# Patient Record
Sex: Male | Born: 1935 | Race: White | Hispanic: No | Marital: Married | Smoking: Former smoker
Health system: Southern US, Community
[De-identification: ages and names within clinical notes are randomized; demographics above are authoritative.]

## PROBLEM LIST (undated history)

## (undated) ENCOUNTER — Inpatient Hospital Stay: Admission: EM | Payer: Self-pay | Source: Home / Self Care

## (undated) DIAGNOSIS — I2510 Atherosclerotic heart disease of native coronary artery without angina pectoris: Secondary | ICD-10-CM

## (undated) DIAGNOSIS — I4891 Unspecified atrial fibrillation: Secondary | ICD-10-CM

## (undated) DIAGNOSIS — IMO0002 Reserved for concepts with insufficient information to code with codable children: Secondary | ICD-10-CM

## (undated) DIAGNOSIS — I10 Essential (primary) hypertension: Secondary | ICD-10-CM

## (undated) DIAGNOSIS — E785 Hyperlipidemia, unspecified: Secondary | ICD-10-CM

## (undated) HISTORY — DX: Hyperlipidemia, unspecified: E78.5

## (undated) HISTORY — PX: TONSILLECTOMY AND ADENOIDECTOMY: SUR1326

## (undated) HISTORY — DX: Atherosclerotic heart disease of native coronary artery without angina pectoris: I25.10

## (undated) HISTORY — PX: BACK SURGERY: SHX140

## (undated) HISTORY — DX: Essential (primary) hypertension: I10

## (undated) HISTORY — PX: OTHER SURGICAL HISTORY: SHX169

## (undated) HISTORY — PX: APPENDECTOMY: SHX54

## (undated) HISTORY — DX: Reserved for concepts with insufficient information to code with codable children: IMO0002

---

## 1998-03-30 ENCOUNTER — Ambulatory Visit (HOSPITAL_COMMUNITY): Admission: RE | Admit: 1998-03-30 | Discharge: 1998-03-30 | Payer: Self-pay | Admitting: Internal Medicine

## 1999-07-29 ENCOUNTER — Encounter: Admission: RE | Admit: 1999-07-29 | Discharge: 1999-07-29 | Payer: Self-pay | Admitting: Internal Medicine

## 1999-07-29 ENCOUNTER — Encounter: Payer: Self-pay | Admitting: Internal Medicine

## 1999-09-25 HISTORY — PX: CORONARY ARTERY BYPASS GRAFT: SHX141

## 2000-02-06 ENCOUNTER — Ambulatory Visit (HOSPITAL_COMMUNITY): Admission: RE | Admit: 2000-02-06 | Discharge: 2000-02-06 | Payer: Self-pay | Admitting: Cardiology

## 2000-02-16 ENCOUNTER — Encounter: Payer: Self-pay | Admitting: Thoracic Surgery (Cardiothoracic Vascular Surgery)

## 2000-02-20 ENCOUNTER — Encounter: Payer: Self-pay | Admitting: Thoracic Surgery (Cardiothoracic Vascular Surgery)

## 2000-02-20 ENCOUNTER — Inpatient Hospital Stay (HOSPITAL_COMMUNITY)
Admission: RE | Admit: 2000-02-20 | Discharge: 2000-02-25 | Payer: Self-pay | Admitting: Thoracic Surgery (Cardiothoracic Vascular Surgery)

## 2000-02-21 ENCOUNTER — Encounter: Payer: Self-pay | Admitting: Thoracic Surgery (Cardiothoracic Vascular Surgery)

## 2000-02-22 ENCOUNTER — Encounter: Payer: Self-pay | Admitting: Thoracic Surgery (Cardiothoracic Vascular Surgery)

## 2000-02-24 ENCOUNTER — Encounter: Payer: Self-pay | Admitting: Thoracic Surgery (Cardiothoracic Vascular Surgery)

## 2002-04-24 ENCOUNTER — Ambulatory Visit (HOSPITAL_COMMUNITY): Admission: RE | Admit: 2002-04-24 | Discharge: 2002-04-24 | Payer: Self-pay | Admitting: *Deleted

## 2002-08-23 ENCOUNTER — Emergency Department (HOSPITAL_COMMUNITY): Admission: EM | Admit: 2002-08-23 | Discharge: 2002-08-23 | Payer: Self-pay | Admitting: Emergency Medicine

## 2002-09-14 ENCOUNTER — Encounter: Payer: Self-pay | Admitting: Internal Medicine

## 2002-09-14 ENCOUNTER — Encounter: Admission: RE | Admit: 2002-09-14 | Discharge: 2002-09-14 | Payer: Self-pay | Admitting: Internal Medicine

## 2002-11-13 ENCOUNTER — Encounter: Payer: Self-pay | Admitting: Neurosurgery

## 2002-11-16 ENCOUNTER — Encounter: Payer: Self-pay | Admitting: Neurosurgery

## 2002-11-16 ENCOUNTER — Inpatient Hospital Stay (HOSPITAL_COMMUNITY): Admission: RE | Admit: 2002-11-16 | Discharge: 2002-11-17 | Payer: Self-pay | Admitting: Neurosurgery

## 2005-09-04 ENCOUNTER — Encounter: Payer: Self-pay | Admitting: Cardiovascular Disease

## 2008-12-23 ENCOUNTER — Encounter: Admission: RE | Admit: 2008-12-23 | Discharge: 2009-01-25 | Payer: Self-pay | Admitting: Rheumatology

## 2009-01-21 ENCOUNTER — Inpatient Hospital Stay (HOSPITAL_COMMUNITY): Admission: AD | Admit: 2009-01-21 | Discharge: 2009-01-23 | Payer: Self-pay | Admitting: Internal Medicine

## 2009-06-20 ENCOUNTER — Encounter: Payer: Self-pay | Admitting: Cardiovascular Disease

## 2010-10-18 ENCOUNTER — Ambulatory Visit
Admission: RE | Admit: 2010-10-18 | Discharge: 2010-10-18 | Payer: Self-pay | Source: Home / Self Care | Attending: Cardiovascular Disease | Admitting: Cardiovascular Disease

## 2010-10-18 ENCOUNTER — Encounter: Payer: Self-pay | Admitting: Cardiovascular Disease

## 2010-10-18 DIAGNOSIS — I25810 Atherosclerosis of coronary artery bypass graft(s) without angina pectoris: Secondary | ICD-10-CM | POA: Insufficient documentation

## 2010-10-18 DIAGNOSIS — E785 Hyperlipidemia, unspecified: Secondary | ICD-10-CM | POA: Insufficient documentation

## 2010-10-18 DIAGNOSIS — I10 Essential (primary) hypertension: Secondary | ICD-10-CM | POA: Insufficient documentation

## 2010-10-26 NOTE — Assessment & Plan Note (Signed)
Summary: np6/previous pt of Dr. Aleen Campi   Visit Type:  Initial Consult Primary Provider:  Dr. Pearson Grippe  CC:  Previous pt. of Dr. Aleen Campi- Cardiology evaluation.  History of Present Illness: 77 year-old male with CAD s/p CABG in 2001. The patient is presenting for initial evaluation. He is former pt of Dr Aleen Campi but hasn't been seen in several years.   He was initially diagnosed with CAD when he presented with crescendo angina, first noted when he was hunting. He had an abnormal stress test followed by catheterization showing severe multivessel disease. He underwent 4 vessel CABG in 2001 and has not further ischemic events.   He is inactive, but still enjoys going hunting and fishing. He states many of the younger guys can't keep up with him on their hunting trips.  The patient denies chest pain, dyspnea, orthopnea, PND, edema, palpitations, lightheadedness, or syncope. He is compliant with his medical program as directed by Dr Selena Batten.   Current Medications (verified): 1)  Lovastatin 40 Mg Tabs (Lovastatin) .... Take One Tablet By Mouth Daily At Bedtime 2)  Metoprolol Succinate 50 Mg Xr24h-Tab (Metoprolol Succinate) .... Take 1/2 Tablet Two Times A Day 3)  Aspirin 81 Mg Tbec (Aspirin) .... Take One Tablet By Mouth Daily 4)  Losartan Potassium 50 Mg Tabs (Losartan Potassium) .... Take 1 Tablet By Mouth Once A Day 5)  Symbicort 160-4.5 Mcg/act Aero (Budesonide-Formoterol Fumarate) .... 2 Puffs Two Times A Day 6)  Vitamin B-12 1000 Mcg Tabs (Cyanocobalamin) .... Take 1 Tablet By Mouth Once A Day  Allergies (verified): No Known Drug Allergies  Past History:  Past medical, surgical, family and social histories (including risk factors) reviewed, and no changes noted (except as noted below).  Past Medical History: Past Medical History: Hypertension Hyperlipidemia Coronary artery disease s/p CABG Asthma  Cervical degenerative disk disease/spinal stenosis.   Past Surgical History:  1. CABG in 2001 after positive exercise treadmill test. LIMA-LAD, Radial artery to LCx marginal, SVG to second diagonal, SVG to PDA.   2. Bilateral inguinal hernia repair.   3. Back surgery for herniated disk.   4. T and A.   5. Appendectomy.   Family History: Reviewed history and no changes required. Father died at age 34 from heart attack.  His mother is   alive at age 50 and has dementia.  He has 1 brother and 1 sister, both  of whom are healthy.  He has 3 children.   Social History: Reviewed history and no changes required. The patient is married and worked for [employer].  He does not drink or smoke.   Review of Systems       Negative except as per HPI   Vital Signs:  Patient profile:   77 year old male Height:      70 inches Weight:      158 pounds BMI:     22.75 Pulse rate:   71 / minute Pulse rhythm:   regular Resp:     18 per minute BP sitting:   160 / 80  (left arm) Cuff size:   large  Vitals Entered By: Vikki Ports (October 18, 2010 2:28 PM)  Physical Exam  General:  Pt is well-developed, alert and oriented, no acute distress HEENT: normal Neck: no thyromegaly           JVP normal, carotid upstrokes normal without bruits Lungs: CTA Chest: equal expansion  CV: Apical impulse nondisplaced, RRR without murmur or gallop Abd: soft, NT,  positive BS, no HSM, no bruit Back: no CVA tenderness Ext: no clubbing, cyanosis, or edema        femoral pulses 2+ without bruits        pedal pulses 2+ and equal Skin: warm, dry, no rash Neuro: CNII-XII intact,strength 5/5 = b/l    Cardiac Cath  Procedure date:  02/06/2000  Findings:      Findings:      FINAL DIAGNOSES: 1. Three-vessel coronary artery disease with 95% proximal circumflex lesion,    a 90% mid left anterior descending artery lesion, a 90% distal right    coronary artery lesion in the first posterolateral branch to the left    ventricle. 2. Normal left internal mammary artery. 3.  Normal left ventricular function. 4. Normal mitral and aortic valves. 5. Normal abdominal aorta and renal arteries.  EKG  Procedure date:  10/18/2010  Findings:      NSR 71 bpm, within normal limits.  Impression & Recommendations:  Problem # 1:  CORONARY ATHEROSLERO AUTOL VEIN BYPASS GRAFT (ICD-414.02) Pt is stable without angina. Meds reviewed and are appropriate. He reports good BP control and states that he was anxious about being here for his first visit today. He has preserved LV function. Since he is 10 years removed from multivessel CABG and has a normal EKG, I ahve recommended an exercise treadmill stress test. Otherwise will see back in followup at one year intervals.  His updated medication list for this problem includes:    Metoprolol Succinate 50 Mg Xr24h-tab (Metoprolol succinate) .Marland Kitchen... Take 1/2 tablet two times a day    Aspirin 81 Mg Tbec (Aspirin) .Marland Kitchen... Take one tablet by mouth daily  Orders: EKG w/ Interpretation (93000) Treadmill (Treadmill)  Problem # 2:  HYPERTENSION, BENIGN (ICD-401.1) Discussion as above. No med changes today. Continue regular followup with Dr Selena Batten and meds can be titrated as needed.  His updated medication list for this problem includes:    Metoprolol Succinate 50 Mg Xr24h-tab (Metoprolol succinate) .Marland Kitchen... Take 1/2 tablet two times a day    Aspirin 81 Mg Tbec (Aspirin) .Marland Kitchen... Take one tablet by mouth daily    Losartan Potassium 50 Mg Tabs (Losartan potassium) .Marland Kitchen... Take 1 tablet by mouth once a day  BP today: 160/80  Problem # 3:  HYPERLIPIDEMIA-MIXED (ICD-272.4) Goal LDL less than 100 mg/dL and ideally less than 70.  His updated medication list for this problem includes:    Lovastatin 40 Mg Tabs (Lovastatin) .Marland Kitchen... Take one tablet by mouth daily at bedtime  Patient Instructions: 1)  Your physician recommends that you continue on your current medications as directed. Please refer to the Current Medication list given to you today. 2)  Your  physician wants you to follow-up in:  1 YEAR.  You will receive a reminder letter in the mail two months in advance. If you don't receive a letter, please call our office to schedule the follow-up appointment. 3)  Your physician has requested that you have an exercise tolerance test.  For further information please visit [email or web address]  Please also follow instruction sheet, as given.

## 2010-11-14 ENCOUNTER — Encounter: Payer: Self-pay | Admitting: Physician Assistant

## 2010-11-14 ENCOUNTER — Encounter (INDEPENDENT_AMBULATORY_CARE_PROVIDER_SITE_OTHER): Payer: Medicare Other | Admitting: Physician Assistant

## 2010-11-14 ENCOUNTER — Encounter (INDEPENDENT_AMBULATORY_CARE_PROVIDER_SITE_OTHER): Payer: Medicare Other

## 2010-11-14 DIAGNOSIS — I2510 Atherosclerotic heart disease of native coronary artery without angina pectoris: Secondary | ICD-10-CM

## 2010-11-14 DIAGNOSIS — R0989 Other specified symptoms and signs involving the circulatory and respiratory systems: Secondary | ICD-10-CM

## 2010-12-05 NOTE — Letter (Signed)
Summary: [facility] Treadmill Test   [facility] Treadmill [facility]   Imported By: Roderic Ovens 11/24/2010 10:35:42  _____________________________________________________________________  External Attachment:    Type:   Image     Comment:   External Document

## 2011-01-03 LAB — CBC
HCT: 38.9 % — ABNORMAL LOW (ref 39.0–52.0)
Hemoglobin: 13.6 g/dL (ref 13.0–17.0)
MCHC: 34.9 g/dL (ref 30.0–36.0)
MCV: 95.7 fL (ref 78.0–100.0)
Platelets: 230 10*3/uL (ref 150–400)
RBC: 4.06 MIL/uL — ABNORMAL LOW (ref 4.22–5.81)
RDW: 12.6 % (ref 11.5–15.5)
WBC: 11.8 10*3/uL — ABNORMAL HIGH (ref 4.0–10.5)

## 2011-01-03 LAB — COMPREHENSIVE METABOLIC PANEL
ALT: 26 U/L (ref 0–53)
AST: 21 U/L (ref 0–37)
Albumin: 3.3 g/dL — ABNORMAL LOW (ref 3.5–5.2)
Alkaline Phosphatase: 68 U/L (ref 39–117)
BUN: 20 mg/dL (ref 6–23)
CO2: 29 mEq/L (ref 19–32)
Calcium: 9.3 mg/dL (ref 8.4–10.5)
Chloride: 100 mEq/L (ref 96–112)
Creatinine, Ser: 1.14 mg/dL (ref 0.4–1.5)
GFR calc Af Amer: >60 mL/min (ref >60)
GFR calc non Af Amer: >60 mL/min (ref >60)
Glucose, Bld: 104 mg/dL — ABNORMAL HIGH (ref 70–99)
Potassium: 4.7 mEq/L (ref 3.5–5.1)
Sodium: 139 mEq/L (ref 135–145)
Total Bilirubin: 1.1 mg/dL (ref 0.3–1.2)
Total Protein: 6.8 g/dL (ref 6.0–8.3)

## 2011-01-03 LAB — WOUND CULTURE

## 2011-01-03 LAB — SEDIMENTATION RATE: Sed Rate: 88 mm/hr — ABNORMAL HIGH (ref 0–16)

## 2011-01-03 LAB — C-REACTIVE PROTEIN: CRP: 9.4 mg/dL — ABNORMAL HIGH (ref <0.6)

## 2011-02-06 NOTE — Discharge Summary (Signed)
Shawn Bauer, Shawn Bauer                ACCOUNT NO.:  [id number]   MEDICAL RECORD NO.:  [id number]          PATIENT TYPE:  INP   LOCATION:  5014                         FACILITY:  [facility]   PHYSICIAN:  Massie Maroon, MD        DATE OF BIRTH:  28-Dec-1933   DATE OF ADMISSION:  01/21/2009  DATE OF DISCHARGE:  01/23/2009                               DISCHARGE SUMMARY   DISCHARGE DIAGNOSES:  1. Upper lip abscess, staph aureus, cultures and sensitivity still      pending.  2. Hypertension.  3. Hyperlipidemia.  4. Coronary artery disease.  5. Asthma.   CONSULTATIONS:  Suzanna Obey, MD, ENT   PROCEDURES:  Incision and drainage of the upper lip in Dr. Jearld Fenton'  office, January 21, 2009.   HOSPITAL COURSE:  A 77 year old male with a history of asthma and CAD  complained of lip swelling starting on Friday.  It apparently started  out as a fever blister.  The patient recalled no odd foods eaten such as  shell fish, strawberries, no new medications such as a ACE inhibitors.  The patient denied any fever, chills, sinus pain, dental work, chest  pain, palpitations, cough, or shortness of breath.  The swelling which  started approximately January 14, 2009 was evaluated in the office on  January 19, 2009 and he was found to have swelling of his upper lip which  appeared possibly to have a slight abscess of the lip.  The patient was  started on Augmentin 875 mg p.o. b.i.d. and it did not improve.  So, he  was seen in the office again on January 21, 2009 and sent to ENT for  evaluation of possible upper lip abscess.  He had an I and D at Dr.  Jearld Fenton' office.  The abscess appeared suspicious for MRSA infection and  the patient was sent to the hospital for IV antibiotics.  The patient  was started on vancomycin as well as Zosyn intravenously and the redness  and swelling of the upper lip started to recede.  On the day of  discharge, it looked significantly better.  There was almost no erythema  and the swelling has  decreased by 75%.  The patient has not had any  fevers or chills.  His vital signs have been stable.  The patient's  wound culture is still pending, but because of his significant  improvement he will be discharged home on doxycycline 100 mg p.o. b.i.d.   PAST MEDICAL HISTORY:  1. Hypertension.  2. Hyperlipidemia.  3. CAD status post CABG.  4. Cervical degenerative disk disease/spinal stenosis.  5. Asthma.  6. History of bilateral cerumen impaction (recurrent).   PAST SURGICAL HISTORY:  1. CABG in 2001 after positive exercise treadmill test.  2. Bilateral inguinal hernia repair.  3. Back surgery for herniated disk.  4. T and A.  5. Appendectomy.   FAMILY HISTORY/SOCIAL HISTORY:  Please see H and P dictated on January 21, 2009.   REVIEW OF SYSTEMS:  Negative for all 10 organ systems except for  pertinent positives expressed above.  ALLERGIES:  No known drug allergies.   DISCHARGE MEDICATIONS:  1. Metoprolol 50 mg one-half b.i.d.  2. Lovastatin 40 mg 2 at bedtime.  3. Enteric-coated aspirin 81 mg p.o. daily.  4. Micardis 40 mg p.o. daily.  5. Singulair 10 mg p.o. daily.  6. Symbicort 160/4.5 two puffs b.i.d. p.r.n.   PHYSICAL EXAMINATION:  VITAL SIGNS:  Temperature 98.2, pulse 71, blood  pressure 121/77, and pulse ox 93% on room air.  HEENT:  Anicteric, EOMI, no nystagmus, pupils 1.5 mm, symmetric,  swelling of the upper lip has receded by about 75%.  The erythema is  almost completely resolved.  There is a scab on the lower part of the  upper lip from his incision and drainage.  Pain is minimal to none at  this time.  There is almost no tenderness.  NECK:  No JVD, no bruit, no adenopathy.  HEART:  Regular rate and rhythm.  S1-S2.  LUNGS:  Clear to auscultation bilaterally.  ABDOMEN:  Soft, nontender, nondistended, positive bowel sounds.  EXTREMITIES:  No cyanosis, clubbing, or edema.  SKIN:  See above HEENT, otherwise no rashes, lymph nodes, no cervical  adenopathy.   NEUROLOGIC:  Nonfocal.   LABORATORY DATA:  Wound culture January 21, 2009 shows moderate  Staphylococcus aureus, sensitivities are not back yet.  Labs January 21, 2009:  WBC 11.8 (elevated), hemoglobin 13.6, and platelet count 230.  Sodium 139, potassium 4.7, BUN 20, and creatinine 1.14.  AST 21, ALT 26,  ESR 88 (elevated), and CRP 9.4.   ASSESSMENT:  1. Upper lip abscess, probable methicillin-resistant Staphylococcus      aureus.  2. Hypertension.  3. Hyperlipidemia.  4. Coronary artery disease status post coronary artery bypass graft.  5. Asthma.   PLAN:  The patient will be discharged with doxycycline 100 mg p.o.  b.i.d. x1 week.  The patient will follow up with Dr. Jearld Fenton in 1 week.  He will follow up with Dr. Selena Batten in 1-2 weeks.  The patient is asked to  contact us in the office tomorrow for the results of his culture and  sensitivities.  We will adjust his antibiotics depending on the wound  culture.  The patient's upper lip abscess appears much improved.  We  appreciate Dr. Jearld Fenton' input on this case.      Massie Maroon, MD  Electronically Signed     JYK/MEDQ  D:  01/23/2009  T:  01/24/2009  Job:  [id number]   cc:   Suzanna Obey, M.D.

## 2011-02-06 NOTE — H&P (Signed)
NAMEJASTON, Shawn Bauer                ACCOUNT NO.:  [id number]   MEDICAL RECORD NO.:  [id number]          PATIENT TYPE:  INP   LOCATION:  5014                         FACILITY:  [facility]   PHYSICIAN:  Massie Maroon, MD        DATE OF BIRTH:  June 05, 1934   DATE OF ADMISSION:  01/21/2009  DATE OF DISCHARGE:                              HISTORY & PHYSICAL   CHIEF COMPLAINT:  Swollen lip.   HISTORY OF PRESENT ILLNESS:  A 77 year old male with a history of asthma  and CAD complains of lip swelling starting on Friday.  It started out as  a fever blister.  The patient recalls no odd foods or shellfish or  strawberries or no new medications such as ACE inhibitors.  The patient  denies any fever, chills, sinus pain, chest pain, palpitation, or  shortness of breath.  Little swelling started approximately April 23.  The patient was evaluated in the office on April 28 and was found to  have swelling of the upper lip and appeared possibly to have a slight  abscess in the lip.  The patient was started on Augmentin 875 mg p.o.  b.i.d. and told to contact his primary care physician if it did not  improve.  Today, the patient was seen in the office again because his  lip swelling was not improving.  There appeared to be a definite abscess  and it is suspicious for MRSA infection.  The patient was sent to ENT  for evaluation.  Apparently, the patient had an I and D in the office  after nerve block.  A culture was done in the office of his PCP.  The  patient will be sent for admission for IV antibiotics for possible MRSA  abscess of the upper lip.   PAST MEDICAL HISTORY:  1. Hypertension.  2. Hyperlipidemia.  3. CAD status post CABG.  4. Cervical degenerative disk disease/spinal stenosis.  5. Bilateral cerumen impaction (recurrent problem).  6. Asthma.   PAST SURGICAL HISTORY:  1. CABG in 2001 after positive exercise treadmill test.  2. Bilateral inguinal hernia repair.  3. Back surgery for herniated  disk.  4. T and A.  5. Appendectomy.   FAMILY HISTORY:  Father died at age 58 from heart attack.  His mother is  alive at age 20 and has dementia.  He has 1 brother and 1 sister, both  of whom are healthy.  He has 3 children.   SOCIAL HISTORY:  The patient is married and worked for [employer].  He does not drink or smoke.   ALLERGIES:  No known drug allergies.   MEDICATIONS:  1. Metoprolol 50 mg one-half b.i.d.  2. Lovastatin 40 mg 2 at bedtime.  3. Symbicort 160/4.5 two puffs q.a.m. p.r.n.  4. Enteric-coated aspirin 81 mg p.o. daily.  5. Micardis 40 mg p.o. daily.  6. Singulair 10 mg p.o. daily.   REVIEW OF SYSTEMS:  Negative for all 10-organ systems except for  pertinent positives stated above, no recent travel, no recent  hospitalization, no recent nursing home exposure or  increased risk for  MRSA infection.   PHYSICAL EXAMINATION:  VITAL SIGNS:  Weight 155 pounds, pulse 76, blood  pressure 110/68 in the office, and pulse oximetry 97% on room air.  HEENT:  Anicteric, EOMI, no nystagmus, pupils 1.5 mm, symmetric, direct,  consensual, near reflexes intact.  There is swelling of the upper lip  which appears to have slight abscess and maybe slightly larger than  before when the patient was seen on January 19, 2009.  It is draining  yellow discharge (this was the description in the office).  In the  hospital, the patient has obviously had incision and drainage with  packing of the upper lip.  NECK:  No JVD, no bruit, no adenopathy, no thyromegaly.  HEART:  Midline scar.  Regular rate and rhythm.  S1 and S2.  No murmurs,  gallops, or rubs.  LUNGS:  Clear to auscultation bilaterally.  ABDOMEN:  Soft, nontender, and nondistended.  Positive bowel sounds.  EXTREMITIES:  No cyanosis, clubbing, or edema.  SKIN:  No rashes.  NEURO:  No resting tremor.  Cranial nerves II through XII intact.  Reflexes 2+, symmetric, diffuse with downgoing toes bilaterally.  Motor  strength  5/5 in all 4 extremities.  Pinprick intact.   LABORATORY DATA:  Sodium 139, potassium 4.7, chloride 100, bicarb 29,  BUN 20, creatinine 1.14, AST 21, ALT 26, albumin 3.3, and calcium 9.3.  WBC 11.8, hemoglobin 13.6, and platelet count 230.   ASSESSMENT:  1. Upper lip abscess.  2. Hypertension.  3. Hyperlipidemia.  4. Coronary artery disease status post coronary artery bypass graft.  5. Asthma.   PLAN:  1. Lip abscess:  We appreciate out ENT colleagues input.  ENT consult      written for further evaluation and followup management of lipid      abscess.  The patient will be started on vancomycin as per pharmacy      protocol.  We will also cover with Zosyn 3.375 g IV q.6 h for now.      We will obtain another wound Gram stain and culture prior to      initiation of antibiotics.  The patient is afebrile and has minimal      white count, so we will not obtain blood cultures at this time.  He      does not appear to be septic.  A sed rate and CRP level are      obtained as well.  If the patient does not improve, he may need      further surgery as well as imaging.  2. CAD status post CABG:  The patient will continue on metoprolol 25      mg p.o. b.i.d. as well as enteric-coated aspirin 81 mg daily and      Micardis 40 mg p.o. daily.  For his cholesterol, he will continue      on lovastatin 80 mg at bedtime.  3. Asthma:  The patient will be continued on Symbicort 2 puffs b.i.d.      p.r.n. wheeze as well as Singulair 10 mg p.o. daily.  Albuterol neb      q.4 h p.r.n. wheezing or shortness of breath.  The patient had a      pulse oximetry that was noted to be 84% on room air apparently, but      I have rechecked this with a pulse oximeter manually myself      and his pulse oximetry on room air is 97%.  4. DVT prophylaxis:  The patient will be placed on SCDs and TEDs for      now in case further surgery is required tomorrow.      Massie Maroon, MD  Electronically Signed      JYK/MEDQ  D:  01/21/2009  T:  01/22/2009  Job:  [id number]   cc:   Massie Maroon, MD  Suzanna Obey, M.D.

## 2011-02-09 NOTE — Discharge Summary (Signed)
[facility]  Patient:    Shawn Bauer, Shawn Bauer                       MRN: [id number] Adm. Date:  [id number] Disc. Date: [id number] Attending:  Tressie Stalker Dictator:   Eugenia Pancoast, P.A. CC:         Shawn Bauer. Shawn Bauer., M.D.             Shawn Bauer, M.D.             Shawn Bauer, M.D.                           Discharge Summary  DATE OF BIRTH:  12/11/1933  FINAL DIAGNOSES: 1. Coronary artery disease. 2. Hypertension. 3. Asthma. 4. Degenerative disk disease. 5. History of stroke. 6. Postoperative dysrhythmia. 7. Hyperlipidemia.  PROCEDURES:  Feb 20, 2000, coronary artery bypass graft x 4 with the left internal mammary artery to the left anterior descending, left radial artery to the obtuse marginal branch and saphenous vein graft to the diagonal branch, saphenous vein graft to the posterior descending artery.  SURGEON:  Shawn Bauer, M.D.  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  He is followed by Dr. Dimas Alexandria and was referred by Dr. Charolette Child to Dr. Cornelius Bauer for surgical revascularization. Shawn Bauer has no previous history of coronary artery disease.  He does have risk factors including hypertension, previous stroke, and remote history of tobacco use.  Shawn Bauer describes a one-year history of progressive symptoms of exertional angina.  He notes occasional episodes of a tightness in his chest which initially were brought on with significant physical exertion.  His episodes of chest pain are usually relieved with rest and only last a few minutes at most.  He denies any history of prolonged episodes of chest pain. The episodes continued to increase in frequency and severity and a month prior to his admission, he suffered one such episode occurring at night while he was lying in bed.  He recently had antihypertensive medication readjusted by Dr.  Lendell Caprice and, since that time, the frequency of episodes of angina has decreased considerably.  Patient underwent an exercise treadmill by Dr. Lendell Caprice and was notably positive by report.  The details of these findings were not available at this time.  He was referred then to Dr. Aleen Bauer who performed elective cardiac catheterization on Feb 06, 2000. This demonstrates severe three-vessel coronary artery disease with preserved left ventricular function.  Specifically, there was a long 70% stenosis with eccentric 90% stenosis of the left anterior descending coronary artery in its mid portion involving the takeoff of the second diagonal branch.  The first diagonal branch was quite small but the second was a significant vessel and also had an 80% proximal stenosis.  There was a 95% proximal stenosis of the left circumflex coronary artery.  The right coronary artery was diffusely diseased but had no significant hemodynamic lesions until after his bifurcation.  There was a 90% proximal stenosis of the posterior descending coronary artery.  There was a right dominant coronary circulation.  Left ventricular function appeared well preserved with an ejection fraction estimated at 64%.  Because of these findings, he was referred to Dr. Cornelius Bauer who saw him in the office.  Dr. Cornelius Bauer discussed the surgery with the patient and risks and benefits of  the surgery were explained to the patient.  Patient understood and agreed to the surgery.  HOSPITAL COURSE:  On Feb 20, 2000, patient was admitted to [facility].  On day of admission, patient underwent coronary artery bypass x 4 with the LIMA to the LAD, left radial artery to the obtuse marginal branch, saphenous vein graft to the diagonal artery, and saphenous vein graft to the posterior descending artery.  Patient tolerated the procedure well.  No intraoperative complications occurred.  Postoperatively, patient did satisfactorily.  He was extubated on  the operative day.  On the first postoperative day, he was doing quite well, having no complaints.  He was in normal sinus rhythm.  His blood pressure was 130/60.  He was afebrile.  Vital signs were stable.  He continued to progress in a satisfactory manner.  On the first postoperative day, he was transferred to the step-down unit.  There, he continued to progress in a satisfactory manner.  He did have some nausea and was given Zofran for this and this resolved.  The patient did have an episode occurring on the third postoperative day at which time he had noted supraventricular tachycardia with frequent PACs.  This lasted only a short time.  But, because of this occurrence, he was started on digoxin.  This would be discontinued at time of discharge if rhythm remained stable which it has. Otherwise, no other untoward events occurred during his stay.  Prior to his admission, he did have a lipid profile which showed a cholesterol was elevated at 216, his triglycerides were 81, his HDL was 38, and his LDL cholesterol was 162.  Because of this, he was started on Lipitor 10 mg h.s.  His final lab work showed a sodium of 135, potassium 4.4, chloride 101, CO2 30, BUN 17, creatinine 0.8, glucose 165.  His calcium was 8.1 and his magnesium was 2.2. His white cell count was 13.4, his hemoglobin 9.3, hematocrit 26.3, and his platelet count 161.  His glucose was noted to be high.  He had no history of diabetes but patient will be followed for further tests of elevated glucose to rule out diabetes.  Patient was doing quite well and was subsequently prepared for discharge.  DISCHARGE MEDICATIONS: 1. Lipitor 10 mg h.s. 2. Cardizem CD 180 mg q.d. 3. Diovan 80 mg q.d. 4. Lopressor 50 mg one-half tablet q.12h. 5. Enteric-coated aspirin 325 mg q.d. 6. Serevent inhaler two puffs q.12h. 7. Ventolin inhaler two puffs b.i.d. 8. Singulair 10 mg q.d. 9. Percocet one to two p.o. q.4-6h. p.r.n.  pain.  FOLLOW-UP:  Patient is told to follow up with Dr. Aleen Bauer in two weeks.  He  will see Dr. Cornelius Bauer in three weeks.  CONDITION ON DISCHARGE:  Patient is subsequently discharged home in satisfactory and stable condition on June March 2001. DD:  02/24/00 TD:  02/27/00 Job: [id number]

## 2011-02-09 NOTE — Op Note (Signed)
NAME:  Shawn Bauer, Shawn Bauer                          ACCOUNT NO.:  [id number]   MEDICAL RECORD NO.:  [id number]                   PATIENT TYPE:  INP   LOCATION:  3172                                 FACILITY:  [facility]   PHYSICIAN:  Hewitt Shorts, M.D.            DATE OF BIRTH:  [birth year]   DATE OF PROCEDURE:  11/16/2002  DATE OF DISCHARGE:                                 OPERATIVE REPORT   PREOPERATIVE DIAGNOSIS:  Right L3-4 lumbar disk herniation.   POSTOPERATIVE DIAGNOSIS:  Right L3-4 lumbar disk herniation.   PROCEDURE:  Right L3 semi-laminotomy and microdiskectomy.   SURGEON:  Hewitt Shorts, M.D.   ASSISTANT:  __________.   ANESTHESIA:  General endotracheal.   INDICATIONS FOR PROCEDURE:  The patient is a 77 year old man who presented  with a right lumbar radiculopathy with right iliopsoas weakness.  He was  found to have a right L3-4 lumbar disk herniation with a fragment that had  migrated rostrally behind the body of L3.  The decision was made to proceed  with an elective laminotomy and microdiskectomy.   DESCRIPTION OF PROCEDURE:  The patient was brought to the operating room and  placed under general endotracheal anesthesia.  The patient was turned to the  prone position.  The lumbar region was prepped with Betadine soap and  solution and draped in a sterile fashion.  A midline was infiltrated with  local anesthetic with epinephrine and x-ray was taken, and the L3-4 level  was identified.  In the midline an incision was made and carried down  through the subcutaneous tissue.  Bipolar cautery and electrocautery were  used to main hemostasis.  Dissection was carried down to the lumbar fascia  which was incised on the right side of the midline in the paraspinal  muscles, dissecting the spinous process and lamina in a subperiosteal  fashion.  The L3-4 interlaminar space was identified.  An x-ray was taken to  confirm the localization, and then the microscope was  draped and brought  into the field to provide magnification, illumination and visualization, and  the remainder of the procedure was performed using microdissection and  microsurgical technique.  A right L3 hemi-laminotomy was performed using the  [facility] Max drill and Kerrison punches.  The ligament of flavum was carefully  resected and the thecal sac and right L3 and right L4 nerve roots were  identified.  The L3-4 disk space was identified.  There was a large disk  herniation with fragment migrating beneath the posterior longitudinal  ligament rostrally, extending into the axilla of the L3 nerve root, where a  fragment was encountered as well.  The remaining annular fibers were  incised, and a diskectomy performed, removing extensive amounts of  degenerated disk material from the disk space.  The disk herniation within  the annulus and posterior longitudinal ligament, as well as the fragment  that had migrated rostrally behind the  body of L3 and into the L3 nerve  roots axilla.  As the diskectomy was performed, we were able to decompress  the thecal sac and nerve roots.  A third dissection was performed, with the  removal of all loose fragments of disk material.  Once the diskectomy was  completed, hemostasis was obtained, using both Gelfoam and thrombin and  bipolar cautery.  Once the hemostasis was fully established, the wound was  irrigated several times with kanamycin solution, and then 2 mL of fentanyl,  80 mg of fentanyl were infused into the epidural space and then the wound  closed in multiple layers.  The deep fascia was closed with interrupted  undyed #1 Vicryl suture.  The subcutaneous tissues and subcuticular were  closed with interrupted inverted #2-0 undyed Vicryl sutures, the skin  approximated with DermaBond.  The patient tolerated the procedure well.  The estimated blood loss was 75  mL.  The sponge and needle counts were correct.  Following surgery, the  patient was  turned back to the supine position, to be reversed from  anesthesia, extubated, and transferred to the recovery room for future care.                                               Hewitt Shorts, M.D.    RWN/MEDQ  D:  11/16/2002  T:  11/16/2002  Job:  [id number]

## 2011-02-09 NOTE — Op Note (Signed)
   Shawn Bauer, Shawn Bauer                         ACCOUNT NO.:  [id number]   MEDICAL RECORD NO.:  [id number]                   PATIENT TYPE:  AMB   LOCATION:  ENDO                                 FACILITY:  [facility]   PHYSICIAN:  Georgiana Spinner, M.D.                 DATE OF BIRTH:  [birth year]   DATE OF PROCEDURE:  DATE OF DISCHARGE:  04/24/2002                                 OPERATIVE REPORT   PROCEDURE:  Upper endoscopy.   INDICATIONS FOR PROCEDURE:  GERD.   ANESTHESIA:  Demerol 60, Versed 6 mg.   DESCRIPTION OF PROCEDURE:  With the patient adequately sedated in the left  lateral decubitus position, the Olympus videoscopic endoscope inserted in  the mouth, passed under direct vision through the esophagus, which appeared  normal, into the stomach.  Fundus body, antrum appeared normal.  Duodenal  bulb, second portion of the duodenum appeared normal.  From this point, the  endoscope was slowly withdrawn, taking circumferential views of the entire  duodenal mucosa until the endoscope had been pulled back into the stomach,  placed in retroflexion to view the stomach from below.  The endoscope was  then straightened and withdrawn, taking circumferential views of the entire  gastric and esophageal mucosa.  The patient's vital signs and pulse oximeter  remained stable.  The patient tolerated the procedure well without apparent  complications.   FINDINGS:  Unremarkable examination.   PLAN:  Proceed to colonoscopy as planned.                                               Georgiana Spinner, M.D.    GMO/MEDQ  D:  04/24/2002  T:  04/29/2002  Job:  [id number]

## 2011-02-09 NOTE — Op Note (Signed)
[place]. [facility]  Patient:    Shawn Bauer, Shawn Bauer                       MRN: [id number] Proc. Date: 02/20/00 Adm. Date:  [id number] Attending:  Tressie Stalker CC:         Aram Candela. Aleen Campi, M.D.             Dr. Daine Gip             CVTS office                           Operative Report  PREOPERATIVE DIAGNOSIS:  Severe three-vessel coronary artery disease with class 3 unstable angina.  POSTOPERATIVE DIAGNOSIS:  Severe three-vessel coronary artery disease with class 3 unstable angina.  OPERATION PERFORMED:  Median sternotomy for coronary artery bypass grafting x 4 (left internal mammary artery to distal left anterior descending coronary artery, left radial artery to circumflex marginal branch, saphenous vein graft to second diagonal branch, saphenous vein graft to posterior descending coronary artery).  SURGEON:  Salvatore Decent. Cornelius Moras, M.D.  ASSISTANT: 1. Mikey Bussing, M.D. 2. [address]  ANESTHESIA:  General.  INDICATIONS FOR PROCEDURE:  The patient is a 77 year old white male from [place] followed by Dr. Shelva Majestic and referred by Dr. Charolette Child for management of coronary artery disease.  The patient presents with a one year history of progressive symptoms of angina and recently developed unstable symptoms.  He underwent elective cardiac catheterization by Dr. Aleen Campi which demonstrated severe three-vessel coronary artery disease with normal left ventricular function.  OPERATIVE CONSENT:  The patient and his wife were counseled at length regarding the indications and potential benefits of coronary artery bypass grafting.  They understand the associated risks of surgery including but not limited to the risks of death, stroke, myocardial infarction, bleeding requiring blood transfusion, arrhythmia, infection and recurrent coronary artery disease.  They furthermore understand the associated risks and potential benefits of  use of left radial artery as a potential conduit for bypass grafting.  They desire to proceed with the surgery as described.  DESCRIPTION OF PROCEDURE:  The patient was brought to the operating room on the above-mentioned date and invasive hemodynamic monitoring was established by the anesthesia service under the care and direction of Dr. Arta Bruce. The patient was placed in the supine position on the operating table. Following induction of general endotracheal anesthesia, the patients left hand was carefully inspected.  Preoperative noninvasive Doppler studies demonstrate intact palmar arch circulation on the left hand.  The pulse oximetry probe was placed on the patients left index finger.  Subsequently the left radial artery was compressed and the pulse oximetry wave form was monitored.  There maintained a good pulse wave form during compression of the radial artery consistent with intact palmar arch circulation.  The patients chest, abdomen, both groins, and both lower extremities and entire left upper extremity were all prepared and draped in a sterile manner.  The left radial artery was procured for bypass grafting through a longitudinal incision along the volar aspect of the left forearm.  Sharp dissection was utilized and minimal use of electrocautery was employed.  The superficial branch of the left radial nerve was identified and care taken to avoid dissection around the structure and prevent injury.  All branches of the radial artery were divided between hemoclips.  Once the entire left radial artery  was mobilized, the artery was briefly occluded proximally.  A pulse could still be appreciated in the distal portion of the artery.  Subsequently, the artery was divided near its distal end just above the wrist.  The proximal portion of the artery was flushed with heparinized saline solution and irrigated with papaverine solution.  It was carefully inspected for hemostasis.   Subsequently the proximal end was transected.  Both ends of the artery were doubly ligated with suture ligatures.  The arterial graft was placed in room temperature pH balanced solution containing a small amount of papaverine and heparin.  The incision on the left forearm was irrigated with saline solution and inspected for hemostasis.  It was subsequently closed in multiple layers with subcuticular skin closure.  A median sternotomy incision was performed and the left internal mammary artery was dissected from the chest wall and prepared for bypass grafting. The left internal mammary artery was notably good quality conduit for bypass grafting.  Simultaneously, saphenous vein was obtained from the patients left lower leg through a longitudinal incision.  Initially, a small incision was made in the patients right lower leg but this portion of saphenous vein was felt to be too small and insufficient quality.  Subsequently, the saphenous vein utilized for conduit was all obtained from the patients left lower leg. This saphenous vein was felt to be good quality conduit for bypass grafting. The patient was heparinized systemically.  The pericardium was opened.  The ascending aorta was inspected and was notably free of any palpable plaques or calcifications.  The ascending aorta and the right atrial appendage were cannulated for cardiopulmonary bypass.  Adequate heparinization was verified.  Cardiopulmonary bypass was begun and the surface of the heart was inspected. Overall, the heart appeared normal with no significant scar tissue appreciated.  Distal sites were selected for coronary artery bypass grafting. Portions of saphenous vein, the left radial artery and the left internal mammary artery were all trimmed to appropriate lengths.  A temperature probe was placed in the left ventricular septum and the styrofoam pad was placed to protect the left phrenic nerve from thermal injury.  A  cardioplegia catheter was placed in the ascending aorta.   The patient was cooled to 75 degrees systemic temperature.  The aortic crossclamp was applied and cardioplegia was delivered in antegrade fashion through the aortic root.  Additional doses of cardioplegia were administered both through the aortic root and down subsequently placed vein grafts to maintain septal temperature below 15 degrees centigrade throughout the crossclamp portion of the operation.  Iced saline slush was applied for topical hypothermia.  The following distal coronary anastomoses were performed:  (1) The posterior descending coronary artery was grafted with a saphenous vein graft in an end-to-side fashion using running 7-0 Prolene suture. This coronary measured 1.4 mm at the site of the distal bypass.  There was severe atherosclerotic plaque noted in this vessel immediately proximal to the site of the distal anastomosis.  The quality of the vessel at the site of bypass was felt to be of good quality.  (2) The circumflex marginal branch was grafted using the left radial artery in end-to-side fashion using running 8-0 Prolene suture.  This coronary measured 1.6 mm in diameter and was of good quality at the site of distal bypass.  There was a 95% proximal stenosis.  (3) The second diagonal branch off the left anterior descending coronary artery was grafted with the saphenous vein graft in end-to-side fashion using running 7-0 Prolene  suture.  This coronary anastomosis was placed immediately after bifurcation of this vessel at which site there was a severe atherosclerotic plaque.  There was 80 to 90% proximal stenosis.  This coronary was of fair to good quality at the site of distal bypass. It measured 1.3 mm at the site of distal bypass.  (4) The distal left anterior descending coronary artery was grafted with the left internal mammary artery using running 8-0 Prolene suture.  This coronary measured 1.7 mm in  diameter and was of good quality at the site of distal bypass.  There was 90% proximal stenosis.  Both proximal saphenous vein anastomosis and the proximal anastomosis for the left radial artery were all placed directly to the ascending aorta prior to removal of the aortic crossclamp.  The septal temperature was noted to rise rapidly and dramatically upon reperfusion of the left internal mammary artery.  The patient was placed in Trendelenburg position and all blood was evacuated from the aortic root.  The aortic crossclamp was removed after a total crossclamp time of 70 minutes.  The heart resumed sinus rhythm spontaneously without need for cardioversion. All proximal and distal anastomoses were inspected for hemostasis and appropriate graft orientation.  Epicardial pacing wires were placed to the right ventricular outflow tract and to the right atrial appendage.  The patient was weaned from cardiopulmonary bypass without difficulty.  The patients rhythm at separation from bypass was normal sinus rhythm.  No inotropic support was required.  Total cardiopulmonary bypass time for the operation was 94 minutes.  The patients venous and arterial cannulae were removed uneventfully. Protamine was administered to reverse the anticoagulation.  The mediastinum and the left chest were irrigated with saline solution containing vancomycin. Meticulous surgical hemostasis was ascertained.  The mediastinum and the left chest were drained with three chest tubes placed through separate stab incisions inferiorly.  The median sternotomy was closed in routine fashion.  All lower extremity incisions were closed in multiple layers in routine fashion.  All skin incisions were closed with subcuticular skin closures.  The patient tolerated the procedure well and was transported to the surgical intensive care unit in stable condition.  There were no intraoperative complications.  All sponge, needle and  instrument counts were verified correct at the completion of the operation.  No autologous blood products were administered. DD:  02/20/00 TD:  02/22/00 Job: [id number]

## 2011-02-09 NOTE — Op Note (Signed)
   TNAMERYER, ASATO                         ACCOUNT NO.:  [id number]   MEDICAL RECORD NO.:  [id number]                   PATIENT TYPE:  AMB   LOCATION:  ENDO                                 FACILITY:  [facility]   PHYSICIAN:  Georgiana Spinner, M.D.                 DATE OF BIRTH:  1934/05/14   DATE OF PROCEDURE:  DATE OF DISCHARGE:                                 OPERATIVE REPORT   PROCEDURE:  Colonoscopy.   GASTROENTEROLOGIST:  Georgiana Spinner, M.D.   INDICATIONS FOR PROCEDURE:  Colon cancer screening.   ANESTHESIA:  Demerol additional 20, Versed 2 mg.   DESCRIPTION OF PROCEDURE:  With the patient mildly sedated in the left  lateral decubitus position, a rectal exam was performed which was  unremarkable.  Subsequently, the Olympus video scopic colonoscope was  inserted in the rectum and pass under direct vision to the cecum identified  by the ileocecal valve and appendiceal orifice.  Both were photographed.  From this point, the colonoscope was slowly withdrawn, taking  circumferential views of the entire colonic mucosa stopping only in the  rectum which appeared normal on direct view, showed hemorrhoids on  retroflexed view and on pull-through the anal canal.  The patient's vital  signs and pulse oximeter remained stable.  The patient tolerated the  procedure well without apparent complications.   FINDINGS:  Internal hemorrhoids, otherwise unremarkable examination.   PLAN:  Repeat examination in five to 10 years.                                               Georgiana Spinner, M.D.    GMO/MEDQ  D:  04/24/2002  T:  04/29/2002  Job:  [id number]

## 2011-02-09 NOTE — Cardiovascular Report (Signed)
[place]. [facility]  Patient:    Shawn Bauer, Shawn Bauer                       MRN: [id number] Proc. Date: 02/06/00 Adm. Date:  [id number] Attending:  Silvestre Mesi CC:         Janae Bridgeman. Eloise Harman., M.D.             Cardiac Catheterization Lab                        Cardiac Catheterization  REFERRING PHYSICIAN:  Janae Bridgeman. Eloise Harman., M.D.  PROCEDURES: 1. Left heart catheterization. 2. Coronary cineangiography. 3. Left ventricular cineangiography. 4. Left internal mammary artery cineangiography. 5. Abdominal aortogram. 6. Perclose of the right femoral artery.  INDICATION FOR PROCEDURES:  This 77 year old male has a one-year history of short episodes of chest pain and a recent episode of syncope for which he sought medical attention.  He then had a treadmill exercise tolerance test done on Jan 24, 2000, which was markedly positive by electrocardiographic standards with marked ST segment depression indicating myocardial ischemia.  DESCRIPTION OF PROCEDURE:  After signing an informed consent, the patient was premedicated with 50 mg of Benadryl intravenously and brought to the cardiac catheterization lab at [facility].  His right groin was prepped and draped in a sterile fashion and anesthetized locally with 1% lidocaine.  A 6-French introducer sheath was inserted percutaneously into the right femoral artery.  The 6-French #4 Judkins coronary catheters were used to make injections into the coronary arteries.  The right coronary catheter was used to make a mid stream injection into the left subclavian artery, visualizing the left internal mammary artery.  A 6-French pigtail catheter was used to measure pressures in the left ventricle and aorta and to make mid stream injections into the left ventricle and abdominal aorta.  The patient tolerated the procedure well and no complications were noted.  At the end of the procedure, the catheter and  sheath were removed, and hemostasis was easily obtained with a Perclose closure system.  He was then returned to the outpatient department for further monitoring prior to anticipated same-day discharge.  MEDICATIONS GIVEN:  None.  HEMODYNAMIC DATA:  Left ventricular pressure 148/0-22; aortic pressure 155/73 with a mean of 104.  Left ventricular ejection fraction was estimated at approximately 70%.  CINE FINDINGS:  Coronary cineangiography: 1. Left coronary artery:  The ostium and left main appear normal. 2. Left anterior descending artery:  There is moderate calcification in the    proximal and middle segment.  There is a segmental plaque throughout the    proximal segment with a segmental 50% narrowing followed by a focal 90%    stenosis in the middle segment which also involves the takeoff of the    second anterolateral branch.  This second anterolateral branch is a    moderate sized vessel and has an ostial lesion of 70-80% and a focal    stenosis in its proximal segment of 70%. 3. Circumflex coronary artery:  The circumflex has mild calcification.  There    is a critical 95% focal stenosis in the proximal segment.  The middle    segment has a long segment of plaque which causes a segmental 50-60%    narrowing. 4. Right coronary artery:  The right coronary artery is a large vessel    supplying the posterolateral circulation to the left ventricle.  The    posterior descending is mostly supplied by the distal left anterior    descending which wraps around the apex.  The ostium of the right coronary    artery appears normal.  There is a minor plaque of 20% stenosis in the    proximal segment.  The proximal segment is moderately tortuous.  The distal    segment at the acute angle and at the crux appears normal.  The first large    posterolateral branch to the left ventricle has a severe 90% focal    stenosis in its proximal segment.  The second and third posterolateral    branches  to the left ventricle appear normal. 5. Left internal mammary artery appears normal.  Left ventricular cineangiogram:  The left ventricular chamber size appears normal.  The wall thickness and contractility appears normal.  There is normal contraction in the anterior, inferior, and apical segments.  The mitral and aortic valves appear normal.  Abdominal aortogram:  The mid abdominal aorta appears normal, and the renal arteries also are normal.  FINAL DIAGNOSES: 1. Three-vessel coronary artery disease with 95% proximal circumflex lesion,    a 90% mid left anterior descending artery lesion, a 90% distal right    coronary artery lesion in the first posterolateral branch to the left    ventricle. 2. Normal left internal mammary artery. 3. Normal left ventricular function. 4. Normal mitral and aortic valves. 5. Normal abdominal aorta and renal arteries. 6. Successful Perclose of the right femoral artery.  DISPOSITION:  Will ask CVTS to see to evaluate for coronary artery bypass graft surgery. DD:  02/06/00 TD:  02/06/00 Job: [id number]

## 2011-10-23 ENCOUNTER — Encounter: Payer: Self-pay | Admitting: Cardiovascular Disease

## 2011-11-07 ENCOUNTER — Encounter: Payer: Self-pay | Admitting: *Deleted

## 2011-11-20 ENCOUNTER — Encounter: Payer: Self-pay | Admitting: Cardiovascular Disease

## 2011-11-20 ENCOUNTER — Ambulatory Visit (INDEPENDENT_AMBULATORY_CARE_PROVIDER_SITE_OTHER): Payer: Medicare Other | Admitting: Cardiovascular Disease

## 2011-11-20 DIAGNOSIS — I25810 Atherosclerosis of coronary artery bypass graft(s) without angina pectoris: Secondary | ICD-10-CM

## 2011-11-20 DIAGNOSIS — I10 Essential (primary) hypertension: Secondary | ICD-10-CM

## 2011-11-20 DIAGNOSIS — E785 Hyperlipidemia, unspecified: Secondary | ICD-10-CM

## 2011-11-20 NOTE — Assessment & Plan Note (Signed)
The patient is stable without anginal symptoms. His electrocardiogram is within normal limits. He seems to have good control of his cardiovascular risk factors. I would like to see him back in followup in one year.

## 2011-11-20 NOTE — Assessment & Plan Note (Signed)
Lipids reviewed as managed by Dr. Selena Batten. The patient is on a statin drug with Mevacor 40 mg daily.

## 2011-11-20 NOTE — Patient Instructions (Signed)
Your physician wants you to follow-up in: 1 YEAR.  You will receive a reminder letter in the mail two months in advance. If you don't receive a letter, please call our office to schedule the follow-up appointment.  Your physician recommends that you continue on your current medications as directed. Please refer to the Current Medication list given to you today.  

## 2011-11-20 NOTE — Assessment & Plan Note (Signed)
The patient again has mild systolic hypertension. He reports anxiety associated with his physician visits. He tells me blood pressures at home are checked regularly and range in the 130s over 70s. Will continue his current medications which include losartan and metoprolol.

## 2011-11-20 NOTE — Progress Notes (Signed)
   HPI:  78 year old man presenting for followup of coronary artery disease. The patient underwent coronary bypass surgery 12 years ago. He had an exercise treadmill study done last year and this showed no significant ischemia. He had fair exercise tolerance into level III of the Bruce protocol.  The patient has not engaged in routine exercise, but he remains active. He hunts and fishes regularly. He denies exertional symptoms. He tells me that his legs don't feel as strong as they used to. He denies chest pain or pressure. He has no dyspnea, edema, palpitations, lightheadedness, or syncope.  The patient had recent lab work done by Dr. Selena Batten. This was reviewed today and showed triglycerides of 46, cholesterol 126, LDL 64, HDL 53. His LDL particle number was increased at 1105 and small LDL was 792 with an LDL size of 20.  Outpatient Encounter Prescriptions as of 11/20/2011  Medication Sig Dispense Refill  . aspirin 81 MG tablet Take 81 mg by mouth daily.      . budesonide-formoterol (SYMBICORT) 160-4.5 MCG/ACT inhaler Inhale 2 puffs into the lungs 2 (two) times daily.      Marland Kitchen losartan (COZAAR) 50 MG tablet Take 50 mg by mouth daily.      Marland Kitchen lovastatin (MEVACOR) 40 MG tablet Take 40 mg by mouth 2 (two) times daily.       . metoprolol succinate (TOPROL-XL) 50 MG 24 hr tablet Take 50 mg by mouth 2 (two) times daily. Take with or immediately following a meal.      . vitamin B-12 (CYANOCOBALAMIN) 1000 MCG tablet Take 1,000 mcg by mouth daily.        No Known Allergies  Past Medical History  Diagnosis Date  . HTN (hypertension)   . Hyperlipidemia   . CAD (coronary artery disease)     s/p CABG  . Asthma   . Degenerative disk disease     cervical/spinal stenosis    ROS: Negative except as per HPI  BP 146/80  Pulse 76  Ht 5' 10.5" (1.791 m)  Wt 72.485 kg (159 lb 12.8 oz)  BMI 22.60 kg/m2  PHYSICAL EXAM: Pt is alert and oriented, NAD HEENT: normal Neck: JVP - normal, carotids 2+= without  bruits Lungs: CTA bilaterally CV: RRR without murmur or gallop Abd: soft, NT, Positive BS, no hepatomegaly Ext: no C/C/E, distal pulses intact and equal Skin: warm/dry no rash  EKG:  Normal sinus rhythm with rare PVCs, heart rate 76 beats per minute.  ASSESSMENT AND PLAN:

## 2012-02-26 ENCOUNTER — Encounter: Payer: Self-pay | Admitting: Cardiovascular Disease

## 2012-09-26 ENCOUNTER — Encounter: Payer: Self-pay | Admitting: Cardiovascular Disease

## 2012-11-07 ENCOUNTER — Ambulatory Visit: Payer: Medicare Other | Admitting: Cardiovascular Disease

## 2012-12-30 ENCOUNTER — Ambulatory Visit (INDEPENDENT_AMBULATORY_CARE_PROVIDER_SITE_OTHER): Payer: Medicare Other | Admitting: Cardiovascular Disease

## 2012-12-30 ENCOUNTER — Encounter: Payer: Self-pay | Admitting: Cardiovascular Disease

## 2012-12-30 VITALS — BP 120/62 | HR 78 | Ht 70.0 in | Wt 155.0 lb

## 2012-12-30 DIAGNOSIS — I25810 Atherosclerosis of coronary artery bypass graft(s) without angina pectoris: Secondary | ICD-10-CM

## 2012-12-30 DIAGNOSIS — I10 Essential (primary) hypertension: Secondary | ICD-10-CM

## 2012-12-30 DIAGNOSIS — E785 Hyperlipidemia, unspecified: Secondary | ICD-10-CM

## 2012-12-30 NOTE — Patient Instructions (Addendum)
Your physician has requested that you have an exercise stress myoview. For further information please visit [email or web address]. Please follow instruction sheet, as given.  Your physician wants you to follow-up in: 1 YEAR with Dr Cooper. You will receive a reminder letter in the mail two months in advance. If you don't receive a letter, please call our office to schedule the follow-up appointment.  Your physician recommends that you continue on your current medications as directed. Please refer to the Current Medication list given to you today.  

## 2012-12-30 NOTE — Progress Notes (Signed)
   HPI:  79 year old gentleman presenting for followup evaluation. The patient has coronary artery disease and underwent CABG about 13 years ago.  The patient experienced chest tightness was shoveling snow this winter. His symptoms resolved with exertion. The pain was nonradiating. He has not experienced this since his bypass surgery. He has not had chest tightness with routine activities. However, he feels very fatigued and reports "no energy." He denies orthopnea, palpitations, or edema. He is compliant with his medications.  Lipids from January show a cholesterol of 114, triglycerides 72, HDL 38, and LDL of 62. Creatinine was 1.4  Outpatient Encounter Prescriptions as of 12/30/2012  Medication Sig Dispense Refill  . aspirin 81 MG tablet Take 81 mg by mouth daily.      . budesonide-formoterol (SYMBICORT) 160-4.5 MCG/ACT inhaler Inhale 2 puffs into the lungs 2 (two) times daily.      Marland Kitchen losartan (COZAAR) 50 MG tablet Take 50 mg by mouth daily.      Marland Kitchen lovastatin (MEVACOR) 40 MG tablet Take 2 tablets twice a day      . metoprolol succinate (TOPROL-XL) 50 MG 24 hr tablet Take 50 mg by mouth 2 (two) times daily. Take with or immediately following a meal.      . vitamin B-12 (CYANOCOBALAMIN) 1000 MCG tablet Take 1,000 mcg by mouth daily.       No facility-administered encounter medications on file as of 12/30/2012.    No Known Allergies  Past Medical History  Diagnosis Date  . HTN (hypertension)   . Hyperlipidemia   . CAD (coronary artery disease)     s/p CABG  . Asthma   . Degenerative disk disease     cervical/spinal stenosis    ROS: Negative except as per HPI  BP 120/62  Pulse 78  Ht 5\' 10"  (1.778 m)  Wt 155 lb (70.308 kg)  BMI 22.24 kg/m2  SpO2 98%  PHYSICAL EXAM: Pt is alert and oriented, NAD HEENT: normal Neck: JVP - normal, carotids 2+= without bruits Lungs: CTA bilaterally CV: RRR without murmur or gallop Abd: soft, NT, Positive BS, no hepatomegaly Ext: no C/C/E, distal  pulses intact and equal Skin: warm/dry no rash  EKG:  Normal sinus rhythm 81 beats per minute, first degree AV block, PVCs, otherwise within normal limits  ASSESSMENT AND PLAN: 1. Coronary artery disease status post CABG. The patient reports exertional angina and fatigue. Recommend an exercise Myoview stress test for further risk stratification. His cardiovascular risk factors otherwise appear well controlled. We will be in touch with him when his stress test results are back.  2. Hypertension. Blood pressure is controlled on a combination of metoprolol succinate and losartan.  3. Hyperlipidemia. Lipids were reviewed from Dr. Elmyra Ricks office. He remains on lovastatin 80 mg daily.  Tonny Bollman 12/30/2012 3:36 PM

## 2013-01-05 ENCOUNTER — Ambulatory Visit (HOSPITAL_COMMUNITY): Payer: Medicare Other | Attending: Cardiovascular Disease | Admitting: Radiology

## 2013-01-05 VITALS — BP 136/61 | Ht 70.0 in | Wt 154.0 lb

## 2013-01-05 DIAGNOSIS — I25810 Atherosclerosis of coronary artery bypass graft(s) without angina pectoris: Secondary | ICD-10-CM

## 2013-01-05 DIAGNOSIS — R5381 Other malaise: Secondary | ICD-10-CM | POA: Insufficient documentation

## 2013-01-05 DIAGNOSIS — R0789 Other chest pain: Secondary | ICD-10-CM | POA: Insufficient documentation

## 2013-01-05 DIAGNOSIS — R0602 Shortness of breath: Secondary | ICD-10-CM | POA: Insufficient documentation

## 2013-01-05 DIAGNOSIS — I2510 Atherosclerotic heart disease of native coronary artery without angina pectoris: Secondary | ICD-10-CM

## 2013-01-05 DIAGNOSIS — R072 Precordial pain: Secondary | ICD-10-CM

## 2013-01-05 DIAGNOSIS — R5383 Other fatigue: Secondary | ICD-10-CM | POA: Insufficient documentation

## 2013-01-05 MED ORDER — TECHNETIUM TC 99M SESTAMIBI GENERIC - CARDIOLITE
11.0000 | Freq: Once | INTRAVENOUS | Status: AC | PRN
  Administered 2013-01-05: 11 via INTRAVENOUS

## 2013-01-05 MED ORDER — TECHNETIUM TC 99M SESTAMIBI GENERIC - CARDIOLITE
33.0000 | Freq: Once | INTRAVENOUS | Status: AC | PRN
  Administered 2013-01-05: 33 via INTRAVENOUS

## 2013-01-05 NOTE — Progress Notes (Signed)
[facility] SITE 3 NUCLEAR MED [address] [place], [place] [address] [phone]    Cardiology Nuclear Med Study  Shawn Bauer is a 79 y.o. male     MRN : [id number]     DOB: [birth year]  Procedure Date: 01/05/2013  Nuclear Med Background Indication for Stress Test:  Evaluation for Ischemia and Graft Patency History:  Asthma and 2001 Heart Cath: EF: 70% 3V DZ-CABG 11/14/10 GXT: NL  Cardiac Risk Factors: Hypertension and Lipids  Symptoms:  Chest Tightness with Exertion, Fatigue and SOB   Nuclear Pre-Procedure Caffeine/Decaff Intake:  None NPO After: 7:00pm   Lungs:  clear O2 Sat: 98% on room air. IV 0.9% NS with Angio Cath:  20g  IV Site: R Antecubital  IV Started by:  Stanton Kidney, EMT-P  Chest Size (in):  40 Cup Size: n/a  Height: 5\' 10"  (1.778 m)  Weight:  154 lb (69.854 kg)  BMI:  Body mass index is 22.1 kg/(m^2). Tech Comments:  Toprol last taken 5 pm yesterday, per patient.    Nuclear Med Study 1 or 2 day study: 1 day  Stress Test Type:  Stress  Reading MD: Kristeen Miss, MD  Order Authorizing Provider:  M.Cooper MD  Resting Radionuclide: Technetium 75m Sestamibi  Resting Radionuclide Dose: 11.0 mCi   Stress Radionuclide:  Technetium 11m Sestamibi  Stress Radionuclide Dose: 33.0 mCi           Stress Protocol Rest HR: 68 Stress HR: 139  Rest BP: 136/61 Stress BP: 192/77  Exercise Time (min): 4:00 METS: 5.80   Predicted Max HR: 144 bpm % Max HR: 96.53 bpm Rate Pressure Product: 10272   Dose of Adenosine (mg):  n/a Dose of Lexiscan: n/a mg  Dose of Atropine (mg): n/a Dose of Dobutamine: n/a mcg/kg/min (at max HR)  Stress Test Technologist: Milana Na, EMT-P  Nuclear Technologist:  Domenic Polite, CNMT     Rest Procedure:  Myocardial perfusion imaging was performed at rest 45 minutes following the intravenous administration of Technetium 13m Sestamibi. Rest ECG: NSR - Normal EKG  Stress Procedure:  The patient exercised on the treadmill  utilizing the Bruce Protocol for 4:00 minutes. The patient stopped due to fatigue and denied any chest pain.  Technetium 52m Sestamibi was injected at peak exercise and myocardial perfusion imaging was performed after a brief delay. Stress ECG:  There is nonspecific, non diagnostic ST changes.  There are scattered PVCs.  QPS Raw Data Images:  Mild diaphragmatic attenuation.  Normal left ventricular size. Stress Images:  There is small area of moderately severe  decreased uptake in the mid  inferior wall. Rest Images:  There is small area of mildly  decreased uptake in the mid  inferior wall. Subtraction (SDS):  There is a very small defect in the mid inferior wall with stress that is not seen with rest.    Transient Ischemic Dilatation (Normal <1.22):  0.94 Lung/Heart Ratio (Normal <0.45):  0.31  Quantitative Gated Spect Images QGS EDV:  n/a QGS ESV:  n/a  Impression Exercise Capacity:  Fair exercise capacity. BP Response:  Normal blood pressure response. Clinical Symptoms:  No significant symptoms noted. ECG Impression:  No significant ST segment change suggestive of ischemia. Comparison with Prior Nuclear Study: No images to compare  Overall Impression:  Low risk stress nuclear study.  There is a small area of  reversible ischemia in the mid inferior wall.  ( SDS = 2)   LV Ejection Fraction: Study not  gated.  LV Wall Motion:  Study not gated.   Shawn Bauer, Montez Hageman., MD, [facility] 01/05/2013, 5:15 PM Office - [phone] Pager [phone]

## 2013-01-09 ENCOUNTER — Telehealth: Payer: Self-pay | Admitting: Cardiovascular Disease

## 2013-01-09 NOTE — Telephone Encounter (Signed)
Pt aware of Myoview results by phone. Myoview resulted as low risk with small area of reversible ischemia. The pt denies any further episodes of CP since his episode from shoveling snow.  I made the pt aware that I will contact him next week after Dr Excell Seltzer reviews his stress test. Pt agreed with plan.

## 2013-01-09 NOTE — Telephone Encounter (Signed)
Follow Up     Pt had a stress test done on 4/14. Calling in to follow up on results. Please call.

## 2013-01-12 ENCOUNTER — Encounter: Payer: Self-pay | Admitting: Cardiovascular Disease

## 2013-01-12 NOTE — Telephone Encounter (Signed)
Notes Recorded by Tonny Bollman, MD on 01/12/2013 at 12:48 PM Abnormal but low risk study. I reviewed the images. I discussed the findings with the patient. He is having no anginal symptoms at present. We will continue with medical management. I would like to see him back in 6 months. He will call sooner if problems arise.

## 2013-03-25 ENCOUNTER — Encounter: Payer: Self-pay | Admitting: Cardiovascular Disease

## 2013-07-15 ENCOUNTER — Encounter: Payer: Self-pay | Admitting: Cardiovascular Disease

## 2013-07-15 ENCOUNTER — Ambulatory Visit (INDEPENDENT_AMBULATORY_CARE_PROVIDER_SITE_OTHER): Payer: Medicare Other | Admitting: Cardiovascular Disease

## 2013-07-15 VITALS — BP 140/80 | HR 72 | Ht 70.0 in | Wt 154.0 lb

## 2013-07-15 DIAGNOSIS — I25810 Atherosclerosis of coronary artery bypass graft(s) without angina pectoris: Secondary | ICD-10-CM

## 2013-07-15 DIAGNOSIS — E785 Hyperlipidemia, unspecified: Secondary | ICD-10-CM

## 2013-07-15 DIAGNOSIS — I10 Essential (primary) hypertension: Secondary | ICD-10-CM

## 2013-07-15 NOTE — Progress Notes (Signed)
    HPI:  79 year old gentleman presents today for followup evaluation. The patient has CAD status post remote CABG. I saw him last in April and he noted that he experienced chest tightness with snow shoveling over the winter. He also complained of generalized fatigue.  The patient has had no recurrence of chest discomfort. He denies chest pain or chest pressure. He denies dyspnea, edema, orthopnea, PND, or heart palpitations. He is physically active without exertional symptoms. He reports compliance with his medications.  Outpatient Encounter Prescriptions as of 07/15/2013  Medication Sig Dispense Refill  . aspirin 81 MG tablet Take 81 mg by mouth daily.      . budesonide-formoterol (SYMBICORT) 160-4.5 MCG/ACT inhaler Inhale 2 puffs into the lungs 2 (two) times daily.      Marland Kitchen losartan (COZAAR) 50 MG tablet Take 50 mg by mouth daily.      Marland Kitchen lovastatin (MEVACOR) 40 MG tablet Take 2 tablets twice a day      . metoprolol succinate (TOPROL-XL) 50 MG 24 hr tablet Take 2 tablets (100 mg total) by mouth daily. Take with or immediately following a meal.      . vitamin B-12 (CYANOCOBALAMIN) 1000 MCG tablet Take 1,000 mcg by mouth daily.       No facility-administered encounter medications on file as of 07/15/2013.    No Known Allergies  Past Medical History  Diagnosis Date  . HTN (hypertension)   . Hyperlipidemia   . CAD (coronary artery disease)     s/p CABG  . Asthma   . Degenerative disk disease     cervical/spinal stenosis   ROS: Positive for bilateral shoulder pain with movements. He reports rotator cuff tears on both sides. Otherwise negative except as per HPI  Ht 5\' 10"  (1.778 m)  Wt 154 lb (69.854 kg)  BMI 22.1 kg/m2  PHYSICAL EXAM: Pt is alert and oriented, NAD HEENT: normal Neck: JVP - normal, carotids 2+= without bruits Lungs: CTA bilaterally CV: RRR without murmur or gallop Abd: soft, NT, Positive BS, no hepatomegaly Ext: no C/C/E, distal pulses intact and equal Skin:  warm/dry no rash  EKG:  Sinus rhythm with first degree AV block, heart rate 72 beats per minute.  Myoview 01/05/2013: Overall Impression: Low risk stress nuclear study. There is a small area of reversible ischemia in the mid inferior wall. ( SDS = 2)  LV Ejection Fraction: Study not gated. LV Wall Motion: Study not gated.  ASSESSMENT AND PLAN: 1. Coronary artery disease, native vessel. The patient is status post remote CABG. He is doing well without recurrence of anginal symptoms. His nuclear scan in April was low risk. His activity level will be increased with hunting season coming up. I advised him to contact us if anginal symptoms occur. Otherwise he will followup in one year. He does not want to carry sublingual nitroglycerin.  2. Hypertension. Blood pressure is well controlled on a combination of losartan metoprolol succinate.  3. Hyperlipidemia. Lipids are followed by Dr. Selena Batten. He remains on lovastatin.  Tonny Bollman 07/15/2013 12:55 PM

## 2013-07-15 NOTE — Patient Instructions (Signed)
Your physician wants you to follow-up in: 1 YEAR with Dr Cooper.  You will receive a reminder letter in the mail two months in advance. If you don't receive a letter, please call our office to schedule the follow-up appointment.  Your physician recommends that you continue on your current medications as directed. Please refer to the Current Medication list given to you today.  

## 2013-09-29 ENCOUNTER — Encounter: Payer: Self-pay | Admitting: Cardiovascular Disease

## 2014-05-07 ENCOUNTER — Encounter: Payer: Self-pay | Admitting: Cardiovascular Disease

## 2014-07-15 ENCOUNTER — Ambulatory Visit (INDEPENDENT_AMBULATORY_CARE_PROVIDER_SITE_OTHER): Payer: Medicare Other | Admitting: Cardiovascular Disease

## 2014-07-15 ENCOUNTER — Encounter: Payer: Self-pay | Admitting: Cardiovascular Disease

## 2014-07-15 VITALS — BP 128/62 | Ht 70.0 in | Wt 146.8 lb

## 2014-07-15 DIAGNOSIS — E785 Hyperlipidemia, unspecified: Secondary | ICD-10-CM

## 2014-07-15 DIAGNOSIS — Z23 Encounter for immunization: Secondary | ICD-10-CM

## 2014-07-15 DIAGNOSIS — I10 Essential (primary) hypertension: Secondary | ICD-10-CM

## 2014-07-15 DIAGNOSIS — I25810 Atherosclerosis of coronary artery bypass graft(s) without angina pectoris: Secondary | ICD-10-CM

## 2014-07-15 NOTE — Patient Instructions (Signed)
Your physician wants you to follow-up in: 1 YEAR with Dr Cooper.  You will receive a reminder letter in the mail two months in advance. If you don't receive a letter, please call our office to schedule the follow-up appointment.  Your physician recommends that you continue on your current medications as directed. Please refer to the Current Medication list given to you today.  

## 2014-07-15 NOTE — Progress Notes (Signed)
   Background: The patient is followed for coronary artery disease status post remote CABG. Last nuclear scan in April 2014 was low risk with a small area of inferior ischemia.  HPI:  80 year old gentleman presenting for followup evaluation. He is doing well from a cardiac perspective. He denies chest pain, chest pressure, shortness of breath, leg swelling, or heart palpitations. He had some anginal symptoms with shoveling snow winter before last and underwent a nuclear scan at that time as outlined above. As the study was low risk, he has been managed medically.  Studies:  Myoview scan 01/05/2013 Impression  Exercise Capacity: Fair exercise capacity.  BP Response: Normal blood pressure response.  Clinical Symptoms: No significant symptoms noted.  ECG Impression: No significant ST segment change suggestive of ischemia.  Comparison with Prior Nuclear Study: No images to compare  Overall Impression: Low risk stress nuclear study. There is a small area of reversible ischemia in the mid inferior wall. ( SDS = 2)  LV Ejection Fraction: Study not gated. LV Wall Motion: Study not gated.  Outpatient Encounter Prescriptions as of 07/15/2014  Medication Sig  . aspirin 81 MG tablet Take 81 mg by mouth daily.  . budesonide-formoterol (SYMBICORT) 160-4.5 MCG/ACT inhaler Inhale 2 puffs into the lungs 2 (two) times daily.  Marland Kitchen losartan (COZAAR) 50 MG tablet Take 50 mg by mouth daily.  Marland Kitchen lovastatin (MEVACOR) 40 MG tablet Take 2 tablets twice a day  . metoprolol succinate (TOPROL-XL) 50 MG 24 hr tablet Take 50 mg by mouth daily. Patient taking 1/2 tab in the morning and 1/2 tab in the evening. Total 50 mg per day. Take with or immediately following a meal.  . vitamin B-12 (CYANOCOBALAMIN) 1000 MCG tablet Take 1,000 mcg by mouth daily.    No Known Allergies  Past Medical History  Diagnosis Date  . HTN (hypertension)   . Hyperlipidemia   . CAD (coronary artery disease)     s/p CABG  . Asthma   .  Degenerative disk disease     cervical/spinal stenosis    family history includes Heart attack (age of onset: 46) in his father.   ROS: Negative except as per HPI  BP 128/62  Ht 5\' 10"  (1.778 m)  Wt 146 lb 12.8 oz (66.588 kg)  BMI 21.06 kg/m2  PHYSICAL EXAM: Pt is alert and oriented, NAD HEENT: normal Neck: JVP - normal, carotids 2+= without bruits Lungs: CTA bilaterally CV: RRR without murmur or gallop Abd: soft, NT, Positive BS, no hepatomegaly Ext: no C/C/E, distal pulses intact and equal Skin: warm/dry no rash  EKG:  Sinus rhythm with first degree AV block 68 beats per minute, otherwise within normal limits.  ASSESSMENT AND PLAN: 1. Coronary atherosclerosis, native vessel, status post remote CABG. The patient is stable without symptoms of angina. His stress test from last year was reviewed with him again today. We will continue with current medical therapy in the context of a low risk nuclear study and no symptoms at present. I will see him back in one year unless problems arise.  2. Hypertension. Blood pressure under ideal control on losartan and metoprolol succinate.  3. Hyperlipidemia. The patient is treated with lovastatin. He is followed by Dr. Maudie Mercury and has lab work coming up early next month.  Sherren Mocha MD 07/15/2014 11:53 AM

## 2015-02-07 ENCOUNTER — Encounter: Payer: Self-pay | Admitting: Cardiovascular Disease

## 2015-08-02 ENCOUNTER — Encounter: Payer: Self-pay | Admitting: Cardiovascular Disease

## 2015-08-08 ENCOUNTER — Encounter: Payer: Self-pay | Admitting: Cardiovascular Disease

## 2015-08-08 ENCOUNTER — Ambulatory Visit (INDEPENDENT_AMBULATORY_CARE_PROVIDER_SITE_OTHER): Payer: Medicare Other | Admitting: Cardiovascular Disease

## 2015-08-08 VITALS — BP 120/78 | HR 81 | Ht 70.0 in | Wt 148.2 lb

## 2015-08-08 DIAGNOSIS — I25810 Atherosclerosis of coronary artery bypass graft(s) without angina pectoris: Secondary | ICD-10-CM | POA: Diagnosis not present

## 2015-08-08 DIAGNOSIS — I10 Essential (primary) hypertension: Secondary | ICD-10-CM | POA: Diagnosis not present

## 2015-08-08 DIAGNOSIS — E785 Hyperlipidemia, unspecified: Secondary | ICD-10-CM | POA: Diagnosis not present

## 2015-08-08 NOTE — Patient Instructions (Signed)

## 2015-08-08 NOTE — Progress Notes (Signed)
Cardiology Office Note Date:  08/08/2015   ID:  Shawn Bauer, DOB September 14, 1934, MRN ON:[id number]  PCP:  [name], MD  Cardiologist:  Sherren Mocha, MD    Chief Complaint  Patient presents with  . Shortness of Breath    History of Present Illness: Shawn Bauer is a 81 y.o. male who presents for follow-up of coronary artery disease and remote CABG. His last nuclear stress test in 2014 was low risk with a small area of inferior ischemia.  This was performed to evaluate chest discomfort that occurred while shoveling snow. Medical therapy was recommended.  He's had no further symptoms of angina since that time.  He has longstanding shortness of breath, attributed to asthma. He feels Symbicort is helping him.  The patient has no cardiac complaints today. He specifically denies chest pain or pressure. No edema, orthopnea, or PND. No heart palpitations, lightheadedness, or syncope.  Over the past few months he has been physically active. He gets out in the woods frequently when he is hunting.  Past Medical History  Diagnosis Date  . HTN (hypertension)   . Hyperlipidemia   . CAD (coronary artery disease)     s/p CABG  . Asthma   . Degenerative disk disease     cervical/spinal stenosis    Past Surgical History  Procedure Laterality Date  . Coronary artery bypass graft  2001    after positive exercise treadmill test.  LIMA-LAD, Radial artery to LCx marginal, SVG to second diagonal, SVG to PDA  . Bilateral inguinal hernia repair    . Back surgery    . Tonsillectomy and adenoidectomy    . Appendectomy      Current Outpatient Prescriptions  Medication Sig Dispense Refill  . aspirin 81 MG tablet Take 81 mg by mouth daily.    . budesonide-formoterol (SYMBICORT) 160-4.5 MCG/ACT inhaler Inhale 2 puffs into the lungs 2 (two) times daily.    Marland Kitchen losartan (COZAAR) 50 MG tablet Take 50 mg by mouth daily.    Marland Kitchen lovastatin (MEVACOR) 40 MG tablet Take 2 tablets once a day    . metoprolol  succinate (TOPROL-XL) 50 MG 24 hr tablet Take 50 mg by mouth daily. Patient taking 1/2 tab in the morning and 1/2 tab in the evening. Total 50 mg per day. Take with or immediately following a meal.    . vitamin B-12 (CYANOCOBALAMIN) 1000 MCG tablet Take 1,000 mcg by mouth daily.     No current facility-administered medications for this visit.    Allergies:   Review of patient's allergies indicates no known allergies.   Social History:  The patient  reports that he has quit smoking. He does not have any smokeless tobacco history on file. He reports that he does not drink alcohol.   Family History:  The patient's  family history includes Heart attack in his brother; Heart attack (age of onset: 75) in his father.    ROS:  Please see the history of present illness.  All other systems are reviewed and negative.   PHYSICAL EXAM: VS:  BP 120/78 mmHg  Pulse 81  Ht 5\' 10"  (1.778 m)  Wt 148 lb 3.2 oz (67.223 kg)  BMI 21.26 kg/m2  SpO2 99% , BMI Body mass index is 21.26 kg/(m^2). GEN: Well nourished, well developed, in no acute distress HEENT: normal Neck: no JVD, no masses. No carotid bruits Cardiac: RRR without murmur or gallop  Respiratory:  clear to auscultation bilaterally, normal work of breathing GI: soft, nontender, nondistended, + BS MS: no deformity or atrophy Ext: no pretibial edema, pedal pulses 2+= bilaterally Skin: warm and dry, no rash Neuro:  Strength and sensation are intact Psych: euthymic mood, full affect  EKG:  EKG is ordered today. The ekg ordered today shows  Sinus rhythm 81 bpm, first-degree AV block with occasional PVCs  Recent Labs: No results found for requested labs within last 365 days.   Lipid Panel  No results found for: CHOL, TRIG, HDL, CHOLHDL, VLDL, LDLCALC, LDLDIRECT    Wt Readings from Last 3 Encounters:  08/08/15 148 lb 3.2 oz (67.223 kg)  07/15/14 146 lb 12.8 oz (66.588 kg)  07/15/13 154 lb (69.854 kg)     Cardiac Studies  Reviewed: Myoview scan 01/05/2013 Impression  Exercise Capacity: Fair exercise capacity.  BP Response: Normal blood pressure response.  Clinical Symptoms: No significant symptoms noted.  ECG Impression: No significant ST segment change suggestive of ischemia.  Comparison with Prior Nuclear Study: No images to compare  Overall Impression: Low risk stress nuclear study. There is a small area of reversible ischemia in the mid inferior wall. ( SDS = 2)  LV Ejection Fraction: Study not gated. LV Wall Motion: Study not gated.   ASSESSMENT AND PLAN: 1.  CAD, native vessel, status post remote CABG:  The patient is physically active with no symptoms of angina. His current medical program will be continued. He is on aspirin, a statin drug, and a beta blocker.  2. Essential hypertension:  Blood pressure well controlled on losartan and metoprolol succinate  3. Hyperlipidemia:  Lipids followed by Dr. Maudie Mercury. I reviewed his lab work which is been sent over and he is at goal.  Current medicines are reviewed with the patient today.  The patient does not have concerns regarding medicines.  Labs/ tests ordered today include:   Orders Placed This Encounter  Procedures  . EKG 12-Lead   Disposition:   FU one year  Signed, Sherren Mocha, MD  08/08/2015 10:52 AM    [facility] [address], [place], [place]  [address] Phone: [phone]; Fax: [phone]

## 2016-01-13 ENCOUNTER — Emergency Department (HOSPITAL_COMMUNITY)
Admission: EM | Admit: 2016-01-13 | Discharge: 2016-01-13 | Disposition: A | Discharge status: Home / Self Care | Payer: Medicare Other | Attending: Emergency Medicine | Admitting: Emergency Medicine

## 2016-01-13 ENCOUNTER — Encounter (HOSPITAL_COMMUNITY): Payer: Self-pay | Admitting: *Deleted

## 2016-01-13 ENCOUNTER — Emergency Department (HOSPITAL_COMMUNITY): Payer: Medicare Other

## 2016-01-13 DIAGNOSIS — I10 Essential (primary) hypertension: Secondary | ICD-10-CM | POA: Diagnosis not present

## 2016-01-13 DIAGNOSIS — Z951 Presence of aortocoronary bypass graft: Secondary | ICD-10-CM | POA: Insufficient documentation

## 2016-01-13 DIAGNOSIS — Z79899 Other long term (current) drug therapy: Secondary | ICD-10-CM | POA: Insufficient documentation

## 2016-01-13 DIAGNOSIS — Z87891 Personal history of nicotine dependence: Secondary | ICD-10-CM | POA: Diagnosis not present

## 2016-01-13 DIAGNOSIS — N201 Calculus of ureter: Secondary | ICD-10-CM | POA: Diagnosis not present

## 2016-01-13 DIAGNOSIS — Z7982 Long term (current) use of aspirin: Secondary | ICD-10-CM | POA: Insufficient documentation

## 2016-01-13 DIAGNOSIS — J45909 Unspecified asthma, uncomplicated: Secondary | ICD-10-CM | POA: Diagnosis not present

## 2016-01-13 DIAGNOSIS — N289 Disorder of kidney and ureter, unspecified: Secondary | ICD-10-CM | POA: Insufficient documentation

## 2016-01-13 DIAGNOSIS — R109 Unspecified abdominal pain: Secondary | ICD-10-CM | POA: Diagnosis present

## 2016-01-13 DIAGNOSIS — E785 Hyperlipidemia, unspecified: Secondary | ICD-10-CM | POA: Insufficient documentation

## 2016-01-13 DIAGNOSIS — I2510 Atherosclerotic heart disease of native coronary artery without angina pectoris: Secondary | ICD-10-CM | POA: Diagnosis not present

## 2016-01-13 DIAGNOSIS — Z7951 Long term (current) use of inhaled steroids: Secondary | ICD-10-CM | POA: Diagnosis not present

## 2016-01-13 LAB — URINALYSIS, ROUTINE W REFLEX MICROSCOPIC
Bilirubin Urine: NEGATIVE
Glucose, UA: NEGATIVE mg/dL
Ketones, ur: 15 mg/dL — AB
Leukocytes, UA: NEGATIVE
Nitrite: NEGATIVE
Protein, ur: 30 mg/dL — AB
Specific Gravity, Urine: 1.021 (ref 1.005–1.030)
pH: 6.5 (ref 5.0–8.0)

## 2016-01-13 LAB — COMPREHENSIVE METABOLIC PANEL
ALT: 20 U/L (ref 17–63)
AST: 22 U/L (ref 15–41)
Albumin: 3.7 g/dL (ref 3.5–5.0)
Alkaline Phosphatase: 65 U/L (ref 38–126)
Anion gap: 10 (ref 5–15)
BUN: 23 mg/dL — ABNORMAL HIGH (ref 6–20)
CO2: 26 mmol/L (ref 22–32)
Calcium: 9.5 mg/dL (ref 8.9–10.3)
Chloride: 105 mmol/L (ref 101–111)
Creatinine, Ser: 1.71 mg/dL — ABNORMAL HIGH (ref 0.61–1.24)
GFR calc Af Amer: 42 mL/min — ABNORMAL LOW (ref >60)
GFR calc non Af Amer: 36 mL/min — ABNORMAL LOW (ref >60)
Glucose, Bld: 171 mg/dL — ABNORMAL HIGH (ref 65–99)
Potassium: 4.7 mmol/L (ref 3.5–5.1)
Sodium: 141 mmol/L (ref 135–145)
Total Bilirubin: 1.1 mg/dL (ref 0.3–1.2)
Total Protein: 6.4 g/dL — ABNORMAL LOW (ref 6.5–8.1)

## 2016-01-13 LAB — CBC
HCT: 45.3 % (ref 39.0–52.0)
Hemoglobin: 15 g/dL (ref 13.0–17.0)
MCH: 32.1 pg (ref 26.0–34.0)
MCHC: 33.1 g/dL (ref 30.0–36.0)
MCV: 97 fL (ref 78.0–100.0)
Platelets: 178 10*3/uL (ref 150–400)
RBC: 4.67 MIL/uL (ref 4.22–5.81)
RDW: 12.9 % (ref 11.5–15.5)
WBC: 11 10*3/uL — ABNORMAL HIGH (ref 4.0–10.5)

## 2016-01-13 LAB — LIPASE, BLOOD: Lipase: 26 U/L (ref 11–51)

## 2016-01-13 LAB — URINE MICROSCOPIC-ADD ON

## 2016-01-13 MED ORDER — ONDANSETRON 8 MG PO TBDP: 8.0000 mg | ORAL_TABLET | Freq: Three times a day (TID) | ORAL | Status: DC | PRN

## 2016-01-13 MED ORDER — HYDROCODONE-ACETAMINOPHEN 5-325 MG PO TABS: 1.0000 | ORAL_TABLET | ORAL | Status: DC | PRN

## 2016-01-13 MED ORDER — TAMSULOSIN HCL 0.4 MG PO CAPS: 0.4000 mg | ORAL_CAPSULE | Freq: Every day | ORAL | Status: DC

## 2016-01-13 NOTE — ED Notes (Signed)
PT c/o acute onset lower abdominal pain that radiates to R flank.  Denies difficulty urinating, but unable to have bm since pain began.

## 2016-01-13 NOTE — ED Notes (Signed)
Pt st;s he had sudden onset of lower abd pain radiating into right flank area approx 3pm today.  Pt st's pain has subsided at this time

## 2016-01-13 NOTE — ED Provider Notes (Signed)
CSN: [id number]     Arrival date & time 01/13/16  1642 History   First MD Initiated Contact with Patient 01/13/16 2014     Chief Complaint  Patient presents with  . Abdominal Pain     (Consider location/radiation/quality/duration/timing/severity/associated sxs/prior Treatment) This is a 82 year old male, with PMHx of CAD s/p CABG, HTN, HLD and Asthma, who presents with acute onset abdominal and flank pain. He describes sharp lower abdominal pain with radiation to the right flank lasting approximately 1 hour. He notes associated nausea but denies vomiting, diarrhea, chest pain, shortness of breath, dysuria, hematuria, fevers or chills. He has no history of kidney stones.  Patient is a 82 y.o. male presenting with abdominal pain. The history is provided by the patient and the spouse. No language interpreter was used.  Abdominal Pain Associated symptoms: nausea   Associated symptoms: no chest pain, no chills, no constipation, no cough, no diarrhea, no dysuria, no fever, no hematuria, no shortness of breath and no vomiting     Past Medical History  Diagnosis Date  . HTN (hypertension)   . Hyperlipidemia   . CAD (coronary artery disease)     s/p CABG  . Asthma   . Degenerative disk disease     cervical/spinal stenosis   Past Surgical History  Procedure Laterality Date  . Coronary artery bypass graft  2001    after positive exercise treadmill test.  LIMA-LAD, Radial artery to LCx marginal, SVG to second diagonal, SVG to PDA  . Bilateral inguinal hernia repair    . Back surgery    . Tonsillectomy and adenoidectomy    . Appendectomy     Family History  Problem Relation Age of Onset  . Heart attack Father 20    died  . Heart attack Brother    Social History  Substance Use Topics  . Smoking status: Former Research scientist (life sciences)  . Smokeless tobacco: None  . Alcohol Use: No    Review of Systems  Constitutional: Negative for fever and chills.  Eyes: Negative for visual disturbance.   Respiratory: Negative for cough, chest tightness and shortness of breath.   Cardiovascular: Negative for chest pain and leg swelling.  Gastrointestinal: Positive for nausea and abdominal pain. Negative for vomiting, diarrhea, constipation and blood in stool.  Genitourinary: Positive for flank pain (right sided). Negative for dysuria, frequency and hematuria.  Musculoskeletal: Negative for back pain.  Neurological: Negative for headaches.      Allergies  Review of patient's allergies indicates no known allergies.  Home Medications   Prior to Admission medications   Medication Sig Start Date End Date Taking? Authorizing Provider  aspirin 81 MG tablet Take 81 mg by mouth daily.    Historical Provider, MD  budesonide-formoterol (SYMBICORT) 160-4.5 MCG/ACT inhaler Inhale 2 puffs into the lungs 2 (two) times daily.    Historical Provider, MD  losartan (COZAAR) 50 MG tablet Take 50 mg by mouth daily.    Historical Provider, MD  lovastatin (MEVACOR) 40 MG tablet Take 2 tablets once a day    Historical Provider, MD  metoprolol succinate (TOPROL-XL) 50 MG 24 hr tablet Take 50 mg by mouth daily. Patient taking 1/2 tab in the morning and 1/2 tab in the evening. Total 50 mg per day. Take with or immediately following a meal. 12/30/12   Sherren Mocha, MD  vitamin B-12 (CYANOCOBALAMIN) 1000 MCG tablet Take 1,000 mcg by mouth daily.    Historical Provider, MD   BP 127/58 mmHg  Pulse 69  Temp(Src) 97.7 F (36.5 C) (Oral)  Resp 16  Ht 5\' 10"  (1.778 m)  Wt 65.772 kg  BMI 20.81 kg/m2  SpO2 99% Physical Exam  Constitutional: He is oriented to person, place, and time. He appears well-developed and well-nourished. No distress.  HENT:  Head: Normocephalic and atraumatic.  Eyes: Conjunctivae are normal. No scleral icterus.  Cardiovascular: Normal rate and regular rhythm.   No murmur heard. Pulmonary/Chest: Effort normal and breath sounds normal. No respiratory distress. He has no wheezes. He has no  rales.  Abdominal: Soft. Bowel sounds are normal. He exhibits no distension. There is no tenderness. There is no guarding.  Musculoskeletal: He exhibits no edema.  Neurological: He is alert and oriented to person, place, and time.  Skin: Skin is warm and dry. He is not diaphoretic.  Psychiatric: He has a normal mood and affect.    ED Course  Procedures (including critical care time) Labs Review Labs Reviewed  COMPREHENSIVE METABOLIC PANEL - Abnormal; Notable for the following:    Glucose, Bld 171 (*)    BUN 23 (*)    Creatinine, Ser 1.71 (*)    Total Protein 6.4 (*)    GFR calc non Af Amer 36 (*)    GFR calc Af Amer 42 (*)    All other components within normal limits  CBC - Abnormal; Notable for the following:    WBC 11.0 (*)    All other components within normal limits  URINALYSIS, ROUTINE W REFLEX MICROSCOPIC (NOT AT [facility]) - Abnormal; Notable for the following:    APPearance CLOUDY (*)    Hgb urine dipstick LARGE (*)    Ketones, ur 15 (*)    Protein, ur 30 (*)    All other components within normal limits  URINE MICROSCOPIC-ADD ON - Abnormal; Notable for the following:    Squamous Epithelial / LPF 0-5 (*)    Bacteria, UA RARE (*)    All other components within normal limits  LIPASE, BLOOD   Results for orders placed or performed during the hospital encounter of 01/13/16  Lipase, blood  Result Value Ref Range   Lipase 26 11 - 51 U/L  Comprehensive metabolic panel  Result Value Ref Range   Sodium 141 135 - 145 mmol/L   Potassium 4.7 3.5 - 5.1 mmol/L   Chloride 105 101 - 111 mmol/L   CO2 26 22 - 32 mmol/L   Glucose, Bld 171 (H) 65 - 99 mg/dL   BUN 23 (H) 6 - 20 mg/dL   Creatinine, Ser 1.71 (H) 0.61 - 1.24 mg/dL   Calcium 9.5 8.9 - 10.3 mg/dL   Total Protein 6.4 (L) 6.5 - 8.1 g/dL   Albumin 3.7 3.5 - 5.0 g/dL   AST 22 15 - 41 U/L   ALT 20 17 - 63 U/L   Alkaline Phosphatase 65 38 - 126 U/L   Total Bilirubin 1.1 0.3 - 1.2 mg/dL   GFR calc non Af Amer 36 (L) >60  mL/min   GFR calc Af Amer 42 (L) >60 mL/min   Anion gap 10 5 - 15  CBC  Result Value Ref Range   WBC 11.0 (H) 4.0 - 10.5 K/uL   RBC 4.67 4.22 - 5.81 MIL/uL   Hemoglobin 15.0 13.0 - 17.0 g/dL   HCT 45.3 39.0 - 52.0 %   MCV 97.0 78.0 - 100.0 fL   MCH 32.1 26.0 - 34.0 pg   MCHC 33.1 30.0 - 36.0 g/dL   RDW 12.9 11.5 -  15.5 %   Platelets 178 150 - 400 K/uL  Urinalysis, Routine w reflex microscopic (not at [facility])  Result Value Ref Range   Color, Urine YELLOW YELLOW   APPearance CLOUDY (A) CLEAR   Specific Gravity, Urine 1.021 1.005 - 1.030   pH 6.5 5.0 - 8.0   Glucose, UA NEGATIVE NEGATIVE mg/dL   Hgb urine dipstick LARGE (A) NEGATIVE   Bilirubin Urine NEGATIVE NEGATIVE   Ketones, ur 15 (A) NEGATIVE mg/dL   Protein, ur 30 (A) NEGATIVE mg/dL   Nitrite NEGATIVE NEGATIVE   Leukocytes, UA NEGATIVE NEGATIVE  Urine microscopic-add on  Result Value Ref Range   Squamous Epithelial / LPF 0-5 (A) NONE SEEN   WBC, UA 0-5 0 - 5 WBC/hpf   RBC / HPF TOO NUMEROUS TO COUNT 0 - 5 RBC/hpf   Bacteria, UA RARE (A) NONE SEEN   Ct Renal Stone Study  01/13/2016  CLINICAL DATA:  Right flank pain. Prior appendectomy and bilateral inguinal hernia repair. EXAM: CT ABDOMEN AND PELVIS WITHOUT CONTRAST TECHNIQUE: Multidetector CT imaging of the abdomen and pelvis was performed following the standard protocol without IV contrast. COMPARISON:  None. FINDINGS: Lower chest: Left lower lobe 4 mm solid pulmonary nodule (series 205/ image 41). Visualized lower median sternotomy wire appears intact. Coronary atherosclerosis. Hepatobiliary: Simple 1.1 cm anterior left lower lobe cyst. Additional subcentimeter hypodense lateral segment left liver lobe lesion, too small to characterize. A few scattered granulomatous liver calcifications. Otherwise normal liver. Normal gallbladder with no radiopaque cholelithiasis. No biliary ductal dilatation. Pancreas: Normal, with no mass or duct dilation. Spleen: Normal size. No mass.  Adrenals/Urinary Tract: Normal adrenals. Obstructing 3 mm stone in the distal right ureter approximately 2 cm above the right ureterovesical junction, with mild right hydroureteronephrosis. Asymmetric right perinephric fat stranding. Two additional punctate 1-2 mm nonobstructing stones in the right kidney and solitary punctate nonobstructing 1 mm stone in the lower left kidney. No left hydronephrosis. No contour deforming renal masses. Normal caliber left ureter with no left ureteral stones. Normal bladder. Stomach/Bowel: Grossly normal stomach. Normal caliber small bowel with no small bowel wall thickening. Appendectomy. Normal large bowel with no diverticulosis, large bowel wall thickening or pericolonic fat stranding. Vascular/Lymphatic: Atherosclerotic nonaneurysmal abdominal aorta. No pathologically enlarged lymph nodes in the abdomen or pelvis. Reproductive: Mild prostatomegaly. Other: No pneumoperitoneum, ascites or focal fluid collection. Musculoskeletal: No aggressive appearing focal osseous lesions. Moderate degenerative changes in the visualized thoracolumbar spine. IMPRESSION: 1. Obstructing 3 mm stone in the distal right ureter approximately 2 cm above the right UVJ, with mild right hydroureteronephrosis. 2. Additional punctate nonobstructing stones in both kidneys. 3. Left lower lobe 4 mm pulmonary nodule. No follow-up needed if patient is low-risk. Non-contrast chest CT can be considered in 12 months if patient is high-risk. This recommendation follows the consensus statement: Guidelines for Management of Incidental Pulmonary Nodules Detected on CT Images:From the Fleischner Society 2017; published online before print (10.1148/radiol.SG:5268862). 4. Coronary atherosclerosis. 5. Mild prostatomegaly. Electronically Signed   By: Ilona Sorrel M.D.   On: 01/13/2016 21:45     Imaging Review No results found. I have personally reviewed and evaluated these images and lab results as part of my medical  decision-making.   EKG Interpretation None      MDM   Final diagnoses:  None    1. Right ureteral stone  The patient's pain has been managed in the ED after presenting for RLQ abdominal pain that radiated into right flank. Pain had resolved prior  to being evaluated. He has a completely nontender abdomen on exam.   He is found to have a right UVJ stone measuring 3 mm, elevated BUN and Cr. Discussed findings with the patient. He is given return precautions of fever, increasing pain or vomiting. He is advised to follow up with PCP for recheck of pain as well as repeat renal functions. Patient is felt stable for discharge home.     Charlann Lange, PA-C 01/14/16 [id number]  Veryl Speak, MD 01/15/16 [phone]

## 2016-01-13 NOTE — Discharge Instructions (Signed)
Dietary Guidelines to Help Prevent Kidney Stones °Your risk of kidney stones can be decreased by adjusting the foods you eat. The most important thing you can do is drink enough fluid. You should drink enough fluid to keep your urine clear or pale yellow. The following guidelines provide specific information for the type of kidney stone you have had. °GUIDELINES ACCORDING TO TYPE OF KIDNEY STONE °Calcium Oxalate Kidney Stones °· Reduce the amount of salt you eat. Foods that have a lot of salt cause your body to release excess calcium into your urine. The excess calcium can combine with a substance called oxalate to form kidney stones. °· Reduce the amount of animal protein you eat if the amount you eat is excessive. Animal protein causes your body to release excess calcium into your urine. Ask your dietitian how much protein from animal sources you should be eating. °· Avoid foods that are high in oxalates. If you take vitamins, they should have less than 500 mg of vitamin C. Your body turns vitamin C into oxalates. You do not need to avoid fruits and vegetables high in vitamin C. °Calcium Phosphate Kidney Stones °· Reduce the amount of salt you eat to help prevent the release of excess calcium into your urine. °· Reduce the amount of animal protein you eat if the amount you eat is excessive. Animal protein causes your body to release excess calcium into your urine. Ask your dietitian how much protein from animal sources you should be eating. °· Get enough calcium from food or take a calcium supplement (ask your dietitian for recommendations). Food sources of calcium that do not increase your risk of kidney stones include: °¨ Broccoli. °¨ Dairy products, such as cheese and yogurt. °¨ Pudding. °Uric Acid Kidney Stones °· Do not have more than 6 oz of animal protein per day. °FOOD SOURCES °Animal Protein Sources °· Meat (all types). °· Poultry. °· Eggs. °· Fish, seafood. °Foods High in Salt °· Salt seasonings. °· Soy  sauce. °· Teriyaki sauce. °· Cured and processed meats. °· Salted crackers and snack foods. °· Fast food. °· Canned soups and most canned foods. °Foods High in Oxalates °· Grains: °¨ Amaranth. °¨ Barley. °¨ Grits. °¨ Wheat germ. °¨ Bran. °¨ Buckwheat flour. °¨ All bran cereals. °¨ Pretzels. °¨ Whole wheat bread. °· Vegetables: °¨ Beans (wax). °¨ Beets and beet greens. °¨ Collard greens. °¨ Eggplant. °¨ Escarole. °¨ Leeks. °¨ Okra. °¨ Parsley. °¨ Rutabagas. °¨ Spinach. °¨ Swiss chard. °¨ Tomato paste. °¨ Fried potatoes. °¨ Sweet potatoes. °· Fruits: °¨ Red currants. °¨ Figs. °¨ Kiwi. °¨ Rhubarb. °· Meat and Other Protein Sources: °¨ Beans (dried). °¨ Soy burgers and other soybean products. °¨ Miso. °¨ Nuts (peanuts, almonds, pecans, cashews, hazelnuts). °¨ Nut butters. °¨ Sesame seeds and tahini (paste made of sesame seeds). °¨ Poppy seeds. °· Beverages: °¨ Chocolate drink mixes. °¨ Soy milk. °¨ Instant iced tea. °¨ Juices made from high-oxalate fruits or vegetables. °· Other: °¨ Carob. °¨ Chocolate. °¨ Fruitcake. °¨ Marmalades. °  °This information is not intended to replace advice given to you by your health care provider. Make sure you discuss any questions you have with your health care provider. °  °Document Released: 01/05/2011 Document Revised: 09/15/2013 Document Reviewed: 08/07/2013 °Elsevier Interactive Patient Education ©2016 Elsevier Inc. ° ° °Kidney Stones °Kidney stones (urolithiasis) are deposits that form inside your kidneys. The intense pain is caused by the stone moving through the urinary tract. When the stone moves, the   ureter goes into spasm around the stone. The stone is usually passed in the urine.  °CAUSES  °· A disorder that makes certain neck glands produce too much parathyroid hormone (primary hyperparathyroidism). °· A buildup of uric acid crystals, similar to gout in your joints. °· Narrowing (stricture) of the ureter. °· A kidney obstruction present at birth (congenital  obstruction). °· Previous surgery on the kidney or ureters. °· Numerous kidney infections. °SYMPTOMS  °· Feeling sick to your stomach (nauseous). °· Throwing up (vomiting). °· Blood in the urine (hematuria). °· Pain that usually spreads (radiates) to the groin. °· Frequency or urgency of urination. °DIAGNOSIS  °· Taking a history and physical exam. °· Blood or urine tests. °· CT scan. °· Occasionally, an examination of the inside of the urinary bladder (cystoscopy) is performed. °TREATMENT  °· Observation. °· Increasing your fluid intake. °· Extracorporeal shock wave lithotripsy--This is a noninvasive procedure that uses shock waves to break up kidney stones. °· Surgery may be needed if you have severe pain or persistent obstruction. There are various surgical procedures. Most of the procedures are performed with the use of small instruments. Only small incisions are needed to accommodate these instruments, so recovery time is minimized. °The size, location, and chemical composition are all important variables that will determine the proper choice of action for you. Talk to your health care provider to better understand your situation so that you will minimize the risk of injury to yourself and your kidney.  °HOME CARE INSTRUCTIONS  °· Drink enough water and fluids to keep your urine clear or pale yellow. This will help you to pass the stone or stone fragments. °· Strain all urine through the provided strainer. Keep all particulate matter and stones for your health care provider to see. The stone causing the pain may be as small as a grain of salt. It is very important to use the strainer each and every time you pass your urine. The collection of your stone will allow your health care provider to analyze it and verify that a stone has actually passed. The stone analysis will often identify what you can do to reduce the incidence of recurrences. °· Only take over-the-counter or prescription medicines for pain,  discomfort, or fever as directed by your health care provider. °· Keep all follow-up visits as told by your health care provider. This is important. °· Get follow-up X-rays if required. The absence of pain does not always mean that the stone has passed. It may have only stopped moving. If the urine remains completely obstructed, it can cause loss of kidney function or even complete destruction of the kidney. It is your responsibility to make sure X-rays and follow-ups are completed. Ultrasounds of the kidney can show blockages and the status of the kidney. Ultrasounds are not associated with any radiation and can be performed easily in a matter of minutes. °· Make changes to your daily diet as told by your health care provider. You may be told to: °¨ Limit the amount of salt that you eat. °¨ Eat 5 or more servings of fruits and vegetables each day. °¨ Limit the amount of meat, poultry, fish, and eggs that you eat. °· Collect a 24-hour urine sample as told by your health care provider. You may need to collect another urine sample every 6-12 months. °SEEK MEDICAL CARE IF: °· You experience pain that is progressive and unresponsive to any pain medicine you have been prescribed. °SEEK IMMEDIATE MEDICAL CARE IF:  °·   Pain cannot be controlled with the prescribed medicine. °· You have a fever or shaking chills. °· The severity or intensity of pain increases over 18 hours and is not relieved by pain medicine. °· You develop a new onset of abdominal pain. °· You feel faint or pass out. °· You are unable to urinate. °  °This information is not intended to replace advice given to you by your health care provider. Make sure you discuss any questions you have with your health care provider. °  °Document Released: 09/10/2005 Document Revised: 06/01/2015 Document Reviewed: 02/11/2013 °Elsevier Interactive Patient Education ©2016 Elsevier Inc. ° °

## 2016-01-31 ENCOUNTER — Encounter: Payer: Self-pay | Admitting: Cardiovascular Disease

## 2016-05-29 ENCOUNTER — Other Ambulatory Visit: Payer: Self-pay

## 2016-08-02 ENCOUNTER — Encounter: Payer: Self-pay | Admitting: Cardiovascular Disease

## 2016-10-31 ENCOUNTER — Encounter: Payer: Self-pay | Admitting: Cardiovascular Disease

## 2016-10-31 ENCOUNTER — Ambulatory Visit (INDEPENDENT_AMBULATORY_CARE_PROVIDER_SITE_OTHER): Payer: Medicare Other | Admitting: Cardiovascular Disease

## 2016-10-31 VITALS — BP 140/60 | HR 74 | Ht 70.0 in | Wt 148.8 lb

## 2016-10-31 DIAGNOSIS — I10 Essential (primary) hypertension: Secondary | ICD-10-CM

## 2016-10-31 DIAGNOSIS — E785 Hyperlipidemia, unspecified: Secondary | ICD-10-CM | POA: Diagnosis not present

## 2016-10-31 NOTE — Patient Instructions (Signed)

## 2016-10-31 NOTE — Progress Notes (Signed)
Cardiology Office Note Date:  10/31/2016   ID:  Shawn Bauer, DOB [birth year], MRN [id number]  PCP:  [name], MD  Cardiologist:  Sherren Mocha, MD    Chief Complaint  Patient presents with  . Coronary atherosclerosis     History of Present Illness: Shawn Bauer is a 83 y.o. male who presents for follow-up of coronary artery disease and remote CABG. His last nuclear stress test in 2014 was low risk with a small area of inferior ischemia.  This was performed to evaluate chest discomfort that occurred while shoveling snow. Medical therapy was recommended.  He's had no further symptoms of angina since that time.  He is here alone today. He's doing quite well. He remains physically active. He did a lot of hunting over the plantar and he is able to walk through the woods and climb hills without symptoms. He did have one episode when his dog got away from him and he had to chase them. He experienced some chest discomfort with this. This resolved quickly with rest. He has not taken nitroglycerin over the last year. He has no symptoms with normal activities. Denies dyspnea, edema, orthopnea, or PND.   Past Medical History:  Diagnosis Date  . Asthma   . CAD (coronary artery disease)    s/p CABG  . Degenerative disk disease    cervical/spinal stenosis  . HTN (hypertension)   . Hyperlipidemia     Past Surgical History:  Procedure Laterality Date  . APPENDECTOMY    . BACK SURGERY    . bilateral inguinal hernia repair    . CORONARY ARTERY BYPASS GRAFT  2001   after positive exercise treadmill test.  LIMA-LAD, Radial artery to LCx marginal, SVG to second diagonal, SVG to PDA  . TONSILLECTOMY AND ADENOIDECTOMY      Current Outpatient Prescriptions  Medication Sig Dispense Refill  . aspirin 81 MG tablet Take 81 mg by mouth daily.    . budesonide-formoterol (SYMBICORT) 160-4.5 MCG/ACT inhaler Inhale 2 puffs into the lungs 2 (two) times daily.    . cyanocobalamin 1000 MCG tablet Take  1,000 mcg by mouth daily.    Marland Kitchen losartan (COZAAR) 50 MG tablet Take 50 mg by mouth daily.    Marland Kitchen lovastatin (MEVACOR) 40 MG tablet Take 2 tablets once a day    . metoprolol succinate (TOPROL-XL) 50 MG 24 hr tablet Take 50 mg by mouth daily. Patient taking 1/2 tab in the morning and 1/2 tab in the evening. Total 50 mg per day. Take with or immediately following a meal.     No current facility-administered medications for this visit.     Allergies:   Patient has no known allergies.   Social History:  The patient  reports that he has quit smoking. He has quit using smokeless tobacco. He reports that he does not drink alcohol or use drugs.   Family History:  The patient's  family history includes Heart attack in his brother; Heart attack (age of onset: 57) in his father.    ROS:  Please see the history of present illness.  Otherwise, review of systems is positive for back pain, easy bruising.  All other systems are reviewed and negative.    PHYSICAL EXAM: VS:  BP 140/60   Pulse 74   Ht 5\' 10"  (1.778 m)   Wt 148 lb 12.8 oz (67.5 kg)   BMI 21.35 kg/m  , BMI Body mass index is 21.35 kg/m. GEN: Well nourished, well developed,  in no acute distress  HEENT: normal  Neck: no JVD, no masses. No carotid bruits Cardiac: RRR without murmur or gallop                Respiratory:  clear to auscultation bilaterally, normal work of breathing GI: soft, nontender, nondistended, + BS MS: no deformity or atrophy  Ext: no pretibial edema, pedal pulses 2+= bilaterally Skin: warm and dry, no rash Neuro:  Strength and sensation are intact Psych: euthymic mood, full affect  EKG:  EKG is ordered today. The ekg ordered today shows NSR 74 bpm, first degree AV block, otherwise within normal limits  Recent Labs: 01/13/2016: ALT 20; BUN 23; Creatinine, Ser 1.71; Hemoglobin 15.0; Platelets 178; Potassium 4.7; Sodium 141   Lipid Panel  No results found for: CHOL, TRIG, HDL, CHOLHDL, VLDL, LDLCALC, LDLDIRECT     Wt Readings from Last 3 Encounters:  10/31/16 148 lb 12.8 oz (67.5 kg)  01/13/16 145 lb (65.8 kg)  08/08/15 148 lb 3.2 oz (67.2 kg)     ASSESSMENT AND PLAN: 1.  CAD, native vessel, s/p remote CABG:The patient is doing well on his current medical program. He has very rare episodes of angina with heavy physical exertion and has not required any nitroglycerin. He will continue on his current medical program. He was counseled today to seek immediate attention if he develops any progressive anginal symptoms.    2. Essential HTN: blood pressure is controlled on losartan and metoprolol succinate   3. Hyperlipidemia:  treated with lovastatin. Lipids followed by primary physician.  Current medicines are reviewed with the patient today.  The patient does not have concerns regarding medicines.  Labs/ tests ordered today include:   Orders Placed This Encounter  Procedures  . EKG 12-Lead    Disposition:   FU one year  Signed, Sherren Mocha, MD  10/31/2016 1:57 PM    [facility] [place], [place], [place]  [address] Phone: [phone]; Fax: [phone]

## 2016-12-04 IMAGING — CT CT RENAL STONE PROTOCOL
2 of 4 series · 11 of 46 positions shown, 12 images · non-contrast
Comparison: None.

CLINICAL DATA: Right flank pain. Prior appendectomy and bilateral
inguinal hernia repair.

EXAM:
CT ABDOMEN AND PELVIS WITHOUT CONTRAST
TECHNIQUE: Multidetector CT imaging of the abdomen and pelvis was performed
following the standard protocol without IV contrast.

[Series 201: stone study, idose (2) · axial · 0.73mm/px · z∈[-8,+337]mm · 8 of 89 slices shown, 9 images]
[im 10/89  soft-tissue]
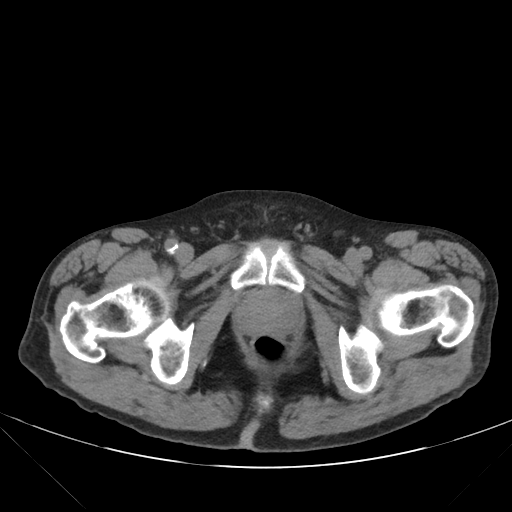
[im 10/89  bone]
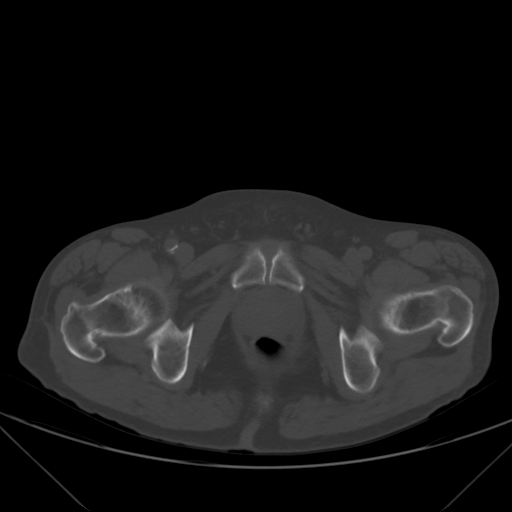
[im 19/89  soft-tissue]
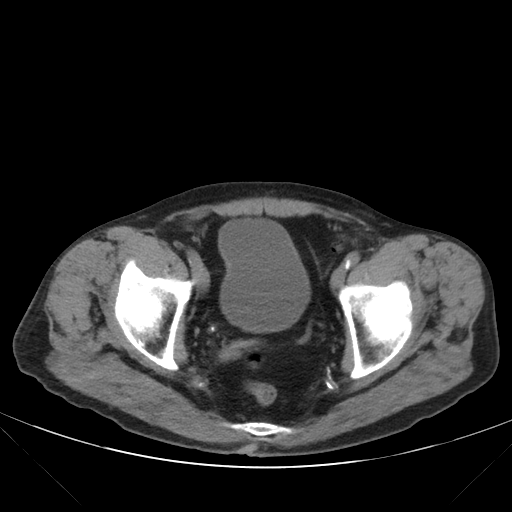
[im 28/89  soft-tissue]
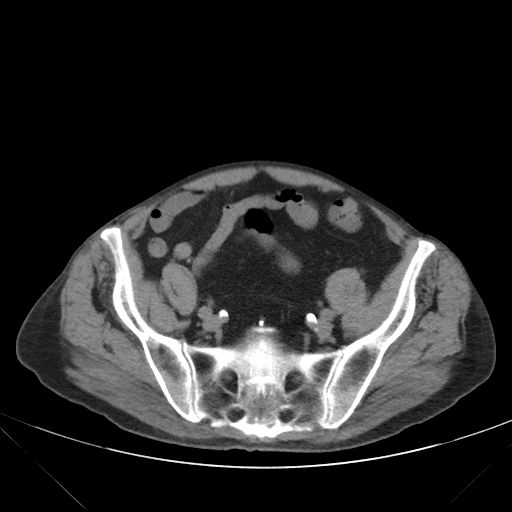
[im 38/89  soft-tissue]
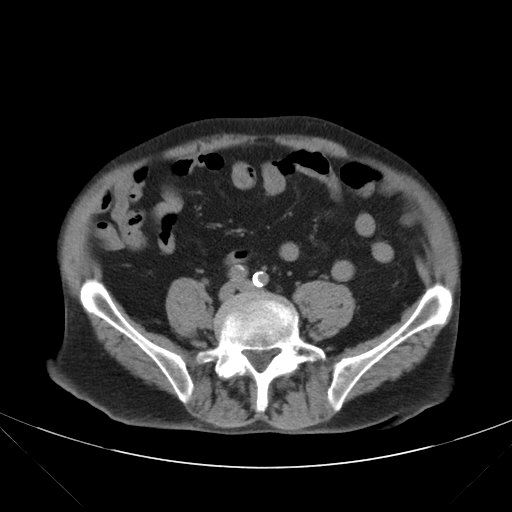
[im 51/89  soft-tissue]
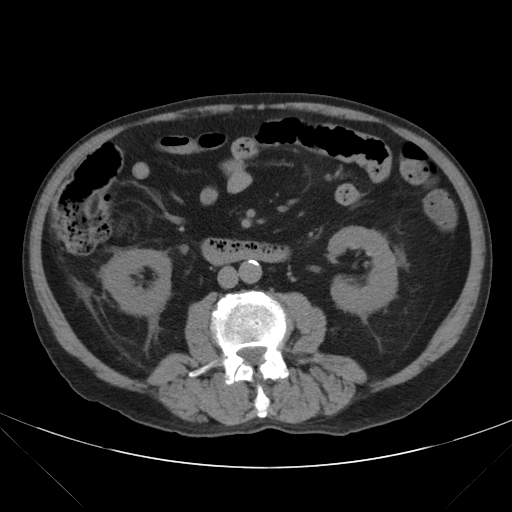
[im 61/89  soft-tissue]
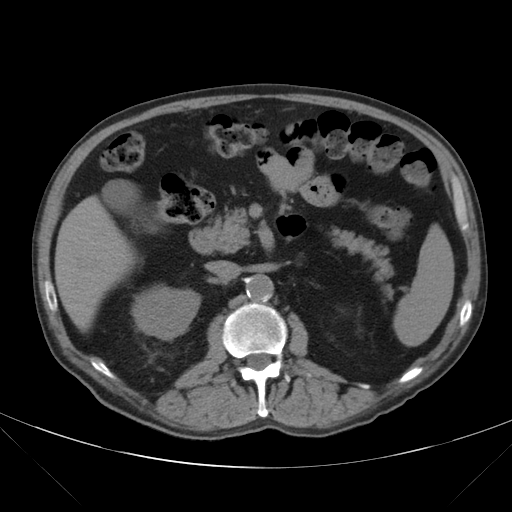
[im 70/89  soft-tissue]
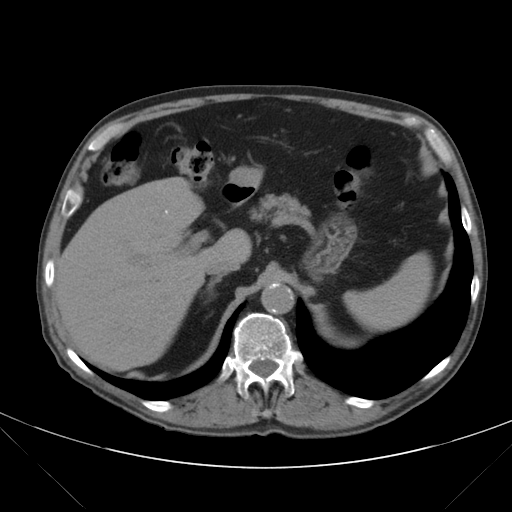
[im 79/89  soft-tissue]
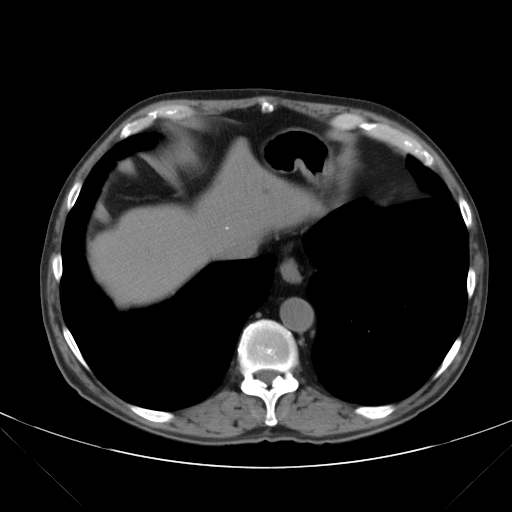

[Series 203: coronals, idose (2) · coronal · 0.45mm/px · 3 of 119 slices shown]
[im 40/119  soft-tissue]
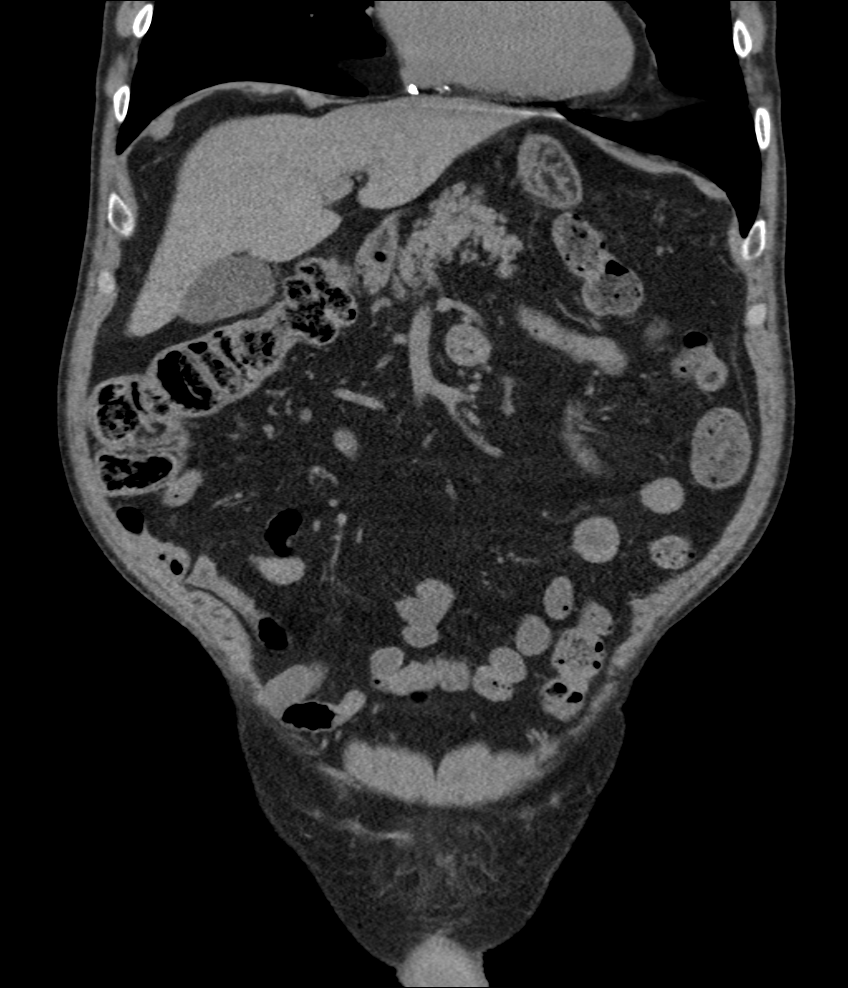
[im 53/119  soft-tissue]
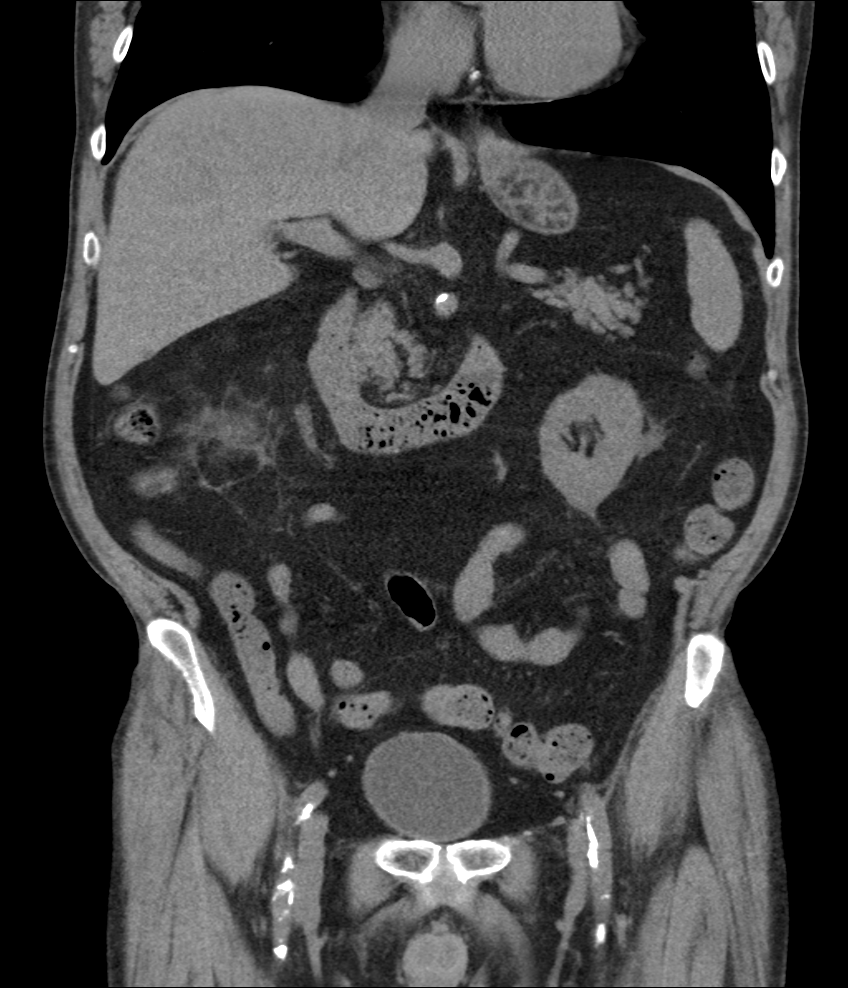
[im 66/119  soft-tissue]
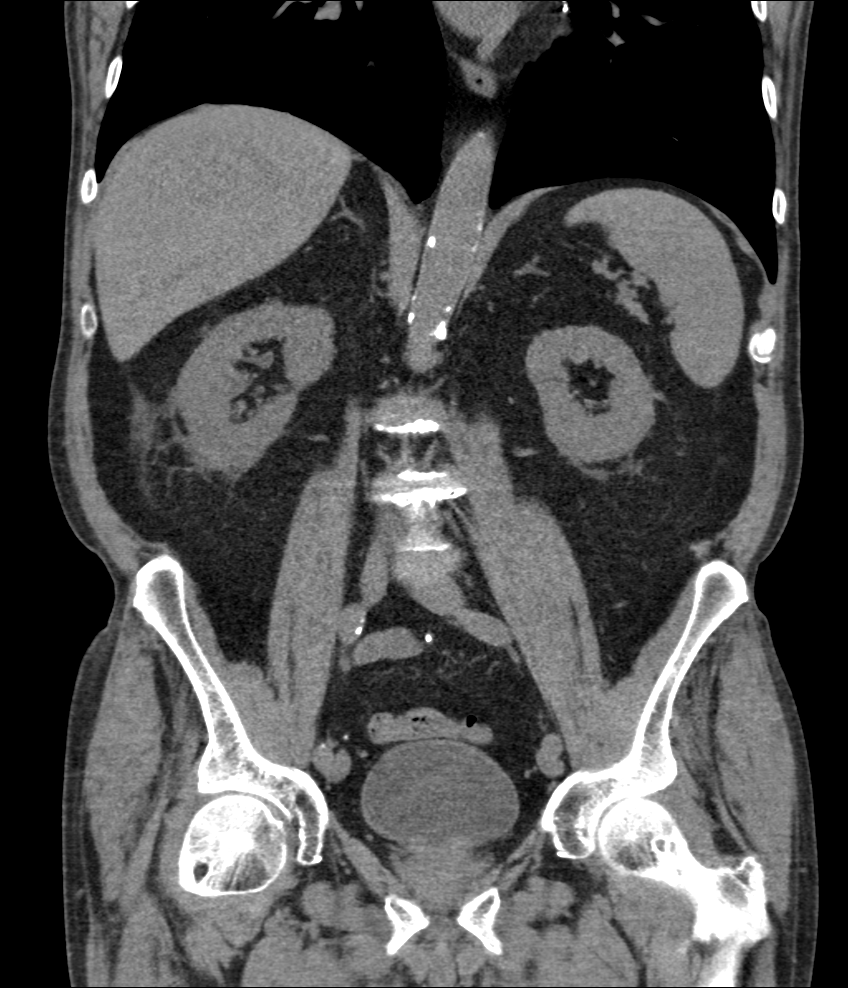

[11 of 46 positions shown; findings below may reference images not displayed]

FINDINGS: Lower chest: Left lower lobe 4 mm solid pulmonary nodule (series
205/ image 41). Visualized lower median sternotomy wire appears
intact. Coronary atherosclerosis.

Hepatobiliary: Simple 1.1 cm anterior left lower lobe cyst.
Additional subcentimeter hypodense lateral segment left liver lobe
lesion, too small to characterize. A few scattered granulomatous
liver calcifications. Otherwise normal liver. Normal gallbladder
with no radiopaque cholelithiasis. No biliary ductal dilatation.

Pancreas: Normal, with no mass or duct dilation.

Spleen: Normal size. No mass.

Adrenals/Urinary Tract: Normal adrenals. Obstructing 3 mm stone in
the distal right ureter approximately 2 cm above the right
ureterovesical junction, with mild right hydroureteronephrosis.
Asymmetric right perinephric fat stranding. Two additional punctate
1-2 mm nonobstructing stones in the right kidney and solitary
punctate nonobstructing 1 mm stone in the lower left kidney. No left
hydronephrosis. No contour deforming renal masses. Normal caliber
left ureter with no left ureteral stones. Normal bladder.

Stomach/Bowel: Grossly normal stomach. Normal caliber small bowel
with no small bowel wall thickening. Appendectomy. Normal large
bowel with no diverticulosis, large bowel wall thickening or
pericolonic fat stranding.

Vascular/Lymphatic: Atherosclerotic nonaneurysmal abdominal aorta.
No pathologically enlarged lymph nodes in the abdomen or pelvis.

Reproductive: Mild prostatomegaly.

Other: No pneumoperitoneum, ascites or focal fluid collection.

Musculoskeletal: No aggressive appearing focal osseous lesions.
Moderate degenerative changes in the visualized thoracolumbar spine.
IMPRESSION: 1. Obstructing 3 mm stone in the distal right ureter approximately 2
cm above the right UVJ, with mild right hydroureteronephrosis.
2. Additional punctate nonobstructing stones in both kidneys.
3. Left lower lobe 4 mm pulmonary nodule. No follow-up needed if
patient is low-risk. Non-contrast chest CT can be considered in 12
months if patient is high-risk. This recommendation follows the
consensus statement: Guidelines for Management of Incidental
Pulmonary Nodules Detected on CT Images:From the [HOSPITAL]
3247; published online before print (10.1148/radiol.3596959547).
4. Coronary atherosclerosis.
5. Mild prostatomegaly.

## 2017-11-08 ENCOUNTER — Encounter (INDEPENDENT_AMBULATORY_CARE_PROVIDER_SITE_OTHER): Payer: Self-pay

## 2017-11-08 ENCOUNTER — Encounter: Payer: Self-pay | Admitting: Cardiovascular Disease

## 2017-11-08 ENCOUNTER — Ambulatory Visit (INDEPENDENT_AMBULATORY_CARE_PROVIDER_SITE_OTHER): Payer: Medicare Other | Admitting: Cardiovascular Disease

## 2017-11-08 VITALS — BP 118/80 | HR 72 | Ht 70.0 in | Wt 142.8 lb

## 2017-11-08 DIAGNOSIS — E782 Mixed hyperlipidemia: Secondary | ICD-10-CM | POA: Diagnosis not present

## 2017-11-08 DIAGNOSIS — I2510 Atherosclerotic heart disease of native coronary artery without angina pectoris: Secondary | ICD-10-CM | POA: Diagnosis not present

## 2017-11-08 DIAGNOSIS — I10 Essential (primary) hypertension: Secondary | ICD-10-CM | POA: Diagnosis not present

## 2017-11-08 NOTE — Progress Notes (Signed)
Cardiology Office Note Date:  11/08/2017   ID:  Shawn Bauer, DOB 1934/06/21, MRN [id number]  PCP:  [name], MD  Cardiologist:  Sherren Mocha, MD    Chief Complaint  Patient presents with  . Cough     History of Present Illness: Shawn Bauer is a 84 y.o. male who presents for follow-up of coronary artery disease and remote CABG.  Patient was last seen in November 2016.  He's here alone today. States 'I never thought I'd live this long.' He is still able to get out in the woods and hunt. He has had a recent URI and cough, now recovering. Denies chest pain or pressure. No edema, orthopnea, or PND.   Past Medical History:  Diagnosis Date  . Asthma   . CAD (coronary artery disease)    s/p CABG  . Degenerative disk disease    cervical/spinal stenosis  . HTN (hypertension)   . Hyperlipidemia     Past Surgical History:  Procedure Laterality Date  . APPENDECTOMY    . BACK SURGERY    . bilateral inguinal hernia repair    . CORONARY ARTERY BYPASS GRAFT  2001   after positive exercise treadmill test.  LIMA-LAD, Radial artery to LCx marginal, SVG to second diagonal, SVG to PDA  . TONSILLECTOMY AND ADENOIDECTOMY      Current Outpatient Medications  Medication Sig Dispense Refill  . aspirin 81 MG tablet Take 81 mg by mouth daily.    . budesonide-formoterol (SYMBICORT) 160-4.5 MCG/ACT inhaler Inhale 2 puffs into the lungs 2 (two) times daily.    . cyanocobalamin 1000 MCG tablet Take 1,000 mcg by mouth daily.    Marland Kitchen losartan (COZAAR) 50 MG tablet Take 50 mg by mouth daily.    Marland Kitchen lovastatin (MEVACOR) 40 MG tablet Take 2 tablets once a day    . metoprolol succinate (TOPROL-XL) 50 MG 24 hr tablet Take 50 mg by mouth daily. Patient taking 1/2 tab in the morning and 1/2 tab in the evening. Total 50 mg per day. Take with or immediately following a meal.     No current facility-administered medications for this visit.     Allergies:   Patient has no known allergies.   Social  History:  The patient  reports that he has quit smoking. He has quit using smokeless tobacco. He reports that he does not drink alcohol or use drugs.   Family History:  The patient's family history includes Heart attack in his brother; Heart attack (age of onset: 2) in his father.    ROS:  Please see the history of present illness.  Otherwise, review of systems is positive for cough, exertional dyspnea.  All other systems are reviewed and negative.    PHYSICAL EXAM: VS:  BP 118/80   Pulse 72   Ht 5\' 10"  (1.778 m)   Wt 142 lb 12.8 oz (64.8 kg)   BMI 20.49 kg/m  , BMI Body mass index is 20.49 kg/m. GEN: Pleasant elderly male, in no acute distress  HEENT: normal  Neck: no JVD, no masses. No carotid bruits Cardiac: RRR without murmur or gallop     Respiratory:  clear to auscultation bilaterally, normal work of breathing GI: soft, nontender, nondistended, + BS MS: no deformity or atrophy  Ext: no pretibial edema, pedal pulses 2+= bilaterally Skin: warm and dry, no rash Neuro:  Strength and sensation are intact Psych: euthymic mood, full affect  EKG:  EKG is ordered today. The ekg ordered today  shows NSR 72 bpm, first degree AV block, otherwise within normal limits  Recent Labs: No results found for requested labs within last 8760 hours.   Lipid Panel  No results found for: CHOL, TRIG, HDL, CHOLHDL, VLDL, LDLCALC, LDLDIRECT    Wt Readings from Last 3 Encounters:  11/08/17 142 lb 12.8 oz (64.8 kg)  10/31/16 148 lb 12.8 oz (67.5 kg)  01/13/16 145 lb (65.8 kg)    ASSESSMENT AND PLAN: 1.  Coronary artery disease, native vessel, status post CABG: Patient is treated with aspirin, statin drug, and a beta-blocker.  He is having no anginal chest pain.  2.  Essential hypertension: The patient's blood pressure is well controlled on his current medicines  3.  Hyperlipidemia: Followed by his primary physician.  Current medicines are reviewed with the patient today.  The patient does  not have concerns regarding medicines.  Labs/ tests ordered today include:  No orders of the defined types were placed in this encounter.   Disposition: Patient appears stable from a cardiac perspective and he will FU in one year  Signed, Sherren Mocha, MD  11/08/2017 2:26 PM    [address], [place], [place]  [address] Phone: [phone]; Fax: [phone]

## 2017-11-08 NOTE — Patient Instructions (Signed)

## 2018-08-05 ENCOUNTER — Telehealth: Payer: Self-pay | Admitting: Cardiovascular Disease

## 2018-08-05 NOTE — Telephone Encounter (Signed)
Patient is calling to schedule his February follow up appt with Dr. Burt Knack. No appts are available, please call to schedule.

## 2018-08-05 NOTE — Telephone Encounter (Signed)
Pt called our office to schedule his one year OV with Dr. Burt Knack. The schedulers took a message since they were not able to find an appointment time within his time frame and sent the message to triage..  I advised the pt that Dr. Antionette Char nurse/ Valetta Fuller is out of the office today but I will forward his request to her since she is most familiar with Dr. Antionette Char clinical schedule and she will give him a call back.... Pt verbalized understanding.

## 2018-08-06 NOTE — Telephone Encounter (Signed)
Scheduled patient 10/30/18 at 1400. Patient was grateful for assistance.

## 2018-10-30 ENCOUNTER — Ambulatory Visit (INDEPENDENT_AMBULATORY_CARE_PROVIDER_SITE_OTHER): Payer: Medicare Other | Admitting: Cardiovascular Disease

## 2018-10-30 ENCOUNTER — Encounter: Payer: Self-pay | Admitting: Cardiovascular Disease

## 2018-10-30 VITALS — BP 128/68 | HR 82 | Ht 69.5 in | Wt 143.1 lb

## 2018-10-30 DIAGNOSIS — E782 Mixed hyperlipidemia: Secondary | ICD-10-CM

## 2018-10-30 DIAGNOSIS — I10 Essential (primary) hypertension: Secondary | ICD-10-CM | POA: Diagnosis not present

## 2018-10-30 DIAGNOSIS — I2510 Atherosclerotic heart disease of native coronary artery without angina pectoris: Secondary | ICD-10-CM

## 2018-10-30 NOTE — Progress Notes (Signed)
Cardiology Office Note:    Date:  10/30/2018   ID:  LARONE KLIETHERMES, DOB 1934-07-31, MRN [id number]  PCP:  [name], MD  Cardiologist:  Sherren Mocha, MD  Electrophysiologist:  None   Referring MD: [name], MD   Chief Complaint  Patient presents with  . Coronary Artery Disease    History of Present Illness:    Shawn Bauer is a 85 y.o. male with a hx of coronary artery disease with remote CABG in 2001, presenting for follow-up evaluation.  The patient is here alone today.  He is been getting along fairly well.  He does have some limitation from exertional dyspnea but feels this is unchanged over time.  He is also limited by low back pain.  He had a discectomy remotely and has not undergone formal evaluation of his back problems in several years.  He denies chest pain, chest pressure, edema, orthopnea, PND, or heart palpitations.  He is still able to get out in the woods and do a little bit of hunting.  Past Medical History:  Diagnosis Date  . Asthma   . CAD (coronary artery disease)    s/p CABG  . Degenerative disk disease    cervical/spinal stenosis  . HTN (hypertension)   . Hyperlipidemia     Past Surgical History:  Procedure Laterality Date  . APPENDECTOMY    . BACK SURGERY    . bilateral inguinal hernia repair    . CORONARY ARTERY BYPASS GRAFT  2001   after positive exercise treadmill test.  LIMA-LAD, Radial artery to LCx marginal, SVG to second diagonal, SVG to PDA  . TONSILLECTOMY AND ADENOIDECTOMY      Current Medications: No outpatient medications have been marked as taking for the 10/30/18 encounter (Office Visit) with Sherren Mocha, MD.     Allergies:   Patient has no known allergies.   Social History   Socioeconomic History  . Marital status: Married    Spouse name: Not on file  . Number of children: 3  . Years of education: Not on file  . Highest education level: Not on file  Occupational History  . Occupation: [employer]    Social Needs  . Financial resource strain: Not on file  . Food insecurity:    Worry: Not on file    Inability: Not on file  . Transportation needs:    Medical: Not on file    Non-medical: Not on file  Tobacco Use  . Smoking status: Former Research scientist (life sciences)  . Smokeless tobacco: Former Network engineer and Sexual Activity  . Alcohol use: No  . Drug use: No  . Sexual activity: Not on file  Lifestyle  . Physical activity:    Days per week: Not on file    Minutes per session: Not on file  . Stress: Not on file  Relationships  . Social connections:    Talks on phone: Not on file    Gets together: Not on file    Attends religious service: Not on file    Active member of club or organization: Not on file    Attends meetings of clubs or organizations: Not on file    Relationship status: Not on file  Other Topics Concern  . Not on file  Social History Narrative  . Not on file     Family History: The patient's family history includes Heart attack in his brother; Heart attack (age of onset: 104) in his father.  ROS:   Please  see the history of present illness.    Positive for exertional dyspnea, back pain, easy bruising.  All other systems reviewed and are negative.  EKGs/Labs/Other Studies Reviewed:    The following studies were reviewed today:   EKG:  EKG is ordered today.  The ekg ordered today demonstrates normal sinus rhythm with first-degree AV block, heart rate 82 bpm, otherwise normal EKG  Recent Labs: No results found for requested labs within last 8760 hours.  Recent Lipid Panel No results found for: CHOL, TRIG, HDL, CHOLHDL, VLDL, LDLCALC, LDLDIRECT  Physical Exam:    VS:  BP 128/68   Pulse 82   Ht 5' 9.5" (1.765 m)   Wt 143 lb 1.9 oz (64.9 kg)   SpO2 97%   BMI 20.83 kg/m     Wt Readings from Last 3 Encounters:  10/30/18 143 lb 1.9 oz (64.9 kg)  11/08/17 142 lb 12.8 oz (64.8 kg)  10/31/16 148 lb 12.8 oz (67.5 kg)     GEN:  Well nourished, well developed in no  acute distress HEENT: Normal NECK: No JVD; No carotid bruits LYMPHATICS: No lymphadenopathy CARDIAC: RRR, no murmurs, rubs, gallops RESPIRATORY:  Clear to auscultation without rales, wheezing or rhonchi  ABDOMEN: Soft, non-tender, non-distended MUSCULOSKELETAL:  No edema; No deformity  SKIN: Warm and dry NEUROLOGIC:  Alert and oriented x 3 PSYCHIATRIC:  Normal affect   ASSESSMENT:    1. Coronary artery disease involving native coronary artery of native heart without angina pectoris   2. Essential hypertension   3. Mixed hyperlipidemia    PLAN:    In order of problems listed above:  1. The patient appears stable after remote CABG 19 years ago.  He is having no anginal symptoms.  He will continue on aspirin, statin drug, and a beta-blocker. 2. Blood pressure is well controlled on metoprolol and olmesartan 3. The patient is treated with lovastatin.  He has lipids scheduled next week through his primary care physician.  His annual wellness exam follows his labs.  For follow-up I will see him back in 1 year or sooner if any problems arise.   Medication Adjustments/Labs and Tests Ordered: Current medicines are reviewed at length with the patient today.  Concerns regarding medicines are outlined above.  No orders of the defined types were placed in this encounter.  No orders of the defined types were placed in this encounter.   There are no Patient Instructions on file for this visit.   Signed, Sherren Mocha, MD  10/30/2018 2:01 PM    [facility]

## 2018-10-30 NOTE — Patient Instructions (Signed)
Medication Instructions:  Your provider recommends that you continue on your current medications as directed. Please refer to the Current Medication list given to you today.    Labwork: None  Testing/Procedures: None  Follow-Up: Your provider wants you to follow-up in: 1 year with Dr. Cooper. You will receive a reminder letter in the mail two months in advance. If you don't receive a letter, please call our office to schedule the follow-up appointment.    

## 2018-12-08 ENCOUNTER — Other Ambulatory Visit: Payer: Self-pay

## 2018-12-08 DIAGNOSIS — Z7982 Long term (current) use of aspirin: Secondary | ICD-10-CM | POA: Insufficient documentation

## 2018-12-08 DIAGNOSIS — N189 Chronic kidney disease, unspecified: Secondary | ICD-10-CM | POA: Insufficient documentation

## 2018-12-08 DIAGNOSIS — Z79899 Other long term (current) drug therapy: Secondary | ICD-10-CM | POA: Insufficient documentation

## 2018-12-08 DIAGNOSIS — C9000 Multiple myeloma not having achieved remission: Secondary | ICD-10-CM | POA: Diagnosis not present

## 2018-12-08 DIAGNOSIS — I4892 Unspecified atrial flutter: Secondary | ICD-10-CM | POA: Insufficient documentation

## 2019-04-27 ENCOUNTER — Other Ambulatory Visit: Payer: Self-pay

## 2019-08-14 ENCOUNTER — Other Ambulatory Visit: Payer: Self-pay

## 2019-08-18 ENCOUNTER — Telehealth: Payer: Self-pay | Admitting: Family

## 2019-08-18 NOTE — Telephone Encounter (Signed)
Spoke with patient to confirm new patient appt 12/8 at 1 pm

## 2019-08-31 ENCOUNTER — Other Ambulatory Visit: Payer: Self-pay | Admitting: Family

## 2019-08-31 DIAGNOSIS — C9000 Multiple myeloma not having achieved remission: Secondary | ICD-10-CM

## 2019-09-01 ENCOUNTER — Encounter: Payer: Self-pay | Admitting: Family

## 2019-09-01 ENCOUNTER — Other Ambulatory Visit: Payer: Self-pay

## 2019-09-01 ENCOUNTER — Inpatient Hospital Stay: Payer: Medicare Other

## 2019-09-01 ENCOUNTER — Inpatient Hospital Stay: Payer: Medicare Other | Attending: Family | Admitting: Family

## 2019-09-01 VITALS — BP 131/59 | HR 70 | Temp 97.8°F | Resp 18 | Ht 69.5 in | Wt 138.0 lb

## 2019-09-01 DIAGNOSIS — Z7982 Long term (current) use of aspirin: Secondary | ICD-10-CM | POA: Insufficient documentation

## 2019-09-01 DIAGNOSIS — R5383 Other fatigue: Secondary | ICD-10-CM | POA: Diagnosis not present

## 2019-09-01 DIAGNOSIS — R778 Other specified abnormalities of plasma proteins: Secondary | ICD-10-CM

## 2019-09-01 DIAGNOSIS — I129 Hypertensive chronic kidney disease with stage 1 through stage 4 chronic kidney disease, or unspecified chronic kidney disease: Secondary | ICD-10-CM | POA: Diagnosis not present

## 2019-09-01 DIAGNOSIS — I2510 Atherosclerotic heart disease of native coronary artery without angina pectoris: Secondary | ICD-10-CM | POA: Diagnosis not present

## 2019-09-01 DIAGNOSIS — C9000 Multiple myeloma not having achieved remission: Secondary | ICD-10-CM | POA: Diagnosis present

## 2019-09-01 DIAGNOSIS — Z79899 Other long term (current) drug therapy: Secondary | ICD-10-CM | POA: Insufficient documentation

## 2019-09-01 DIAGNOSIS — N183 Chronic kidney disease, stage 3 unspecified: Secondary | ICD-10-CM | POA: Insufficient documentation

## 2019-09-01 DIAGNOSIS — M1990 Unspecified osteoarthritis, unspecified site: Secondary | ICD-10-CM | POA: Insufficient documentation

## 2019-09-01 DIAGNOSIS — E785 Hyperlipidemia, unspecified: Secondary | ICD-10-CM | POA: Insufficient documentation

## 2019-09-01 DIAGNOSIS — Z87891 Personal history of nicotine dependence: Secondary | ICD-10-CM | POA: Insufficient documentation

## 2019-09-01 DIAGNOSIS — Z951 Presence of aortocoronary bypass graft: Secondary | ICD-10-CM | POA: Insufficient documentation

## 2019-09-01 DIAGNOSIS — Z7951 Long term (current) use of inhaled steroids: Secondary | ICD-10-CM | POA: Insufficient documentation

## 2019-09-01 LAB — CBC WITH DIFFERENTIAL (CANCER CENTER ONLY)
Abs Immature Granulocytes: 0.03 10*3/uL (ref 0.00–0.07)
Basophils Absolute: 0.1 10*3/uL (ref 0.0–0.1)
Basophils Relative: 1 %
Eosinophils Absolute: 0.2 10*3/uL (ref 0.0–0.5)
Eosinophils Relative: 2 %
HCT: 44.3 % (ref 39.0–52.0)
Hemoglobin: 14.6 g/dL (ref 13.0–17.0)
Immature Granulocytes: 0 %
Lymphocytes Relative: 26 %
Lymphs Abs: 2.1 10*3/uL (ref 0.7–4.0)
MCH: 33 pg (ref 26.0–34.0)
MCHC: 33 g/dL (ref 30.0–36.0)
MCV: 100 fL (ref 80.0–100.0)
Monocytes Absolute: 0.7 10*3/uL (ref 0.1–1.0)
Monocytes Relative: 9 %
Neutro Abs: 5.1 10*3/uL (ref 1.7–7.7)
Neutrophils Relative %: 62 %
Platelet Count: 215 10*3/uL (ref 150–400)
RBC: 4.43 MIL/uL (ref 4.22–5.81)
RDW: 12.5 % (ref 11.5–15.5)
WBC Count: 8.2 10*3/uL (ref 4.0–10.5)
nRBC: 0 % (ref 0.0–0.2)

## 2019-09-01 LAB — CMP (CANCER CENTER ONLY)
ALT: 12 U/L (ref 0–44)
AST: 13 U/L — ABNORMAL LOW (ref 15–41)
Albumin: 4.1 g/dL (ref 3.5–5.0)
Alkaline Phosphatase: 55 U/L (ref 38–126)
Anion gap: 7 (ref 5–15)
BUN: 21 mg/dL (ref 8–23)
CO2: 30 mmol/L (ref 22–32)
Calcium: 9.3 mg/dL (ref 8.9–10.3)
Chloride: 105 mmol/L (ref 98–111)
Creatinine: 1.8 mg/dL — ABNORMAL HIGH (ref 0.61–1.24)
GFR, Est AFR Am: 39 mL/min — ABNORMAL LOW (ref >60)
GFR, Estimated: 34 mL/min — ABNORMAL LOW (ref >60)
Glucose, Bld: 111 mg/dL — ABNORMAL HIGH (ref 70–99)
Potassium: 4.2 mmol/L (ref 3.5–5.1)
Sodium: 142 mmol/L (ref 135–145)
Total Bilirubin: 0.7 mg/dL (ref 0.3–1.2)
Total Protein: 6.3 g/dL — ABNORMAL LOW (ref 6.5–8.1)

## 2019-09-01 LAB — SAVE SMEAR(SSMR), FOR PROVIDER SLIDE REVIEW

## 2019-09-01 LAB — LACTATE DEHYDROGENASE: LDH: 167 U/L (ref 98–192)

## 2019-09-01 NOTE — Progress Notes (Signed)
Hematology/Oncology Consultation   Name: Shawn Bauer      MRN: [id number]    Location: Room/bed info not found  Date: 09/01/2019 Time:1:12 PM   REFERRING PHYSICIAN: Arlys John, MD  REASON FOR CONSULT: Multiple myeloma    DIAGNOSIS: Lab work-up pending  HISTORY OF PRESENT ILLNESS: Shawn Bauer is a very pleasant 85 yo caucasian gentleman with recent increase in fatigue.  In November, he had a serum M-spike of 0.2. Creatinine was 2.0 and BUN was 17.  Myeloma studies are pending. Creatinine today is 1.8 and BUN is 21.  He denies having had any hot flashes or night sweats.  He has chronic neck and back issues due to arthritis and a bulging disc. No new pain issues.  He states that there is no personal or familial history of cancer.  He denies having diabetes or thyroid disease.  No fever, chills, n/v, cough, rash, dizziness, headache, vision changes, chest pain, palpitations, abdominal pain or changes in bowel or bladder habits.  He has occasional SOB associated with exacerbation of asthma.  No swelling, tenderness, numbness or tingling in his extremities.  No syncopal episodes.  He states that he has a good appetite and is staying well hydrated. His weight has remained stable.  He quit smoking in 1965. He does not drink alcoholic beverages.   ROS: All other 10 point review of systems is negative.   PAST MEDICAL HISTORY:   Past Medical History:  Diagnosis Date  . Asthma   . CAD (coronary artery disease)    s/p CABG  . Degenerative disk disease    cervical/spinal stenosis  . HTN (hypertension)   . Hyperlipidemia     ALLERGIES: No Known Allergies    MEDICATIONS:  Current Outpatient Medications on File Prior to Visit  Medication Sig Dispense Refill  . aspirin 81 MG tablet Take 81 mg by mouth daily.    . budesonide-formoterol (SYMBICORT) 160-4.5 MCG/ACT inhaler Inhale 2 puffs into the lungs 2 (two) times daily.    . cyanocobalamin 1000 MCG tablet Take 1,000 mcg by mouth daily.     Marland Kitchen lovastatin (MEVACOR) 40 MG tablet Take 2 tablets once a day    . metoprolol succinate (TOPROL-XL) 50 MG 24 hr tablet Take 50 mg by mouth daily. Patient taking 1/2 tab in the morning and 1/2 tab in the evening. Total 50 mg per day. Take with or immediately following a meal.    . olmesartan (BENICAR) 20 MG tablet Take 20 mg by mouth daily.     No current facility-administered medications on file prior to visit.      PAST SURGICAL HISTORY Past Surgical History:  Procedure Laterality Date  . APPENDECTOMY    . BACK SURGERY    . bilateral inguinal hernia repair    . CORONARY ARTERY BYPASS GRAFT  2001   after positive exercise treadmill test.  LIMA-LAD, Radial artery to LCx marginal, SVG to second diagonal, SVG to PDA  . TONSILLECTOMY AND ADENOIDECTOMY      FAMILY HISTORY: Family History  Problem Relation Age of Onset  . Heart attack Father 61       died  . Heart attack Brother     SOCIAL HISTORY:  reports that he has quit smoking. He has quit using smokeless tobacco. He reports that he does not drink alcohol or use drugs.  PERFORMANCE STATUS: The patient's performance status is 1 - Symptomatic but completely ambulatory  PHYSICAL EXAM: Most Recent Vital Signs: There were no vitals taken  for this visit. BP (!) 131/59 (BP Location: Left Arm, Patient Position: Sitting)   Pulse 70   Temp 97.8 F (36.6 C) (Temporal)   Resp 18   Ht 5' 9.5" (1.765 m)   Wt 138 lb (62.6 kg)   SpO2 98%   BMI 20.09 kg/m   General Appearance:    Alert, cooperative, no distress, appears stated age  Head:    Normocephalic, without obvious abnormality, atraumatic  Eyes:    PERRL, conjunctiva/corneas clear, EOM's intact, fundi    benign, both eyes        Throat:   Lips, mucosa, and tongue normal; teeth and gums normal  Neck:   Supple, symmetrical, trachea midline, no adenopathy;    thyroid:  no enlargement/tenderness/nodules; no carotid   bruit or JVD  Back:     Symmetric, no curvature, ROM  normal, no CVA tenderness  Lungs:     Clear to auscultation bilaterally, respirations unlabored  Chest Wall:    No tenderness or deformity   Heart:    Regular rate and rhythm, S1 and S2 normal, no murmur, rub   or gallop     Abdomen:     Soft, non-tender, bowel sounds active all four quadrants,    no masses, no organomegaly        Extremities:   Extremities normal, atraumatic, no cyanosis or edema  Pulses:   2+ and symmetric all extremities  Skin:   Skin color, texture, turgor normal, no rashes or lesions  Lymph nodes:   Cervical, supraclavicular, and axillary nodes normal  Neurologic:   CNII-XII intact, normal strength, sensation and reflexes    throughout    LABORATORY DATA:  Results for orders placed or performed in visit on 09/01/19 (from the past 48 hour(s))  CBC with Differential ([place] Only)     Status: None   Collection Time: 09/01/19 12:55 PM  Result Value Ref Range   WBC Count 8.2 4.0 - 10.5 K/uL   RBC 4.43 4.22 - 5.81 MIL/uL   Hemoglobin 14.6 13.0 - 17.0 g/dL   HCT 44.3 39.0 - 52.0 %   MCV 100.0 80.0 - 100.0 fL   MCH 33.0 26.0 - 34.0 pg   MCHC 33.0 30.0 - 36.0 g/dL   RDW 12.5 11.5 - 15.5 %   Platelet Count 215 150 - 400 K/uL   nRBC 0.0 0.0 - 0.2 %   Neutrophils Relative % 62 %   Neutro Abs 5.1 1.7 - 7.7 K/uL   Lymphocytes Relative 26 %   Lymphs Abs 2.1 0.7 - 4.0 K/uL   Monocytes Relative 9 %   Monocytes Absolute 0.7 0.1 - 1.0 K/uL   Eosinophils Relative 2 %   Eosinophils Absolute 0.2 0.0 - 0.5 K/uL   Basophils Relative 1 %   Basophils Absolute 0.1 0.0 - 0.1 K/uL   Immature Granulocytes 0 %   Abs Immature Granulocytes 0.03 0.00 - 0.07 K/uL    Comment: Performed at [facility] at [facility], [address], [place], [place] [address]  Save [facility])     Status: None   Collection Time: 09/01/19 12:55 PM  Result Value Ref Range   Smear Review SMEAR STAINED AND AVAILABLE FOR REVIEW     Comment: Performed at [facility] at [facility], [address] [place], [place] [address]      RADIOGRAPHY: No results found.     PATHOLOGY: None  ASSESSMENT/PLAN:  Shawn Bauer is a very pleasant 85 yo caucasian gentleman with recent increase in fatigue and slight serum M-spike and CKD noted on lab work with PCP.  Extensive lab work drawn today and results are pending.  We will get him set up for a bone marrow biopsy at [facility] and as well as a PET scan.  We will see him back in a month for follow-up and to go over scan and biopsy results.   All questions were answered and he is in agreement with the plan. HE will contact our office with any questions or concerns. We can certainly see him sooner if needed.   He was discussed with and also seen by Dr. Maylon Peppers and he is in agreement with the aforementioned.   Laverna Peace, NP     Addendum: I interviewed and examined the patient.  I reviewed the patient's records extensively as well as NP Elizeo Rodriques's note, and agree with the plan as detailed.   In summary, patient was seen by his PCP for progressive fatigue over the past several months, and blood work showed an incidental M spike of 0.2 g/dL.  Creatinine was elevated at 2.0.  He has had approximately 2 to 3 pound weight loss over the past several months, but denies any fever, chill, or night sweats.  He has had chronic back pain for many years due to osteoarthritis, but denies any other new onset bone pain.  He does not have history of diabetes.  Review of the patient's renal function showed Stage III CKD dating back to at least 2017.  I discussed at length with the patient and his spouse regarding the pathophysiology, diagnosis, and management of plasma cell dyscrasia.  And his recent blood work at the PCPs office only included SPEP, we have ordered SPEP, IFE, free light chains, and beta-2 microglobulin.  Given the abnormal renal function, I recommend bone marrow biopsy to rule out multiple  myeloma in the bone marrow.  In addition, I recommend PET scan to assess for any abnormal bony lesions that may be related to the plasma cell dyscrasia.  Pending the work-up above, we will determine if the patient meets criteria for multiple myeloma or require any treatment.  Furthermore, given his chronic kidney disease, I strongly recommend the patient to avoid NSAIDs, which can further exacerbate the renal dysfunction.  Thank you for the opportunity to participate in the patient's care.  Tish Men, MD 09/01/2019 4:12 PM

## 2019-09-02 LAB — PROTEIN ELECTROPHORESIS, SERUM
A/G Ratio: 1.5 (ref 0.7–1.7)
Albumin ELP: 3.5 g/dL (ref 2.9–4.4)
Alpha-1-Globulin: 0.2 g/dL (ref 0.0–0.4)
Alpha-2-Globulin: 0.6 g/dL (ref 0.4–1.0)
Beta Globulin: 0.7 g/dL (ref 0.7–1.3)
Gamma Globulin: 0.8 g/dL (ref 0.4–1.8)
Globulin, Total: 2.4 g/dL (ref 2.2–3.9)
M-Spike, %: 0.2 g/dL — ABNORMAL HIGH
Total Protein ELP: 5.9 g/dL — ABNORMAL LOW (ref 6.0–8.5)

## 2019-09-02 LAB — IGG, IGA, IGM
IgA: 205 mg/dL (ref 61–437)
IgG (Immunoglobin G), Serum: 801 mg/dL (ref 603–1613)
IgM (Immunoglobulin M), Srm: 30 mg/dL (ref 15–143)

## 2019-09-02 LAB — IMMUNOFIXATION ELECTROPHORESIS
IgA: 205 mg/dL (ref 61–437)
IgG (Immunoglobin G), Serum: 780 mg/dL (ref 603–1613)
IgM (Immunoglobulin M), Srm: 26 mg/dL (ref 15–143)
Total Protein ELP: 5.7 g/dL — ABNORMAL LOW (ref 6.0–8.5)

## 2019-09-02 LAB — KAPPA/LAMBDA LIGHT CHAINS
Kappa free light chain: 393.7 mg/L — ABNORMAL HIGH (ref 3.3–19.4)
Kappa, lambda light chain ratio: 31.75 — ABNORMAL HIGH (ref 0.26–1.65)
Lambda free light chains: 12.4 mg/L (ref 5.7–26.3)

## 2019-09-02 LAB — BETA 2 MICROGLOBULIN, SERUM: Beta-2 Microglobulin: 2.6 mg/L — ABNORMAL HIGH (ref 0.6–2.4)

## 2019-09-03 ENCOUNTER — Telehealth: Payer: Self-pay | Admitting: Hematology & Oncology

## 2019-09-03 NOTE — Telephone Encounter (Signed)
Appointments scheduled letter/calendar mailed per 12/8 los

## 2019-09-08 ENCOUNTER — Inpatient Hospital Stay (HOSPITAL_BASED_OUTPATIENT_CLINIC_OR_DEPARTMENT_OTHER): Payer: Medicare Other | Admitting: Adult Health

## 2019-09-08 ENCOUNTER — Inpatient Hospital Stay: Payer: Medicare Other

## 2019-09-08 ENCOUNTER — Other Ambulatory Visit: Payer: Self-pay

## 2019-09-08 VITALS — BP 137/73 | HR 77 | Temp 98.4°F | Resp 18

## 2019-09-08 DIAGNOSIS — C9000 Multiple myeloma not having achieved remission: Secondary | ICD-10-CM | POA: Diagnosis not present

## 2019-09-08 DIAGNOSIS — R778 Other specified abnormalities of plasma proteins: Secondary | ICD-10-CM | POA: Insufficient documentation

## 2019-09-08 LAB — CBC WITH DIFFERENTIAL (CANCER CENTER ONLY)
Abs Immature Granulocytes: 0.02 10*3/uL (ref 0.00–0.07)
Basophils Absolute: 0.1 10*3/uL (ref 0.0–0.1)
Basophils Relative: 1 %
Eosinophils Absolute: 0.1 10*3/uL (ref 0.0–0.5)
Eosinophils Relative: 2 %
HCT: 41.1 % (ref 39.0–52.0)
Hemoglobin: 13.7 g/dL (ref 13.0–17.0)
Immature Granulocytes: 0 %
Lymphocytes Relative: 25 %
Lymphs Abs: 1.7 10*3/uL (ref 0.7–4.0)
MCH: 33.3 pg (ref 26.0–34.0)
MCHC: 33.3 g/dL (ref 30.0–36.0)
MCV: 100 fL (ref 80.0–100.0)
Monocytes Absolute: 0.8 10*3/uL (ref 0.1–1.0)
Monocytes Relative: 11 %
Neutro Abs: 4.2 10*3/uL (ref 1.7–7.7)
Neutrophils Relative %: 61 %
Platelet Count: 169 10*3/uL (ref 150–400)
RBC: 4.11 MIL/uL — ABNORMAL LOW (ref 4.22–5.81)
RDW: 12.5 % (ref 11.5–15.5)
WBC Count: 7 10*3/uL (ref 4.0–10.5)
nRBC: 0 % (ref 0.0–0.2)

## 2019-09-08 MED ORDER — LORAZEPAM 1 MG PO TABS
0.5000 mg | ORAL_TABLET | Freq: Once | ORAL | Status: AC
  Administered 2019-09-08: 08:00:00 0.5 mg via ORAL

## 2019-09-08 MED ORDER — LIDOCAINE HCL 2 % IJ SOLN
INTRAMUSCULAR | Status: AC
  Filled 2019-09-08: qty 20

## 2019-09-08 MED ORDER — LORAZEPAM 1 MG PO TABS
ORAL_TABLET | ORAL | Status: AC
  Filled 2019-09-08: qty 1

## 2019-09-08 NOTE — Patient Instructions (Signed)

## 2019-09-08 NOTE — Progress Notes (Signed)
Pt BMBX site clean, dry, and intact at d/c. Pt VSS at d/c. Went over d/c instructions with patient and patient verbalized understanding.

## 2019-09-08 NOTE — Progress Notes (Signed)
INDICATION: abnormal SPEP  Brief examination was performed. ENT: adequate airway clearance Heart: regular rate and rhythm.No Murmurs Lungs: clear to auscultation, no wheezes, normal respiratory effort  Bone Marrow Biopsy and Aspiration Procedure Note   Informed consent was obtained and potential risks including bleeding, infection and pain were reviewed with the patient.  The patient's name, date of birth, identification, consent and allergies were verified prior to the start of procedure and time out was performed.  The left posterior iliac crest was chosen as the site of biopsy.  The skin was prepped with ChloraPrep.   8 cc of 2% lidocaine was used to provide local anaesthesia.   10 cc of bone marrow aspirate was obtained followed by 1cm biopsy.  Pressure was applied to the biopsy site and bandage was placed over the biopsy site. Patient was made to lie on the back for 30 mins prior to discharge.  The procedure was tolerated well. COMPLICATIONS: None BLOOD LOSS: none The patient was discharged home in stable condition with a 2 week follow up to review results.  Patient was provided with post bone marrow biopsy instructions and instructed to call if there was any bleeding or worsening pain.  Specimens sent for flow cytometry, cytogenetics and additional studies.  Signed Scot Dock, NP

## 2019-09-10 LAB — SURGICAL PATHOLOGY

## 2019-09-11 ENCOUNTER — Ambulatory Visit (HOSPITAL_COMMUNITY)
Admission: RE | Admit: 2019-09-11 | Discharge: 2019-09-11 | Disposition: A | Discharge status: Home / Self Care | Payer: Medicare Other | Source: Ambulatory Visit | Attending: Family | Admitting: Family

## 2019-09-11 ENCOUNTER — Other Ambulatory Visit: Payer: Self-pay

## 2019-09-11 DIAGNOSIS — C9000 Multiple myeloma not having achieved remission: Secondary | ICD-10-CM

## 2019-09-11 DIAGNOSIS — I7090 Unspecified atherosclerosis: Secondary | ICD-10-CM | POA: Diagnosis not present

## 2019-09-11 DIAGNOSIS — R5383 Other fatigue: Secondary | ICD-10-CM | POA: Insufficient documentation

## 2019-09-11 DIAGNOSIS — R778 Other specified abnormalities of plasma proteins: Secondary | ICD-10-CM | POA: Diagnosis not present

## 2019-09-11 LAB — GLUCOSE, CAPILLARY: Glucose-Capillary: 102 mg/dL — ABNORMAL HIGH (ref 70–99)

## 2019-09-11 MED ORDER — FLUDEOXYGLUCOSE F - 18 (FDG) INJECTION
6.6900 | Freq: Once | INTRAVENOUS | Status: AC | PRN
  Administered 2019-09-11: 6.69 via INTRAVENOUS

## 2019-09-16 ENCOUNTER — Encounter (HOSPITAL_COMMUNITY): Payer: Self-pay | Admitting: Hematology & Oncology

## 2019-09-21 ENCOUNTER — Encounter (HOSPITAL_COMMUNITY): Payer: Self-pay | Admitting: Hematology & Oncology

## 2019-09-30 ENCOUNTER — Encounter: Payer: Self-pay | Admitting: Hematology & Oncology

## 2019-09-30 ENCOUNTER — Telehealth: Payer: Self-pay | Admitting: Hematology & Oncology

## 2019-09-30 ENCOUNTER — Other Ambulatory Visit: Payer: Self-pay

## 2019-09-30 ENCOUNTER — Inpatient Hospital Stay (HOSPITAL_BASED_OUTPATIENT_CLINIC_OR_DEPARTMENT_OTHER): Payer: Medicare Other | Admitting: Hematology & Oncology

## 2019-09-30 ENCOUNTER — Inpatient Hospital Stay: Payer: Medicare Other | Attending: Family

## 2019-09-30 DIAGNOSIS — C9000 Multiple myeloma not having achieved remission: Secondary | ICD-10-CM

## 2019-09-30 DIAGNOSIS — Z79899 Other long term (current) drug therapy: Secondary | ICD-10-CM | POA: Diagnosis not present

## 2019-09-30 DIAGNOSIS — Z7982 Long term (current) use of aspirin: Secondary | ICD-10-CM | POA: Diagnosis not present

## 2019-09-30 DIAGNOSIS — Z7189 Other specified counseling: Secondary | ICD-10-CM | POA: Diagnosis not present

## 2019-09-30 DIAGNOSIS — N289 Disorder of kidney and ureter, unspecified: Secondary | ICD-10-CM | POA: Insufficient documentation

## 2019-09-30 DIAGNOSIS — Z5111 Encounter for antineoplastic chemotherapy: Secondary | ICD-10-CM | POA: Diagnosis not present

## 2019-09-30 DIAGNOSIS — R778 Other specified abnormalities of plasma proteins: Secondary | ICD-10-CM

## 2019-09-30 HISTORY — DX: Other specified counseling: Z71.89

## 2019-09-30 HISTORY — DX: Multiple myeloma not having achieved remission: C90.00

## 2019-09-30 LAB — CBC WITH DIFFERENTIAL (CANCER CENTER ONLY)
Abs Immature Granulocytes: 0.04 10*3/uL (ref 0.00–0.07)
Basophils Absolute: 0.1 10*3/uL (ref 0.0–0.1)
Basophils Relative: 1 %
Eosinophils Absolute: 0.2 10*3/uL (ref 0.0–0.5)
Eosinophils Relative: 2 %
HCT: 47.8 % (ref 39.0–52.0)
Hemoglobin: 15.4 g/dL (ref 13.0–17.0)
Immature Granulocytes: 0 %
Lymphocytes Relative: 23 %
Lymphs Abs: 2.1 10*3/uL (ref 0.7–4.0)
MCH: 32.2 pg (ref 26.0–34.0)
MCHC: 32.2 g/dL (ref 30.0–36.0)
MCV: 100 fL (ref 80.0–100.0)
Monocytes Absolute: 0.8 10*3/uL (ref 0.1–1.0)
Monocytes Relative: 9 %
Neutro Abs: 6 10*3/uL (ref 1.7–7.7)
Neutrophils Relative %: 65 %
Platelet Count: 231 10*3/uL (ref 150–400)
RBC: 4.78 MIL/uL (ref 4.22–5.81)
RDW: 12.6 % (ref 11.5–15.5)
WBC Count: 9.2 10*3/uL (ref 4.0–10.5)
nRBC: 0 % (ref 0.0–0.2)

## 2019-09-30 LAB — CMP (CANCER CENTER ONLY)
ALT: 10 U/L (ref 0–44)
AST: 11 U/L — ABNORMAL LOW (ref 15–41)
Albumin: 4.1 g/dL (ref 3.5–5.0)
Alkaline Phosphatase: 60 U/L (ref 38–126)
Anion gap: 6 (ref 5–15)
BUN: 22 mg/dL (ref 8–23)
CO2: 29 mmol/L (ref 22–32)
Calcium: 9.8 mg/dL (ref 8.9–10.3)
Chloride: 106 mmol/L (ref 98–111)
Creatinine: 1.87 mg/dL — ABNORMAL HIGH (ref 0.61–1.24)
GFR, Est AFR Am: 38 mL/min — ABNORMAL LOW (ref >60)
GFR, Estimated: 33 mL/min — ABNORMAL LOW (ref >60)
Glucose, Bld: 124 mg/dL — ABNORMAL HIGH (ref 70–99)
Potassium: 4.5 mmol/L (ref 3.5–5.1)
Sodium: 141 mmol/L (ref 135–145)
Total Bilirubin: 0.6 mg/dL (ref 0.3–1.2)
Total Protein: 6.4 g/dL — ABNORMAL LOW (ref 6.5–8.1)

## 2019-09-30 NOTE — Progress Notes (Signed)
Hematology and Oncology Follow Up Visit  Shawn [facility] [id number] 09-Apr-1934 86 y.o. 09/30/2019   Principle Diagnosis:   IgG Kappa myeloma -- high risk due to t(4:21)  Current Therapy:    Velcade/Decadron -- cycle #1 to start 10/09/2019     Interim History:  Shawn Bauer is back for second office visit.  We did go ahead and get a bone marrow biopsy on him.  This was done on 09/09/2019.  The pathology report [facility] - [id number]) showed 9% plasma cells.  They were IgG kappa restricted.  We did do cytogenetics.  He does have a translocation- t(4:21) -that I would consider as a elevated risk for him.  We did do a PET scan on him.  There is no obvious bony lesions on the PET scan.  His labs show that he does have quite significant kappa light chain level in his blood of 39.3 mg/dL.  His monoclonal spike back in December was only 0.2 g/dL.  His IgG level was 800 mg/dL.  I just feel that given the translocation that we see, and the fact that he has a relatively high kappa light chain level, that we probably should consider him for some form of therapy.  He comes in with his wife.  I went over all the lab results with he and his wife.  I explained my concerns.  Think he would be a good candidate for Velcade/Decadron.  I believe that single agent Velcade would be reasonable.  We could certainly add Revlimid if necessary.  He has had no problems with fever.  He has had no nausea or vomiting.  He has had no bleeding.  He has had no headache.  He has had no cough or shortness of breath.  Overall, his performance status is ECOG 1.  Medications:  Current Outpatient Medications:  .  aspirin 81 MG tablet, Take 81 mg by mouth daily., Disp: , Rfl:  .  budesonide-formoterol (SYMBICORT) 160-4.5 MCG/ACT inhaler, Inhale 2 puffs into the lungs 2 (two) times daily., Disp: , Rfl:  .  cyanocobalamin 1000 MCG tablet, Take 1,000 mcg by mouth daily., Disp: , Rfl:  .  lovastatin (MEVACOR) 40 MG tablet, Take 2  tablets once a day, Disp: , Rfl:  .  metoprolol succinate (TOPROL-XL) 50 MG 24 hr tablet, Take 50 mg by mouth daily. Patient taking 1/2 tab in the morning and 1/2 tab in the evening. Total 50 mg per day. Take with or immediately following a meal., Disp: , Rfl:  .  olmesartan (BENICAR) 20 MG tablet, Take 20 mg by mouth daily., Disp: , Rfl:  .  traMADol (ULTRAM) 50 MG tablet, Take 50 mg by mouth 2 (two) times daily as needed., Disp: , Rfl:   Allergies: No Known Allergies  Past Medical History, Surgical history, Social history, and Family History were reviewed and updated.  Review of Systems: Review of Systems  Constitutional: Negative.   HENT:  Negative.   Eyes: Negative.   Respiratory: Negative.   Cardiovascular: Negative.   Gastrointestinal: Negative.   Endocrine: Negative.   Genitourinary: Negative.    Musculoskeletal: Negative.   Skin: Negative.   Neurological: Negative.   Hematological: Negative.   Psychiatric/Behavioral: Negative.     Physical Exam:  weight is 140 lb (63.5 kg). His temporal temperature is 97.5 F (36.4 C) (abnormal). His blood pressure is 130/81 and his pulse is 80. His respiration is 16 and oxygen saturation is 98%.   Wt Readings from Last 3 Encounters:  09/30/19 140 lb (63.5 kg)  09/01/19 138 lb (62.6 kg)  10/30/18 143 lb 1.9 oz (64.9 kg)    Physical Exam Vitals reviewed.  HENT:     Head: Normocephalic and atraumatic.  Eyes:     Pupils: Pupils are equal, round, and reactive to light.  Cardiovascular:     Rate and Rhythm: Normal rate and regular rhythm.     Heart sounds: Normal heart sounds.  Pulmonary:     Effort: Pulmonary effort is normal.     Breath sounds: Normal breath sounds.  Abdominal:     General: Bowel sounds are normal.     Palpations: Abdomen is soft.  Musculoskeletal:        General: No tenderness or deformity. Normal range of motion.     Cervical back: Normal range of motion.  Lymphadenopathy:     Cervical: No cervical  adenopathy.  Skin:    General: Skin is warm and dry.     Findings: No erythema or rash.  Neurological:     Mental Status: He is alert and oriented to person, place, and time.  Psychiatric:        Behavior: Behavior normal.        Thought Content: Thought content normal.        Judgment: Judgment normal.      Lab Results  Component Value Date   WBC 9.2 09/30/2019   HGB 15.4 09/30/2019   HCT 47.8 09/30/2019   MCV 100.0 09/30/2019   PLT 231 09/30/2019     Chemistry      Component Value Date/Time   NA 141 09/30/2019 1124   K 4.5 09/30/2019 1124   CL 106 09/30/2019 1124   CO2 29 09/30/2019 1124   BUN 22 09/30/2019 1124   CREATININE 1.87 (H) 09/30/2019 1124      Component Value Date/Time   CALCIUM 9.8 09/30/2019 1124   ALKPHOS 60 09/30/2019 1124   AST 11 (L) 09/30/2019 1124   ALT 10 09/30/2019 1124   BILITOT 0.6 09/30/2019 1124       Impression and Plan: Shawn Bauer is an 86 year old white male.  He has what looks like early myeloma.  Again, I would consider this at a higher risk given the translocation.  It is hard to say whether his renal insufficiency is from the light chain disease.  I do think that he is in good shape that we really need to consider him for therapy.  I do think that Velcade/Decadron would be very reasonable.  I spent about 45 minutes with he and his wife.  All time spent face-to-face.  I went over his lab work.  I explained the Velcade side effects.  I answered all the questions.  I coordinated his follow-up visits and his treatment schedule.  We will start treatment on January 15.  I will plan to see him back in February for the start of his second cycle of treatment.  I did do a 24-hour urine on him today.  We will see what the results show.   [name], MD 1/6/20215:12 PM

## 2019-09-30 NOTE — Telephone Encounter (Signed)
Appointments scheduled/ calendar printed/24 urine jug provided as well w/ instructions by Ophelia Shoulder, Phleb. Per 1/6 los

## 2019-09-30 NOTE — Progress Notes (Signed)
START ON PATHWAY REGIMEN - Multiple Myeloma and Other Plasma Cell Dyscrasias     A cycle is every 21 days:     Bortezomib      Lenalidomide      Dexamethasone   **Always confirm dose/schedule in your pharmacy ordering system**  Patient Characteristics: Newly Diagnosed, Transplant Ineligible or Refused, High Risk R-ISS Staging: III Disease Classification: Newly Diagnosed Is Patient Eligible for Transplant<= Transplant Ineligible or Refused Risk Status: High Risk Intent of Therapy: Non-Curative / Palliative Intent, Discussed with Patient

## 2019-10-08 ENCOUNTER — Other Ambulatory Visit: Payer: Self-pay | Admitting: *Deleted

## 2019-10-08 DIAGNOSIS — C9000 Multiple myeloma not having achieved remission: Secondary | ICD-10-CM

## 2019-10-09 ENCOUNTER — Inpatient Hospital Stay: Payer: Medicare Other

## 2019-10-09 DIAGNOSIS — Z5111 Encounter for antineoplastic chemotherapy: Secondary | ICD-10-CM | POA: Diagnosis not present

## 2019-10-12 ENCOUNTER — Encounter (INDEPENDENT_AMBULATORY_CARE_PROVIDER_SITE_OTHER): Payer: Self-pay

## 2019-10-12 ENCOUNTER — Other Ambulatory Visit: Payer: Self-pay

## 2019-10-12 ENCOUNTER — Inpatient Hospital Stay: Payer: Medicare Other

## 2019-10-12 VITALS — BP 134/54 | HR 69 | Temp 97.3°F | Resp 17

## 2019-10-12 DIAGNOSIS — C9000 Multiple myeloma not having achieved remission: Secondary | ICD-10-CM

## 2019-10-12 DIAGNOSIS — Z5111 Encounter for antineoplastic chemotherapy: Secondary | ICD-10-CM | POA: Diagnosis not present

## 2019-10-12 LAB — UPEP/UIFE/LIGHT CHAINS/TP, 24-HR UR
% BETA, Urine: 27.4 %
ALPHA 1 URINE: 2.6 %
Albumin, U: 11.8 %
Alpha 2, Urine: 15.2 %
Free Kappa Lt Chains,Ur: 2158.72 mg/L — ABNORMAL HIGH (ref 0.63–113.79)
Free Kappa/Lambda Ratio: 109.25 — ABNORMAL HIGH (ref 1.03–31.76)
Free Lambda Lt Chains,Ur: 19.76 mg/L — ABNORMAL HIGH (ref 0.47–11.77)
GAMMA GLOBULIN URINE: 43 %
M-SPIKE %, Urine: 32 % — ABNORMAL HIGH
M-Spike, Mg/24 Hr: 154 mg/24 hr — ABNORMAL HIGH
Total Protein, Urine-Ur/day: 480 mg/24 hr — ABNORMAL HIGH (ref 30–150)
Total Protein, Urine: 45.7 mg/dL
Total Volume: 1050

## 2019-10-12 LAB — CMP (CANCER CENTER ONLY)
ALT: 11 U/L (ref 0–44)
AST: 11 U/L — ABNORMAL LOW (ref 15–41)
Albumin: 4 g/dL (ref 3.5–5.0)
Alkaline Phosphatase: 56 U/L (ref 38–126)
Anion gap: 4 — ABNORMAL LOW (ref 5–15)
BUN: 20 mg/dL (ref 8–23)
CO2: 31 mmol/L (ref 22–32)
Calcium: 9.7 mg/dL (ref 8.9–10.3)
Chloride: 107 mmol/L (ref 98–111)
Creatinine: 1.92 mg/dL — ABNORMAL HIGH (ref 0.61–1.24)
GFR, Est AFR Am: 36 mL/min — ABNORMAL LOW (ref >60)
GFR, Estimated: 31 mL/min — ABNORMAL LOW (ref >60)
Glucose, Bld: 131 mg/dL — ABNORMAL HIGH (ref 70–99)
Potassium: 5.5 mmol/L — ABNORMAL HIGH (ref 3.5–5.1)
Sodium: 142 mmol/L (ref 135–145)
Total Bilirubin: 0.7 mg/dL (ref 0.3–1.2)
Total Protein: 5.8 g/dL — ABNORMAL LOW (ref 6.5–8.1)

## 2019-10-12 LAB — CBC WITH DIFFERENTIAL (CANCER CENTER ONLY)
Abs Immature Granulocytes: 0.02 10*3/uL (ref 0.00–0.07)
Basophils Absolute: 0.1 10*3/uL (ref 0.0–0.1)
Basophils Relative: 1 %
Eosinophils Absolute: 0.2 10*3/uL (ref 0.0–0.5)
Eosinophils Relative: 3 %
HCT: 43.3 % (ref 39.0–52.0)
Hemoglobin: 14.2 g/dL (ref 13.0–17.0)
Immature Granulocytes: 0 %
Lymphocytes Relative: 25 %
Lymphs Abs: 2.2 10*3/uL (ref 0.7–4.0)
MCH: 32.6 pg (ref 26.0–34.0)
MCHC: 32.8 g/dL (ref 30.0–36.0)
MCV: 99.5 fL (ref 80.0–100.0)
Monocytes Absolute: 0.7 10*3/uL (ref 0.1–1.0)
Monocytes Relative: 8 %
Neutro Abs: 5.5 10*3/uL (ref 1.7–7.7)
Neutrophils Relative %: 63 %
Platelet Count: 207 10*3/uL (ref 150–400)
RBC: 4.35 MIL/uL (ref 4.22–5.81)
RDW: 12.4 % (ref 11.5–15.5)
WBC Count: 8.7 10*3/uL (ref 4.0–10.5)
nRBC: 0 % (ref 0.0–0.2)

## 2019-10-12 MED ORDER — PROCHLORPERAZINE MALEATE 10 MG PO TABS
10.0000 mg | ORAL_TABLET | Freq: Once | ORAL | Status: AC
  Administered 2019-10-12: 10 mg via ORAL

## 2019-10-12 MED ORDER — DENOSUMAB 120 MG/1.7ML ~~LOC~~ SOLN
120.0000 mg | Freq: Once | SUBCUTANEOUS | Status: AC
  Administered 2019-10-12: 120 mg via SUBCUTANEOUS

## 2019-10-12 MED ORDER — DEXAMETHASONE 4 MG PO TABS
20.0000 mg | ORAL_TABLET | ORAL | Status: DC
  Administered 2019-10-12: 20 mg via ORAL

## 2019-10-12 MED ORDER — DEXAMETHASONE 4 MG PO TABS
ORAL_TABLET | ORAL | Status: AC
  Filled 2019-10-12: qty 5

## 2019-10-12 MED ORDER — PROCHLORPERAZINE MALEATE 10 MG PO TABS
ORAL_TABLET | ORAL | Status: AC
  Filled 2019-10-12: qty 1

## 2019-10-12 MED ORDER — DENOSUMAB 120 MG/1.7ML ~~LOC~~ SOLN
SUBCUTANEOUS | Status: AC
  Filled 2019-10-12: qty 1.7

## 2019-10-12 MED ORDER — BORTEZOMIB CHEMO SQ INJECTION 3.5 MG (2.5MG/ML)
1.3000 mg/m2 | Freq: Once | INTRAMUSCULAR | Status: AC
  Administered 2019-10-12: 2.25 mg via SUBCUTANEOUS
  Filled 2019-10-12: qty 0.9

## 2019-10-12 NOTE — Patient Instructions (Signed)
Bortezomib injection What is this medicine? BORTEZOMIB (bor TEZ oh mib) is a medicine that targets proteins in cancer cells and stops the cancer cells from growing. It is used to treat multiple myeloma and mantle-cell lymphoma. This medicine may be used for other purposes; ask your health care provider or pharmacist if you have questions. COMMON BRAND NAME(S): Velcade What should I tell my health care provider before I take this medicine? They need to know if you have any of these conditions:  diabetes  heart disease  irregular heartbeat  liver disease  on hemodialysis  low blood counts, like low white blood cells, platelets, or hemoglobin  peripheral neuropathy  taking medicine for blood pressure  an unusual or allergic reaction to bortezomib, mannitol, boron, other medicines, foods, dyes, or preservatives  pregnant or trying to get pregnant  breast-feeding How should I use this medicine? This medicine is for injection into a vein or for injection under the skin. It is given by a health care professional in a hospital or clinic setting. Talk to your pediatrician regarding the use of this medicine in children. Special care may be needed. Overdosage: If you think you have taken too much of this medicine contact a poison control center or emergency room at once. NOTE: This medicine is only for you. Do not share this medicine with others. What if I miss a dose? It is important not to miss your dose. Call your doctor or health care professional if you are unable to keep an appointment. What may interact with this medicine? This medicine may interact with the following medications:  ketoconazole  rifampin  ritonavir  St. John's Wort This list may not describe all possible interactions. Give your health care provider a list of all the medicines, herbs, non-prescription drugs, or dietary supplements you use. Also tell them if you smoke, drink alcohol, or use illegal drugs. Some  items may interact with your medicine. What should I watch for while using this medicine? You may get drowsy or dizzy. Do not drive, use machinery, or do anything that needs mental alertness until you know how this medicine affects you. Do not stand or sit up quickly, especially if you are an older patient. This reduces the risk of dizzy or fainting spells. In some cases, you may be given additional medicines to help with side effects. Follow all directions for their use. Call your doctor or health care professional for advice if you get a fever, chills or sore throat, or other symptoms of a cold or flu. Do not treat yourself. This drug decreases your body's ability to fight infections. Try to avoid being around people who are sick. This medicine may increase your risk to bruise or bleed. Call your doctor or health care professional if you notice any unusual bleeding. You may need blood work done while you are taking this medicine. In some patients, this medicine may cause a serious brain infection that may cause death. If you have any problems seeing, thinking, speaking, walking, or standing, tell your doctor right away. If you cannot reach your doctor, urgently seek other source of medical care. Check with your doctor or health care professional if you get an attack of severe diarrhea, nausea and vomiting, or if you sweat a lot. The loss of too much body fluid can make it dangerous for you to take this medicine. Do not become pregnant while taking this medicine or for at least 7 months after stopping it. Women should inform their doctor   if they wish to become pregnant or think they might be pregnant. Men should not father a child while taking this medicine and for at least 4 months after stopping it. There is a potential for serious side effects to an unborn child. Talk to your health care professional or pharmacist for more information. Do not breast-feed an infant while taking this medicine or for 2  months after stopping it. This medicine may interfere with the ability to have a child. You should talk with your doctor or health care professional if you are concerned about your fertility. What side effects may I notice from receiving this medicine? Side effects that you should report to your doctor or health care professional as soon as possible:  allergic reactions like skin rash, itching or hives, swelling of the face, lips, or tongue  breathing problems  changes in hearing  changes in vision  fast, irregular heartbeat  feeling faint or lightheaded, falls  pain, tingling, numbness in the hands or feet  right upper belly pain  seizures  swelling of the ankles, feet, hands  unusual bleeding or bruising  unusually weak or tired  vomiting  yellowing of the eyes or skin Side effects that usually do not require medical attention (report to your doctor or health care professional if they continue or are bothersome):  changes in emotions or moods  constipation  diarrhea  loss of appetite  headache  irritation at site where injected  nausea This list may not describe all possible side effects. Call your doctor for medical advice about side effects. You may report side effects to FDA at 1-800-FDA-1088. Where should I keep my medicine? This drug is given in a hospital or clinic and will not be stored at home. NOTE: This sheet is a summary. It may not cover all possible information. If you have questions about this medicine, talk to your doctor, pharmacist, or health care provider.  2020 Elsevier/Gold Standard (2018-01-20 16:29:31)  Denosumab injection What is this medicine? DENOSUMAB (den oh sue mab) slows bone breakdown. Prolia is used to treat osteoporosis in women after menopause and in men, and in people who are taking corticosteroids for 6 months or more. Xgeva is used to treat a high calcium level due to cancer and to prevent bone fractures and other bone  problems caused by multiple myeloma or cancer bone metastases. Xgeva is also used to treat giant cell tumor of the bone. This medicine may be used for other purposes; ask your health care provider or pharmacist if you have questions. COMMON BRAND NAME(S): Prolia, XGEVA What should I tell my health care provider before I take this medicine? They need to know if you have any of these conditions:  dental disease  having surgery or tooth extraction  infection  kidney disease  low levels of calcium or Vitamin D in the blood  malnutrition  on hemodialysis  skin conditions or sensitivity  thyroid or parathyroid disease  an unusual reaction to denosumab, other medicines, foods, dyes, or preservatives  pregnant or trying to get pregnant  breast-feeding How should I use this medicine? This medicine is for injection under the skin. It is given by a health care professional in a hospital or clinic setting. A special MedGuide will be given to you before each treatment. Be sure to read this information carefully each time. For Prolia, talk to your pediatrician regarding the use of this medicine in children. Special care may be needed. For Xgeva, talk to your pediatrician   regarding the use of this medicine in children. While this drug may be prescribed for children as young as 13 years for selected conditions, precautions do apply. Overdosage: If you think you have taken too much of this medicine contact a poison control center or emergency room at once. NOTE: This medicine is only for you. Do not share this medicine with others. What if I miss a dose? It is important not to miss your dose. Call your doctor or health care professional if you are unable to keep an appointment. What may interact with this medicine? Do not take this medicine with any of the following medications:  other medicines containing denosumab This medicine may also interact with the following medications:  medicines  that lower your chance of fighting infection  steroid medicines like prednisone or cortisone This list may not describe all possible interactions. Give your health care provider a list of all the medicines, herbs, non-prescription drugs, or dietary supplements you use. Also tell them if you smoke, drink alcohol, or use illegal drugs. Some items may interact with your medicine. What should I watch for while using this medicine? Visit your doctor or health care professional for regular checks on your progress. Your doctor or health care professional may order blood tests and other tests to see how you are doing. Call your doctor or health care professional for advice if you get a fever, chills or sore throat, or other symptoms of a cold or flu. Do not treat yourself. This drug may decrease your body's ability to fight infection. Try to avoid being around people who are sick. You should make sure you get enough calcium and vitamin D while you are taking this medicine, unless your doctor tells you not to. Discuss the foods you eat and the vitamins you take with your health care professional. See your dentist regularly. Brush and floss your teeth as directed. Before you have any dental work done, tell your dentist you are receiving this medicine. Do not become pregnant while taking this medicine or for 5 months after stopping it. Talk with your doctor or health care professional about your birth control options while taking this medicine. Women should inform their doctor if they wish to become pregnant or think they might be pregnant. There is a potential for serious side effects to an unborn child. Talk to your health care professional or pharmacist for more information. What side effects may I notice from receiving this medicine? Side effects that you should report to your doctor or health care professional as soon as possible:  allergic reactions like skin rash, itching or hives, swelling of the face,  lips, or tongue  bone pain  breathing problems  dizziness  jaw pain, especially after dental work  redness, blistering, peeling of the skin  signs and symptoms of infection like fever or chills; cough; sore throat; pain or trouble passing urine  signs of low calcium like fast heartbeat, muscle cramps or muscle pain; pain, tingling, numbness in the hands or feet; seizures  unusual bleeding or bruising  unusually weak or tired Side effects that usually do not require medical attention (report to your doctor or health care professional if they continue or are bothersome):  constipation  diarrhea  headache  joint pain  loss of appetite  muscle pain  runny nose  tiredness  upset stomach This list may not describe all possible side effects. Call your doctor for medical advice about side effects. You may report side effects to   FDA at 1-800-FDA-1088. Where should I keep my medicine? This medicine is only given in a clinic, doctor's office, or other health care setting and will not be stored at home. NOTE: This sheet is a summary. It may not cover all possible information. If you have questions about this medicine, talk to your doctor, pharmacist, or health care provider.  2020 Elsevier/Gold Standard (2018-01-17 16:10:44)   

## 2019-10-13 ENCOUNTER — Other Ambulatory Visit: Payer: Self-pay | Admitting: Hematology & Oncology

## 2019-10-15 ENCOUNTER — Other Ambulatory Visit: Payer: Self-pay | Admitting: *Deleted

## 2019-10-15 DIAGNOSIS — C9000 Multiple myeloma not having achieved remission: Secondary | ICD-10-CM

## 2019-10-16 ENCOUNTER — Other Ambulatory Visit: Payer: Self-pay

## 2019-10-16 ENCOUNTER — Other Ambulatory Visit: Payer: Self-pay | Admitting: *Deleted

## 2019-10-16 ENCOUNTER — Inpatient Hospital Stay: Payer: Medicare Other

## 2019-10-16 VITALS — BP 135/64 | HR 73 | Temp 98.4°F | Resp 19

## 2019-10-16 DIAGNOSIS — Z5111 Encounter for antineoplastic chemotherapy: Secondary | ICD-10-CM | POA: Diagnosis not present

## 2019-10-16 DIAGNOSIS — C9000 Multiple myeloma not having achieved remission: Secondary | ICD-10-CM

## 2019-10-16 LAB — CBC WITH DIFFERENTIAL (CANCER CENTER ONLY)
Abs Immature Granulocytes: 0.03 10*3/uL (ref 0.00–0.07)
Basophils Absolute: 0.1 10*3/uL (ref 0.0–0.1)
Basophils Relative: 1 %
Eosinophils Absolute: 0.2 10*3/uL (ref 0.0–0.5)
Eosinophils Relative: 2 %
HCT: 44 % (ref 39.0–52.0)
Hemoglobin: 14.3 g/dL (ref 13.0–17.0)
Immature Granulocytes: 0 %
Lymphocytes Relative: 31 %
Lymphs Abs: 2.7 10*3/uL (ref 0.7–4.0)
MCH: 32.6 pg (ref 26.0–34.0)
MCHC: 32.5 g/dL (ref 30.0–36.0)
MCV: 100.5 fL — ABNORMAL HIGH (ref 80.0–100.0)
Monocytes Absolute: 1 10*3/uL (ref 0.1–1.0)
Monocytes Relative: 11 %
Neutro Abs: 4.7 10*3/uL (ref 1.7–7.7)
Neutrophils Relative %: 55 %
Platelet Count: 196 10*3/uL (ref 150–400)
RBC: 4.38 MIL/uL (ref 4.22–5.81)
RDW: 12.5 % (ref 11.5–15.5)
WBC Count: 8.6 10*3/uL (ref 4.0–10.5)
nRBC: 0 % (ref 0.0–0.2)

## 2019-10-16 LAB — CMP (CANCER CENTER ONLY)
ALT: 10 U/L (ref 0–44)
AST: 10 U/L — ABNORMAL LOW (ref 15–41)
Albumin: 3.8 g/dL (ref 3.5–5.0)
Alkaline Phosphatase: 47 U/L (ref 38–126)
Anion gap: 4 — ABNORMAL LOW (ref 5–15)
BUN: 20 mg/dL (ref 8–23)
CO2: 28 mmol/L (ref 22–32)
Calcium: 8.7 mg/dL — ABNORMAL LOW (ref 8.9–10.3)
Chloride: 109 mmol/L (ref 98–111)
Creatinine: 1.69 mg/dL — ABNORMAL HIGH (ref 0.61–1.24)
GFR, Est AFR Am: 43 mL/min — ABNORMAL LOW (ref >60)
GFR, Estimated: 37 mL/min — ABNORMAL LOW (ref >60)
Glucose, Bld: 102 mg/dL — ABNORMAL HIGH (ref 70–99)
Potassium: 4.8 mmol/L (ref 3.5–5.1)
Sodium: 141 mmol/L (ref 135–145)
Total Bilirubin: 0.8 mg/dL (ref 0.3–1.2)
Total Protein: 6 g/dL — ABNORMAL LOW (ref 6.5–8.1)

## 2019-10-16 MED ORDER — DEXAMETHASONE 4 MG PO TABS
20.0000 mg | ORAL_TABLET | ORAL | Status: DC
  Administered 2019-10-16: 20 mg via ORAL

## 2019-10-16 MED ORDER — PROCHLORPERAZINE MALEATE 10 MG PO TABS
ORAL_TABLET | ORAL | Status: AC
  Filled 2019-10-16: qty 1

## 2019-10-16 MED ORDER — DEXAMETHASONE 4 MG PO TABS
ORAL_TABLET | ORAL | Status: AC
  Filled 2019-10-16: qty 5

## 2019-10-16 MED ORDER — BORTEZOMIB CHEMO SQ INJECTION 3.5 MG (2.5MG/ML)
1.3000 mg/m2 | Freq: Once | INTRAMUSCULAR | Status: AC
  Administered 2019-10-16: 2.25 mg via SUBCUTANEOUS
  Filled 2019-10-16: qty 0.9

## 2019-10-16 MED ORDER — PROCHLORPERAZINE MALEATE 10 MG PO TABS
10.0000 mg | ORAL_TABLET | Freq: Once | ORAL | Status: AC
  Administered 2019-10-16: 10 mg via ORAL

## 2019-10-16 MED ORDER — FAMCICLOVIR 250 MG PO TABS: 250.0000 mg | ORAL_TABLET | Freq: Every day | ORAL | 6 refills | Status: DC

## 2019-10-16 NOTE — Progress Notes (Signed)
SCr = 1.69.  Okay to receive Velcade today per Dr. Marin Olp.

## 2019-10-16 NOTE — Patient Instructions (Signed)
Bortezomib injection What is this medicine? BORTEZOMIB (bor TEZ oh mib) is a medicine that targets proteins in cancer cells and stops the cancer cells from growing. It is used to treat multiple myeloma and mantle-cell lymphoma. This medicine may be used for other purposes; ask your health care provider or pharmacist if you have questions. COMMON BRAND NAME(S): Velcade What should I tell my health care provider before I take this medicine? They need to know if you have any of these conditions:  diabetes  heart disease  irregular heartbeat  liver disease  on hemodialysis  low blood counts, like low white blood cells, platelets, or hemoglobin  peripheral neuropathy  taking medicine for blood pressure  an unusual or allergic reaction to bortezomib, mannitol, boron, other medicines, foods, dyes, or preservatives  pregnant or trying to get pregnant  breast-feeding How should I use this medicine? This medicine is for injection into a vein or for injection under the skin. It is given by a health care professional in a hospital or clinic setting. Talk to your pediatrician regarding the use of this medicine in children. Special care may be needed. Overdosage: If you think you have taken too much of this medicine contact a poison control center or emergency room at once. NOTE: This medicine is only for you. Do not share this medicine with others. What if I miss a dose? It is important not to miss your dose. Call your doctor or health care professional if you are unable to keep an appointment. What may interact with this medicine? This medicine may interact with the following medications:  ketoconazole  rifampin  ritonavir  St. John's Wort This list may not describe all possible interactions. Give your health care provider a list of all the medicines, herbs, non-prescription drugs, or dietary supplements you use. Also tell them if you smoke, drink alcohol, or use illegal drugs. Some  items may interact with your medicine. What should I watch for while using this medicine? You may get drowsy or dizzy. Do not drive, use machinery, or do anything that needs mental alertness until you know how this medicine affects you. Do not stand or sit up quickly, especially if you are an older patient. This reduces the risk of dizzy or fainting spells. In some cases, you may be given additional medicines to help with side effects. Follow all directions for their use. Call your doctor or health care professional for advice if you get a fever, chills or sore throat, or other symptoms of a cold or flu. Do not treat yourself. This drug decreases your body's ability to fight infections. Try to avoid being around people who are sick. This medicine may increase your risk to bruise or bleed. Call your doctor or health care professional if you notice any unusual bleeding. You may need blood work done while you are taking this medicine. In some patients, this medicine may cause a serious brain infection that may cause death. If you have any problems seeing, thinking, speaking, walking, or standing, tell your doctor right away. If you cannot reach your doctor, urgently seek other source of medical care. Check with your doctor or health care professional if you get an attack of severe diarrhea, nausea and vomiting, or if you sweat a lot. The loss of too much body fluid can make it dangerous for you to take this medicine. Do not become pregnant while taking this medicine or for at least 7 months after stopping it. Women should inform their doctor   if they wish to become pregnant or think they might be pregnant. Men should not father a child while taking this medicine and for at least 4 months after stopping it. There is a potential for serious side effects to an unborn child. Talk to your health care professional or pharmacist for more information. Do not breast-feed an infant while taking this medicine or for 2  months after stopping it. This medicine may interfere with the ability to have a child. You should talk with your doctor or health care professional if you are concerned about your fertility. What side effects may I notice from receiving this medicine? Side effects that you should report to your doctor or health care professional as soon as possible:  allergic reactions like skin rash, itching or hives, swelling of the face, lips, or tongue  breathing problems  changes in hearing  changes in vision  fast, irregular heartbeat  feeling faint or lightheaded, falls  pain, tingling, numbness in the hands or feet  right upper belly pain  seizures  swelling of the ankles, feet, hands  unusual bleeding or bruising  unusually weak or tired  vomiting  yellowing of the eyes or skin Side effects that usually do not require medical attention (report to your doctor or health care professional if they continue or are bothersome):  changes in emotions or moods  constipation  diarrhea  loss of appetite  headache  irritation at site where injected  nausea This list may not describe all possible side effects. Call your doctor for medical advice about side effects. You may report side effects to FDA at 1-800-FDA-1088. Where should I keep my medicine? This drug is given in a hospital or clinic and will not be stored at home. NOTE: This sheet is a summary. It may not cover all possible information. If you have questions about this medicine, talk to your doctor, pharmacist, or health care provider.  2020 Elsevier/Gold Standard (2018-01-20 16:29:31)  

## 2019-10-23 ENCOUNTER — Inpatient Hospital Stay: Payer: Medicare Other

## 2019-10-23 ENCOUNTER — Other Ambulatory Visit: Payer: Self-pay

## 2019-10-23 VITALS — BP 131/53 | HR 72 | Temp 98.4°F | Resp 18

## 2019-10-23 DIAGNOSIS — C9000 Multiple myeloma not having achieved remission: Secondary | ICD-10-CM

## 2019-10-23 DIAGNOSIS — Z5111 Encounter for antineoplastic chemotherapy: Secondary | ICD-10-CM | POA: Diagnosis not present

## 2019-10-23 LAB — CBC WITH DIFFERENTIAL (CANCER CENTER ONLY)
Abs Immature Granulocytes: 0.03 10*3/uL (ref 0.00–0.07)
Basophils Absolute: 0 10*3/uL (ref 0.0–0.1)
Basophils Relative: 0 %
Eosinophils Absolute: 0.3 10*3/uL (ref 0.0–0.5)
Eosinophils Relative: 4 %
HCT: 43.3 % (ref 39.0–52.0)
Hemoglobin: 14.2 g/dL (ref 13.0–17.0)
Immature Granulocytes: 0 %
Lymphocytes Relative: 23 %
Lymphs Abs: 1.8 10*3/uL (ref 0.7–4.0)
MCH: 32.3 pg (ref 26.0–34.0)
MCHC: 32.8 g/dL (ref 30.0–36.0)
MCV: 98.6 fL (ref 80.0–100.0)
Monocytes Absolute: 0.9 10*3/uL (ref 0.1–1.0)
Monocytes Relative: 11 %
Neutro Abs: 4.9 10*3/uL (ref 1.7–7.7)
Neutrophils Relative %: 62 %
Platelet Count: 152 10*3/uL (ref 150–400)
RBC: 4.39 MIL/uL (ref 4.22–5.81)
RDW: 12.5 % (ref 11.5–15.5)
WBC Count: 8 10*3/uL (ref 4.0–10.5)
nRBC: 0 % (ref 0.0–0.2)

## 2019-10-23 LAB — CMP (CANCER CENTER ONLY)
ALT: 12 U/L (ref 0–44)
AST: 11 U/L — ABNORMAL LOW (ref 15–41)
Albumin: 3.9 g/dL (ref 3.5–5.0)
Alkaline Phosphatase: 53 U/L (ref 38–126)
Anion gap: 7 (ref 5–15)
BUN: 15 mg/dL (ref 8–23)
CO2: 31 mmol/L (ref 22–32)
Calcium: 9.6 mg/dL (ref 8.9–10.3)
Chloride: 106 mmol/L (ref 98–111)
Creatinine: 1.64 mg/dL — ABNORMAL HIGH (ref 0.61–1.24)
GFR, Est AFR Am: 44 mL/min — ABNORMAL LOW (ref >60)
GFR, Estimated: 38 mL/min — ABNORMAL LOW (ref >60)
Glucose, Bld: 109 mg/dL — ABNORMAL HIGH (ref 70–99)
Potassium: 4.6 mmol/L (ref 3.5–5.1)
Sodium: 144 mmol/L (ref 135–145)
Total Bilirubin: 0.7 mg/dL (ref 0.3–1.2)
Total Protein: 6.2 g/dL — ABNORMAL LOW (ref 6.5–8.1)

## 2019-10-23 MED ORDER — DEXAMETHASONE 4 MG PO TABS
20.0000 mg | ORAL_TABLET | ORAL | Status: DC
  Administered 2019-10-23: 12:00:00 20 mg via ORAL

## 2019-10-23 MED ORDER — PROCHLORPERAZINE MALEATE 10 MG PO TABS
ORAL_TABLET | ORAL | Status: AC
  Filled 2019-10-23: qty 1

## 2019-10-23 MED ORDER — PROCHLORPERAZINE MALEATE 10 MG PO TABS
10.0000 mg | ORAL_TABLET | Freq: Once | ORAL | Status: AC
  Administered 2019-10-23: 12:00:00 10 mg via ORAL

## 2019-10-23 MED ORDER — BORTEZOMIB CHEMO SQ INJECTION 3.5 MG (2.5MG/ML)
1.3000 mg/m2 | Freq: Once | INTRAMUSCULAR | Status: AC
  Administered 2019-10-23: 2.25 mg via SUBCUTANEOUS
  Filled 2019-10-23: qty 0.9

## 2019-10-23 MED ORDER — DEXAMETHASONE 4 MG PO TABS
ORAL_TABLET | ORAL | Status: AC
  Filled 2019-10-23: qty 5

## 2019-10-23 NOTE — Patient Instructions (Addendum)
[facility] Discharge Instructions for Patients Receiving Chemotherapy  Today you received the following chemotherapy agents Velcade and Xgeva  To help prevent nausea and vomiting after your treatment, we encourage you to take your nausea medication as prescribed by MD.   If you develop nausea and vomiting that is not controlled by your nausea medication, call the clinic.   BELOW ARE SYMPTOMS THAT SHOULD BE REPORTED IMMEDIATELY:  *FEVER GREATER THAN 100.5 F  *CHILLS WITH OR WITHOUT FEVER  NAUSEA AND VOMITING THAT IS NOT CONTROLLED WITH YOUR NAUSEA MEDICATION  *UNUSUAL SHORTNESS OF BREATH  *UNUSUAL BRUISING OR BLEEDING  TENDERNESS    Bortezomib injection What is this medicine? BORTEZOMIB (bor TEZ oh mib) is a medicine that targets proteins in cancer cells and stops the cancer cells from growing. It is used to treat multiple myeloma and mantle-cell lymphoma. This medicine may be used for other purposes; ask your health care provider or pharmacist if you have questions. COMMON BRAND NAME(S): Velcade What should I tell my health care provider before I take this medicine? They need to know if you have any of these conditions: diabetes heart disease irregular heartbeat liver disease on hemodialysis low blood counts, like low white blood cells, platelets, or hemoglobin peripheral neuropathy taking medicine for blood pressure an unusual or allergic reaction to bortezomib, mannitol, boron, other medicines, foods, dyes, or preservatives pregnant or trying to get pregnant breast-feeding How should I use this medicine? This medicine is for injection into a vein or for injection under the skin. It is given by a health care professional in a hospital or clinic setting. Talk to your pediatrician regarding the use of this medicine in children. Special care may be needed. Overdosage: If you think you have taken too much of this medicine contact a poison control center or  emergency room at once. NOTE: This medicine is only for you. Do not share this medicine with others. What if I miss a dose? It is important not to miss your dose. Call your doctor or health care professional if you are unable to keep an appointment. What may interact with this medicine? This medicine may interact with the following medications: ketoconazole rifampin ritonavir St. John's Wort This list may not describe all possible interactions. Give your health care provider a list of all the medicines, herbs, non-prescription drugs, or dietary supplements you use. Also tell them if you smoke, drink alcohol, or use illegal drugs. Some items may interact with your medicine. What should I watch for while using this medicine? You may get drowsy or dizzy. Do not drive, use machinery, or do anything that needs mental alertness until you know how this medicine affects you. Do not stand or sit up quickly, especially if you are an older patient. This reduces the risk of dizzy or fainting spells. In some cases, you may be given additional medicines to help with side effects. Follow all directions for their use. Call your doctor or health care professional for advice if you get a fever, chills or sore throat, or other symptoms of a cold or flu. Do not treat yourself. This drug decreases your body's ability to fight infections. Try to avoid being around people who are sick. This medicine may increase your risk to bruise or bleed. Call your doctor or health care professional if you notice any unusual bleeding. You may need blood work done while you are taking this medicine. In some patients, this medicine may cause a serious brain infection that  may cause death. If you have any problems seeing, thinking, speaking, walking, or standing, tell your doctor right away. If you cannot reach your doctor, urgently seek other source of medical care. Check with your doctor or health care professional if you get an attack  of severe diarrhea, nausea and vomiting, or if you sweat a lot. The loss of too much body fluid can make it dangerous for you to take this medicine. Do not become pregnant while taking this medicine or for at least 7 months after stopping it. Women should inform their doctor if they wish to become pregnant or think they might be pregnant. Men should not father a child while taking this medicine and for at least 4 months after stopping it. There is a potential for serious side effects to an unborn child. Talk to your health care professional or pharmacist for more information. Do not breast-feed an infant while taking this medicine or for 2 months after stopping it. This medicine may interfere with the ability to have a child. You should talk with your doctor or health care professional if you are concerned about your fertility. What side effects may I notice from receiving this medicine? Side effects that you should report to your doctor or health care professional as soon as possible: allergic reactions like skin rash, itching or hives, swelling of the face, lips, or tongue breathing problems changes in hearing changes in vision fast, irregular heartbeat feeling faint or lightheaded, falls pain, tingling, numbness in the hands or feet right upper belly pain seizures swelling of the ankles, feet, hands unusual bleeding or bruising unusually weak or tired vomiting yellowing of the eyes or skin Side effects that usually do not require medical attention (report to your doctor or health care professional if they continue or are bothersome): changes in emotions or moods constipation diarrhea loss of appetite headache irritation at site where injected nausea This list may not describe all possible side effects. Call your doctor for medical advice about side effects. You may report side effects to FDA at 1-800-FDA-1088. Where should I keep my medicine? This drug is given in a hospital or clinic  and will not be stored at home. NOTE: This sheet is a summary. It may not cover all possible information. If you have questions about this medicine, talk to your doctor, pharmacist, or health care provider.  2020 Elsevier/Gold Standard (2018-01-20 16:29:31)  IN MOUTH AND THROAT WITH OR WITHOUT PRESENCE OF ULCERS  *URINARY PROBLEMS  *BOWEL PROBLEMS  UNUSUAL RASH Items with * indicate a potential emergency and should be followed up as soon as possible.  Feel free to call the clinic should you have any questions or concerns. The clinic phone number is [phone].  Please show the [facility] at check-in to the Emergency Department and triage nurse.

## 2019-10-23 NOTE — Progress Notes (Signed)
Per dr Marin Olp, okay to treat with elevated creatinine

## 2019-10-24 LAB — IGG, IGA, IGM
IgA: 135 mg/dL (ref 61–437)
IgG (Immunoglobin G), Serum: 661 mg/dL (ref 603–1613)
IgM (Immunoglobulin M), Srm: 25 mg/dL (ref 15–143)

## 2019-10-26 LAB — PROTEIN ELECTROPHORESIS, SERUM, WITH REFLEX
A/G Ratio: 1.4 (ref 0.7–1.7)
Albumin ELP: 3.3 g/dL (ref 2.9–4.4)
Alpha-1-Globulin: 0.2 g/dL (ref 0.0–0.4)
Alpha-2-Globulin: 0.7 g/dL (ref 0.4–1.0)
Beta Globulin: 0.8 g/dL (ref 0.7–1.3)
Gamma Globulin: 0.7 g/dL (ref 0.4–1.8)
Globulin, Total: 2.4 g/dL (ref 2.2–3.9)
Total Protein ELP: 5.7 g/dL — ABNORMAL LOW (ref 6.0–8.5)

## 2019-10-26 LAB — KAPPA/LAMBDA LIGHT CHAINS
Kappa free light chain: 252.6 mg/L — ABNORMAL HIGH (ref 3.3–19.4)
Kappa, lambda light chain ratio: 24.52 — ABNORMAL HIGH (ref 0.26–1.65)
Lambda free light chains: 10.3 mg/L (ref 5.7–26.3)

## 2019-10-27 ENCOUNTER — Telehealth: Payer: Self-pay

## 2019-10-27 NOTE — Telephone Encounter (Addendum)
Attached message given to pt via phone who verbalizes understanding. dph  ----- Message from [name], MD sent at 10/26/2019  4:44 PM EST ----- Call - the light chain level is down by over 120 points!!  This is very good news!!  Laurey Arrow
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
## 2019-11-03 ENCOUNTER — Other Ambulatory Visit: Payer: Self-pay

## 2019-11-03 ENCOUNTER — Encounter: Payer: Self-pay | Admitting: Physician Assistant

## 2019-11-03 ENCOUNTER — Ambulatory Visit (INDEPENDENT_AMBULATORY_CARE_PROVIDER_SITE_OTHER): Payer: Medicare Other | Admitting: Physician Assistant

## 2019-11-03 VITALS — BP 104/50 | HR 85 | Ht 69.5 in | Wt 141.8 lb

## 2019-11-03 DIAGNOSIS — I10 Essential (primary) hypertension: Secondary | ICD-10-CM

## 2019-11-03 DIAGNOSIS — I25119 Atherosclerotic heart disease of native coronary artery with unspecified angina pectoris: Secondary | ICD-10-CM | POA: Diagnosis not present

## 2019-11-03 DIAGNOSIS — N1832 Chronic kidney disease, stage 3b: Secondary | ICD-10-CM | POA: Diagnosis not present

## 2019-11-03 DIAGNOSIS — C9000 Multiple myeloma not having achieved remission: Secondary | ICD-10-CM

## 2019-11-03 DIAGNOSIS — E782 Mixed hyperlipidemia: Secondary | ICD-10-CM | POA: Diagnosis not present

## 2019-11-03 MED ORDER — NITROGLYCERIN 0.4 MG SL SUBL: 0.4000 mg | SUBLINGUAL_TABLET | SUBLINGUAL | 11 refills | Status: DC | PRN

## 2019-11-03 NOTE — Patient Instructions (Signed)
Medication Instructions:   Your physician recommends that you continue on your current medications as directed. Please refer to the Current Medication list given to you today.  *If you need a refill on your cardiac medications before your next appointment, please call your pharmacy*  Lab Work:  None ordered today  If you have labs (blood work) drawn today and your tests are completely normal, you will receive your results only by: Marland Kitchen MyChart Message (if you have MyChart) OR . A paper copy in the mail If you have any lab test that is abnormal or we need to change your treatment, we will call you to review the results.  Testing/Procedures:  None ordered today  Follow-Up: At [facility], you and your health needs are our priority.  As part of our continuing mission to provide you with exceptional heart care, we have created designated Provider Care Teams.  These Care Teams include your primary Cardiologist (physician) and Advanced Practice Providers (APPs -  Physician Assistants and Nurse Practitioners) who all work together to provide you with the care you need, when you need it.  Your next appointment:   6 month(s)  The format for your next appointment:   In Person  Provider:   You may see Sherren Mocha, MD or one of the following Advanced Practice Providers on your designated Care Team:    Richardson Dopp, PA-C  [place], [place]  Daune Perch, [place]

## 2019-11-05 ENCOUNTER — Other Ambulatory Visit: Payer: Self-pay

## 2019-11-05 DIAGNOSIS — C9000 Multiple myeloma not having achieved remission: Secondary | ICD-10-CM

## 2019-11-06 ENCOUNTER — Other Ambulatory Visit: Payer: Self-pay

## 2019-11-06 ENCOUNTER — Inpatient Hospital Stay: Payer: Medicare Other | Attending: Family

## 2019-11-06 ENCOUNTER — Inpatient Hospital Stay (HOSPITAL_BASED_OUTPATIENT_CLINIC_OR_DEPARTMENT_OTHER): Payer: Medicare Other | Admitting: Hematology & Oncology

## 2019-11-06 ENCOUNTER — Inpatient Hospital Stay: Payer: Medicare Other

## 2019-11-06 ENCOUNTER — Encounter: Payer: Self-pay | Admitting: Hematology & Oncology

## 2019-11-06 VITALS — BP 143/69 | HR 87 | Temp 97.1°F | Resp 19 | Wt 139.0 lb

## 2019-11-06 DIAGNOSIS — C9000 Multiple myeloma not having achieved remission: Secondary | ICD-10-CM | POA: Diagnosis not present

## 2019-11-06 DIAGNOSIS — Z79899 Other long term (current) drug therapy: Secondary | ICD-10-CM | POA: Diagnosis not present

## 2019-11-06 DIAGNOSIS — Z5111 Encounter for antineoplastic chemotherapy: Secondary | ICD-10-CM | POA: Diagnosis not present

## 2019-11-06 DIAGNOSIS — Z7982 Long term (current) use of aspirin: Secondary | ICD-10-CM | POA: Insufficient documentation

## 2019-11-06 DIAGNOSIS — I25119 Atherosclerotic heart disease of native coronary artery with unspecified angina pectoris: Secondary | ICD-10-CM

## 2019-11-06 LAB — CBC WITH DIFFERENTIAL (CANCER CENTER ONLY)
Abs Immature Granulocytes: 0.03 10*3/uL (ref 0.00–0.07)
Basophils Absolute: 0.1 10*3/uL (ref 0.0–0.1)
Basophils Relative: 1 %
Eosinophils Absolute: 0.1 10*3/uL (ref 0.0–0.5)
Eosinophils Relative: 2 %
HCT: 43.4 % (ref 39.0–52.0)
Hemoglobin: 14.4 g/dL (ref 13.0–17.0)
Immature Granulocytes: 0 %
Lymphocytes Relative: 29 %
Lymphs Abs: 2 10*3/uL (ref 0.7–4.0)
MCH: 33 pg (ref 26.0–34.0)
MCHC: 33.2 g/dL (ref 30.0–36.0)
MCV: 99.5 fL (ref 80.0–100.0)
Monocytes Absolute: 0.6 10*3/uL (ref 0.1–1.0)
Monocytes Relative: 8 %
Neutro Abs: 4.3 10*3/uL (ref 1.7–7.7)
Neutrophils Relative %: 60 %
Platelet Count: 216 10*3/uL (ref 150–400)
RBC: 4.36 MIL/uL (ref 4.22–5.81)
RDW: 12.2 % (ref 11.5–15.5)
WBC Count: 7.1 10*3/uL (ref 4.0–10.5)
nRBC: 0 % (ref 0.0–0.2)

## 2019-11-06 LAB — CMP (CANCER CENTER ONLY)
ALT: 11 U/L (ref 0–44)
AST: 11 U/L — ABNORMAL LOW (ref 15–41)
Albumin: 3.8 g/dL (ref 3.5–5.0)
Alkaline Phosphatase: 46 U/L (ref 38–126)
Anion gap: 5 (ref 5–15)
BUN: 21 mg/dL (ref 8–23)
CO2: 31 mmol/L (ref 22–32)
Calcium: 9.5 mg/dL (ref 8.9–10.3)
Chloride: 106 mmol/L (ref 98–111)
Creatinine: 1.81 mg/dL — ABNORMAL HIGH (ref 0.61–1.24)
GFR, Est AFR Am: 39 mL/min — ABNORMAL LOW (ref >60)
GFR, Estimated: 34 mL/min — ABNORMAL LOW (ref >60)
Glucose, Bld: 112 mg/dL — ABNORMAL HIGH (ref 70–99)
Potassium: 4.8 mmol/L (ref 3.5–5.1)
Sodium: 142 mmol/L (ref 135–145)
Total Bilirubin: 0.7 mg/dL (ref 0.3–1.2)
Total Protein: 5.8 g/dL — ABNORMAL LOW (ref 6.5–8.1)

## 2019-11-06 MED ORDER — DENOSUMAB 120 MG/1.7ML ~~LOC~~ SOLN: 120.0000 mg | Freq: Once | SUBCUTANEOUS | Status: DC

## 2019-11-06 MED ORDER — PROCHLORPERAZINE MALEATE 10 MG PO TABS
ORAL_TABLET | ORAL | Status: AC
  Filled 2019-11-06: qty 1

## 2019-11-06 MED ORDER — DEXAMETHASONE 4 MG PO TABS
ORAL_TABLET | ORAL | Status: AC
  Filled 2019-11-06: qty 5

## 2019-11-06 MED ORDER — BORTEZOMIB CHEMO SQ INJECTION 3.5 MG (2.5MG/ML)
1.3000 mg/m2 | Freq: Once | INTRAMUSCULAR | Status: AC
  Administered 2019-11-06: 2.25 mg via SUBCUTANEOUS
  Filled 2019-11-06: qty 0.9

## 2019-11-06 MED ORDER — DEXAMETHASONE 4 MG PO TABS
20.0000 mg | ORAL_TABLET | ORAL | Status: DC
  Administered 2019-11-06: 20 mg via ORAL

## 2019-11-06 MED ORDER — PROCHLORPERAZINE MALEATE 10 MG PO TABS
10.0000 mg | ORAL_TABLET | Freq: Once | ORAL | Status: AC
  Administered 2019-11-06: 10 mg via ORAL

## 2019-11-06 NOTE — Progress Notes (Signed)
CBC and CMET reviewed by MD, Ok to treat despite counts °

## 2019-11-06 NOTE — Progress Notes (Signed)
Hematology and Oncology Follow Up Visit  Shawn Bauer [facility] [id number] 1933-11-22 86 y.o. 11/06/2019   Principle Diagnosis:   IgG Kappa myeloma -- high risk due to t(4:21)  Current Therapy:    Velcade/Decadron -- s/p cycle #1 -- started 10/09/2019     Interim History:  Shawn Bauer is back for follow-up.  He has had his first cycle of Velcade/Decadron.  He is doing well with this.  We checked his myeloma studies back in late January, his M spike was not found.  His kappa light chain went down from 39.4 mg/dL down to 25.3 mg/dL.  He says for the first night after treatment, he cannot sleep.  Unsure this is from the Decadron.  He has had no problems with bowels or bladder.  He has had no nausea or vomiting.  He has had no rashes.  There is been no fever.  He has had no headache.  Overall, his performance status is ECOG 1.    Medications:  Current Outpatient Medications:  .  aspirin 81 MG tablet, Take 81 mg by mouth daily., Disp: , Rfl:  .  budesonide-formoterol (SYMBICORT) 160-4.5 MCG/ACT inhaler, Inhale 2 puffs into the lungs 2 (two) times daily., Disp: , Rfl:  .  cyanocobalamin 1000 MCG tablet, Take 1,000 mcg by mouth daily., Disp: , Rfl:  .  famciclovir (FAMVIR) 250 MG tablet, Take 1 tablet (250 mg total) by mouth daily., Disp: 30 tablet, Rfl: 6 .  lovastatin (MEVACOR) 40 MG tablet, Take 2 tablets once a day, Disp: , Rfl:  .  metoprolol succinate (TOPROL-XL) 50 MG 24 hr tablet, Take 50 mg by mouth daily. Patient taking 1/2 tab in the morning and 1/2 tab in the evening. Total 50 mg per day. Take with or immediately following a meal., Disp: , Rfl:  .  nitroGLYCERIN (NITROSTAT) 0.4 MG SL tablet, Place 1 tablet (0.4 mg total) under the tongue every 5 (five) minutes as needed for chest pain., Disp: 25 tablet, Rfl: 11 .  olmesartan (BENICAR) 20 MG tablet, Take 20 mg by mouth daily., Disp: , Rfl:  .  traMADol (ULTRAM) 50 MG tablet, Take 50 mg by mouth 2 (two) times daily as needed., Disp: ,  Rfl:   Allergies: No Known Allergies  Past Medical History, Surgical history, Social history, and Family History were reviewed and updated.  Review of Systems: Review of Systems  Constitutional: Negative.   HENT:  Negative.   Eyes: Negative.   Respiratory: Negative.   Cardiovascular: Negative.   Gastrointestinal: Negative.   Endocrine: Negative.   Genitourinary: Negative.    Musculoskeletal: Negative.   Skin: Negative.   Neurological: Negative.   Hematological: Negative.   Psychiatric/Behavioral: Negative.     Physical Exam:  vitals were not taken for this visit.   Wt Readings from Last 3 Encounters:  11/03/19 141 lb 12.8 oz (64.3 kg)  09/30/19 140 lb (63.5 kg)  09/01/19 138 lb (62.6 kg)    Physical Exam Vitals reviewed.  HENT:     Head: Normocephalic and atraumatic.  Eyes:     Pupils: Pupils are equal, round, and reactive to light.  Cardiovascular:     Rate and Rhythm: Normal rate and regular rhythm.     Heart sounds: Normal heart sounds.  Pulmonary:     Effort: Pulmonary effort is normal.     Breath sounds: Normal breath sounds.  Abdominal:     General: Bowel sounds are normal.     Palpations: Abdomen is soft.  Musculoskeletal:  General: No tenderness or deformity. Normal range of motion.     Cervical back: Normal range of motion.  Lymphadenopathy:     Cervical: No cervical adenopathy.  Skin:    General: Skin is warm and dry.     Findings: No erythema or rash.  Neurological:     Mental Status: He is alert and oriented to person, place, and time.  Psychiatric:        Behavior: Behavior normal.        Thought Content: Thought content normal.        Judgment: Judgment normal.      Lab Results  Component Value Date   WBC 7.1 11/06/2019   HGB 14.4 11/06/2019   HCT 43.4 11/06/2019   MCV 99.5 11/06/2019   PLT 216 11/06/2019     Chemistry      Component Value Date/Time   NA 144 10/23/2019 1107   K 4.6 10/23/2019 1107   CL 106 10/23/2019  1107   CO2 31 10/23/2019 1107   BUN 15 10/23/2019 1107   CREATININE 1.64 (H) 10/23/2019 1107      Component Value Date/Time   CALCIUM 9.6 10/23/2019 1107   ALKPHOS 53 10/23/2019 1107   AST 11 (L) 10/23/2019 1107   ALT 12 10/23/2019 1107   BILITOT 0.7 10/23/2019 1107       Impression and Plan: Shawn Bauer is an 86 year old white male.  He has what looks like early myeloma.  Again, I would consider this at a higher risk given the translocation.  We will have to see how things look with his monoclonal studies.  Again, there is no monoclonal spike in his blood when we last checked it.  I am just happy that his quality of life is doing well right now.  We will plan to get him back in 1 more month for the start of his third cycle of treatment.  [name], MD 2/12/20218:09 AM

## 2019-11-06 NOTE — Patient Instructions (Addendum)
[place] Discharge Instructions for Patients Receiving Chemotherapy  Today you received the following chemotherapy agents Velcade  To help prevent nausea and vomiting after your treatment, we encourage you to take your nausea medication as prescribed by MD.   If you develop nausea and vomiting that is not controlled by your nausea medication, call the clinic.   BELOW ARE SYMPTOMS THAT SHOULD BE REPORTED IMMEDIATELY:  *FEVER GREATER THAN 100.5 F  *CHILLS WITH OR WITHOUT FEVER  NAUSEA AND VOMITING THAT IS NOT CONTROLLED WITH YOUR NAUSEA MEDICATION  *UNUSUAL SHORTNESS OF BREATH  *UNUSUAL BRUISING OR BLEEDING  TENDERNESS    Bortezomib injection What is this medicine? BORTEZOMIB (bor TEZ oh mib) is a medicine that targets proteins in cancer cells and stops the cancer cells from growing. It is used to treat multiple myeloma and mantle-cell lymphoma. This medicine may be used for other purposes; ask your health care provider or pharmacist if you have questions. COMMON BRAND NAME(S): Velcade What should I tell my health care provider before I take this medicine? They need to know if you have any of these conditions: diabetes heart disease irregular heartbeat liver disease on hemodialysis low blood counts, like low white blood cells, platelets, or hemoglobin peripheral neuropathy taking medicine for blood pressure an unusual or allergic reaction to bortezomib, mannitol, boron, other medicines, foods, dyes, or preservatives pregnant or trying to get pregnant breast-feeding How should I use this medicine? This medicine is for injection into a vein or for injection under the skin. It is given by a health care professional in a hospital or clinic setting. Talk to your pediatrician regarding the use of this medicine in children. Special care may be needed. Overdosage: If you think you have taken too much of this medicine contact a poison control center or emergency room  at once. NOTE: This medicine is only for you. Do not share this medicine with others. What if I miss a dose? It is important not to miss your dose. Call your doctor or health care professional if you are unable to keep an appointment. What may interact with this medicine? This medicine may interact with the following medications: ketoconazole rifampin ritonavir St. John's Wort This list may not describe all possible interactions. Give your health care provider a list of all the medicines, herbs, non-prescription drugs, or dietary supplements you use. Also tell them if you smoke, drink alcohol, or use illegal drugs. Some items may interact with your medicine. What should I watch for while using this medicine? You may get drowsy or dizzy. Do not drive, use machinery, or do anything that needs mental alertness until you know how this medicine affects you. Do not stand or sit up quickly, especially if you are an older patient. This reduces the risk of dizzy or fainting spells. In some cases, you may be given additional medicines to help with side effects. Follow all directions for their use. Call your doctor or health care professional for advice if you get a fever, chills or sore throat, or other symptoms of a cold or flu. Do not treat yourself. This drug decreases your body's ability to fight infections. Try to avoid being around people who are sick. This medicine may increase your risk to bruise or bleed. Call your doctor or health care professional if you notice any unusual bleeding. You may need blood work done while you are taking this medicine. In some patients, this medicine may cause a serious brain infection that may cause  death. If you have any problems seeing, thinking, speaking, walking, or standing, tell your doctor right away. If you cannot reach your doctor, urgently seek other source of medical care. Check with your doctor or health care professional if you get an attack of severe  diarrhea, nausea and vomiting, or if you sweat a lot. The loss of too much body fluid can make it dangerous for you to take this medicine. Do not become pregnant while taking this medicine or for at least 7 months after stopping it. Women should inform their doctor if they wish to become pregnant or think they might be pregnant. Men should not father a child while taking this medicine and for at least 4 months after stopping it. There is a potential for serious side effects to an unborn child. Talk to your health care professional or pharmacist for more information. Do not breast-feed an infant while taking this medicine or for 2 months after stopping it. This medicine may interfere with the ability to have a child. You should talk with your doctor or health care professional if you are concerned about your fertility. What side effects may I notice from receiving this medicine? Side effects that you should report to your doctor or health care professional as soon as possible: allergic reactions like skin rash, itching or hives, swelling of the face, lips, or tongue breathing problems changes in hearing changes in vision fast, irregular heartbeat feeling faint or lightheaded, falls pain, tingling, numbness in the hands or feet right upper belly pain seizures swelling of the ankles, feet, hands unusual bleeding or bruising unusually weak or tired vomiting yellowing of the eyes or skin Side effects that usually do not require medical attention (report to your doctor or health care professional if they continue or are bothersome): changes in emotions or moods constipation diarrhea loss of appetite headache irritation at site where injected nausea This list may not describe all possible side effects. Call your doctor for medical advice about side effects. You may report side effects to FDA at 1-800-FDA-1088. Where should I keep my medicine? This drug is given in a hospital or clinic and will  not be stored at home. NOTE: This sheet is a summary. It may not cover all possible information. If you have questions about this medicine, talk to your doctor, pharmacist, or health care provider.  2020 Elsevier/Gold Standard (2018-01-20 16:29:31)  IN MOUTH AND THROAT WITH OR WITHOUT PRESENCE OF ULCERS  *URINARY PROBLEMS  *BOWEL PROBLEMS  UNUSUAL RASH Items with * indicate a potential emergency and should be followed up as soon as possible.  Feel free to call the clinic should you have any questions or concerns. The clinic phone number is [phone].  Please show the [place] at check-in to the Emergency Department and triage nurse.

## 2019-11-13 ENCOUNTER — Inpatient Hospital Stay: Payer: Medicare Other

## 2019-11-16 ENCOUNTER — Inpatient Hospital Stay: Payer: Medicare Other

## 2019-11-16 ENCOUNTER — Other Ambulatory Visit: Payer: Self-pay

## 2019-11-16 VITALS — BP 143/62 | HR 74 | Temp 97.8°F | Resp 19

## 2019-11-16 DIAGNOSIS — Z5111 Encounter for antineoplastic chemotherapy: Secondary | ICD-10-CM | POA: Diagnosis not present

## 2019-11-16 DIAGNOSIS — C9000 Multiple myeloma not having achieved remission: Secondary | ICD-10-CM

## 2019-11-16 MED ORDER — PROCHLORPERAZINE MALEATE 10 MG PO TABS
ORAL_TABLET | ORAL | Status: AC
  Filled 2019-11-16: qty 1

## 2019-11-16 MED ORDER — DEXAMETHASONE 4 MG PO TABS
20.0000 mg | ORAL_TABLET | ORAL | Status: DC
  Administered 2019-11-16: 20 mg via ORAL

## 2019-11-16 MED ORDER — BORTEZOMIB CHEMO SQ INJECTION 3.5 MG (2.5MG/ML)
1.3000 mg/m2 | Freq: Once | INTRAMUSCULAR | Status: AC
  Administered 2019-11-16: 2.25 mg via SUBCUTANEOUS
  Filled 2019-11-16: qty 0.9

## 2019-11-16 MED ORDER — PROCHLORPERAZINE MALEATE 10 MG PO TABS
10.0000 mg | ORAL_TABLET | Freq: Once | ORAL | Status: AC
  Administered 2019-11-16: 10 mg via ORAL

## 2019-11-16 NOTE — Patient Instructions (Signed)
Bortezomib injection What is this medicine? BORTEZOMIB (bor TEZ oh mib) is a medicine that targets proteins in cancer cells and stops the cancer cells from growing. It is used to treat multiple myeloma and mantle-cell lymphoma. This medicine may be used for other purposes; ask your health care provider or pharmacist if you have questions. COMMON BRAND NAME(S): Velcade What should I tell my health care provider before I take this medicine? They need to know if you have any of these conditions:  diabetes  heart disease  irregular heartbeat  liver disease  on hemodialysis  low blood counts, like low white blood cells, platelets, or hemoglobin  peripheral neuropathy  taking medicine for blood pressure  an unusual or allergic reaction to bortezomib, mannitol, boron, other medicines, foods, dyes, or preservatives  pregnant or trying to get pregnant  breast-feeding How should I use this medicine? This medicine is for injection into a vein or for injection under the skin. It is given by a health care professional in a hospital or clinic setting. Talk to your pediatrician regarding the use of this medicine in children. Special care may be needed. Overdosage: If you think you have taken too much of this medicine contact a poison control center or emergency room at once. NOTE: This medicine is only for you. Do not share this medicine with others. What if I miss a dose? It is important not to miss your dose. Call your doctor or health care professional if you are unable to keep an appointment. What may interact with this medicine? This medicine may interact with the following medications:  ketoconazole  rifampin  ritonavir  St. John's Wort This list may not describe all possible interactions. Give your health care provider a list of all the medicines, herbs, non-prescription drugs, or dietary supplements you use. Also tell them if you smoke, drink alcohol, or use illegal drugs. Some  items may interact with your medicine. What should I watch for while using this medicine? You may get drowsy or dizzy. Do not drive, use machinery, or do anything that needs mental alertness until you know how this medicine affects you. Do not stand or sit up quickly, especially if you are an older patient. This reduces the risk of dizzy or fainting spells. In some cases, you may be given additional medicines to help with side effects. Follow all directions for their use. Call your doctor or health care professional for advice if you get a fever, chills or sore throat, or other symptoms of a cold or flu. Do not treat yourself. This drug decreases your body's ability to fight infections. Try to avoid being around people who are sick. This medicine may increase your risk to bruise or bleed. Call your doctor or health care professional if you notice any unusual bleeding. You may need blood work done while you are taking this medicine. In some patients, this medicine may cause a serious brain infection that may cause death. If you have any problems seeing, thinking, speaking, walking, or standing, tell your doctor right away. If you cannot reach your doctor, urgently seek other source of medical care. Check with your doctor or health care professional if you get an attack of severe diarrhea, nausea and vomiting, or if you sweat a lot. The loss of too much body fluid can make it dangerous for you to take this medicine. Do not become pregnant while taking this medicine or for at least 7 months after stopping it. Women should inform their doctor   if they wish to become pregnant or think they might be pregnant. Men should not father a child while taking this medicine and for at least 4 months after stopping it. There is a potential for serious side effects to an unborn child. Talk to your health care professional or pharmacist for more information. Do not breast-feed an infant while taking this medicine or for 2  months after stopping it. This medicine may interfere with the ability to have a child. You should talk with your doctor or health care professional if you are concerned about your fertility. What side effects may I notice from receiving this medicine? Side effects that you should report to your doctor or health care professional as soon as possible:  allergic reactions like skin rash, itching or hives, swelling of the face, lips, or tongue  breathing problems  changes in hearing  changes in vision  fast, irregular heartbeat  feeling faint or lightheaded, falls  pain, tingling, numbness in the hands or feet  right upper belly pain  seizures  swelling of the ankles, feet, hands  unusual bleeding or bruising  unusually weak or tired  vomiting  yellowing of the eyes or skin Side effects that usually do not require medical attention (report to your doctor or health care professional if they continue or are bothersome):  changes in emotions or moods  constipation  diarrhea  loss of appetite  headache  irritation at site where injected  nausea This list may not describe all possible side effects. Call your doctor for medical advice about side effects. You may report side effects to FDA at 1-800-FDA-1088. Where should I keep my medicine? This drug is given in a hospital or clinic and will not be stored at home. NOTE: This sheet is a summary. It may not cover all possible information. If you have questions about this medicine, talk to your doctor, pharmacist, or health care provider.  2020 Elsevier/Gold Standard (2018-01-20 16:29:31)  

## 2019-11-20 ENCOUNTER — Inpatient Hospital Stay: Payer: Medicare Other

## 2019-11-20 ENCOUNTER — Other Ambulatory Visit: Payer: Self-pay

## 2019-11-20 VITALS — BP 123/63 | HR 71 | Temp 97.1°F | Resp 18

## 2019-11-20 DIAGNOSIS — Z5111 Encounter for antineoplastic chemotherapy: Secondary | ICD-10-CM | POA: Diagnosis not present

## 2019-11-20 DIAGNOSIS — C9000 Multiple myeloma not having achieved remission: Secondary | ICD-10-CM

## 2019-11-20 MED ORDER — DEXAMETHASONE 4 MG PO TABS
20.0000 mg | ORAL_TABLET | ORAL | Status: DC
  Administered 2019-11-20: 20 mg via ORAL

## 2019-11-20 MED ORDER — DEXAMETHASONE 4 MG PO TABS
ORAL_TABLET | ORAL | Status: AC
  Filled 2019-11-20: qty 5

## 2019-11-20 MED ORDER — PROCHLORPERAZINE MALEATE 10 MG PO TABS
10.0000 mg | ORAL_TABLET | Freq: Once | ORAL | Status: AC
  Administered 2019-11-20: 10 mg via ORAL

## 2019-11-20 MED ORDER — BORTEZOMIB CHEMO SQ INJECTION 3.5 MG (2.5MG/ML)
1.3000 mg/m2 | Freq: Once | INTRAMUSCULAR | Status: AC
  Administered 2019-11-20: 2.25 mg via SUBCUTANEOUS
  Filled 2019-11-20: qty 0.9

## 2019-11-20 MED ORDER — PROCHLORPERAZINE MALEATE 10 MG PO TABS
ORAL_TABLET | ORAL | Status: AC
  Filled 2019-11-20: qty 1

## 2019-11-20 NOTE — Patient Instructions (Signed)
Bortezomib injection What is this medicine? BORTEZOMIB (bor TEZ oh mib) is a medicine that targets proteins in cancer cells and stops the cancer cells from growing. It is used to treat multiple myeloma and mantle-cell lymphoma. This medicine may be used for other purposes; ask your health care provider or pharmacist if you have questions. COMMON BRAND NAME(S): Velcade What should I tell my health care provider before I take this medicine? They need to know if you have any of these conditions:  diabetes  heart disease  irregular heartbeat  liver disease  on hemodialysis  low blood counts, like low white blood cells, platelets, or hemoglobin  peripheral neuropathy  taking medicine for blood pressure  an unusual or allergic reaction to bortezomib, mannitol, boron, other medicines, foods, dyes, or preservatives  pregnant or trying to get pregnant  breast-feeding How should I use this medicine? This medicine is for injection into a vein or for injection under the skin. It is given by a health care professional in a hospital or clinic setting. Talk to your pediatrician regarding the use of this medicine in children. Special care may be needed. Overdosage: If you think you have taken too much of this medicine contact a poison control center or emergency room at once. NOTE: This medicine is only for you. Do not share this medicine with others. What if I miss a dose? It is important not to miss your dose. Call your doctor or health care professional if you are unable to keep an appointment. What may interact with this medicine? This medicine may interact with the following medications:  ketoconazole  rifampin  ritonavir  St. John's Wort This list may not describe all possible interactions. Give your health care provider a list of all the medicines, herbs, non-prescription drugs, or dietary supplements you use. Also tell them if you smoke, drink alcohol, or use illegal drugs. Some  items may interact with your medicine. What should I watch for while using this medicine? You may get drowsy or dizzy. Do not drive, use machinery, or do anything that needs mental alertness until you know how this medicine affects you. Do not stand or sit up quickly, especially if you are an older patient. This reduces the risk of dizzy or fainting spells. In some cases, you may be given additional medicines to help with side effects. Follow all directions for their use. Call your doctor or health care professional for advice if you get a fever, chills or sore throat, or other symptoms of a cold or flu. Do not treat yourself. This drug decreases your body's ability to fight infections. Try to avoid being around people who are sick. This medicine may increase your risk to bruise or bleed. Call your doctor or health care professional if you notice any unusual bleeding. You may need blood work done while you are taking this medicine. In some patients, this medicine may cause a serious brain infection that may cause death. If you have any problems seeing, thinking, speaking, walking, or standing, tell your doctor right away. If you cannot reach your doctor, urgently seek other source of medical care. Check with your doctor or health care professional if you get an attack of severe diarrhea, nausea and vomiting, or if you sweat a lot. The loss of too much body fluid can make it dangerous for you to take this medicine. Do not become pregnant while taking this medicine or for at least 7 months after stopping it. Women should inform their doctor   if they wish to become pregnant or think they might be pregnant. Men should not father a child while taking this medicine and for at least 4 months after stopping it. There is a potential for serious side effects to an unborn child. Talk to your health care professional or pharmacist for more information. Do not breast-feed an infant while taking this medicine or for 2  months after stopping it. This medicine may interfere with the ability to have a child. You should talk with your doctor or health care professional if you are concerned about your fertility. What side effects may I notice from receiving this medicine? Side effects that you should report to your doctor or health care professional as soon as possible:  allergic reactions like skin rash, itching or hives, swelling of the face, lips, or tongue  breathing problems  changes in hearing  changes in vision  fast, irregular heartbeat  feeling faint or lightheaded, falls  pain, tingling, numbness in the hands or feet  right upper belly pain  seizures  swelling of the ankles, feet, hands  unusual bleeding or bruising  unusually weak or tired  vomiting  yellowing of the eyes or skin Side effects that usually do not require medical attention (report to your doctor or health care professional if they continue or are bothersome):  changes in emotions or moods  constipation  diarrhea  loss of appetite  headache  irritation at site where injected  nausea This list may not describe all possible side effects. Call your doctor for medical advice about side effects. You may report side effects to FDA at 1-800-FDA-1088. Where should I keep my medicine? This drug is given in a hospital or clinic and will not be stored at home. NOTE: This sheet is a summary. It may not cover all possible information. If you have questions about this medicine, talk to your doctor, pharmacist, or health care provider.  2020 Elsevier/Gold Standard (2018-01-20 16:29:31)  

## 2019-11-20 NOTE — Progress Notes (Signed)
Per dr Marin Olp no labs needed for velcade today

## 2019-12-04 ENCOUNTER — Encounter (INDEPENDENT_AMBULATORY_CARE_PROVIDER_SITE_OTHER): Payer: Self-pay

## 2019-12-04 ENCOUNTER — Inpatient Hospital Stay (HOSPITAL_BASED_OUTPATIENT_CLINIC_OR_DEPARTMENT_OTHER): Payer: Medicare Other | Admitting: Family

## 2019-12-04 ENCOUNTER — Other Ambulatory Visit: Payer: Self-pay

## 2019-12-04 ENCOUNTER — Inpatient Hospital Stay: Payer: Medicare Other | Attending: Family

## 2019-12-04 ENCOUNTER — Encounter: Payer: Self-pay | Admitting: Family

## 2019-12-04 ENCOUNTER — Inpatient Hospital Stay: Payer: Medicare Other

## 2019-12-04 VITALS — BP 138/61 | HR 71 | Temp 97.5°F | Resp 17 | Wt 139.0 lb

## 2019-12-04 DIAGNOSIS — I25119 Atherosclerotic heart disease of native coronary artery with unspecified angina pectoris: Secondary | ICD-10-CM

## 2019-12-04 DIAGNOSIS — Z5111 Encounter for antineoplastic chemotherapy: Secondary | ICD-10-CM | POA: Insufficient documentation

## 2019-12-04 DIAGNOSIS — Z79899 Other long term (current) drug therapy: Secondary | ICD-10-CM | POA: Diagnosis not present

## 2019-12-04 DIAGNOSIS — C9000 Multiple myeloma not having achieved remission: Secondary | ICD-10-CM | POA: Insufficient documentation

## 2019-12-04 LAB — CBC WITH DIFFERENTIAL (CANCER CENTER ONLY)
Abs Immature Granulocytes: 0.02 10*3/uL (ref 0.00–0.07)
Basophils Absolute: 0.1 10*3/uL (ref 0.0–0.1)
Basophils Relative: 1 %
Eosinophils Absolute: 0.1 10*3/uL (ref 0.0–0.5)
Eosinophils Relative: 2 %
HCT: 43.8 % (ref 39.0–52.0)
Hemoglobin: 14.2 g/dL (ref 13.0–17.0)
Immature Granulocytes: 0 %
Lymphocytes Relative: 28 %
Lymphs Abs: 1.8 10*3/uL (ref 0.7–4.0)
MCH: 32.8 pg (ref 26.0–34.0)
MCHC: 32.4 g/dL (ref 30.0–36.0)
MCV: 101.2 fL — ABNORMAL HIGH (ref 80.0–100.0)
Monocytes Absolute: 0.7 10*3/uL (ref 0.1–1.0)
Monocytes Relative: 10 %
Neutro Abs: 3.9 10*3/uL (ref 1.7–7.7)
Neutrophils Relative %: 59 %
Platelet Count: 201 10*3/uL (ref 150–400)
RBC: 4.33 MIL/uL (ref 4.22–5.81)
RDW: 12.7 % (ref 11.5–15.5)
WBC Count: 6.6 10*3/uL (ref 4.0–10.5)
nRBC: 0 % (ref 0.0–0.2)

## 2019-12-04 LAB — CMP (CANCER CENTER ONLY)
ALT: 12 U/L (ref 0–44)
AST: 14 U/L — ABNORMAL LOW (ref 15–41)
Albumin: 4.1 g/dL (ref 3.5–5.0)
Alkaline Phosphatase: 46 U/L (ref 38–126)
Anion gap: 5 (ref 5–15)
BUN: 23 mg/dL (ref 8–23)
CO2: 32 mmol/L (ref 22–32)
Calcium: 9.4 mg/dL (ref 8.9–10.3)
Chloride: 105 mmol/L (ref 98–111)
Creatinine: 1.57 mg/dL — ABNORMAL HIGH (ref 0.61–1.24)
GFR, Est AFR Am: 47 mL/min — ABNORMAL LOW (ref >60)
GFR, Estimated: 40 mL/min — ABNORMAL LOW (ref >60)
Glucose, Bld: 121 mg/dL — ABNORMAL HIGH (ref 70–99)
Potassium: 4.4 mmol/L (ref 3.5–5.1)
Sodium: 142 mmol/L (ref 135–145)
Total Bilirubin: 0.8 mg/dL (ref 0.3–1.2)
Total Protein: 6.2 g/dL — ABNORMAL LOW (ref 6.5–8.1)

## 2019-12-04 MED ORDER — BORTEZOMIB CHEMO SQ INJECTION 3.5 MG (2.5MG/ML)
1.3000 mg/m2 | Freq: Once | INTRAMUSCULAR | Status: AC
  Administered 2019-12-04: 2.25 mg via SUBCUTANEOUS
  Filled 2019-12-04: qty 0.9

## 2019-12-04 MED ORDER — PROCHLORPERAZINE MALEATE 10 MG PO TABS
ORAL_TABLET | ORAL | Status: AC
  Filled 2019-12-04: qty 1

## 2019-12-04 MED ORDER — PROCHLORPERAZINE MALEATE 10 MG PO TABS
10.0000 mg | ORAL_TABLET | Freq: Once | ORAL | Status: AC
  Administered 2019-12-04: 10 mg via ORAL

## 2019-12-04 MED ORDER — DEXAMETHASONE 4 MG PO TABS
20.0000 mg | ORAL_TABLET | ORAL | Status: DC
  Administered 2019-12-04: 20 mg via ORAL

## 2019-12-04 MED ORDER — DEXAMETHASONE 4 MG PO TABS
ORAL_TABLET | ORAL | Status: AC
  Filled 2019-12-04: qty 5

## 2019-12-04 NOTE — Progress Notes (Signed)
Hematology and Oncology Follow Up Visit  Shawn Bauer [facility] [id number] [birth year] 86 y.o. 12/04/2019   Principle Diagnosis:  IgG Kappa myeloma -- high risk due to t(4:21)  Current Therapy:   Velcade/Decadron -- started 10/09/2019, s/p cycle 2   Interim History:  Shawn Bauer is here today for follow-up and treatment. He is doing well and has no complaints at this time.  He has trouble sleeping the night after treatment due to the decadron but this resolves by the second day.  No M-spike detected in January, kappa light chains were 25.26 mg/dL and IgG level 661.  He had an episode of angina a month ago that resolved without intervention. He did let his cardiologist know and he now has nitro tabs if needed. He has not had any new episodes.  No fever, chills, n/v, cough, rash, dizziness, SOB, palpitations, abdominal pain or changes in bowel or bladder habits.  No episodes of bleeding. No bruising or petechiae.  No swelling, tenderness, numbness or tingling in his extremities.  No falls or syncope.  He has maintained a good appetite and is staying well hydrated. His weight is stable.   ECOG Performance Status: 1 - Symptomatic but completely ambulatory  Medications:  Allergies as of 12/04/2019   No Known Allergies     Medication List       Accurate as of December 04, 2019  8:52 AM. If you have any questions, ask your nurse or doctor.        aspirin 81 MG tablet Take 81 mg by mouth daily.   budesonide-formoterol 160-4.5 MCG/ACT inhaler Commonly known as: SYMBICORT Inhale 2 puffs into the lungs 2 (two) times daily.   cyanocobalamin 1000 MCG tablet Take 1,000 mcg by mouth daily.   famciclovir 250 MG tablet Commonly known as: FAMVIR Take 1 tablet (250 mg total) by mouth daily.   lovastatin 40 MG tablet Commonly known as: MEVACOR Take 2 tablets once a day   metoprolol succinate 50 MG 24 hr tablet Commonly known as: TOPROL-XL Take 50 mg by mouth daily. Patient taking 1/2 tab in  the morning and 1/2 tab in the evening. Total 50 mg per day. Take with or immediately following a meal.   nitroGLYCERIN 0.4 MG SL tablet Commonly known as: NITROSTAT Place 1 tablet (0.4 mg total) under the tongue every 5 (five) minutes as needed for chest pain.   olmesartan 20 MG tablet Commonly known as: BENICAR Take 20 mg by mouth daily.   traMADol 50 MG tablet Commonly known as: ULTRAM Take 50 mg by mouth 2 (two) times daily as needed.       Allergies: No Known Allergies  Past Medical History, Surgical history, Social history, and Family History were reviewed and updated.  Review of Systems: All other 10 point review of systems is negative.   Physical Exam:  weight is 139 lb (63 kg). His temporal temperature is 97.5 F (36.4 C) (abnormal). His blood pressure is 138/61 and his pulse is 71. His respiration is 17 and oxygen saturation is 97%.   Wt Readings from Last 3 Encounters:  12/04/19 139 lb (63 kg)  11/06/19 139 lb (63 kg)  11/03/19 141 lb 12.8 oz (64.3 kg)    Ocular: Sclerae unicteric, pupils equal, round and reactive to light Ear-nose-throat: Oropharynx clear, dentition fair Lymphatic: No cervical or supraclavicular adenopathy Lungs no rales or rhonchi, good excursion bilaterally Heart regular rate and rhythm, no murmur appreciated Abd soft, nontender, positive bowel sounds, no liver or spleen  tip palpated on exam, no fluid wave  MSK no focal spinal tenderness, no joint edema Neuro: non-focal, well-oriented, appropriate affect Breasts: Deferred   Lab Results  Component Value Date   WBC 6.6 12/04/2019   HGB 14.2 12/04/2019   HCT 43.8 12/04/2019   MCV 101.2 (H) 12/04/2019   PLT 201 12/04/2019   No results found for: FERRITIN, IRON, TIBC, UIBC, IRONPCTSAT Lab Results  Component Value Date   RBC 4.33 12/04/2019   Lab Results  Component Value Date   KPAFRELGTCHN 252.6 (H) 10/23/2019   LAMBDASER 10.3 10/23/2019   KAPLAMBRATIO 24.52 (H) 10/23/2019   Lab  Results  Component Value Date   IGGSERUM 661 10/23/2019   IGA 135 10/23/2019   IGMSERUM 25 10/23/2019   Lab Results  Component Value Date   TOTALPROTELP 5.7 (L) 10/23/2019   ALBUMINELP 3.3 10/23/2019   A1GS 0.2 10/23/2019   A2GS 0.7 10/23/2019   BETS 0.8 10/23/2019   GAMS 0.7 10/23/2019   MSPIKE Not Observed 10/23/2019   SPEI Comment 09/01/2019     Chemistry      Component Value Date/Time   NA 142 12/04/2019 0802   K 4.4 12/04/2019 0802   CL 105 12/04/2019 0802   CO2 32 12/04/2019 0802   BUN 23 12/04/2019 0802   CREATININE 1.57 (H) 12/04/2019 0802      Component Value Date/Time   CALCIUM 9.4 12/04/2019 0802   ALKPHOS 46 12/04/2019 0802   AST 14 (L) 12/04/2019 0802   ALT 12 12/04/2019 0802   BILITOT 0.8 12/04/2019 0802       Impression and Plan: Shawn Bauer is a very pleasant 86 yo caucasian gentleman with IgG kappa myeloma, high risk translocation.  He is tolerating treatment well and appears to be having a nice response. Myeloma studies were redrawn today and results are pending.  We will proceed with cycle 3 today as planned.  We will plan to see him again in another month.  He will contact our office with any questions or concerns. We can certainly see him sooner if needed.    Laverna Peace, NP 3/12/20218:52 AM

## 2019-12-04 NOTE — Patient Instructions (Signed)
Bortezomib injection What is this medicine? BORTEZOMIB (bor TEZ oh mib) is a medicine that targets proteins in cancer cells and stops the cancer cells from growing. It is used to treat multiple myeloma and mantle-cell lymphoma. This medicine may be used for other purposes; ask your health care provider or pharmacist if you have questions. COMMON BRAND NAME(S): Velcade What should I tell my health care provider before I take this medicine? They need to know if you have any of these conditions:  diabetes  heart disease  irregular heartbeat  liver disease  on hemodialysis  low blood counts, like low white blood cells, platelets, or hemoglobin  peripheral neuropathy  taking medicine for blood pressure  an unusual or allergic reaction to bortezomib, mannitol, boron, other medicines, foods, dyes, or preservatives  pregnant or trying to get pregnant  breast-feeding How should I use this medicine? This medicine is for injection into a vein or for injection under the skin. It is given by a health care professional in a hospital or clinic setting. Talk to your pediatrician regarding the use of this medicine in children. Special care may be needed. Overdosage: If you think you have taken too much of this medicine contact a poison control center or emergency room at once. NOTE: This medicine is only for you. Do not share this medicine with others. What if I miss a dose? It is important not to miss your dose. Call your doctor or health care professional if you are unable to keep an appointment. What may interact with this medicine? This medicine may interact with the following medications:  ketoconazole  rifampin  ritonavir  St. John's Wort This list may not describe all possible interactions. Give your health care provider a list of all the medicines, herbs, non-prescription drugs, or dietary supplements you use. Also tell them if you smoke, drink alcohol, or use illegal drugs. Some  items may interact with your medicine. What should I watch for while using this medicine? You may get drowsy or dizzy. Do not drive, use machinery, or do anything that needs mental alertness until you know how this medicine affects you. Do not stand or sit up quickly, especially if you are an older patient. This reduces the risk of dizzy or fainting spells. In some cases, you may be given additional medicines to help with side effects. Follow all directions for their use. Call your doctor or health care professional for advice if you get a fever, chills or sore throat, or other symptoms of a cold or flu. Do not treat yourself. This drug decreases your body's ability to fight infections. Try to avoid being around people who are sick. This medicine may increase your risk to bruise or bleed. Call your doctor or health care professional if you notice any unusual bleeding. You may need blood work done while you are taking this medicine. In some patients, this medicine may cause a serious brain infection that may cause death. If you have any problems seeing, thinking, speaking, walking, or standing, tell your doctor right away. If you cannot reach your doctor, urgently seek other source of medical care. Check with your doctor or health care professional if you get an attack of severe diarrhea, nausea and vomiting, or if you sweat a lot. The loss of too much body fluid can make it dangerous for you to take this medicine. Do not become pregnant while taking this medicine or for at least 7 months after stopping it. Women should inform their doctor   if they wish to become pregnant or think they might be pregnant. Men should not father a child while taking this medicine and for at least 4 months after stopping it. There is a potential for serious side effects to an unborn child. Talk to your health care professional or pharmacist for more information. Do not breast-feed an infant while taking this medicine or for 2  months after stopping it. This medicine may interfere with the ability to have a child. You should talk with your doctor or health care professional if you are concerned about your fertility. What side effects may I notice from receiving this medicine? Side effects that you should report to your doctor or health care professional as soon as possible:  allergic reactions like skin rash, itching or hives, swelling of the face, lips, or tongue  breathing problems  changes in hearing  changes in vision  fast, irregular heartbeat  feeling faint or lightheaded, falls  pain, tingling, numbness in the hands or feet  right upper belly pain  seizures  swelling of the ankles, feet, hands  unusual bleeding or bruising  unusually weak or tired  vomiting  yellowing of the eyes or skin Side effects that usually do not require medical attention (report to your doctor or health care professional if they continue or are bothersome):  changes in emotions or moods  constipation  diarrhea  loss of appetite  headache  irritation at site where injected  nausea This list may not describe all possible side effects. Call your doctor for medical advice about side effects. You may report side effects to FDA at 1-800-FDA-1088. Where should I keep my medicine? This drug is given in a hospital or clinic and will not be stored at home. NOTE: This sheet is a summary. It may not cover all possible information. If you have questions about this medicine, talk to your doctor, pharmacist, or health care provider.  2020 Elsevier/Gold Standard (2018-01-20 16:29:31)  

## 2019-12-04 NOTE — Addendum Note (Signed)
Addended by: [name] on: 12/04/2019 09:38 AM   Modules accepted: Orders

## 2019-12-05 LAB — IGG, IGA, IGM
IgA: 113 mg/dL (ref 61–437)
IgG (Immunoglobin G), Serum: 706 mg/dL (ref 603–1613)
IgM (Immunoglobulin M), Srm: 22 mg/dL (ref 15–143)

## 2019-12-07 LAB — KAPPA/LAMBDA LIGHT CHAINS
Kappa free light chain: 236.3 mg/L — ABNORMAL HIGH (ref 3.3–19.4)
Kappa, lambda light chain ratio: 22.29 — ABNORMAL HIGH (ref 0.26–1.65)
Lambda free light chains: 10.6 mg/L (ref 5.7–26.3)

## 2019-12-08 LAB — PROTEIN ELECTROPHORESIS, SERUM, WITH REFLEX
A/G Ratio: 1.5 (ref 0.7–1.7)
Albumin ELP: 3.6 g/dL (ref 2.9–4.4)
Alpha-1-Globulin: 0.2 g/dL (ref 0.0–0.4)
Alpha-2-Globulin: 0.8 g/dL (ref 0.4–1.0)
Beta Globulin: 0.7 g/dL (ref 0.7–1.3)
Gamma Globulin: 0.6 g/dL (ref 0.4–1.8)
Globulin, Total: 2.4 g/dL (ref 2.2–3.9)
Total Protein ELP: 6 g/dL (ref 6.0–8.5)

## 2019-12-10 ENCOUNTER — Other Ambulatory Visit: Payer: Self-pay | Admitting: *Deleted

## 2019-12-10 DIAGNOSIS — C9000 Multiple myeloma not having achieved remission: Secondary | ICD-10-CM

## 2019-12-11 ENCOUNTER — Other Ambulatory Visit: Payer: Self-pay

## 2019-12-11 ENCOUNTER — Inpatient Hospital Stay: Payer: Medicare Other

## 2019-12-11 VITALS — BP 127/62 | HR 70 | Temp 97.7°F | Resp 18 | Wt 139.0 lb

## 2019-12-11 DIAGNOSIS — Z5111 Encounter for antineoplastic chemotherapy: Secondary | ICD-10-CM | POA: Diagnosis not present

## 2019-12-11 DIAGNOSIS — C9000 Multiple myeloma not having achieved remission: Secondary | ICD-10-CM

## 2019-12-11 LAB — CBC WITH DIFFERENTIAL (CANCER CENTER ONLY)
Abs Immature Granulocytes: 0.03 10*3/uL (ref 0.00–0.07)
Basophils Absolute: 0.1 10*3/uL (ref 0.0–0.1)
Basophils Relative: 1 %
Eosinophils Absolute: 0.2 10*3/uL (ref 0.0–0.5)
Eosinophils Relative: 3 %
HCT: 43.4 % (ref 39.0–52.0)
Hemoglobin: 14 g/dL (ref 13.0–17.0)
Immature Granulocytes: 0 %
Lymphocytes Relative: 32 %
Lymphs Abs: 2.2 10*3/uL (ref 0.7–4.0)
MCH: 32.8 pg (ref 26.0–34.0)
MCHC: 32.3 g/dL (ref 30.0–36.0)
MCV: 101.6 fL — ABNORMAL HIGH (ref 80.0–100.0)
Monocytes Absolute: 0.8 10*3/uL (ref 0.1–1.0)
Monocytes Relative: 11 %
Neutro Abs: 3.6 10*3/uL (ref 1.7–7.7)
Neutrophils Relative %: 53 %
Platelet Count: 183 10*3/uL (ref 150–400)
RBC: 4.27 MIL/uL (ref 4.22–5.81)
RDW: 12.5 % (ref 11.5–15.5)
WBC Count: 7 10*3/uL (ref 4.0–10.5)
nRBC: 0 % (ref 0.0–0.2)

## 2019-12-11 LAB — CMP (CANCER CENTER ONLY)
ALT: 12 U/L (ref 0–44)
AST: 12 U/L — ABNORMAL LOW (ref 15–41)
Albumin: 3.9 g/dL (ref 3.5–5.0)
Alkaline Phosphatase: 45 U/L (ref 38–126)
Anion gap: 5 (ref 5–15)
BUN: 24 mg/dL — ABNORMAL HIGH (ref 8–23)
CO2: 31 mmol/L (ref 22–32)
Calcium: 9.7 mg/dL (ref 8.9–10.3)
Chloride: 107 mmol/L (ref 98–111)
Creatinine: 1.78 mg/dL — ABNORMAL HIGH (ref 0.61–1.24)
GFR, Est AFR Am: 40 mL/min — ABNORMAL LOW (ref >60)
GFR, Estimated: 35 mL/min — ABNORMAL LOW (ref >60)
Glucose, Bld: 110 mg/dL — ABNORMAL HIGH (ref 70–99)
Potassium: 5.2 mmol/L — ABNORMAL HIGH (ref 3.5–5.1)
Sodium: 143 mmol/L (ref 135–145)
Total Bilirubin: 0.9 mg/dL (ref 0.3–1.2)
Total Protein: 6 g/dL — ABNORMAL LOW (ref 6.5–8.1)

## 2019-12-11 MED ORDER — DENOSUMAB 120 MG/1.7ML ~~LOC~~ SOLN: 120.0000 mg | Freq: Once | SUBCUTANEOUS | Status: DC

## 2019-12-11 MED ORDER — PROCHLORPERAZINE MALEATE 10 MG PO TABS
ORAL_TABLET | ORAL | Status: AC
  Filled 2019-12-11: qty 1

## 2019-12-11 MED ORDER — DEXAMETHASONE 4 MG PO TABS
20.0000 mg | ORAL_TABLET | ORAL | Status: DC
  Administered 2019-12-11: 20 mg via ORAL

## 2019-12-11 MED ORDER — PROCHLORPERAZINE MALEATE 10 MG PO TABS
10.0000 mg | ORAL_TABLET | Freq: Once | ORAL | Status: AC
  Administered 2019-12-11: 10 mg via ORAL

## 2019-12-11 MED ORDER — BORTEZOMIB CHEMO SQ INJECTION 3.5 MG (2.5MG/ML)
1.3000 mg/m2 | Freq: Once | INTRAMUSCULAR | Status: AC
  Administered 2019-12-11: 2.25 mg via SUBCUTANEOUS
  Filled 2019-12-11: qty 0.9

## 2019-12-11 MED ORDER — DEXAMETHASONE 4 MG PO TABS
ORAL_TABLET | ORAL | Status: AC
  Filled 2019-12-11: qty 5

## 2019-12-11 NOTE — Patient Instructions (Signed)
Bortezomib injection What is this medicine? BORTEZOMIB (bor TEZ oh mib) is a medicine that targets proteins in cancer cells and stops the cancer cells from growing. It is used to treat multiple myeloma and mantle-cell lymphoma. This medicine may be used for other purposes; ask your health care provider or pharmacist if you have questions. COMMON BRAND NAME(S): Velcade What should I tell my health care provider before I take this medicine? They need to know if you have any of these conditions:  diabetes  heart disease  irregular heartbeat  liver disease  on hemodialysis  low blood counts, like low white blood cells, platelets, or hemoglobin  peripheral neuropathy  taking medicine for blood pressure  an unusual or allergic reaction to bortezomib, mannitol, boron, other medicines, foods, dyes, or preservatives  pregnant or trying to get pregnant  breast-feeding How should I use this medicine? This medicine is for injection into a vein or for injection under the skin. It is given by a health care professional in a hospital or clinic setting. Talk to your pediatrician regarding the use of this medicine in children. Special care may be needed. Overdosage: If you think you have taken too much of this medicine contact a poison control center or emergency room at once. NOTE: This medicine is only for you. Do not share this medicine with others. What if I miss a dose? It is important not to miss your dose. Call your doctor or health care professional if you are unable to keep an appointment. What may interact with this medicine? This medicine may interact with the following medications:  ketoconazole  rifampin  ritonavir  St. John's Wort This list may not describe all possible interactions. Give your health care provider a list of all the medicines, herbs, non-prescription drugs, or dietary supplements you use. Also tell them if you smoke, drink alcohol, or use illegal drugs. Some  items may interact with your medicine. What should I watch for while using this medicine? You may get drowsy or dizzy. Do not drive, use machinery, or do anything that needs mental alertness until you know how this medicine affects you. Do not stand or sit up quickly, especially if you are an older patient. This reduces the risk of dizzy or fainting spells. In some cases, you may be given additional medicines to help with side effects. Follow all directions for their use. Call your doctor or health care professional for advice if you get a fever, chills or sore throat, or other symptoms of a cold or flu. Do not treat yourself. This drug decreases your body's ability to fight infections. Try to avoid being around people who are sick. This medicine may increase your risk to bruise or bleed. Call your doctor or health care professional if you notice any unusual bleeding. You may need blood work done while you are taking this medicine. In some patients, this medicine may cause a serious brain infection that may cause death. If you have any problems seeing, thinking, speaking, walking, or standing, tell your doctor right away. If you cannot reach your doctor, urgently seek other source of medical care. Check with your doctor or health care professional if you get an attack of severe diarrhea, nausea and vomiting, or if you sweat a lot. The loss of too much body fluid can make it dangerous for you to take this medicine. Do not become pregnant while taking this medicine or for at least 7 months after stopping it. Women should inform their doctor   if they wish to become pregnant or think they might be pregnant. Men should not father a child while taking this medicine and for at least 4 months after stopping it. There is a potential for serious side effects to an unborn child. Talk to your health care professional or pharmacist for more information. Do not breast-feed an infant while taking this medicine or for 2  months after stopping it. This medicine may interfere with the ability to have a child. You should talk with your doctor or health care professional if you are concerned about your fertility. What side effects may I notice from receiving this medicine? Side effects that you should report to your doctor or health care professional as soon as possible:  allergic reactions like skin rash, itching or hives, swelling of the face, lips, or tongue  breathing problems  changes in hearing  changes in vision  fast, irregular heartbeat  feeling faint or lightheaded, falls  pain, tingling, numbness in the hands or feet  right upper belly pain  seizures  swelling of the ankles, feet, hands  unusual bleeding or bruising  unusually weak or tired  vomiting  yellowing of the eyes or skin Side effects that usually do not require medical attention (report to your doctor or health care professional if they continue or are bothersome):  changes in emotions or moods  constipation  diarrhea  loss of appetite  headache  irritation at site where injected  nausea This list may not describe all possible side effects. Call your doctor for medical advice about side effects. You may report side effects to FDA at 1-800-FDA-1088. Where should I keep my medicine? This drug is given in a hospital or clinic and will not be stored at home. NOTE: This sheet is a summary. It may not cover all possible information. If you have questions about this medicine, talk to your doctor, pharmacist, or health care provider.  2020 Elsevier/Gold Standard (2018-01-20 16:29:31)  

## 2019-12-11 NOTE — Progress Notes (Signed)
Labs reviewed by MD, Ok to treat despite labs

## 2019-12-17 ENCOUNTER — Other Ambulatory Visit: Payer: Self-pay | Admitting: *Deleted

## 2019-12-17 DIAGNOSIS — C9000 Multiple myeloma not having achieved remission: Secondary | ICD-10-CM

## 2019-12-18 ENCOUNTER — Inpatient Hospital Stay: Payer: Medicare Other

## 2019-12-18 ENCOUNTER — Other Ambulatory Visit: Payer: Self-pay

## 2019-12-18 VITALS — BP 144/63 | HR 86 | Temp 97.5°F | Resp 17

## 2019-12-18 DIAGNOSIS — Z5111 Encounter for antineoplastic chemotherapy: Secondary | ICD-10-CM | POA: Diagnosis not present

## 2019-12-18 DIAGNOSIS — C9000 Multiple myeloma not having achieved remission: Secondary | ICD-10-CM

## 2019-12-18 LAB — CMP (CANCER CENTER ONLY)
ALT: 14 U/L (ref 0–44)
AST: 13 U/L — ABNORMAL LOW (ref 15–41)
Albumin: 3.9 g/dL (ref 3.5–5.0)
Alkaline Phosphatase: 45 U/L (ref 38–126)
Anion gap: 4 — ABNORMAL LOW (ref 5–15)
BUN: 21 mg/dL (ref 8–23)
CO2: 30 mmol/L (ref 22–32)
Calcium: 9.2 mg/dL (ref 8.9–10.3)
Chloride: 108 mmol/L (ref 98–111)
Creatinine: 1.52 mg/dL — ABNORMAL HIGH (ref 0.61–1.24)
GFR, Est AFR Am: 48 mL/min — ABNORMAL LOW (ref >60)
GFR, Estimated: 42 mL/min — ABNORMAL LOW (ref >60)
Glucose, Bld: 98 mg/dL (ref 70–99)
Potassium: 4.5 mmol/L (ref 3.5–5.1)
Sodium: 142 mmol/L (ref 135–145)
Total Bilirubin: 0.8 mg/dL (ref 0.3–1.2)
Total Protein: 5.9 g/dL — ABNORMAL LOW (ref 6.5–8.1)

## 2019-12-18 LAB — CBC WITH DIFFERENTIAL (CANCER CENTER ONLY)
Abs Immature Granulocytes: 0.05 10*3/uL (ref 0.00–0.07)
Basophils Absolute: 0.1 10*3/uL (ref 0.0–0.1)
Basophils Relative: 1 %
Eosinophils Absolute: 0.2 10*3/uL (ref 0.0–0.5)
Eosinophils Relative: 2 %
HCT: 43.7 % (ref 39.0–52.0)
Hemoglobin: 14.1 g/dL (ref 13.0–17.0)
Immature Granulocytes: 1 %
Lymphocytes Relative: 29 %
Lymphs Abs: 2.3 10*3/uL (ref 0.7–4.0)
MCH: 32.4 pg (ref 26.0–34.0)
MCHC: 32.3 g/dL (ref 30.0–36.0)
MCV: 100.5 fL — ABNORMAL HIGH (ref 80.0–100.0)
Monocytes Absolute: 0.8 10*3/uL (ref 0.1–1.0)
Monocytes Relative: 10 %
Neutro Abs: 4.5 10*3/uL (ref 1.7–7.7)
Neutrophils Relative %: 57 %
Platelet Count: 166 10*3/uL (ref 150–400)
RBC: 4.35 MIL/uL (ref 4.22–5.81)
RDW: 12.5 % (ref 11.5–15.5)
WBC Count: 7.9 10*3/uL (ref 4.0–10.5)
nRBC: 0 % (ref 0.0–0.2)

## 2019-12-18 MED ORDER — PROCHLORPERAZINE MALEATE 10 MG PO TABS
ORAL_TABLET | ORAL | Status: AC
  Filled 2019-12-18: qty 1

## 2019-12-18 MED ORDER — PROCHLORPERAZINE MALEATE 10 MG PO TABS
10.0000 mg | ORAL_TABLET | Freq: Once | ORAL | Status: AC
  Administered 2019-12-18: 10 mg via ORAL

## 2019-12-18 MED ORDER — BORTEZOMIB CHEMO SQ INJECTION 3.5 MG (2.5MG/ML)
1.3000 mg/m2 | Freq: Once | INTRAMUSCULAR | Status: AC
  Administered 2019-12-18: 2.25 mg via SUBCUTANEOUS
  Filled 2019-12-18: qty 0.9

## 2019-12-18 MED ORDER — DEXAMETHASONE 4 MG PO TABS
ORAL_TABLET | ORAL | Status: AC
  Filled 2019-12-18: qty 5

## 2019-12-18 MED ORDER — DEXAMETHASONE 4 MG PO TABS
20.0000 mg | ORAL_TABLET | ORAL | Status: DC
  Administered 2019-12-18: 20 mg via ORAL

## 2019-12-18 NOTE — Patient Instructions (Signed)
Bortezomib injection What is this medicine? BORTEZOMIB (bor TEZ oh mib) is a medicine that targets proteins in cancer cells and stops the cancer cells from growing. It is used to treat multiple myeloma and mantle-cell lymphoma. This medicine may be used for other purposes; ask your health care provider or pharmacist if you have questions. COMMON BRAND NAME(S): Velcade What should I tell my health care provider before I take this medicine? They need to know if you have any of these conditions:  diabetes  heart disease  irregular heartbeat  liver disease  on hemodialysis  low blood counts, like low white blood cells, platelets, or hemoglobin  peripheral neuropathy  taking medicine for blood pressure  an unusual or allergic reaction to bortezomib, mannitol, boron, other medicines, foods, dyes, or preservatives  pregnant or trying to get pregnant  breast-feeding How should I use this medicine? This medicine is for injection into a vein or for injection under the skin. It is given by a health care professional in a hospital or clinic setting. Talk to your pediatrician regarding the use of this medicine in children. Special care may be needed. Overdosage: If you think you have taken too much of this medicine contact a poison control center or emergency room at once. NOTE: This medicine is only for you. Do not share this medicine with others. What if I miss a dose? It is important not to miss your dose. Call your doctor or health care professional if you are unable to keep an appointment. What may interact with this medicine? This medicine may interact with the following medications:  ketoconazole  rifampin  ritonavir  St. John's Wort This list may not describe all possible interactions. Give your health care provider a list of all the medicines, herbs, non-prescription drugs, or dietary supplements you use. Also tell them if you smoke, drink alcohol, or use illegal drugs. Some  items may interact with your medicine. What should I watch for while using this medicine? You may get drowsy or dizzy. Do not drive, use machinery, or do anything that needs mental alertness until you know how this medicine affects you. Do not stand or sit up quickly, especially if you are an older patient. This reduces the risk of dizzy or fainting spells. In some cases, you may be given additional medicines to help with side effects. Follow all directions for their use. Call your doctor or health care professional for advice if you get a fever, chills or sore throat, or other symptoms of a cold or flu. Do not treat yourself. This drug decreases your body's ability to fight infections. Try to avoid being around people who are sick. This medicine may increase your risk to bruise or bleed. Call your doctor or health care professional if you notice any unusual bleeding. You may need blood work done while you are taking this medicine. In some patients, this medicine may cause a serious brain infection that may cause death. If you have any problems seeing, thinking, speaking, walking, or standing, tell your doctor right away. If you cannot reach your doctor, urgently seek other source of medical care. Check with your doctor or health care professional if you get an attack of severe diarrhea, nausea and vomiting, or if you sweat a lot. The loss of too much body fluid can make it dangerous for you to take this medicine. Do not become pregnant while taking this medicine or for at least 7 months after stopping it. Women should inform their doctor   if they wish to become pregnant or think they might be pregnant. Men should not father a child while taking this medicine and for at least 4 months after stopping it. There is a potential for serious side effects to an unborn child. Talk to your health care professional or pharmacist for more information. Do not breast-feed an infant while taking this medicine or for 2  months after stopping it. This medicine may interfere with the ability to have a child. You should talk with your doctor or health care professional if you are concerned about your fertility. What side effects may I notice from receiving this medicine? Side effects that you should report to your doctor or health care professional as soon as possible:  allergic reactions like skin rash, itching or hives, swelling of the face, lips, or tongue  breathing problems  changes in hearing  changes in vision  fast, irregular heartbeat  feeling faint or lightheaded, falls  pain, tingling, numbness in the hands or feet  right upper belly pain  seizures  swelling of the ankles, feet, hands  unusual bleeding or bruising  unusually weak or tired  vomiting  yellowing of the eyes or skin Side effects that usually do not require medical attention (report to your doctor or health care professional if they continue or are bothersome):  changes in emotions or moods  constipation  diarrhea  loss of appetite  headache  irritation at site where injected  nausea This list may not describe all possible side effects. Call your doctor for medical advice about side effects. You may report side effects to FDA at 1-800-FDA-1088. Where should I keep my medicine? This drug is given in a hospital or clinic and will not be stored at home. NOTE: This sheet is a summary. It may not cover all possible information. If you have questions about this medicine, talk to your doctor, pharmacist, or health care provider.  2020 Elsevier/Gold Standard (2018-01-20 16:29:31)  

## 2019-12-18 NOTE — Progress Notes (Signed)
OK to treat with creat-1.52 per order of Dr. Marin Olp.

## 2019-12-25 NOTE — Progress Notes (Signed)
Pharmacist Chemotherapy Monitoring - Follow Up Assessment    I verify that I have reviewed each item in the below checklist:  . Regimen for the patient is scheduled for the appropriate day and plan matches scheduled date. Marland Kitchen Appropriate non-routine labs are ordered dependent on drug ordered. . If applicable, additional medications reviewed and ordered per protocol based on lifetime cumulative doses and/or treatment regimen.   Plan for follow-up and/or issues identified: No . I-vent associated with next due treatment: No . MD and/or nursing notified: No  Shawn Bauer, Jacqlyn Larsen 12/25/2019 7:57 AM

## 2020-01-01 ENCOUNTER — Inpatient Hospital Stay (HOSPITAL_BASED_OUTPATIENT_CLINIC_OR_DEPARTMENT_OTHER): Payer: Medicare Other | Admitting: Hematology & Oncology

## 2020-01-01 ENCOUNTER — Other Ambulatory Visit: Payer: Self-pay

## 2020-01-01 ENCOUNTER — Encounter: Payer: Self-pay | Admitting: Hematology & Oncology

## 2020-01-01 ENCOUNTER — Inpatient Hospital Stay: Payer: Medicare Other | Attending: Family

## 2020-01-01 ENCOUNTER — Inpatient Hospital Stay: Payer: Medicare Other

## 2020-01-01 VITALS — BP 121/69 | HR 83 | Temp 97.1°F | Resp 17 | Ht 69.5 in | Wt 138.0 lb

## 2020-01-01 DIAGNOSIS — Z79899 Other long term (current) drug therapy: Secondary | ICD-10-CM | POA: Diagnosis not present

## 2020-01-01 DIAGNOSIS — I25119 Atherosclerotic heart disease of native coronary artery with unspecified angina pectoris: Secondary | ICD-10-CM

## 2020-01-01 DIAGNOSIS — N189 Chronic kidney disease, unspecified: Secondary | ICD-10-CM | POA: Diagnosis not present

## 2020-01-01 DIAGNOSIS — Z7951 Long term (current) use of inhaled steroids: Secondary | ICD-10-CM | POA: Insufficient documentation

## 2020-01-01 DIAGNOSIS — C9000 Multiple myeloma not having achieved remission: Secondary | ICD-10-CM

## 2020-01-01 DIAGNOSIS — Z7982 Long term (current) use of aspirin: Secondary | ICD-10-CM | POA: Insufficient documentation

## 2020-01-01 DIAGNOSIS — Z5111 Encounter for antineoplastic chemotherapy: Secondary | ICD-10-CM | POA: Diagnosis not present

## 2020-01-01 LAB — CBC WITH DIFFERENTIAL (CANCER CENTER ONLY)
Abs Immature Granulocytes: 0.03 10*3/uL (ref 0.00–0.07)
Basophils Absolute: 0.1 10*3/uL (ref 0.0–0.1)
Basophils Relative: 1 %
Eosinophils Absolute: 0.1 10*3/uL (ref 0.0–0.5)
Eosinophils Relative: 1 %
HCT: 44.2 % (ref 39.0–52.0)
Hemoglobin: 14.5 g/dL (ref 13.0–17.0)
Immature Granulocytes: 0 %
Lymphocytes Relative: 31 %
Lymphs Abs: 2.4 10*3/uL (ref 0.7–4.0)
MCH: 32.7 pg (ref 26.0–34.0)
MCHC: 32.8 g/dL (ref 30.0–36.0)
MCV: 99.8 fL (ref 80.0–100.0)
Monocytes Absolute: 0.8 10*3/uL (ref 0.1–1.0)
Monocytes Relative: 11 %
Neutro Abs: 4.4 10*3/uL (ref 1.7–7.7)
Neutrophils Relative %: 56 %
Platelet Count: 220 10*3/uL (ref 150–400)
RBC: 4.43 MIL/uL (ref 4.22–5.81)
RDW: 12.6 % (ref 11.5–15.5)
WBC Count: 7.8 10*3/uL (ref 4.0–10.5)
nRBC: 0 % (ref 0.0–0.2)

## 2020-01-01 LAB — CMP (CANCER CENTER ONLY)
ALT: 12 U/L (ref 0–44)
AST: 13 U/L — ABNORMAL LOW (ref 15–41)
Albumin: 4 g/dL (ref 3.5–5.0)
Alkaline Phosphatase: 41 U/L (ref 38–126)
Anion gap: 7 (ref 5–15)
BUN: 24 mg/dL — ABNORMAL HIGH (ref 8–23)
CO2: 31 mmol/L (ref 22–32)
Calcium: 9.5 mg/dL (ref 8.9–10.3)
Chloride: 105 mmol/L (ref 98–111)
Creatinine: 1.69 mg/dL — ABNORMAL HIGH (ref 0.61–1.24)
GFR, Est AFR Am: 43 mL/min — ABNORMAL LOW (ref >60)
GFR, Estimated: 37 mL/min — ABNORMAL LOW (ref >60)
Glucose, Bld: 106 mg/dL — ABNORMAL HIGH (ref 70–99)
Potassium: 4.1 mmol/L (ref 3.5–5.1)
Sodium: 143 mmol/L (ref 135–145)
Total Bilirubin: 1 mg/dL (ref 0.3–1.2)
Total Protein: 6 g/dL — ABNORMAL LOW (ref 6.5–8.1)

## 2020-01-01 MED ORDER — BORTEZOMIB CHEMO SQ INJECTION 3.5 MG (2.5MG/ML)
1.3000 mg/m2 | Freq: Once | INTRAMUSCULAR | Status: AC
  Administered 2020-01-01: 2.25 mg via SUBCUTANEOUS
  Filled 2020-01-01: qty 0.9

## 2020-01-01 MED ORDER — PROCHLORPERAZINE MALEATE 10 MG PO TABS
ORAL_TABLET | ORAL | Status: AC
  Filled 2020-01-01: qty 1

## 2020-01-01 MED ORDER — DEXAMETHASONE 4 MG PO TABS
20.0000 mg | ORAL_TABLET | ORAL | Status: DC
  Administered 2020-01-01: 20 mg via ORAL

## 2020-01-01 MED ORDER — PROCHLORPERAZINE MALEATE 10 MG PO TABS
10.0000 mg | ORAL_TABLET | Freq: Once | ORAL | Status: AC
  Administered 2020-01-01: 10 mg via ORAL

## 2020-01-01 MED ORDER — DEXAMETHASONE 4 MG PO TABS
ORAL_TABLET | ORAL | Status: AC
  Filled 2020-01-01: qty 5

## 2020-01-01 NOTE — Patient Instructions (Signed)
Bortezomib injection What is this medicine? BORTEZOMIB (bor TEZ oh mib) is a medicine that targets proteins in cancer cells and stops the cancer cells from growing. It is used to treat multiple myeloma and mantle-cell lymphoma. This medicine may be used for other purposes; ask your health care provider or pharmacist if you have questions. COMMON BRAND NAME(S): Velcade What should I tell my health care provider before I take this medicine? They need to know if you have any of these conditions:  diabetes  heart disease  irregular heartbeat  liver disease  on hemodialysis  low blood counts, like low white blood cells, platelets, or hemoglobin  peripheral neuropathy  taking medicine for blood pressure  an unusual or allergic reaction to bortezomib, mannitol, boron, other medicines, foods, dyes, or preservatives  pregnant or trying to get pregnant  breast-feeding How should I use this medicine? This medicine is for injection into a vein or for injection under the skin. It is given by a health care professional in a hospital or clinic setting. Talk to your pediatrician regarding the use of this medicine in children. Special care may be needed. Overdosage: If you think you have taken too much of this medicine contact a poison control center or emergency room at once. NOTE: This medicine is only for you. Do not share this medicine with others. What if I miss a dose? It is important not to miss your dose. Call your doctor or health care professional if you are unable to keep an appointment. What may interact with this medicine? This medicine may interact with the following medications:  ketoconazole  rifampin  ritonavir  St. John's Wort This list may not describe all possible interactions. Give your health care provider a list of all the medicines, herbs, non-prescription drugs, or dietary supplements you use. Also tell them if you smoke, drink alcohol, or use illegal drugs. Some  items may interact with your medicine. What should I watch for while using this medicine? You may get drowsy or dizzy. Do not drive, use machinery, or do anything that needs mental alertness until you know how this medicine affects you. Do not stand or sit up quickly, especially if you are an older patient. This reduces the risk of dizzy or fainting spells. In some cases, you may be given additional medicines to help with side effects. Follow all directions for their use. Call your doctor or health care professional for advice if you get a fever, chills or sore throat, or other symptoms of a cold or flu. Do not treat yourself. This drug decreases your body's ability to fight infections. Try to avoid being around people who are sick. This medicine may increase your risk to bruise or bleed. Call your doctor or health care professional if you notice any unusual bleeding. You may need blood work done while you are taking this medicine. In some patients, this medicine may cause a serious brain infection that may cause death. If you have any problems seeing, thinking, speaking, walking, or standing, tell your doctor right away. If you cannot reach your doctor, urgently seek other source of medical care. Check with your doctor or health care professional if you get an attack of severe diarrhea, nausea and vomiting, or if you sweat a lot. The loss of too much body fluid can make it dangerous for you to take this medicine. Do not become pregnant while taking this medicine or for at least 7 months after stopping it. Women should inform their doctor   if they wish to become pregnant or think they might be pregnant. Men should not father a child while taking this medicine and for at least 4 months after stopping it. There is a potential for serious side effects to an unborn child. Talk to your health care professional or pharmacist for more information. Do not breast-feed an infant while taking this medicine or for 2  months after stopping it. This medicine may interfere with the ability to have a child. You should talk with your doctor or health care professional if you are concerned about your fertility. What side effects may I notice from receiving this medicine? Side effects that you should report to your doctor or health care professional as soon as possible:  allergic reactions like skin rash, itching or hives, swelling of the face, lips, or tongue  breathing problems  changes in hearing  changes in vision  fast, irregular heartbeat  feeling faint or lightheaded, falls  pain, tingling, numbness in the hands or feet  right upper belly pain  seizures  swelling of the ankles, feet, hands  unusual bleeding or bruising  unusually weak or tired  vomiting  yellowing of the eyes or skin Side effects that usually do not require medical attention (report to your doctor or health care professional if they continue or are bothersome):  changes in emotions or moods  constipation  diarrhea  loss of appetite  headache  irritation at site where injected  nausea This list may not describe all possible side effects. Call your doctor for medical advice about side effects. You may report side effects to FDA at 1-800-FDA-1088. Where should I keep my medicine? This drug is given in a hospital or clinic and will not be stored at home. NOTE: This sheet is a summary. It may not cover all possible information. If you have questions about this medicine, talk to your doctor, pharmacist, or health care provider.  2020 Elsevier/Gold Standard (2018-01-20 16:29:31)  

## 2020-01-01 NOTE — Progress Notes (Signed)
Hematology and Oncology Follow Up Visit  Shawn Bauer [facility] [id number] 01-07-1934 86 y.o. 01/01/2020   Principle Diagnosis:  IgG Kappa myeloma -- high risk due to t(4:21)  Current Therapy:   Velcade/Decadron -- started 10/09/2019, s/p cycle #3   Interim History:  Shawn Bauer is here today for follow-up and treatment.  Overall, he is doing quite well.  He is tolerated the treatment nicely so far.  He has responded as I expected.  There is no monoclonal spike in his blood.  His last IgG level was 706 mg/dL.  His kappa light chain was down to 24 mg/dL.  He has had no issues with nausea or vomiting.  He has had no infections.  He has had no cough or shortness of breath.  Has had no leg swelling.  He hopes to go [place] hunting this weekend.  He has had no bleeding.  Overall, I would say performance status is ECOG 1.    Medications:  Allergies as of 01/01/2020   No Known Allergies     Medication List       Accurate as of January 01, 2020  8:42 AM. If you have any questions, ask your nurse or doctor.        aspirin 81 MG tablet Take 81 mg by mouth daily.   budesonide-formoterol 160-4.5 MCG/ACT inhaler Commonly known as: SYMBICORT Inhale 2 puffs into the lungs 2 (two) times daily.   cyanocobalamin 1000 MCG tablet Take 1,000 mcg by mouth daily.   famciclovir 250 MG tablet Commonly known as: FAMVIR Take 1 tablet (250 mg total) by mouth daily.   lovastatin 40 MG tablet Commonly known as: MEVACOR Take 2 tablets once a day   metoprolol succinate 50 MG 24 hr tablet Commonly known as: TOPROL-XL Take 50 mg by mouth daily. Patient taking 1/2 tab in the morning and 1/2 tab in the evening. Total 50 mg per day. Take with or immediately following a meal.   nitroGLYCERIN 0.4 MG SL tablet Commonly known as: NITROSTAT Place 1 tablet (0.4 mg total) under the tongue every 5 (five) minutes as needed for chest pain.   olmesartan 20 MG tablet Commonly known as: BENICAR Take 20 mg by mouth  daily.   traMADol 50 MG tablet Commonly known as: ULTRAM Take 50 mg by mouth 2 (two) times daily as needed.       Allergies: No Known Allergies  Past Medical History, Surgical history, Social history, and Family History were reviewed and updated.  Review of Systems: Review of Systems  Constitutional: Negative.   HENT: Negative.   Eyes: Negative.   Respiratory: Negative.   Cardiovascular: Negative.   Gastrointestinal: Negative.   Genitourinary: Negative.   Musculoskeletal: Negative.   Skin: Negative.   Neurological: Negative.   Endo/Heme/Allergies: Negative.   Psychiatric/Behavioral: Negative.      Physical Exam:  weight is 138 lb (62.6 kg). His temporal temperature is 97.1 F (36.2 C) (abnormal). His blood pressure is 121/69 and his pulse is 83. His respiration is 17 and oxygen saturation is 99%.   Wt Readings from Last 3 Encounters:  01/01/20 138 lb (62.6 kg)  12/11/19 139 lb (63 kg)  12/04/19 139 lb (63 kg)   Physical Exam Vitals reviewed.  HENT:     Head: Normocephalic and atraumatic.  Eyes:     Pupils: Pupils are equal, round, and reactive to light.  Cardiovascular:     Rate and Rhythm: Normal rate and regular rhythm.     Heart sounds:  Normal heart sounds.  Pulmonary:     Effort: Pulmonary effort is normal.     Breath sounds: Normal breath sounds.  Abdominal:     General: Bowel sounds are normal.     Palpations: Abdomen is soft.  Musculoskeletal:        General: No tenderness or deformity. Normal range of motion.     Cervical back: Normal range of motion.  Lymphadenopathy:     Cervical: No cervical adenopathy.  Skin:    General: Skin is warm and dry.     Findings: No erythema or rash.  Neurological:     Mental Status: He is alert and oriented to person, place, and time.  Psychiatric:        Behavior: Behavior normal.        Thought Content: Thought content normal.        Judgment: Judgment normal.      Lab Results  Component Value Date    WBC 7.8 01/01/2020   HGB 14.5 01/01/2020   HCT 44.2 01/01/2020   MCV 99.8 01/01/2020   PLT 220 01/01/2020   No results found for: FERRITIN, IRON, TIBC, UIBC, IRONPCTSAT Lab Results  Component Value Date   RBC 4.43 01/01/2020   Lab Results  Component Value Date   KPAFRELGTCHN 236.3 (H) 12/04/2019   LAMBDASER 10.6 12/04/2019   KAPLAMBRATIO 22.29 (H) 12/04/2019   Lab Results  Component Value Date   IGGSERUM 706 12/04/2019   IGA 113 12/04/2019   IGMSERUM 22 12/04/2019   Lab Results  Component Value Date   TOTALPROTELP 6.0 12/04/2019   ALBUMINELP 3.6 12/04/2019   A1GS 0.2 12/04/2019   A2GS 0.8 12/04/2019   BETS 0.7 12/04/2019   GAMS 0.6 12/04/2019   MSPIKE Not Observed 12/04/2019   SPEI Comment 09/01/2019     Chemistry      Component Value Date/Time   NA 143 01/01/2020 0802   K 4.1 01/01/2020 0802   CL 105 01/01/2020 0802   CO2 31 01/01/2020 0802   BUN 24 (H) 01/01/2020 0802   CREATININE 1.69 (H) 01/01/2020 0802      Component Value Date/Time   CALCIUM 9.5 01/01/2020 0802   ALKPHOS 41 01/01/2020 0802   AST 13 (L) 01/01/2020 0802   ALT 12 01/01/2020 0802   BILITOT 1.0 01/01/2020 0802       Impression and Plan: Shawn Bauer is a very pleasant 86 yo caucasian gentleman with IgG kappa myeloma.  His big problem has been the kappa light chain being so elevated.  He has some chronic renal insufficiency which may or may not be from the light chain.  Again, his light chains are coming down.  We will see if they are today.  We will go ahead with this fourth cycle of treatment.  He gets the Velcade 3 weeks on and 1 week off.  We will have him plan to come back in another 4 weeks.    [name], MD 4/9/20218:42 AM

## 2020-01-01 NOTE — Progress Notes (Signed)
Pharmacist Chemotherapy Monitoring - Follow Up Assessment    I verify that I have reviewed each item in the below checklist:  . Regimen for the patient is scheduled for the appropriate day and plan matches scheduled date. Marland Kitchen Appropriate non-routine labs are ordered dependent on drug ordered. . If applicable, additional medications reviewed and ordered per protocol based on lifetime cumulative doses and/or treatment regimen.   Plan for follow-up and/or issues identified: No . I-vent associated with next due treatment: No . MD and/or nursing notified: No  Shawn Bauer, Shawn Bauer 01/01/2020 7:52 AM

## 2020-01-01 NOTE — Progress Notes (Signed)
Labs reviewed by MD, ok to treat despite counts. 

## 2020-01-03 LAB — IGG, IGA, IGM
IgA: 118 mg/dL (ref 61–437)
IgG (Immunoglobin G), Serum: 701 mg/dL (ref 603–1613)
IgM (Immunoglobulin M), Srm: 21 mg/dL (ref 15–143)

## 2020-01-04 LAB — PROTEIN ELECTROPHORESIS, SERUM
A/G Ratio: 1.4 (ref 0.7–1.7)
Albumin ELP: 3.4 g/dL (ref 2.9–4.4)
Alpha-1-Globulin: 0.3 g/dL (ref 0.0–0.4)
Alpha-2-Globulin: 0.7 g/dL (ref 0.4–1.0)
Beta Globulin: 0.9 g/dL (ref 0.7–1.3)
Gamma Globulin: 0.7 g/dL (ref 0.4–1.8)
Globulin, Total: 2.5 g/dL (ref 2.2–3.9)
Total Protein ELP: 5.9 g/dL — ABNORMAL LOW (ref 6.0–8.5)

## 2020-01-04 LAB — KAPPA/LAMBDA LIGHT CHAINS
Kappa free light chain: 316.2 mg/L — ABNORMAL HIGH (ref 3.3–19.4)
Kappa, lambda light chain ratio: 33.64 — ABNORMAL HIGH (ref 0.26–1.65)
Lambda free light chains: 9.4 mg/L (ref 5.7–26.3)

## 2020-01-07 ENCOUNTER — Other Ambulatory Visit: Payer: Self-pay

## 2020-01-07 DIAGNOSIS — C9000 Multiple myeloma not having achieved remission: Secondary | ICD-10-CM

## 2020-01-08 ENCOUNTER — Inpatient Hospital Stay: Payer: Medicare Other

## 2020-01-08 ENCOUNTER — Other Ambulatory Visit: Payer: Self-pay

## 2020-01-08 VITALS — BP 136/55 | HR 74 | Temp 97.5°F | Resp 16

## 2020-01-08 DIAGNOSIS — Z5111 Encounter for antineoplastic chemotherapy: Secondary | ICD-10-CM | POA: Diagnosis not present

## 2020-01-08 DIAGNOSIS — C9000 Multiple myeloma not having achieved remission: Secondary | ICD-10-CM

## 2020-01-08 LAB — CBC WITH DIFFERENTIAL (CANCER CENTER ONLY)
Abs Immature Granulocytes: 0.04 10*3/uL (ref 0.00–0.07)
Basophils Absolute: 0.1 10*3/uL (ref 0.0–0.1)
Basophils Relative: 1 %
Eosinophils Absolute: 0.5 10*3/uL (ref 0.0–0.5)
Eosinophils Relative: 7 %
HCT: 42.2 % (ref 39.0–52.0)
Hemoglobin: 13.8 g/dL (ref 13.0–17.0)
Immature Granulocytes: 1 %
Lymphocytes Relative: 29 %
Lymphs Abs: 2.1 10*3/uL (ref 0.7–4.0)
MCH: 32.5 pg (ref 26.0–34.0)
MCHC: 32.7 g/dL (ref 30.0–36.0)
MCV: 99.3 fL (ref 80.0–100.0)
Monocytes Absolute: 0.9 10*3/uL (ref 0.1–1.0)
Monocytes Relative: 12 %
Neutro Abs: 3.7 10*3/uL (ref 1.7–7.7)
Neutrophils Relative %: 50 %
Platelet Count: 173 10*3/uL (ref 150–400)
RBC: 4.25 MIL/uL (ref 4.22–5.81)
RDW: 12.5 % (ref 11.5–15.5)
WBC Count: 7.2 10*3/uL (ref 4.0–10.5)
nRBC: 0 % (ref 0.0–0.2)

## 2020-01-08 LAB — CMP (CANCER CENTER ONLY)
ALT: 13 U/L (ref 0–44)
AST: 14 U/L — ABNORMAL LOW (ref 15–41)
Albumin: 3.7 g/dL (ref 3.5–5.0)
Alkaline Phosphatase: 36 U/L — ABNORMAL LOW (ref 38–126)
Anion gap: 3 — ABNORMAL LOW (ref 5–15)
BUN: 20 mg/dL (ref 8–23)
CO2: 31 mmol/L (ref 22–32)
Calcium: 9.1 mg/dL (ref 8.9–10.3)
Chloride: 106 mmol/L (ref 98–111)
Creatinine: 1.63 mg/dL — ABNORMAL HIGH (ref 0.61–1.24)
GFR, Est AFR Am: 44 mL/min — ABNORMAL LOW (ref >60)
GFR, Estimated: 38 mL/min — ABNORMAL LOW (ref >60)
Glucose, Bld: 109 mg/dL — ABNORMAL HIGH (ref 70–99)
Potassium: 4.8 mmol/L (ref 3.5–5.1)
Sodium: 140 mmol/L (ref 135–145)
Total Bilirubin: 0.9 mg/dL (ref 0.3–1.2)
Total Protein: 5.4 g/dL — ABNORMAL LOW (ref 6.5–8.1)

## 2020-01-08 MED ORDER — DEXAMETHASONE 4 MG PO TABS
20.0000 mg | ORAL_TABLET | ORAL | Status: DC
  Administered 2020-01-08: 20 mg via ORAL

## 2020-01-08 MED ORDER — DENOSUMAB 120 MG/1.7ML ~~LOC~~ SOLN
120.0000 mg | Freq: Once | SUBCUTANEOUS | Status: AC
  Administered 2020-01-08: 120 mg via SUBCUTANEOUS

## 2020-01-08 MED ORDER — DEXAMETHASONE 4 MG PO TABS
ORAL_TABLET | ORAL | Status: AC
  Filled 2020-01-08: qty 5

## 2020-01-08 MED ORDER — BORTEZOMIB CHEMO SQ INJECTION 3.5 MG (2.5MG/ML)
1.3000 mg/m2 | Freq: Once | INTRAMUSCULAR | Status: AC
  Administered 2020-01-08: 2.25 mg via SUBCUTANEOUS
  Filled 2020-01-08: qty 0.9

## 2020-01-08 MED ORDER — PROCHLORPERAZINE MALEATE 10 MG PO TABS
10.0000 mg | ORAL_TABLET | Freq: Once | ORAL | Status: AC
  Administered 2020-01-08: 10 mg via ORAL

## 2020-01-08 MED ORDER — PROCHLORPERAZINE MALEATE 10 MG PO TABS
ORAL_TABLET | ORAL | Status: AC
  Filled 2020-01-08: qty 1

## 2020-01-08 MED ORDER — DENOSUMAB 120 MG/1.7ML ~~LOC~~ SOLN
SUBCUTANEOUS | Status: AC
  Filled 2020-01-08: qty 1.7

## 2020-01-08 NOTE — Progress Notes (Signed)
Ok to treat with creatinine of 1.63 per Dr Ennever. dph 

## 2020-01-08 NOTE — Patient Instructions (Signed)
Bortezomib injection What is this medicine? BORTEZOMIB (bor TEZ oh mib) is a medicine that targets proteins in cancer cells and stops the cancer cells from growing. It is used to treat multiple myeloma and mantle-cell lymphoma. This medicine may be used for other purposes; ask your health care provider or pharmacist if you have questions. COMMON BRAND NAME(S): Velcade What should I tell my health care provider before I take this medicine? They need to know if you have any of these conditions:  diabetes  heart disease  irregular heartbeat  liver disease  on hemodialysis  low blood counts, like low white blood cells, platelets, or hemoglobin  peripheral neuropathy  taking medicine for blood pressure  an unusual or allergic reaction to bortezomib, mannitol, boron, other medicines, foods, dyes, or preservatives  pregnant or trying to get pregnant  breast-feeding How should I use this medicine? This medicine is for injection into a vein or for injection under the skin. It is given by a health care professional in a hospital or clinic setting. Talk to your pediatrician regarding the use of this medicine in children. Special care may be needed. Overdosage: If you think you have taken too much of this medicine contact a poison control center or emergency room at once. NOTE: This medicine is only for you. Do not share this medicine with others. What if I miss a dose? It is important not to miss your dose. Call your doctor or health care professional if you are unable to keep an appointment. What may interact with this medicine? This medicine may interact with the following medications:  ketoconazole  rifampin  ritonavir  St. John's Wort This list may not describe all possible interactions. Give your health care provider a list of all the medicines, herbs, non-prescription drugs, or dietary supplements you use. Also tell them if you smoke, drink alcohol, or use illegal drugs. Some  items may interact with your medicine. What should I watch for while using this medicine? You may get drowsy or dizzy. Do not drive, use machinery, or do anything that needs mental alertness until you know how this medicine affects you. Do not stand or sit up quickly, especially if you are an older patient. This reduces the risk of dizzy or fainting spells. In some cases, you may be given additional medicines to help with side effects. Follow all directions for their use. Call your doctor or health care professional for advice if you get a fever, chills or sore throat, or other symptoms of a cold or flu. Do not treat yourself. This drug decreases your body's ability to fight infections. Try to avoid being around people who are sick. This medicine may increase your risk to bruise or bleed. Call your doctor or health care professional if you notice any unusual bleeding. You may need blood work done while you are taking this medicine. In some patients, this medicine may cause a serious brain infection that may cause death. If you have any problems seeing, thinking, speaking, walking, or standing, tell your doctor right away. If you cannot reach your doctor, urgently seek other source of medical care. Check with your doctor or health care professional if you get an attack of severe diarrhea, nausea and vomiting, or if you sweat a lot. The loss of too much body fluid can make it dangerous for you to take this medicine. Do not become pregnant while taking this medicine or for at least 7 months after stopping it. Women should inform their doctor   if they wish to become pregnant or think they might be pregnant. Men should not father a child while taking this medicine and for at least 4 months after stopping it. There is a potential for serious side effects to an unborn child. Talk to your health care professional or pharmacist for more information. Do not breast-feed an infant while taking this medicine or for 2  months after stopping it. This medicine may interfere with the ability to have a child. You should talk with your doctor or health care professional if you are concerned about your fertility. What side effects may I notice from receiving this medicine? Side effects that you should report to your doctor or health care professional as soon as possible:  allergic reactions like skin rash, itching or hives, swelling of the face, lips, or tongue  breathing problems  changes in hearing  changes in vision  fast, irregular heartbeat  feeling faint or lightheaded, falls  pain, tingling, numbness in the hands or feet  right upper belly pain  seizures  swelling of the ankles, feet, hands  unusual bleeding or bruising  unusually weak or tired  vomiting  yellowing of the eyes or skin Side effects that usually do not require medical attention (report to your doctor or health care professional if they continue or are bothersome):  changes in emotions or moods  constipation  diarrhea  loss of appetite  headache  irritation at site where injected  nausea This list may not describe all possible side effects. Call your doctor for medical advice about side effects. You may report side effects to FDA at 1-800-FDA-1088. Where should I keep my medicine? This drug is given in a hospital or clinic and will not be stored at home. NOTE: This sheet is a summary. It may not cover all possible information. If you have questions about this medicine, talk to your doctor, pharmacist, or health care provider.  2020 Elsevier/Gold Standard (2018-01-20 16:29:31)  

## 2020-01-08 NOTE — Progress Notes (Signed)
Pharmacist Chemotherapy Monitoring - Follow Up Assessment    I verify that I have reviewed each item in the below checklist:  . Regimen for the patient is scheduled for the appropriate day and plan matches scheduled date. Marland Kitchen Appropriate non-routine labs are ordered dependent on drug ordered. . If applicable, additional medications reviewed and ordered per protocol based on lifetime cumulative doses and/or treatment regimen.   Plan for follow-up and/or issues identified: No . I-vent associated with next due treatment: No . MD and/or nursing notified: No  Raksha Wolfgang, Jacqlyn Larsen 01/08/2020 7:50 AM

## 2020-01-14 ENCOUNTER — Other Ambulatory Visit: Payer: Self-pay | Admitting: *Deleted

## 2020-01-14 DIAGNOSIS — C9000 Multiple myeloma not having achieved remission: Secondary | ICD-10-CM

## 2020-01-15 ENCOUNTER — Inpatient Hospital Stay: Payer: Medicare Other

## 2020-01-15 ENCOUNTER — Other Ambulatory Visit: Payer: Self-pay

## 2020-01-15 VITALS — BP 135/61 | HR 73 | Temp 97.1°F | Resp 17

## 2020-01-15 DIAGNOSIS — C9000 Multiple myeloma not having achieved remission: Secondary | ICD-10-CM

## 2020-01-15 DIAGNOSIS — Z5111 Encounter for antineoplastic chemotherapy: Secondary | ICD-10-CM | POA: Diagnosis not present

## 2020-01-15 LAB — CMP (CANCER CENTER ONLY)
ALT: 16 U/L (ref 0–44)
AST: 16 U/L (ref 15–41)
Albumin: 3.7 g/dL (ref 3.5–5.0)
Alkaline Phosphatase: 40 U/L (ref 38–126)
Anion gap: 4 — ABNORMAL LOW (ref 5–15)
BUN: 18 mg/dL (ref 8–23)
CO2: 30 mmol/L (ref 22–32)
Calcium: 9.1 mg/dL (ref 8.9–10.3)
Chloride: 107 mmol/L (ref 98–111)
Creatinine: 1.79 mg/dL — ABNORMAL HIGH (ref 0.61–1.24)
GFR, Est AFR Am: 40 mL/min — ABNORMAL LOW (ref >60)
GFR, Estimated: 34 mL/min — ABNORMAL LOW (ref >60)
Glucose, Bld: 97 mg/dL (ref 70–99)
Potassium: 4.5 mmol/L (ref 3.5–5.1)
Sodium: 141 mmol/L (ref 135–145)
Total Bilirubin: 0.8 mg/dL (ref 0.3–1.2)
Total Protein: 5.7 g/dL — ABNORMAL LOW (ref 6.5–8.1)

## 2020-01-15 LAB — CBC WITH DIFFERENTIAL (CANCER CENTER ONLY)
Abs Immature Granulocytes: 0.06 10*3/uL (ref 0.00–0.07)
Basophils Absolute: 0.1 10*3/uL (ref 0.0–0.1)
Basophils Relative: 1 %
Eosinophils Absolute: 0.4 10*3/uL (ref 0.0–0.5)
Eosinophils Relative: 5 %
HCT: 41.7 % (ref 39.0–52.0)
Hemoglobin: 13.7 g/dL (ref 13.0–17.0)
Immature Granulocytes: 1 %
Lymphocytes Relative: 28 %
Lymphs Abs: 2.4 10*3/uL (ref 0.7–4.0)
MCH: 32.5 pg (ref 26.0–34.0)
MCHC: 32.9 g/dL (ref 30.0–36.0)
MCV: 98.8 fL (ref 80.0–100.0)
Monocytes Absolute: 0.8 10*3/uL (ref 0.1–1.0)
Monocytes Relative: 10 %
Neutro Abs: 4.6 10*3/uL (ref 1.7–7.7)
Neutrophils Relative %: 55 %
Platelet Count: 156 10*3/uL (ref 150–400)
RBC: 4.22 MIL/uL (ref 4.22–5.81)
RDW: 12.7 % (ref 11.5–15.5)
WBC Count: 8.3 10*3/uL (ref 4.0–10.5)
nRBC: 0 % (ref 0.0–0.2)

## 2020-01-15 MED ORDER — BORTEZOMIB CHEMO SQ INJECTION 3.5 MG (2.5MG/ML)
1.3000 mg/m2 | Freq: Once | INTRAMUSCULAR | Status: AC
  Administered 2020-01-15: 2.25 mg via SUBCUTANEOUS
  Filled 2020-01-15: qty 0.9

## 2020-01-15 MED ORDER — DEXAMETHASONE 4 MG PO TABS
ORAL_TABLET | ORAL | Status: AC
  Filled 2020-01-15: qty 5

## 2020-01-15 MED ORDER — PROCHLORPERAZINE MALEATE 10 MG PO TABS
10.0000 mg | ORAL_TABLET | Freq: Once | ORAL | Status: AC
  Administered 2020-01-15: 10 mg via ORAL

## 2020-01-15 MED ORDER — PROCHLORPERAZINE MALEATE 10 MG PO TABS
ORAL_TABLET | ORAL | Status: AC
  Filled 2020-01-15: qty 1

## 2020-01-15 MED ORDER — DEXAMETHASONE 4 MG PO TABS
20.0000 mg | ORAL_TABLET | ORAL | Status: DC
  Administered 2020-01-15: 20 mg via ORAL

## 2020-01-15 NOTE — Patient Instructions (Signed)
Bortezomib injection What is this medicine? BORTEZOMIB (bor TEZ oh mib) is a medicine that targets proteins in cancer cells and stops the cancer cells from growing. It is used to treat multiple myeloma and mantle-cell lymphoma. This medicine may be used for other purposes; ask your health care provider or pharmacist if you have questions. COMMON BRAND NAME(S): Velcade What should I tell my health care provider before I take this medicine? They need to know if you have any of these conditions:  diabetes  heart disease  irregular heartbeat  liver disease  on hemodialysis  low blood counts, like low white blood cells, platelets, or hemoglobin  peripheral neuropathy  taking medicine for blood pressure  an unusual or allergic reaction to bortezomib, mannitol, boron, other medicines, foods, dyes, or preservatives  pregnant or trying to get pregnant  breast-feeding How should I use this medicine? This medicine is for injection into a vein or for injection under the skin. It is given by a health care professional in a hospital or clinic setting. Talk to your pediatrician regarding the use of this medicine in children. Special care may be needed. Overdosage: If you think you have taken too much of this medicine contact a poison control center or emergency room at once. NOTE: This medicine is only for you. Do not share this medicine with others. What if I miss a dose? It is important not to miss your dose. Call your doctor or health care professional if you are unable to keep an appointment. What may interact with this medicine? This medicine may interact with the following medications:  ketoconazole  rifampin  ritonavir  St. John's Wort This list may not describe all possible interactions. Give your health care provider a list of all the medicines, herbs, non-prescription drugs, or dietary supplements you use. Also tell them if you smoke, drink alcohol, or use illegal drugs. Some  items may interact with your medicine. What should I watch for while using this medicine? You may get drowsy or dizzy. Do not drive, use machinery, or do anything that needs mental alertness until you know how this medicine affects you. Do not stand or sit up quickly, especially if you are an older patient. This reduces the risk of dizzy or fainting spells. In some cases, you may be given additional medicines to help with side effects. Follow all directions for their use. Call your doctor or health care professional for advice if you get a fever, chills or sore throat, or other symptoms of a cold or flu. Do not treat yourself. This drug decreases your body's ability to fight infections. Try to avoid being around people who are sick. This medicine may increase your risk to bruise or bleed. Call your doctor or health care professional if you notice any unusual bleeding. You may need blood work done while you are taking this medicine. In some patients, this medicine may cause a serious brain infection that may cause death. If you have any problems seeing, thinking, speaking, walking, or standing, tell your doctor right away. If you cannot reach your doctor, urgently seek other source of medical care. Check with your doctor or health care professional if you get an attack of severe diarrhea, nausea and vomiting, or if you sweat a lot. The loss of too much body fluid can make it dangerous for you to take this medicine. Do not become pregnant while taking this medicine or for at least 7 months after stopping it. Women should inform their doctor   if they wish to become pregnant or think they might be pregnant. Men should not father a child while taking this medicine and for at least 4 months after stopping it. There is a potential for serious side effects to an unborn child. Talk to your health care professional or pharmacist for more information. Do not breast-feed an infant while taking this medicine or for 2  months after stopping it. This medicine may interfere with the ability to have a child. You should talk with your doctor or health care professional if you are concerned about your fertility. What side effects may I notice from receiving this medicine? Side effects that you should report to your doctor or health care professional as soon as possible:  allergic reactions like skin rash, itching or hives, swelling of the face, lips, or tongue  breathing problems  changes in hearing  changes in vision  fast, irregular heartbeat  feeling faint or lightheaded, falls  pain, tingling, numbness in the hands or feet  right upper belly pain  seizures  swelling of the ankles, feet, hands  unusual bleeding or bruising  unusually weak or tired  vomiting  yellowing of the eyes or skin Side effects that usually do not require medical attention (report to your doctor or health care professional if they continue or are bothersome):  changes in emotions or moods  constipation  diarrhea  loss of appetite  headache  irritation at site where injected  nausea This list may not describe all possible side effects. Call your doctor for medical advice about side effects. You may report side effects to FDA at 1-800-FDA-1088. Where should I keep my medicine? This drug is given in a hospital or clinic and will not be stored at home. NOTE: This sheet is a summary. It may not cover all possible information. If you have questions about this medicine, talk to your doctor, pharmacist, or health care provider.  2020 Elsevier/Gold Standard (2018-01-20 16:29:31)  

## 2020-01-15 NOTE — Progress Notes (Signed)
Ok to treat with creatinine of 1.79 per DR Marin Olp. dph

## 2020-01-22 NOTE — Progress Notes (Signed)
Pharmacist Chemotherapy Monitoring - Follow Up Assessment    I verify that I have reviewed each item in the below checklist:  . Regimen for the patient is scheduled for the appropriate day and plan matches scheduled date. Marland Kitchen Appropriate non-routine labs are ordered dependent on drug ordered. . If applicable, additional medications reviewed and ordered per protocol based on lifetime cumulative doses and/or treatment regimen.   Plan for follow-up and/or issues identified: No . I-vent associated with next due treatment: No . MD and/or nursing notified: No  Shawn Bauer, Shawn Bauer 01/22/2020 8:23 AM

## 2020-01-29 ENCOUNTER — Other Ambulatory Visit: Payer: Self-pay

## 2020-01-29 ENCOUNTER — Inpatient Hospital Stay (HOSPITAL_BASED_OUTPATIENT_CLINIC_OR_DEPARTMENT_OTHER): Payer: Medicare Other | Admitting: Hematology & Oncology

## 2020-01-29 ENCOUNTER — Inpatient Hospital Stay: Payer: Medicare Other

## 2020-01-29 ENCOUNTER — Inpatient Hospital Stay: Payer: Medicare Other | Attending: Family

## 2020-01-29 VITALS — BP 134/58 | HR 76 | Temp 97.6°F | Resp 18 | Wt 139.0 lb

## 2020-01-29 DIAGNOSIS — N189 Chronic kidney disease, unspecified: Secondary | ICD-10-CM | POA: Insufficient documentation

## 2020-01-29 DIAGNOSIS — C9000 Multiple myeloma not having achieved remission: Secondary | ICD-10-CM

## 2020-01-29 DIAGNOSIS — Z5112 Encounter for antineoplastic immunotherapy: Secondary | ICD-10-CM | POA: Insufficient documentation

## 2020-01-29 DIAGNOSIS — Z7982 Long term (current) use of aspirin: Secondary | ICD-10-CM | POA: Insufficient documentation

## 2020-01-29 DIAGNOSIS — Z7951 Long term (current) use of inhaled steroids: Secondary | ICD-10-CM | POA: Insufficient documentation

## 2020-01-29 DIAGNOSIS — I25119 Atherosclerotic heart disease of native coronary artery with unspecified angina pectoris: Secondary | ICD-10-CM | POA: Diagnosis not present

## 2020-01-29 DIAGNOSIS — Z79899 Other long term (current) drug therapy: Secondary | ICD-10-CM | POA: Diagnosis not present

## 2020-01-29 DIAGNOSIS — K5900 Constipation, unspecified: Secondary | ICD-10-CM | POA: Insufficient documentation

## 2020-01-29 LAB — CBC WITH DIFFERENTIAL (CANCER CENTER ONLY)
Abs Immature Granulocytes: 0.04 10*3/uL (ref 0.00–0.07)
Basophils Absolute: 0.1 10*3/uL (ref 0.0–0.1)
Basophils Relative: 1 %
Eosinophils Absolute: 0.2 10*3/uL (ref 0.0–0.5)
Eosinophils Relative: 3 %
HCT: 43.6 % (ref 39.0–52.0)
Hemoglobin: 14.3 g/dL (ref 13.0–17.0)
Immature Granulocytes: 1 %
Lymphocytes Relative: 28 %
Lymphs Abs: 2.1 10*3/uL (ref 0.7–4.0)
MCH: 32.6 pg (ref 26.0–34.0)
MCHC: 32.8 g/dL (ref 30.0–36.0)
MCV: 99.5 fL (ref 80.0–100.0)
Monocytes Absolute: 0.6 10*3/uL (ref 0.1–1.0)
Monocytes Relative: 9 %
Neutro Abs: 4.3 10*3/uL (ref 1.7–7.7)
Neutrophils Relative %: 58 %
Platelet Count: 232 10*3/uL (ref 150–400)
RBC: 4.38 MIL/uL (ref 4.22–5.81)
RDW: 12.7 % (ref 11.5–15.5)
WBC Count: 7.3 10*3/uL (ref 4.0–10.5)
nRBC: 0 % (ref 0.0–0.2)

## 2020-01-29 LAB — CMP (CANCER CENTER ONLY)
ALT: 16 U/L (ref 0–44)
AST: 16 U/L (ref 15–41)
Albumin: 4 g/dL (ref 3.5–5.0)
Alkaline Phosphatase: 42 U/L (ref 38–126)
Anion gap: 4 — ABNORMAL LOW (ref 5–15)
BUN: 24 mg/dL — ABNORMAL HIGH (ref 8–23)
CO2: 32 mmol/L (ref 22–32)
Calcium: 9.6 mg/dL (ref 8.9–10.3)
Chloride: 105 mmol/L (ref 98–111)
Creatinine: 1.71 mg/dL — ABNORMAL HIGH (ref 0.61–1.24)
GFR, Est AFR Am: 42 mL/min — ABNORMAL LOW (ref >60)
GFR, Estimated: 36 mL/min — ABNORMAL LOW (ref >60)
Glucose, Bld: 105 mg/dL — ABNORMAL HIGH (ref 70–99)
Potassium: 5 mmol/L (ref 3.5–5.1)
Sodium: 141 mmol/L (ref 135–145)
Total Bilirubin: 0.9 mg/dL (ref 0.3–1.2)
Total Protein: 6 g/dL — ABNORMAL LOW (ref 6.5–8.1)

## 2020-01-29 MED ORDER — DEXAMETHASONE 4 MG PO TABS
20.0000 mg | ORAL_TABLET | ORAL | Status: DC
  Administered 2020-01-29: 20 mg via ORAL

## 2020-01-29 MED ORDER — DEXAMETHASONE 4 MG PO TABS
ORAL_TABLET | ORAL | Status: AC
  Filled 2020-01-29: qty 5

## 2020-01-29 MED ORDER — PROCHLORPERAZINE MALEATE 10 MG PO TABS
ORAL_TABLET | ORAL | Status: AC
  Filled 2020-01-29: qty 1

## 2020-01-29 MED ORDER — BORTEZOMIB CHEMO SQ INJECTION 3.5 MG (2.5MG/ML)
1.3000 mg/m2 | Freq: Once | INTRAMUSCULAR | Status: AC
  Administered 2020-01-29: 2.25 mg via SUBCUTANEOUS
  Filled 2020-01-29: qty 0.9

## 2020-01-29 MED ORDER — PROCHLORPERAZINE MALEATE 10 MG PO TABS
10.0000 mg | ORAL_TABLET | Freq: Once | ORAL | Status: AC
  Administered 2020-01-29: 10 mg via ORAL

## 2020-01-29 NOTE — Progress Notes (Signed)
Ok to treat with creatinine of 1.71. dph

## 2020-01-29 NOTE — Progress Notes (Signed)
Hematology and Oncology Follow Up Visit  Chriss Bussman [facility] [id number] [birth year] 86 y.o. 01/29/2020   Principle Diagnosis:  IgG Kappa myeloma -- high risk due to t(4:21)  Current Therapy:   Velcade/Decadron -- started 10/09/2019, s/p cycle #4   Interim History:  Shawn Bauer is here today for follow-up and treatment.  Overall, he is doing quite well.  He is tolerated the treatment nicely so far.  Little bit concerned though.  More last saw him in, and the kappa light chain was 32 mg/dL.  Prior to this, it was 24 mg/dL.  We will have to see what this level is.  There is no monoclonal spike in his blood.  His kidney function is doing okay.  His creatinine is 1.71.  He is having no problems with bowels or bladder.  He is little constipated.  There is been no problems with fever.  He has had no cough.  Has had no rashes.  There is been no headache.  He has had no problems appetite.  He had a very good appetite yesterday.  His wife made homemade biscuits.    Overall, I would say performance status is ECOG 1.    Medications:  Allergies as of 01/29/2020   No Known Allergies     Medication List       Accurate as of Jan 29, 2020 10:31 AM. If you have any questions, ask your nurse or doctor.        aspirin 81 MG tablet Take 81 mg by mouth daily.   budesonide-formoterol 160-4.5 MCG/ACT inhaler Commonly known as: SYMBICORT Inhale 2 puffs into the lungs 2 (two) times daily.   cyanocobalamin 1000 MCG tablet Take 1,000 mcg by mouth daily.   famciclovir 250 MG tablet Commonly known as: FAMVIR Take 1 tablet (250 mg total) by mouth daily.   lovastatin 40 MG tablet Commonly known as: MEVACOR Take 2 tablets once a day   metoprolol succinate 50 MG 24 hr tablet Commonly known as: TOPROL-XL Take 50 mg by mouth daily. Patient taking 1/2 tab in the morning and 1/2 tab in the evening. Total 50 mg per day. Take with or immediately following a meal.   nitroGLYCERIN 0.4 MG SL tablet Commonly known  as: NITROSTAT Place 1 tablet (0.4 mg total) under the tongue every 5 (five) minutes as needed for chest pain.   olmesartan 20 MG tablet Commonly known as: BENICAR Take 20 mg by mouth daily.   traMADol 50 MG tablet Commonly known as: ULTRAM Take 50 mg by mouth 2 (two) times daily as needed.       Allergies: No Known Allergies  Past Medical History, Surgical history, Social history, and Family History were reviewed and updated.  Review of Systems: Review of Systems  Constitutional: Negative.   HENT: Negative.   Eyes: Negative.   Respiratory: Negative.   Cardiovascular: Negative.   Gastrointestinal: Negative.   Genitourinary: Negative.   Musculoskeletal: Negative.   Skin: Negative.   Neurological: Negative.   Endo/Heme/Allergies: Negative.   Psychiatric/Behavioral: Negative.      Physical Exam:  weight is 139 lb (63 kg). His temporal temperature is 97.6 F (36.4 C). His blood pressure is 134/58 (abnormal) and his pulse is 76. His respiration is 18 and oxygen saturation is 99%.   Wt Readings from Last 3 Encounters:  01/29/20 139 lb (63 kg)  01/01/20 138 lb (62.6 kg)  12/11/19 139 lb (63 kg)   Physical Exam Vitals reviewed.  HENT:  Head: Normocephalic and atraumatic.  Eyes:     Pupils: Pupils are equal, round, and reactive to light.  Cardiovascular:     Rate and Rhythm: Normal rate and regular rhythm.     Heart sounds: Normal heart sounds.  Pulmonary:     Effort: Pulmonary effort is normal.     Breath sounds: Normal breath sounds.  Abdominal:     General: Bowel sounds are normal.     Palpations: Abdomen is soft.  Musculoskeletal:        General: No tenderness or deformity. Normal range of motion.     Cervical back: Normal range of motion.  Lymphadenopathy:     Cervical: No cervical adenopathy.  Skin:    General: Skin is warm and dry.     Findings: No erythema or rash.  Neurological:     Mental Status: He is alert and oriented to person, place, and  time.  Psychiatric:        Behavior: Behavior normal.        Thought Content: Thought content normal.        Judgment: Judgment normal.      Lab Results  Component Value Date   WBC 7.3 01/29/2020   HGB 14.3 01/29/2020   HCT 43.6 01/29/2020   MCV 99.5 01/29/2020   PLT 232 01/29/2020   No results found for: FERRITIN, IRON, TIBC, UIBC, IRONPCTSAT Lab Results  Component Value Date   RBC 4.38 01/29/2020   Lab Results  Component Value Date   KPAFRELGTCHN 316.2 (H) 01/01/2020   LAMBDASER 9.4 01/01/2020   KAPLAMBRATIO 33.64 (H) 01/01/2020   Lab Results  Component Value Date   IGGSERUM 701 01/01/2020   IGA 118 01/01/2020   IGMSERUM 21 01/01/2020   Lab Results  Component Value Date   TOTALPROTELP 5.9 (L) 01/01/2020   ALBUMINELP 3.4 01/01/2020   A1GS 0.3 01/01/2020   A2GS 0.7 01/01/2020   BETS 0.9 01/01/2020   GAMS 0.7 01/01/2020   MSPIKE Not Observed 01/01/2020   SPEI Comment 01/01/2020     Chemistry      Component Value Date/Time   NA 141 01/29/2020 0938   K 5.0 01/29/2020 0938   CL 105 01/29/2020 0938   CO2 32 01/29/2020 0938   BUN 24 (H) 01/29/2020 0938   CREATININE 1.71 (H) 01/29/2020 0938      Component Value Date/Time   CALCIUM 9.6 01/29/2020 0938   ALKPHOS 42 01/29/2020 0938   AST 16 01/29/2020 0938   ALT 16 01/29/2020 0938   BILITOT 0.9 01/29/2020 0938       Impression and Plan: Mr. Savko is a very pleasant 86 yo caucasian gentleman with IgG kappa myeloma.  His big problem has been the kappa light chain being so elevated.  He has some chronic renal insufficiency which may or may not be from the light chain.  If we find that the light chains are going up, we are going to have to make a change and probably add Revlimid to the protocol.  I think that would be the simplest way to go.  We will plan to see him back in 1 month.  He will get his Velcade 3 weeks in a row.      [name], MD 5/7/202110:31 AM

## 2020-01-29 NOTE — Patient Instructions (Signed)
Bortezomib injection What is this medicine? BORTEZOMIB (bor TEZ oh mib) is a medicine that targets proteins in cancer cells and stops the cancer cells from growing. It is used to treat multiple myeloma and mantle-cell lymphoma. This medicine may be used for other purposes; ask your health care provider or pharmacist if you have questions. COMMON BRAND NAME(S): Velcade What should I tell my health care provider before I take this medicine? They need to know if you have any of these conditions:  diabetes  heart disease  irregular heartbeat  liver disease  on hemodialysis  low blood counts, like low white blood cells, platelets, or hemoglobin  peripheral neuropathy  taking medicine for blood pressure  an unusual or allergic reaction to bortezomib, mannitol, boron, other medicines, foods, dyes, or preservatives  pregnant or trying to get pregnant  breast-feeding How should I use this medicine? This medicine is for injection into a vein or for injection under the skin. It is given by a health care professional in a hospital or clinic setting. Talk to your pediatrician regarding the use of this medicine in children. Special care may be needed. Overdosage: If you think you have taken too much of this medicine contact a poison control center or emergency room at once. NOTE: This medicine is only for you. Do not share this medicine with others. What if I miss a dose? It is important not to miss your dose. Call your doctor or health care professional if you are unable to keep an appointment. What may interact with this medicine? This medicine may interact with the following medications:  ketoconazole  rifampin  ritonavir  St. John's Wort This list may not describe all possible interactions. Give your health care provider a list of all the medicines, herbs, non-prescription drugs, or dietary supplements you use. Also tell them if you smoke, drink alcohol, or use illegal drugs. Some  items may interact with your medicine. What should I watch for while using this medicine? You may get drowsy or dizzy. Do not drive, use machinery, or do anything that needs mental alertness until you know how this medicine affects you. Do not stand or sit up quickly, especially if you are an older patient. This reduces the risk of dizzy or fainting spells. In some cases, you may be given additional medicines to help with side effects. Follow all directions for their use. Call your doctor or health care professional for advice if you get a fever, chills or sore throat, or other symptoms of a cold or flu. Do not treat yourself. This drug decreases your body's ability to fight infections. Try to avoid being around people who are sick. This medicine may increase your risk to bruise or bleed. Call your doctor or health care professional if you notice any unusual bleeding. You may need blood work done while you are taking this medicine. In some patients, this medicine may cause a serious brain infection that may cause death. If you have any problems seeing, thinking, speaking, walking, or standing, tell your doctor right away. If you cannot reach your doctor, urgently seek other source of medical care. Check with your doctor or health care professional if you get an attack of severe diarrhea, nausea and vomiting, or if you sweat a lot. The loss of too much body fluid can make it dangerous for you to take this medicine. Do not become pregnant while taking this medicine or for at least 7 months after stopping it. Women should inform their doctor   if they wish to become pregnant or think they might be pregnant. Men should not father a child while taking this medicine and for at least 4 months after stopping it. There is a potential for serious side effects to an unborn child. Talk to your health care professional or pharmacist for more information. Do not breast-feed an infant while taking this medicine or for 2  months after stopping it. This medicine may interfere with the ability to have a child. You should talk with your doctor or health care professional if you are concerned about your fertility. What side effects may I notice from receiving this medicine? Side effects that you should report to your doctor or health care professional as soon as possible:  allergic reactions like skin rash, itching or hives, swelling of the face, lips, or tongue  breathing problems  changes in hearing  changes in vision  fast, irregular heartbeat  feeling faint or lightheaded, falls  pain, tingling, numbness in the hands or feet  right upper belly pain  seizures  swelling of the ankles, feet, hands  unusual bleeding or bruising  unusually weak or tired  vomiting  yellowing of the eyes or skin Side effects that usually do not require medical attention (report to your doctor or health care professional if they continue or are bothersome):  changes in emotions or moods  constipation  diarrhea  loss of appetite  headache  irritation at site where injected  nausea This list may not describe all possible side effects. Call your doctor for medical advice about side effects. You may report side effects to FDA at 1-800-FDA-1088. Where should I keep my medicine? This drug is given in a hospital or clinic and will not be stored at home. NOTE: This sheet is a summary. It may not cover all possible information. If you have questions about this medicine, talk to your doctor, pharmacist, or health care provider.  2020 Elsevier/Gold Standard (2018-01-20 16:29:31)  

## 2020-01-30 LAB — IGG, IGA, IGM
IgA: 111 mg/dL (ref 61–437)
IgG (Immunoglobin G), Serum: 651 mg/dL (ref 603–1613)
IgM (Immunoglobulin M), Srm: 20 mg/dL (ref 15–143)

## 2020-02-01 LAB — PROTEIN ELECTROPHORESIS, SERUM, WITH REFLEX
A/G Ratio: 1.3 (ref 0.7–1.7)
Albumin ELP: 3.3 g/dL (ref 2.9–4.4)
Alpha-1-Globulin: 0.3 g/dL (ref 0.0–0.4)
Alpha-2-Globulin: 0.7 g/dL (ref 0.4–1.0)
Beta Globulin: 0.9 g/dL (ref 0.7–1.3)
Gamma Globulin: 0.8 g/dL (ref 0.4–1.8)
Globulin, Total: 2.6 g/dL (ref 2.2–3.9)
Total Protein ELP: 5.9 g/dL — ABNORMAL LOW (ref 6.0–8.5)

## 2020-02-01 LAB — KAPPA/LAMBDA LIGHT CHAINS
Kappa free light chain: 246.1 mg/L — ABNORMAL HIGH (ref 3.3–19.4)
Kappa, lambda light chain ratio: 27.34 — ABNORMAL HIGH (ref 0.26–1.65)
Lambda free light chains: 9 mg/L (ref 5.7–26.3)

## 2020-02-05 ENCOUNTER — Inpatient Hospital Stay: Payer: Medicare Other

## 2020-02-05 ENCOUNTER — Other Ambulatory Visit: Payer: Self-pay

## 2020-02-05 ENCOUNTER — Other Ambulatory Visit: Payer: Self-pay | Admitting: *Deleted

## 2020-02-05 VITALS — BP 137/59 | HR 77 | Temp 97.2°F | Resp 17

## 2020-02-05 DIAGNOSIS — C9000 Multiple myeloma not having achieved remission: Secondary | ICD-10-CM

## 2020-02-05 DIAGNOSIS — Z5112 Encounter for antineoplastic immunotherapy: Secondary | ICD-10-CM | POA: Diagnosis not present

## 2020-02-05 LAB — CBC WITH DIFFERENTIAL (CANCER CENTER ONLY)
Abs Immature Granulocytes: 0.03 10*3/uL (ref 0.00–0.07)
Basophils Absolute: 0 10*3/uL (ref 0.0–0.1)
Basophils Relative: 1 %
Eosinophils Absolute: 0.3 10*3/uL (ref 0.0–0.5)
Eosinophils Relative: 4 %
HCT: 39.8 % (ref 39.0–52.0)
Hemoglobin: 13 g/dL (ref 13.0–17.0)
Immature Granulocytes: 0 %
Lymphocytes Relative: 25 %
Lymphs Abs: 2 10*3/uL (ref 0.7–4.0)
MCH: 32.2 pg (ref 26.0–34.0)
MCHC: 32.7 g/dL (ref 30.0–36.0)
MCV: 98.5 fL (ref 80.0–100.0)
Monocytes Absolute: 0.9 10*3/uL (ref 0.1–1.0)
Monocytes Relative: 11 %
Neutro Abs: 4.8 10*3/uL (ref 1.7–7.7)
Neutrophils Relative %: 59 %
Platelet Count: 221 10*3/uL (ref 150–400)
RBC: 4.04 MIL/uL — ABNORMAL LOW (ref 4.22–5.81)
RDW: 12.5 % (ref 11.5–15.5)
WBC Count: 8.1 10*3/uL (ref 4.0–10.5)
nRBC: 0 % (ref 0.0–0.2)

## 2020-02-05 LAB — CMP (CANCER CENTER ONLY)
ALT: 12 U/L (ref 0–44)
AST: 12 U/L — ABNORMAL LOW (ref 15–41)
Albumin: 3.7 g/dL (ref 3.5–5.0)
Alkaline Phosphatase: 42 U/L (ref 38–126)
Anion gap: 5 (ref 5–15)
BUN: 24 mg/dL — ABNORMAL HIGH (ref 8–23)
CO2: 29 mmol/L (ref 22–32)
Calcium: 9.1 mg/dL (ref 8.9–10.3)
Chloride: 106 mmol/L (ref 98–111)
Creatinine: 1.76 mg/dL — ABNORMAL HIGH (ref 0.61–1.24)
GFR, Est AFR Am: 41 mL/min — ABNORMAL LOW (ref >60)
GFR, Estimated: 35 mL/min — ABNORMAL LOW (ref >60)
Glucose, Bld: 91 mg/dL (ref 70–99)
Potassium: 4.7 mmol/L (ref 3.5–5.1)
Sodium: 140 mmol/L (ref 135–145)
Total Bilirubin: 0.7 mg/dL (ref 0.3–1.2)
Total Protein: 5.7 g/dL — ABNORMAL LOW (ref 6.5–8.1)

## 2020-02-05 MED ORDER — PROCHLORPERAZINE MALEATE 10 MG PO TABS
ORAL_TABLET | ORAL | Status: AC
  Filled 2020-02-05: qty 1

## 2020-02-05 MED ORDER — DEXAMETHASONE 4 MG PO TABS
20.0000 mg | ORAL_TABLET | ORAL | Status: DC
  Administered 2020-02-05: 20 mg via ORAL

## 2020-02-05 MED ORDER — PROCHLORPERAZINE MALEATE 10 MG PO TABS
10.0000 mg | ORAL_TABLET | Freq: Once | ORAL | Status: AC
  Administered 2020-02-05: 10 mg via ORAL

## 2020-02-05 MED ORDER — BORTEZOMIB CHEMO SQ INJECTION 3.5 MG (2.5MG/ML)
1.3000 mg/m2 | Freq: Once | INTRAMUSCULAR | Status: AC
  Administered 2020-02-05: 2.25 mg via SUBCUTANEOUS
  Filled 2020-02-05: qty 0.9

## 2020-02-05 MED ORDER — DEXAMETHASONE 4 MG PO TABS
ORAL_TABLET | ORAL | Status: AC
  Filled 2020-02-05: qty 5

## 2020-02-05 NOTE — Patient Instructions (Signed)
Bortezomib injection What is this medicine? BORTEZOMIB (bor TEZ oh mib) is a medicine that targets proteins in cancer cells and stops the cancer cells from growing. It is used to treat multiple myeloma and mantle-cell lymphoma. This medicine may be used for other purposes; ask your health care provider or pharmacist if you have questions. COMMON BRAND NAME(S): Velcade What should I tell my health care provider before I take this medicine? They need to know if you have any of these conditions:  diabetes  heart disease  irregular heartbeat  liver disease  on hemodialysis  low blood counts, like low white blood cells, platelets, or hemoglobin  peripheral neuropathy  taking medicine for blood pressure  an unusual or allergic reaction to bortezomib, mannitol, boron, other medicines, foods, dyes, or preservatives  pregnant or trying to get pregnant  breast-feeding How should I use this medicine? This medicine is for injection into a vein or for injection under the skin. It is given by a health care professional in a hospital or clinic setting. Talk to your pediatrician regarding the use of this medicine in children. Special care may be needed. Overdosage: If you think you have taken too much of this medicine contact a poison control center or emergency room at once. NOTE: This medicine is only for you. Do not share this medicine with others. What if I miss a dose? It is important not to miss your dose. Call your doctor or health care professional if you are unable to keep an appointment. What may interact with this medicine? This medicine may interact with the following medications:  ketoconazole  rifampin  ritonavir  St. John's Wort This list may not describe all possible interactions. Give your health care provider a list of all the medicines, herbs, non-prescription drugs, or dietary supplements you use. Also tell them if you smoke, drink alcohol, or use illegal drugs. Some  items may interact with your medicine. What should I watch for while using this medicine? You may get drowsy or dizzy. Do not drive, use machinery, or do anything that needs mental alertness until you know how this medicine affects you. Do not stand or sit up quickly, especially if you are an older patient. This reduces the risk of dizzy or fainting spells. In some cases, you may be given additional medicines to help with side effects. Follow all directions for their use. Call your doctor or health care professional for advice if you get a fever, chills or sore throat, or other symptoms of a cold or flu. Do not treat yourself. This drug decreases your body's ability to fight infections. Try to avoid being around people who are sick. This medicine may increase your risk to bruise or bleed. Call your doctor or health care professional if you notice any unusual bleeding. You may need blood work done while you are taking this medicine. In some patients, this medicine may cause a serious brain infection that may cause death. If you have any problems seeing, thinking, speaking, walking, or standing, tell your doctor right away. If you cannot reach your doctor, urgently seek other source of medical care. Check with your doctor or health care professional if you get an attack of severe diarrhea, nausea and vomiting, or if you sweat a lot. The loss of too much body fluid can make it dangerous for you to take this medicine. Do not become pregnant while taking this medicine or for at least 7 months after stopping it. Women should inform their doctor   if they wish to become pregnant or think they might be pregnant. Men should not father a child while taking this medicine and for at least 4 months after stopping it. There is a potential for serious side effects to an unborn child. Talk to your health care professional or pharmacist for more information. Do not breast-feed an infant while taking this medicine or for 2  months after stopping it. This medicine may interfere with the ability to have a child. You should talk with your doctor or health care professional if you are concerned about your fertility. What side effects may I notice from receiving this medicine? Side effects that you should report to your doctor or health care professional as soon as possible:  allergic reactions like skin rash, itching or hives, swelling of the face, lips, or tongue  breathing problems  changes in hearing  changes in vision  fast, irregular heartbeat  feeling faint or lightheaded, falls  pain, tingling, numbness in the hands or feet  right upper belly pain  seizures  swelling of the ankles, feet, hands  unusual bleeding or bruising  unusually weak or tired  vomiting  yellowing of the eyes or skin Side effects that usually do not require medical attention (report to your doctor or health care professional if they continue or are bothersome):  changes in emotions or moods  constipation  diarrhea  loss of appetite  headache  irritation at site where injected  nausea This list may not describe all possible side effects. Call your doctor for medical advice about side effects. You may report side effects to FDA at 1-800-FDA-1088. Where should I keep my medicine? This drug is given in a hospital or clinic and will not be stored at home. NOTE: This sheet is a summary. It may not cover all possible information. If you have questions about this medicine, talk to your doctor, pharmacist, or health care provider.  2020 Elsevier/Gold Standard (2018-01-20 16:29:31)  

## 2020-02-05 NOTE — Progress Notes (Signed)
Pharmacist Chemotherapy Monitoring - Follow Up Assessment    I verify that I have reviewed each item in the below checklist:  . Regimen for the patient is scheduled for the appropriate day and plan matches scheduled date. Marland Kitchen Appropriate non-routine labs are ordered dependent on drug ordered. . If applicable, additional medications reviewed and ordered per protocol based on lifetime cumulative doses and/or treatment regimen.   Plan for follow-up and/or issues identified: No . I-vent associated with next due treatment: No . MD and/or nursing notified: No  Acquanetta Belling 02/05/2020 12:58 PM

## 2020-02-05 NOTE — Progress Notes (Signed)
Reviewed patient labs with Dr. Marin Olp and pt ok to treat with creatinine of 1.76

## 2020-02-11 ENCOUNTER — Other Ambulatory Visit: Payer: Self-pay | Admitting: *Deleted

## 2020-02-11 DIAGNOSIS — C9000 Multiple myeloma not having achieved remission: Secondary | ICD-10-CM

## 2020-02-12 ENCOUNTER — Inpatient Hospital Stay: Payer: Medicare Other

## 2020-02-12 ENCOUNTER — Other Ambulatory Visit: Payer: Self-pay

## 2020-02-12 VITALS — BP 133/63 | HR 68 | Temp 97.6°F | Resp 17

## 2020-02-12 DIAGNOSIS — Z5112 Encounter for antineoplastic immunotherapy: Secondary | ICD-10-CM | POA: Diagnosis not present

## 2020-02-12 DIAGNOSIS — C9000 Multiple myeloma not having achieved remission: Secondary | ICD-10-CM

## 2020-02-12 LAB — CBC WITH DIFFERENTIAL (CANCER CENTER ONLY)
Abs Immature Granulocytes: 0.15 10*3/uL — ABNORMAL HIGH (ref 0.00–0.07)
Basophils Absolute: 0.1 10*3/uL (ref 0.0–0.1)
Basophils Relative: 1 %
Eosinophils Absolute: 0.3 10*3/uL (ref 0.0–0.5)
Eosinophils Relative: 2 %
HCT: 42.3 % (ref 39.0–52.0)
Hemoglobin: 13.8 g/dL (ref 13.0–17.0)
Immature Granulocytes: 1 %
Lymphocytes Relative: 24 %
Lymphs Abs: 2.7 10*3/uL (ref 0.7–4.0)
MCH: 32.7 pg (ref 26.0–34.0)
MCHC: 32.6 g/dL (ref 30.0–36.0)
MCV: 100.2 fL — ABNORMAL HIGH (ref 80.0–100.0)
Monocytes Absolute: 0.9 10*3/uL (ref 0.1–1.0)
Monocytes Relative: 8 %
Neutro Abs: 7.1 10*3/uL (ref 1.7–7.7)
Neutrophils Relative %: 64 %
Platelet Count: 206 10*3/uL (ref 150–400)
RBC: 4.22 MIL/uL (ref 4.22–5.81)
RDW: 12.7 % (ref 11.5–15.5)
WBC Count: 11.1 10*3/uL — ABNORMAL HIGH (ref 4.0–10.5)
nRBC: 0 % (ref 0.0–0.2)

## 2020-02-12 LAB — CMP (CANCER CENTER ONLY)
ALT: 13 U/L (ref 0–44)
AST: 13 U/L — ABNORMAL LOW (ref 15–41)
Albumin: 3.8 g/dL (ref 3.5–5.0)
Alkaline Phosphatase: 52 U/L (ref 38–126)
Anion gap: 4 — ABNORMAL LOW (ref 5–15)
BUN: 23 mg/dL (ref 8–23)
CO2: 32 mmol/L (ref 22–32)
Calcium: 9.6 mg/dL (ref 8.9–10.3)
Chloride: 104 mmol/L (ref 98–111)
Creatinine: 1.6 mg/dL — ABNORMAL HIGH (ref 0.61–1.24)
GFR, Est AFR Am: 45 mL/min — ABNORMAL LOW (ref >60)
GFR, Estimated: 39 mL/min — ABNORMAL LOW (ref >60)
Glucose, Bld: 125 mg/dL — ABNORMAL HIGH (ref 70–99)
Potassium: 4.8 mmol/L (ref 3.5–5.1)
Sodium: 140 mmol/L (ref 135–145)
Total Bilirubin: 0.6 mg/dL (ref 0.3–1.2)
Total Protein: 6.1 g/dL — ABNORMAL LOW (ref 6.5–8.1)

## 2020-02-12 MED ORDER — BORTEZOMIB CHEMO SQ INJECTION 3.5 MG (2.5MG/ML)
1.3000 mg/m2 | Freq: Once | INTRAMUSCULAR | Status: AC
  Administered 2020-02-12: 2.25 mg via SUBCUTANEOUS
  Filled 2020-02-12: qty 0.9

## 2020-02-12 MED ORDER — PROCHLORPERAZINE MALEATE 10 MG PO TABS
10.0000 mg | ORAL_TABLET | Freq: Once | ORAL | Status: AC
  Administered 2020-02-12: 10 mg via ORAL

## 2020-02-12 MED ORDER — DEXAMETHASONE 4 MG PO TABS
20.0000 mg | ORAL_TABLET | ORAL | Status: DC
  Administered 2020-02-12: 20 mg via ORAL

## 2020-02-12 MED ORDER — DEXAMETHASONE 4 MG PO TABS
ORAL_TABLET | ORAL | Status: AC
  Filled 2020-02-12: qty 5

## 2020-02-12 MED ORDER — PROCHLORPERAZINE MALEATE 10 MG PO TABS
ORAL_TABLET | ORAL | Status: AC
  Filled 2020-02-12: qty 1

## 2020-02-12 NOTE — Patient Instructions (Signed)
Bortezomib injection What is this medicine? BORTEZOMIB (bor TEZ oh mib) is a medicine that targets proteins in cancer cells and stops the cancer cells from growing. It is used to treat multiple myeloma and mantle-cell lymphoma. This medicine may be used for other purposes; ask your health care provider or pharmacist if you have questions. COMMON BRAND NAME(S): Velcade What should I tell my health care provider before I take this medicine? They need to know if you have any of these conditions:  diabetes  heart disease  irregular heartbeat  liver disease  on hemodialysis  low blood counts, like low white blood cells, platelets, or hemoglobin  peripheral neuropathy  taking medicine for blood pressure  an unusual or allergic reaction to bortezomib, mannitol, boron, other medicines, foods, dyes, or preservatives  pregnant or trying to get pregnant  breast-feeding How should I use this medicine? This medicine is for injection into a vein or for injection under the skin. It is given by a health care professional in a hospital or clinic setting. Talk to your pediatrician regarding the use of this medicine in children. Special care may be needed. Overdosage: If you think you have taken too much of this medicine contact a poison control center or emergency room at once. NOTE: This medicine is only for you. Do not share this medicine with others. What if I miss a dose? It is important not to miss your dose. Call your doctor or health care professional if you are unable to keep an appointment. What may interact with this medicine? This medicine may interact with the following medications:  ketoconazole  rifampin  ritonavir  St. John's Wort This list may not describe all possible interactions. Give your health care provider a list of all the medicines, herbs, non-prescription drugs, or dietary supplements you use. Also tell them if you smoke, drink alcohol, or use illegal drugs. Some  items may interact with your medicine. What should I watch for while using this medicine? You may get drowsy or dizzy. Do not drive, use machinery, or do anything that needs mental alertness until you know how this medicine affects you. Do not stand or sit up quickly, especially if you are an older patient. This reduces the risk of dizzy or fainting spells. In some cases, you may be given additional medicines to help with side effects. Follow all directions for their use. Call your doctor or health care professional for advice if you get a fever, chills or sore throat, or other symptoms of a cold or flu. Do not treat yourself. This drug decreases your body's ability to fight infections. Try to avoid being around people who are sick. This medicine may increase your risk to bruise or bleed. Call your doctor or health care professional if you notice any unusual bleeding. You may need blood work done while you are taking this medicine. In some patients, this medicine may cause a serious brain infection that may cause death. If you have any problems seeing, thinking, speaking, walking, or standing, tell your doctor right away. If you cannot reach your doctor, urgently seek other source of medical care. Check with your doctor or health care professional if you get an attack of severe diarrhea, nausea and vomiting, or if you sweat a lot. The loss of too much body fluid can make it dangerous for you to take this medicine. Do not become pregnant while taking this medicine or for at least 7 months after stopping it. Women should inform their doctor   if they wish to become pregnant or think they might be pregnant. Men should not father a child while taking this medicine and for at least 4 months after stopping it. There is a potential for serious side effects to an unborn child. Talk to your health care professional or pharmacist for more information. Do not breast-feed an infant while taking this medicine or for 2  months after stopping it. This medicine may interfere with the ability to have a child. You should talk with your doctor or health care professional if you are concerned about your fertility. What side effects may I notice from receiving this medicine? Side effects that you should report to your doctor or health care professional as soon as possible:  allergic reactions like skin rash, itching or hives, swelling of the face, lips, or tongue  breathing problems  changes in hearing  changes in vision  fast, irregular heartbeat  feeling faint or lightheaded, falls  pain, tingling, numbness in the hands or feet  right upper belly pain  seizures  swelling of the ankles, feet, hands  unusual bleeding or bruising  unusually weak or tired  vomiting  yellowing of the eyes or skin Side effects that usually do not require medical attention (report to your doctor or health care professional if they continue or are bothersome):  changes in emotions or moods  constipation  diarrhea  loss of appetite  headache  irritation at site where injected  nausea This list may not describe all possible side effects. Call your doctor for medical advice about side effects. You may report side effects to FDA at 1-800-FDA-1088. Where should I keep my medicine? This drug is given in a hospital or clinic and will not be stored at home. NOTE: This sheet is a summary. It may not cover all possible information. If you have questions about this medicine, talk to your doctor, pharmacist, or health care provider.  2020 Elsevier/Gold Standard (2018-01-20 16:29:31)  

## 2020-02-12 NOTE — Progress Notes (Signed)
Okay to treat today with SCr 1.6 per Dr. Ennever. 

## 2020-02-25 ENCOUNTER — Other Ambulatory Visit: Payer: Self-pay

## 2020-02-25 ENCOUNTER — Inpatient Hospital Stay: Payer: Medicare Other | Attending: Family

## 2020-02-25 ENCOUNTER — Inpatient Hospital Stay (HOSPITAL_BASED_OUTPATIENT_CLINIC_OR_DEPARTMENT_OTHER): Payer: Medicare Other | Admitting: Hematology & Oncology

## 2020-02-25 ENCOUNTER — Inpatient Hospital Stay: Payer: Medicare Other

## 2020-02-25 ENCOUNTER — Encounter: Payer: Self-pay | Admitting: Hematology & Oncology

## 2020-02-25 VITALS — BP 110/66 | HR 85 | Temp 97.6°F | Resp 18 | Ht 69.5 in | Wt 138.1 lb

## 2020-02-25 DIAGNOSIS — I25119 Atherosclerotic heart disease of native coronary artery with unspecified angina pectoris: Secondary | ICD-10-CM | POA: Diagnosis not present

## 2020-02-25 DIAGNOSIS — Z5111 Encounter for antineoplastic chemotherapy: Secondary | ICD-10-CM | POA: Insufficient documentation

## 2020-02-25 DIAGNOSIS — Z79899 Other long term (current) drug therapy: Secondary | ICD-10-CM | POA: Diagnosis not present

## 2020-02-25 DIAGNOSIS — Z7951 Long term (current) use of inhaled steroids: Secondary | ICD-10-CM | POA: Diagnosis not present

## 2020-02-25 DIAGNOSIS — C9000 Multiple myeloma not having achieved remission: Secondary | ICD-10-CM

## 2020-02-25 DIAGNOSIS — Z7982 Long term (current) use of aspirin: Secondary | ICD-10-CM | POA: Insufficient documentation

## 2020-02-25 LAB — CBC WITH DIFFERENTIAL (CANCER CENTER ONLY)
Abs Immature Granulocytes: 0.04 10*3/uL (ref 0.00–0.07)
Basophils Absolute: 0.1 10*3/uL (ref 0.0–0.1)
Basophils Relative: 1 %
Eosinophils Absolute: 0.2 10*3/uL (ref 0.0–0.5)
Eosinophils Relative: 2 %
HCT: 42.2 % (ref 39.0–52.0)
Hemoglobin: 13.4 g/dL (ref 13.0–17.0)
Immature Granulocytes: 1 %
Lymphocytes Relative: 27 %
Lymphs Abs: 2.2 10*3/uL (ref 0.7–4.0)
MCH: 32.4 pg (ref 26.0–34.0)
MCHC: 31.8 g/dL (ref 30.0–36.0)
MCV: 101.9 fL — ABNORMAL HIGH (ref 80.0–100.0)
Monocytes Absolute: 0.7 10*3/uL (ref 0.1–1.0)
Monocytes Relative: 9 %
Neutro Abs: 4.8 10*3/uL (ref 1.7–7.7)
Neutrophils Relative %: 60 %
Platelet Count: 227 10*3/uL (ref 150–400)
RBC: 4.14 MIL/uL — ABNORMAL LOW (ref 4.22–5.81)
RDW: 13 % (ref 11.5–15.5)
WBC Count: 8 10*3/uL (ref 4.0–10.5)
nRBC: 0 % (ref 0.0–0.2)

## 2020-02-25 LAB — CMP (CANCER CENTER ONLY)
ALT: 16 U/L (ref 0–44)
AST: 15 U/L (ref 15–41)
Albumin: 3.9 g/dL (ref 3.5–5.0)
Alkaline Phosphatase: 54 U/L (ref 38–126)
Anion gap: 6 (ref 5–15)
BUN: 29 mg/dL — ABNORMAL HIGH (ref 8–23)
CO2: 30 mmol/L (ref 22–32)
Calcium: 9.4 mg/dL (ref 8.9–10.3)
Chloride: 105 mmol/L (ref 98–111)
Creatinine: 1.78 mg/dL — ABNORMAL HIGH (ref 0.61–1.24)
GFR, Est AFR Am: 40 mL/min — ABNORMAL LOW (ref >60)
GFR, Estimated: 35 mL/min — ABNORMAL LOW (ref >60)
Glucose, Bld: 143 mg/dL — ABNORMAL HIGH (ref 70–99)
Potassium: 4.4 mmol/L (ref 3.5–5.1)
Sodium: 141 mmol/L (ref 135–145)
Total Bilirubin: 0.7 mg/dL (ref 0.3–1.2)
Total Protein: 6.2 g/dL — ABNORMAL LOW (ref 6.5–8.1)

## 2020-02-25 MED ORDER — PROCHLORPERAZINE MALEATE 10 MG PO TABS
ORAL_TABLET | ORAL | Status: AC
  Filled 2020-02-25: qty 1

## 2020-02-25 MED ORDER — DEXAMETHASONE 4 MG PO TABS
20.0000 mg | ORAL_TABLET | ORAL | Status: DC
  Administered 2020-02-25: 20 mg via ORAL

## 2020-02-25 MED ORDER — DEXAMETHASONE 4 MG PO TABS
ORAL_TABLET | ORAL | Status: AC
  Filled 2020-02-25: qty 5

## 2020-02-25 MED ORDER — BORTEZOMIB CHEMO SQ INJECTION 3.5 MG (2.5MG/ML)
1.3000 mg/m2 | Freq: Once | INTRAMUSCULAR | Status: AC
  Administered 2020-02-25: 2.25 mg via SUBCUTANEOUS
  Filled 2020-02-25: qty 0.9

## 2020-02-25 MED ORDER — PROCHLORPERAZINE MALEATE 10 MG PO TABS
10.0000 mg | ORAL_TABLET | Freq: Once | ORAL | Status: AC
  Administered 2020-02-25: 10 mg via ORAL

## 2020-02-25 NOTE — Progress Notes (Signed)
Hematology and Oncology Follow Up Visit  Shawn Bauer [facility] [id number] 1934/08/12 86 y.o. 02/25/2020   Principle Diagnosis:  IgG Kappa myeloma -- high risk due to t(4:21)  Current Therapy:   Velcade/Decadron -- started 10/09/2019, s/p cycle #5   Interim History:  Shawn Bauer is here today for follow-up and treatment.  He looks quite good.  He has had no specific complaints.  He had a nice Memorial Day weekend.  There is been no problems with fever.  He has had no cough or shortness of breath.  He has had no nausea or vomiting.  He is urinating well.  His last kappa light chain was 24.6 mg/dL.  This is improved.  There is been no problems with leg swelling.  Has had no rashes.  There is no tingling in the hands or feet.  Overall, his performance status is ECOG 1.    Medications:  Allergies as of 02/25/2020   No Known Allergies     Medication List       Accurate as of February 25, 2020  2:12 PM. If you have any questions, ask your nurse or doctor.        STOP taking these medications   famciclovir 250 MG tablet Commonly known as: FAMVIR Stopped by: [name], MD     TAKE these medications   aspirin 81 MG tablet Take 81 mg by mouth daily.   budesonide-formoterol 160-4.5 MCG/ACT inhaler Commonly known as: SYMBICORT Inhale 2 puffs into the lungs 2 (two) times daily.   cyanocobalamin 1000 MCG tablet Take 1,000 mcg by mouth daily.   lovastatin 40 MG tablet Commonly known as: MEVACOR Take 2 tablets once a day   metoprolol succinate 50 MG 24 hr tablet Commonly known as: TOPROL-XL Take 50 mg by mouth daily. Patient taking 1/2 tab in the morning and 1/2 tab in the evening. Total 50 mg per day. Take with or immediately following a meal.   nitroGLYCERIN 0.4 MG SL tablet Commonly known as: NITROSTAT Place 1 tablet (0.4 mg total) under the tongue every 5 (five) minutes as needed for chest pain.   olmesartan 20 MG tablet Commonly known as: BENICAR Take 20 mg by mouth  daily.   traMADol 50 MG tablet Commonly known as: ULTRAM Take 50 mg by mouth 2 (two) times daily as needed.       Allergies: No Known Allergies  Past Medical History, Surgical history, Social history, and Family History were reviewed and updated.  Review of Systems: Review of Systems  Constitutional: Negative.   HENT: Negative.   Eyes: Negative.   Respiratory: Negative.   Cardiovascular: Negative.   Gastrointestinal: Negative.   Genitourinary: Negative.   Musculoskeletal: Negative.   Skin: Negative.   Neurological: Negative.   Endo/Heme/Allergies: Negative.   Psychiatric/Behavioral: Negative.      Physical Exam:  height is 5' 9.5" (1.765 m) and weight is 138 lb 1.9 oz (62.7 kg). His temporal temperature is 97.6 F (36.4 C). His blood pressure is 110/66 and his pulse is 85. His respiration is 18 and oxygen saturation is 98%.   Wt Readings from Last 3 Encounters:  02/25/20 138 lb 1.9 oz (62.7 kg)  01/29/20 139 lb (63 kg)  01/01/20 138 lb (62.6 kg)   Physical Exam Vitals reviewed.  HENT:     Head: Normocephalic and atraumatic.  Eyes:     Pupils: Pupils are equal, round, and reactive to light.  Cardiovascular:     Rate and Rhythm: Normal rate  and regular rhythm.     Heart sounds: Normal heart sounds.  Pulmonary:     Effort: Pulmonary effort is normal.     Breath sounds: Normal breath sounds.  Abdominal:     General: Bowel sounds are normal.     Palpations: Abdomen is soft.  Musculoskeletal:        General: No tenderness or deformity. Normal range of motion.     Cervical back: Normal range of motion.  Lymphadenopathy:     Cervical: No cervical adenopathy.  Skin:    General: Skin is warm and dry.     Findings: No erythema or rash.  Neurological:     Mental Status: He is alert and oriented to person, place, and time.  Psychiatric:        Behavior: Behavior normal.        Thought Content: Thought content normal.        Judgment: Judgment normal.       Lab Results  Component Value Date   WBC 8.0 02/25/2020   HGB 13.4 02/25/2020   HCT 42.2 02/25/2020   MCV 101.9 (H) 02/25/2020   PLT 227 02/25/2020   No results found for: FERRITIN, IRON, TIBC, UIBC, IRONPCTSAT Lab Results  Component Value Date   RBC 4.14 (L) 02/25/2020   Lab Results  Component Value Date   KPAFRELGTCHN 246.1 (H) 01/29/2020   LAMBDASER 9.0 01/29/2020   KAPLAMBRATIO 27.34 (H) 01/29/2020   Lab Results  Component Value Date   IGGSERUM 651 01/29/2020   IGA 111 01/29/2020   IGMSERUM 20 01/29/2020   Lab Results  Component Value Date   TOTALPROTELP 5.9 (L) 01/29/2020   ALBUMINELP 3.3 01/29/2020   A1GS 0.3 01/29/2020   A2GS 0.7 01/29/2020   BETS 0.9 01/29/2020   GAMS 0.8 01/29/2020   MSPIKE Not Observed 01/29/2020   SPEI Comment 01/01/2020     Chemistry      Component Value Date/Time   NA 141 02/25/2020 1319   K 4.4 02/25/2020 1319   CL 105 02/25/2020 1319   CO2 30 02/25/2020 1319   BUN 29 (H) 02/25/2020 1319   CREATININE 1.78 (H) 02/25/2020 1319      Component Value Date/Time   CALCIUM 9.4 02/25/2020 1319   ALKPHOS 54 02/25/2020 1319   AST 15 02/25/2020 1319   ALT 16 02/25/2020 1319   BILITOT 0.7 02/25/2020 1319       Impression and Plan: Shawn Bauer is a very pleasant 86 yo caucasian gentleman with IgG kappa myeloma.  His big problem has been the kappa light chain being so elevated.  He has some chronic renal insufficiency which may or may not be from the light chain.  Currently, the renal function is stable.  We will see what his light chains are.  Hopefully, the kappa light chains will be improved a little bit.  We will continue to follow him monthly.  He will get his weekly Velcade for the next 3 weeks.     [name], MD 6/3/20212:12 PM

## 2020-02-25 NOTE — Progress Notes (Signed)
Ok to treat with Creatinine of 1.78 per Dr. Marin Olp

## 2020-02-25 NOTE — Patient Instructions (Signed)
Bortezomib injection What is this medicine? BORTEZOMIB (bor TEZ oh mib) is a medicine that targets proteins in cancer cells and stops the cancer cells from growing. It is used to treat multiple myeloma and mantle-cell lymphoma. This medicine may be used for other purposes; ask your health care provider or pharmacist if you have questions. COMMON BRAND NAME(S): Velcade What should I tell my health care provider before I take this medicine? They need to know if you have any of these conditions:  diabetes  heart disease  irregular heartbeat  liver disease  on hemodialysis  low blood counts, like low white blood cells, platelets, or hemoglobin  peripheral neuropathy  taking medicine for blood pressure  an unusual or allergic reaction to bortezomib, mannitol, boron, other medicines, foods, dyes, or preservatives  pregnant or trying to get pregnant  breast-feeding How should I use this medicine? This medicine is for injection into a vein or for injection under the skin. It is given by a health care professional in a hospital or clinic setting. Talk to your pediatrician regarding the use of this medicine in children. Special care may be needed. Overdosage: If you think you have taken too much of this medicine contact a poison control center or emergency room at once. NOTE: This medicine is only for you. Do not share this medicine with others. What if I miss a dose? It is important not to miss your dose. Call your doctor or health care professional if you are unable to keep an appointment. What may interact with this medicine? This medicine may interact with the following medications:  ketoconazole  rifampin  ritonavir  St. John's Wort This list may not describe all possible interactions. Give your health care provider a list of all the medicines, herbs, non-prescription drugs, or dietary supplements you use. Also tell them if you smoke, drink alcohol, or use illegal drugs. Some  items may interact with your medicine. What should I watch for while using this medicine? You may get drowsy or dizzy. Do not drive, use machinery, or do anything that needs mental alertness until you know how this medicine affects you. Do not stand or sit up quickly, especially if you are an older patient. This reduces the risk of dizzy or fainting spells. In some cases, you may be given additional medicines to help with side effects. Follow all directions for their use. Call your doctor or health care professional for advice if you get a fever, chills or sore throat, or other symptoms of a cold or flu. Do not treat yourself. This drug decreases your body's ability to fight infections. Try to avoid being around people who are sick. This medicine may increase your risk to bruise or bleed. Call your doctor or health care professional if you notice any unusual bleeding. You may need blood work done while you are taking this medicine. In some patients, this medicine may cause a serious brain infection that may cause death. If you have any problems seeing, thinking, speaking, walking, or standing, tell your doctor right away. If you cannot reach your doctor, urgently seek other source of medical care. Check with your doctor or health care professional if you get an attack of severe diarrhea, nausea and vomiting, or if you sweat a lot. The loss of too much body fluid can make it dangerous for you to take this medicine. Do not become pregnant while taking this medicine or for at least 7 months after stopping it. Women should inform their doctor   if they wish to become pregnant or think they might be pregnant. Men should not father a child while taking this medicine and for at least 4 months after stopping it. There is a potential for serious side effects to an unborn child. Talk to your health care professional or pharmacist for more information. Do not breast-feed an infant while taking this medicine or for 2  months after stopping it. This medicine may interfere with the ability to have a child. You should talk with your doctor or health care professional if you are concerned about your fertility. What side effects may I notice from receiving this medicine? Side effects that you should report to your doctor or health care professional as soon as possible:  allergic reactions like skin rash, itching or hives, swelling of the face, lips, or tongue  breathing problems  changes in hearing  changes in vision  fast, irregular heartbeat  feeling faint or lightheaded, falls  pain, tingling, numbness in the hands or feet  right upper belly pain  seizures  swelling of the ankles, feet, hands  unusual bleeding or bruising  unusually weak or tired  vomiting  yellowing of the eyes or skin Side effects that usually do not require medical attention (report to your doctor or health care professional if they continue or are bothersome):  changes in emotions or moods  constipation  diarrhea  loss of appetite  headache  irritation at site where injected  nausea This list may not describe all possible side effects. Call your doctor for medical advice about side effects. You may report side effects to FDA at 1-800-FDA-1088. Where should I keep my medicine? This drug is given in a hospital or clinic and will not be stored at home. NOTE: This sheet is a summary. It may not cover all possible information. If you have questions about this medicine, talk to your doctor, pharmacist, or health care provider.  2020 Elsevier/Gold Standard (2018-01-20 16:29:31)  

## 2020-02-26 LAB — KAPPA/LAMBDA LIGHT CHAINS
Kappa free light chain: 283 mg/L — ABNORMAL HIGH (ref 3.3–19.4)
Kappa, lambda light chain ratio: 30.76 — ABNORMAL HIGH (ref 0.26–1.65)
Lambda free light chains: 9.2 mg/L (ref 5.7–26.3)

## 2020-02-26 LAB — IGG, IGA, IGM
IgA: 113 mg/dL (ref 61–437)
IgG (Immunoglobin G), Serum: 712 mg/dL (ref 603–1613)
IgM (Immunoglobulin M), Srm: 21 mg/dL (ref 15–143)

## 2020-02-26 LAB — LACTATE DEHYDROGENASE: LDH: 193 U/L — ABNORMAL HIGH (ref 98–192)

## 2020-02-26 NOTE — Progress Notes (Signed)
Pharmacist Chemotherapy Monitoring - Follow Up Assessment    I verify that I have reviewed each item in the below checklist:  . Regimen for the patient is scheduled for the appropriate day and plan matches scheduled date. Marland Kitchen Appropriate non-routine labs are ordered dependent on drug ordered. . If applicable, additional medications reviewed and ordered per protocol based on lifetime cumulative doses and/or treatment regimen.   Plan for follow-up and/or issues identified: Yes . I-vent associated with next due treatment: Yes . MD and/or nursing notified: Yes   Kennith Center, Pharm.D., CPP 02/26/2020@3 :57 PM

## 2020-03-01 LAB — IMMUNOFIXATION REFLEX, SERUM
IgA: 126 mg/dL (ref 61–437)
IgG (Immunoglobin G), Serum: 755 mg/dL (ref 603–1613)
IgM (Immunoglobulin M), Srm: 28 mg/dL (ref 15–143)

## 2020-03-01 LAB — PROTEIN ELECTROPHORESIS, SERUM, WITH REFLEX
A/G Ratio: 1.6 (ref 0.7–1.7)
Albumin ELP: 3.6 g/dL (ref 2.9–4.4)
Alpha-1-Globulin: 0.2 g/dL (ref 0.0–0.4)
Alpha-2-Globulin: 0.7 g/dL (ref 0.4–1.0)
Beta Globulin: 0.8 g/dL (ref 0.7–1.3)
Gamma Globulin: 0.6 g/dL (ref 0.4–1.8)
Globulin, Total: 2.3 g/dL (ref 2.2–3.9)
SPEP Interpretation: 0
Total Protein ELP: 5.9 g/dL — ABNORMAL LOW (ref 6.0–8.5)

## 2020-03-04 ENCOUNTER — Inpatient Hospital Stay: Payer: Medicare Other

## 2020-03-04 ENCOUNTER — Other Ambulatory Visit: Payer: Self-pay

## 2020-03-04 ENCOUNTER — Other Ambulatory Visit: Payer: Self-pay | Admitting: *Deleted

## 2020-03-04 VITALS — BP 146/60 | HR 65 | Resp 18

## 2020-03-04 DIAGNOSIS — Z5111 Encounter for antineoplastic chemotherapy: Secondary | ICD-10-CM | POA: Diagnosis not present

## 2020-03-04 DIAGNOSIS — C9000 Multiple myeloma not having achieved remission: Secondary | ICD-10-CM

## 2020-03-04 LAB — CMP (CANCER CENTER ONLY)
ALT: 13 U/L (ref 0–44)
AST: 13 U/L — ABNORMAL LOW (ref 15–41)
Albumin: 3.7 g/dL (ref 3.5–5.0)
Alkaline Phosphatase: 42 U/L (ref 38–126)
Anion gap: 4 — ABNORMAL LOW (ref 5–15)
BUN: 29 mg/dL — ABNORMAL HIGH (ref 8–23)
CO2: 31 mmol/L (ref 22–32)
Calcium: 9.3 mg/dL (ref 8.9–10.3)
Chloride: 104 mmol/L (ref 98–111)
Creatinine: 1.72 mg/dL — ABNORMAL HIGH (ref 0.61–1.24)
GFR, Est AFR Am: 42 mL/min — ABNORMAL LOW (ref >60)
GFR, Estimated: 36 mL/min — ABNORMAL LOW (ref >60)
Glucose, Bld: 108 mg/dL — ABNORMAL HIGH (ref 70–99)
Potassium: 4.5 mmol/L (ref 3.5–5.1)
Sodium: 139 mmol/L (ref 135–145)
Total Bilirubin: 0.7 mg/dL (ref 0.3–1.2)
Total Protein: 5.8 g/dL — ABNORMAL LOW (ref 6.5–8.1)

## 2020-03-04 LAB — CBC WITH DIFFERENTIAL (CANCER CENTER ONLY)
Abs Immature Granulocytes: 0.05 10*3/uL (ref 0.00–0.07)
Basophils Absolute: 0.1 10*3/uL (ref 0.0–0.1)
Basophils Relative: 1 %
Eosinophils Absolute: 0.3 10*3/uL (ref 0.0–0.5)
Eosinophils Relative: 2 %
HCT: 41.2 % (ref 39.0–52.0)
Hemoglobin: 13.4 g/dL (ref 13.0–17.0)
Immature Granulocytes: 1 %
Lymphocytes Relative: 22 %
Lymphs Abs: 2.3 10*3/uL (ref 0.7–4.0)
MCH: 32.8 pg (ref 26.0–34.0)
MCHC: 32.5 g/dL (ref 30.0–36.0)
MCV: 101 fL — ABNORMAL HIGH (ref 80.0–100.0)
Monocytes Absolute: 1 10*3/uL (ref 0.1–1.0)
Monocytes Relative: 10 %
Neutro Abs: 6.7 10*3/uL (ref 1.7–7.7)
Neutrophils Relative %: 64 %
Platelet Count: 184 10*3/uL (ref 150–400)
RBC: 4.08 MIL/uL — ABNORMAL LOW (ref 4.22–5.81)
RDW: 12.7 % (ref 11.5–15.5)
WBC Count: 10.3 10*3/uL (ref 4.0–10.5)
nRBC: 0 % (ref 0.0–0.2)

## 2020-03-04 MED ORDER — DEXAMETHASONE 4 MG PO TABS
20.0000 mg | ORAL_TABLET | ORAL | Status: DC
  Administered 2020-03-04: 20 mg via ORAL

## 2020-03-04 MED ORDER — PROCHLORPERAZINE MALEATE 10 MG PO TABS
10.0000 mg | ORAL_TABLET | Freq: Once | ORAL | Status: AC
  Administered 2020-03-04: 10 mg via ORAL

## 2020-03-04 MED ORDER — DEXAMETHASONE 4 MG PO TABS
ORAL_TABLET | ORAL | Status: AC
  Filled 2020-03-04: qty 5

## 2020-03-04 MED ORDER — BORTEZOMIB CHEMO SQ INJECTION 3.5 MG (2.5MG/ML)
1.3000 mg/m2 | Freq: Once | INTRAMUSCULAR | Status: AC
  Administered 2020-03-04: 2.25 mg via SUBCUTANEOUS
  Filled 2020-03-04: qty 0.9

## 2020-03-04 MED ORDER — PROCHLORPERAZINE MALEATE 10 MG PO TABS
ORAL_TABLET | ORAL | Status: AC
  Filled 2020-03-04: qty 1

## 2020-03-04 NOTE — Patient Instructions (Signed)
Bortezomib injection What is this medicine? BORTEZOMIB (bor TEZ oh mib) is a medicine that targets proteins in cancer cells and stops the cancer cells from growing. It is used to treat multiple myeloma and mantle-cell lymphoma. This medicine may be used for other purposes; ask your health care provider or pharmacist if you have questions. COMMON BRAND NAME(S): Velcade What should I tell my health care provider before I take this medicine? They need to know if you have any of these conditions:  diabetes  heart disease  irregular heartbeat  liver disease  on hemodialysis  low blood counts, like low white blood cells, platelets, or hemoglobin  peripheral neuropathy  taking medicine for blood pressure  an unusual or allergic reaction to bortezomib, mannitol, boron, other medicines, foods, dyes, or preservatives  pregnant or trying to get pregnant  breast-feeding How should I use this medicine? This medicine is for injection into a vein or for injection under the skin. It is given by a health care professional in a hospital or clinic setting. Talk to your pediatrician regarding the use of this medicine in children. Special care may be needed. Overdosage: If you think you have taken too much of this medicine contact a poison control center or emergency room at once. NOTE: This medicine is only for you. Do not share this medicine with others. What if I miss a dose? It is important not to miss your dose. Call your doctor or health care professional if you are unable to keep an appointment. What may interact with this medicine? This medicine may interact with the following medications:  ketoconazole  rifampin  ritonavir  St. John's Wort This list may not describe all possible interactions. Give your health care provider a list of all the medicines, herbs, non-prescription drugs, or dietary supplements you use. Also tell them if you smoke, drink alcohol, or use illegal drugs. Some  items may interact with your medicine. What should I watch for while using this medicine? You may get drowsy or dizzy. Do not drive, use machinery, or do anything that needs mental alertness until you know how this medicine affects you. Do not stand or sit up quickly, especially if you are an older patient. This reduces the risk of dizzy or fainting spells. In some cases, you may be given additional medicines to help with side effects. Follow all directions for their use. Call your doctor or health care professional for advice if you get a fever, chills or sore throat, or other symptoms of a cold or flu. Do not treat yourself. This drug decreases your body's ability to fight infections. Try to avoid being around people who are sick. This medicine may increase your risk to bruise or bleed. Call your doctor or health care professional if you notice any unusual bleeding. You may need blood work done while you are taking this medicine. In some patients, this medicine may cause a serious brain infection that may cause death. If you have any problems seeing, thinking, speaking, walking, or standing, tell your doctor right away. If you cannot reach your doctor, urgently seek other source of medical care. Check with your doctor or health care professional if you get an attack of severe diarrhea, nausea and vomiting, or if you sweat a lot. The loss of too much body fluid can make it dangerous for you to take this medicine. Do not become pregnant while taking this medicine or for at least 7 months after stopping it. Women should inform their doctor   if they wish to become pregnant or think they might be pregnant. Men should not father a child while taking this medicine and for at least 4 months after stopping it. There is a potential for serious side effects to an unborn child. Talk to your health care professional or pharmacist for more information. Do not breast-feed an infant while taking this medicine or for 2  months after stopping it. This medicine may interfere with the ability to have a child. You should talk with your doctor or health care professional if you are concerned about your fertility. What side effects may I notice from receiving this medicine? Side effects that you should report to your doctor or health care professional as soon as possible:  allergic reactions like skin rash, itching or hives, swelling of the face, lips, or tongue  breathing problems  changes in hearing  changes in vision  fast, irregular heartbeat  feeling faint or lightheaded, falls  pain, tingling, numbness in the hands or feet  right upper belly pain  seizures  swelling of the ankles, feet, hands  unusual bleeding or bruising  unusually weak or tired  vomiting  yellowing of the eyes or skin Side effects that usually do not require medical attention (report to your doctor or health care professional if they continue or are bothersome):  changes in emotions or moods  constipation  diarrhea  loss of appetite  headache  irritation at site where injected  nausea This list may not describe all possible side effects. Call your doctor for medical advice about side effects. You may report side effects to FDA at 1-800-FDA-1088. Where should I keep my medicine? This drug is given in a hospital or clinic and will not be stored at home. NOTE: This sheet is a summary. It may not cover all possible information. If you have questions about this medicine, talk to your doctor, pharmacist, or health care provider.  2020 Elsevier/Gold Standard (2018-01-20 16:29:31)  

## 2020-03-10 ENCOUNTER — Other Ambulatory Visit: Payer: Self-pay | Admitting: *Deleted

## 2020-03-10 DIAGNOSIS — C9000 Multiple myeloma not having achieved remission: Secondary | ICD-10-CM

## 2020-03-11 ENCOUNTER — Other Ambulatory Visit: Payer: Self-pay

## 2020-03-11 ENCOUNTER — Inpatient Hospital Stay: Payer: Medicare Other

## 2020-03-11 VITALS — BP 119/69 | HR 98 | Temp 97.5°F | Resp 17

## 2020-03-11 DIAGNOSIS — C9000 Multiple myeloma not having achieved remission: Secondary | ICD-10-CM

## 2020-03-11 DIAGNOSIS — Z5111 Encounter for antineoplastic chemotherapy: Secondary | ICD-10-CM | POA: Diagnosis not present

## 2020-03-11 LAB — CMP (CANCER CENTER ONLY)
ALT: 13 U/L (ref 0–44)
AST: 13 U/L — ABNORMAL LOW (ref 15–41)
Albumin: 3.9 g/dL (ref 3.5–5.0)
Alkaline Phosphatase: 51 U/L (ref 38–126)
Anion gap: 7 (ref 5–15)
BUN: 26 mg/dL — ABNORMAL HIGH (ref 8–23)
CO2: 30 mmol/L (ref 22–32)
Calcium: 9.7 mg/dL (ref 8.9–10.3)
Chloride: 104 mmol/L (ref 98–111)
Creatinine: 1.83 mg/dL — ABNORMAL HIGH (ref 0.61–1.24)
GFR, Est AFR Am: 39 mL/min — ABNORMAL LOW (ref >60)
GFR, Estimated: 33 mL/min — ABNORMAL LOW (ref >60)
Glucose, Bld: 152 mg/dL — ABNORMAL HIGH (ref 70–99)
Potassium: 5.2 mmol/L — ABNORMAL HIGH (ref 3.5–5.1)
Sodium: 141 mmol/L (ref 135–145)
Total Bilirubin: 0.7 mg/dL (ref 0.3–1.2)
Total Protein: 6.3 g/dL — ABNORMAL LOW (ref 6.5–8.1)

## 2020-03-11 LAB — CBC WITH DIFFERENTIAL (CANCER CENTER ONLY)
Abs Immature Granulocytes: 0.07 10*3/uL (ref 0.00–0.07)
Basophils Absolute: 0.1 10*3/uL (ref 0.0–0.1)
Basophils Relative: 1 %
Eosinophils Absolute: 0.2 10*3/uL (ref 0.0–0.5)
Eosinophils Relative: 2 %
HCT: 44.8 % (ref 39.0–52.0)
Hemoglobin: 14.5 g/dL (ref 13.0–17.0)
Immature Granulocytes: 1 %
Lymphocytes Relative: 26 %
Lymphs Abs: 2.8 10*3/uL (ref 0.7–4.0)
MCH: 32.7 pg (ref 26.0–34.0)
MCHC: 32.4 g/dL (ref 30.0–36.0)
MCV: 100.9 fL — ABNORMAL HIGH (ref 80.0–100.0)
Monocytes Absolute: 0.8 10*3/uL (ref 0.1–1.0)
Monocytes Relative: 8 %
Neutro Abs: 7 10*3/uL (ref 1.7–7.7)
Neutrophils Relative %: 62 %
Platelet Count: 210 10*3/uL (ref 150–400)
RBC: 4.44 MIL/uL (ref 4.22–5.81)
RDW: 12.7 % (ref 11.5–15.5)
WBC Count: 10.9 10*3/uL — ABNORMAL HIGH (ref 4.0–10.5)
nRBC: 0 % (ref 0.0–0.2)

## 2020-03-11 MED ORDER — DEXAMETHASONE 4 MG PO TABS
20.0000 mg | ORAL_TABLET | ORAL | Status: DC
  Administered 2020-03-11: 20 mg via ORAL

## 2020-03-11 MED ORDER — DEXAMETHASONE 4 MG PO TABS
ORAL_TABLET | ORAL | Status: AC
  Filled 2020-03-11: qty 5

## 2020-03-11 MED ORDER — PROCHLORPERAZINE MALEATE 10 MG PO TABS
ORAL_TABLET | ORAL | Status: AC
  Filled 2020-03-11: qty 1

## 2020-03-11 MED ORDER — BORTEZOMIB CHEMO SQ INJECTION 3.5 MG (2.5MG/ML)
1.3000 mg/m2 | Freq: Once | INTRAMUSCULAR | Status: AC
  Administered 2020-03-11: 2.25 mg via SUBCUTANEOUS
  Filled 2020-03-11: qty 0.9

## 2020-03-11 MED ORDER — PROCHLORPERAZINE MALEATE 10 MG PO TABS
10.0000 mg | ORAL_TABLET | Freq: Once | ORAL | Status: AC
  Administered 2020-03-11: 10 mg via ORAL

## 2020-03-11 NOTE — Patient Instructions (Signed)
Bortezomib injection What is this medicine? BORTEZOMIB (bor TEZ oh mib) is a medicine that targets proteins in cancer cells and stops the cancer cells from growing. It is used to treat multiple myeloma and mantle-cell lymphoma. This medicine may be used for other purposes; ask your health care provider or pharmacist if you have questions. COMMON BRAND NAME(S): Velcade What should I tell my health care provider before I take this medicine? They need to know if you have any of these conditions:  diabetes  heart disease  irregular heartbeat  liver disease  on hemodialysis  low blood counts, like low white blood cells, platelets, or hemoglobin  peripheral neuropathy  taking medicine for blood pressure  an unusual or allergic reaction to bortezomib, mannitol, boron, other medicines, foods, dyes, or preservatives  pregnant or trying to get pregnant  breast-feeding How should I use this medicine? This medicine is for injection into a vein or for injection under the skin. It is given by a health care professional in a hospital or clinic setting. Talk to your pediatrician regarding the use of this medicine in children. Special care may be needed. Overdosage: If you think you have taken too much of this medicine contact a poison control center or emergency room at once. NOTE: This medicine is only for you. Do not share this medicine with others. What if I miss a dose? It is important not to miss your dose. Call your doctor or health care professional if you are unable to keep an appointment. What may interact with this medicine? This medicine may interact with the following medications:  ketoconazole  rifampin  ritonavir  St. John's Wort This list may not describe all possible interactions. Give your health care provider a list of all the medicines, herbs, non-prescription drugs, or dietary supplements you use. Also tell them if you smoke, drink alcohol, or use illegal drugs. Some  items may interact with your medicine. What should I watch for while using this medicine? You may get drowsy or dizzy. Do not drive, use machinery, or do anything that needs mental alertness until you know how this medicine affects you. Do not stand or sit up quickly, especially if you are an older patient. This reduces the risk of dizzy or fainting spells. In some cases, you may be given additional medicines to help with side effects. Follow all directions for their use. Call your doctor or health care professional for advice if you get a fever, chills or sore throat, or other symptoms of a cold or flu. Do not treat yourself. This drug decreases your body's ability to fight infections. Try to avoid being around people who are sick. This medicine may increase your risk to bruise or bleed. Call your doctor or health care professional if you notice any unusual bleeding. You may need blood work done while you are taking this medicine. In some patients, this medicine may cause a serious brain infection that may cause death. If you have any problems seeing, thinking, speaking, walking, or standing, tell your doctor right away. If you cannot reach your doctor, urgently seek other source of medical care. Check with your doctor or health care professional if you get an attack of severe diarrhea, nausea and vomiting, or if you sweat a lot. The loss of too much body fluid can make it dangerous for you to take this medicine. Do not become pregnant while taking this medicine or for at least 7 months after stopping it. Women should inform their doctor   if they wish to become pregnant or think they might be pregnant. Men should not father a child while taking this medicine and for at least 4 months after stopping it. There is a potential for serious side effects to an unborn child. Talk to your health care professional or pharmacist for more information. Do not breast-feed an infant while taking this medicine or for 2  months after stopping it. This medicine may interfere with the ability to have a child. You should talk with your doctor or health care professional if you are concerned about your fertility. What side effects may I notice from receiving this medicine? Side effects that you should report to your doctor or health care professional as soon as possible:  allergic reactions like skin rash, itching or hives, swelling of the face, lips, or tongue  breathing problems  changes in hearing  changes in vision  fast, irregular heartbeat  feeling faint or lightheaded, falls  pain, tingling, numbness in the hands or feet  right upper belly pain  seizures  swelling of the ankles, feet, hands  unusual bleeding or bruising  unusually weak or tired  vomiting  yellowing of the eyes or skin Side effects that usually do not require medical attention (report to your doctor or health care professional if they continue or are bothersome):  changes in emotions or moods  constipation  diarrhea  loss of appetite  headache  irritation at site where injected  nausea This list may not describe all possible side effects. Call your doctor for medical advice about side effects. You may report side effects to FDA at 1-800-FDA-1088. Where should I keep my medicine? This drug is given in a hospital or clinic and will not be stored at home. NOTE: This sheet is a summary. It may not cover all possible information. If you have questions about this medicine, talk to your doctor, pharmacist, or health care provider.  2020 Elsevier/Gold Standard (2018-01-20 16:29:31)  

## 2020-03-11 NOTE — Progress Notes (Signed)
Cbc and cmet reviewed by MD, ok to treat despite counts ? ?

## 2020-03-25 ENCOUNTER — Telehealth: Payer: Self-pay | Admitting: *Deleted

## 2020-03-25 ENCOUNTER — Other Ambulatory Visit: Payer: Self-pay | Admitting: Hematology & Oncology

## 2020-03-25 ENCOUNTER — Inpatient Hospital Stay (HOSPITAL_BASED_OUTPATIENT_CLINIC_OR_DEPARTMENT_OTHER): Payer: Medicare Other | Admitting: Hematology & Oncology

## 2020-03-25 ENCOUNTER — Inpatient Hospital Stay: Payer: Medicare Other

## 2020-03-25 ENCOUNTER — Telehealth: Payer: Self-pay | Admitting: Cardiovascular Disease

## 2020-03-25 ENCOUNTER — Encounter: Payer: Self-pay | Admitting: Hematology & Oncology

## 2020-03-25 ENCOUNTER — Inpatient Hospital Stay: Payer: Medicare Other | Attending: Family

## 2020-03-25 VITALS — BP 112/65 | HR 44 | Temp 98.6°F | Resp 18 | Wt 141.0 lb

## 2020-03-25 DIAGNOSIS — I4892 Unspecified atrial flutter: Secondary | ICD-10-CM | POA: Diagnosis not present

## 2020-03-25 DIAGNOSIS — Z79899 Other long term (current) drug therapy: Secondary | ICD-10-CM | POA: Insufficient documentation

## 2020-03-25 DIAGNOSIS — N189 Chronic kidney disease, unspecified: Secondary | ICD-10-CM | POA: Diagnosis not present

## 2020-03-25 DIAGNOSIS — Z5111 Encounter for antineoplastic chemotherapy: Secondary | ICD-10-CM | POA: Diagnosis not present

## 2020-03-25 DIAGNOSIS — I25119 Atherosclerotic heart disease of native coronary artery with unspecified angina pectoris: Secondary | ICD-10-CM | POA: Diagnosis not present

## 2020-03-25 DIAGNOSIS — C9000 Multiple myeloma not having achieved remission: Secondary | ICD-10-CM

## 2020-03-25 DIAGNOSIS — Z7901 Long term (current) use of anticoagulants: Secondary | ICD-10-CM | POA: Diagnosis not present

## 2020-03-25 DIAGNOSIS — Z7982 Long term (current) use of aspirin: Secondary | ICD-10-CM | POA: Diagnosis not present

## 2020-03-25 LAB — CMP (CANCER CENTER ONLY)
ALT: 11 U/L (ref 0–44)
AST: 14 U/L — ABNORMAL LOW (ref 15–41)
Albumin: 3.9 g/dL (ref 3.5–5.0)
Alkaline Phosphatase: 41 U/L (ref 38–126)
Anion gap: 5 (ref 5–15)
BUN: 22 mg/dL (ref 8–23)
CO2: 30 mmol/L (ref 22–32)
Calcium: 9.2 mg/dL (ref 8.9–10.3)
Chloride: 107 mmol/L (ref 98–111)
Creatinine: 1.71 mg/dL — ABNORMAL HIGH (ref 0.61–1.24)
GFR, Est AFR Am: 42 mL/min — ABNORMAL LOW (ref >60)
GFR, Estimated: 36 mL/min — ABNORMAL LOW (ref >60)
Glucose, Bld: 106 mg/dL — ABNORMAL HIGH (ref 70–99)
Potassium: 4.7 mmol/L (ref 3.5–5.1)
Sodium: 142 mmol/L (ref 135–145)
Total Bilirubin: 0.8 mg/dL (ref 0.3–1.2)
Total Protein: 5.9 g/dL — ABNORMAL LOW (ref 6.5–8.1)

## 2020-03-25 LAB — CBC WITH DIFFERENTIAL (CANCER CENTER ONLY)
Abs Immature Granulocytes: 0.03 10*3/uL (ref 0.00–0.07)
Basophils Absolute: 0.1 10*3/uL (ref 0.0–0.1)
Basophils Relative: 1 %
Eosinophils Absolute: 0.2 10*3/uL (ref 0.0–0.5)
Eosinophils Relative: 2 %
HCT: 39.8 % (ref 39.0–52.0)
Hemoglobin: 12.8 g/dL — ABNORMAL LOW (ref 13.0–17.0)
Immature Granulocytes: 0 %
Lymphocytes Relative: 25 %
Lymphs Abs: 1.8 10*3/uL (ref 0.7–4.0)
MCH: 32.7 pg (ref 26.0–34.0)
MCHC: 32.2 g/dL (ref 30.0–36.0)
MCV: 101.8 fL — ABNORMAL HIGH (ref 80.0–100.0)
Monocytes Absolute: 0.6 10*3/uL (ref 0.1–1.0)
Monocytes Relative: 9 %
Neutro Abs: 4.7 10*3/uL (ref 1.7–7.7)
Neutrophils Relative %: 63 %
Platelet Count: 211 10*3/uL (ref 150–400)
RBC: 3.91 MIL/uL — ABNORMAL LOW (ref 4.22–5.81)
RDW: 13.2 % (ref 11.5–15.5)
WBC Count: 7.4 10*3/uL (ref 4.0–10.5)
nRBC: 0 % (ref 0.0–0.2)

## 2020-03-25 NOTE — Telephone Encounter (Signed)
I spoke with Afib clinic and patient can see Malka So, PA on 7/6 at 11:00. I made Erline Levine at Dr Antonieta Pert office aware of appointment. She will have EKG scanned in. I spoke with patient and his wife and gave then appointment information. Parking code and phone number for afib clinic provided.

## 2020-03-25 NOTE — Telephone Encounter (Signed)
    Stacy from Dr. Antonieta Pert office called, she said per Dr. Marin Olp pt needs to be seen due to new onset of Aflutter, Dr. Burt Knack and all APP first available is on 08/10 she said the pt needs to be seen sooner than that.

## 2020-03-25 NOTE — Telephone Encounter (Signed)
Per Dr. Marin Olp, I called Dr. Honor Junes office to inform them that patient had new onset of Aflutter in the office today. EKG performed. Dr. Burt Knack is on vacation until July 12th. The front office is going to see when they can schedule him with another MD or PA. They will call him with the appointment.

## 2020-03-25 NOTE — Telephone Encounter (Signed)
Patient has been scheduled for next Tuesday, July 6th at the [facility]. They will call him with directions and time.

## 2020-03-25 NOTE — Progress Notes (Signed)
Hematology and Oncology Follow Up Visit  Shawn Bauer [facility] [id number] Dec 27, 1933 86 y.o. 03/25/2020   Principle Diagnosis:  IgG Kappa myeloma -- high risk due to t(4:21)  Current Therapy:   Velcade/Decadron -- started 10/09/2019, s/p cycle #6   Interim History:  Shawn Bauer is here today for follow-up and treatment.  Unfortunately, when we are examining him, it sounded like his heart was going quite slow.  It also sounds as of the heart was somewhat irregular.  We got an EKG on him.  We found that he was in atrial flutter with a 4: 1 AV conduction.  I went across the hall to see one of our wonderful cardiologist.  He recommended to get him on anticoagulation.  Shawn Bauer does see a regular cardiologist-Shawn Bauer-.  We will get hold of Shawn Bauer to let him know what is going on.  As far as the myeloma is concerned, he is doing pretty good with this.  Back in early June, his M spike was not found.  His IgG level was 712 mg/dL.  The kappa light chain was 28.3.  He has had no fever.  He has had no chest pain.  He has no shortness of breath.   There is no bleeding.    Unfortunately, his wife has fallen in.  She broke her ribs on the left side and also fractured her scapula.  He is trying to help her.  His appetite is doing pretty well.  He has had no problems with bowels or bladder.  Overall, his performance  status is ECOG 1.    Medications:  Allergies as of 03/25/2020   No Known Allergies     Medication List       Accurate as of March 25, 2020  2:12 PM. If you have any questions, ask your nurse or doctor.        aspirin 81 MG tablet Take 81 mg by mouth daily.   budesonide-formoterol 160-4.5 MCG/ACT inhaler Commonly known as: SYMBICORT Inhale 2 puffs into the lungs 2 (two) times daily.   cyanocobalamin 1000 MCG tablet Take 1,000 mcg by mouth daily.   lovastatin 40 MG tablet Commonly known as: MEVACOR Take 2 tablets once a day   metoprolol succinate 50 MG 24 hr  tablet Commonly known as: TOPROL-XL Take 50 mg by mouth daily. Patient taking 1/2 tab in the morning and 1/2 tab in the evening. Total 50 mg per day. Take with or immediately following a meal.   nitroGLYCERIN 0.4 MG SL tablet Commonly known as: NITROSTAT Place 1 tablet (0.4 mg total) under the tongue every 5 (five) minutes as needed for chest pain.   olmesartan 20 MG tablet Commonly known as: BENICAR Take 20 mg by mouth daily.   traMADol 50 MG tablet Commonly known as: ULTRAM Take 50 mg by mouth 2 (two) times daily as needed.       Allergies: No Known Allergies  Past Medical History, Surgical history, Social history, and Family History were reviewed and updated.  Review of Systems: Review of Systems  Constitutional: Negative.   HENT: Negative.   Eyes: Negative.   Respiratory: Negative.   Cardiovascular: Negative.   Gastrointestinal: Negative.   Genitourinary: Negative.   Musculoskeletal: Negative.   Skin: Negative.   Neurological: Negative.   Endo/Heme/Allergies: Negative.   Psychiatric/Behavioral: Negative.      Physical Exam:  weight is 141 lb (64 kg). His oral temperature is 98.6 F (37 C). His blood pressure is 112/65 and  his pulse is 44 (abnormal). His respiration is 18 and oxygen saturation is 100%.   Wt Readings from Last 3 Encounters:  03/25/20 141 lb (64 kg)  02/25/20 138 lb 1.9 oz (62.7 kg)  01/29/20 139 lb (63 kg)   Physical Exam Vitals reviewed.  HENT:     Head: Normocephalic and atraumatic.  Eyes:     Pupils: Pupils are equal, round, and reactive to light.  Cardiovascular:     Rate and Rhythm: Normal rate and regular rhythm.     Heart sounds: Normal heart sounds.  Pulmonary:     Effort: Pulmonary effort is normal.     Breath sounds: Normal breath sounds.  Abdominal:     General: Bowel sounds are normal.     Palpations: Abdomen is soft.  Musculoskeletal:        General: No tenderness or deformity. Normal range of motion.     Cervical back:  Normal range of motion.  Lymphadenopathy:     Cervical: No cervical adenopathy.  Skin:    General: Skin is warm and dry.     Findings: No erythema or rash.  Neurological:     Mental Status: He is alert and oriented to person, place, and time.  Psychiatric:        Behavior: Behavior normal.        Thought Content: Thought content normal.        Judgment: Judgment normal.      Lab Results  Component Value Date   WBC 7.4 03/25/2020   HGB 12.8 (L) 03/25/2020   HCT 39.8 03/25/2020   MCV 101.8 (H) 03/25/2020   PLT 211 03/25/2020   No results found for: FERRITIN, IRON, TIBC, UIBC, IRONPCTSAT Lab Results  Component Value Date   RBC 3.91 (L) 03/25/2020   Lab Results  Component Value Date   KPAFRELGTCHN 283.0 (H) 02/25/2020   LAMBDASER 9.2 02/25/2020   KAPLAMBRATIO 30.76 (H) 02/25/2020   Lab Results  Component Value Date   IGGSERUM 712 02/25/2020   IGGSERUM 755 02/25/2020   IGA 113 02/25/2020   IGA 126 02/25/2020   IGMSERUM 21 02/25/2020   IGMSERUM 28 02/25/2020   Lab Results  Component Value Date   TOTALPROTELP 5.9 (L) 02/25/2020   ALBUMINELP 3.6 02/25/2020   A1GS 0.2 02/25/2020   A2GS 0.7 02/25/2020   BETS 0.8 02/25/2020   GAMS 0.6 02/25/2020   MSPIKE Not Observed 02/25/2020   SPEI Comment 01/01/2020     Chemistry      Component Value Date/Time   NA 142 03/25/2020 1155   K 4.7 03/25/2020 1155   CL 107 03/25/2020 1155   CO2 30 03/25/2020 1155   BUN 22 03/25/2020 1155   CREATININE 1.71 (H) 03/25/2020 1155      Component Value Date/Time   CALCIUM 9.2 03/25/2020 1155   ALKPHOS 41 03/25/2020 1155   AST 14 (L) 03/25/2020 1155   ALT 11 03/25/2020 1155   BILITOT 0.8 03/25/2020 1155       Impression and Plan: Shawn Bauer is a very pleasant 86 yo caucasian gentleman with IgG kappa myeloma.  His big problem has been the kappa light chain being so elevated.  He has some chronic renal insufficiency which may or may not be from the light chain.  Currently, the  renal function is stable.  For right now, our issue clearly is the atrial flutter.  We really have to get this under control.  I am sure cardiology can do this for him.  We will have to see about getting him into see his cardiologist next week.  We will start him on Eliquis at 5 mg p.o. twice daily.  If cardiology wants to stop this later on, that certainly is their prerogative.  We are going to hold on his treatment for 3 weeks.  I want to make sure that his cardiac status gets taken care of initially.  I spent about 45 minutes with him today.  This was certainly a new problem that was very complicated.      [name], MD 7/2/20212:12 PM

## 2020-03-26 LAB — IGG, IGA, IGM
IgA: 107 mg/dL (ref 61–437)
IgG (Immunoglobin G), Serum: 651 mg/dL (ref 603–1613)
IgM (Immunoglobulin M), Srm: 19 mg/dL (ref 15–143)

## 2020-03-29 ENCOUNTER — Encounter (HOSPITAL_COMMUNITY): Payer: Self-pay | Admitting: Physician Assistant

## 2020-03-29 ENCOUNTER — Ambulatory Visit (HOSPITAL_COMMUNITY)
Admission: RE | Admit: 2020-03-29 | Discharge: 2020-03-29 | Disposition: A | Discharge status: Home / Self Care | Payer: Medicare Other | Source: Ambulatory Visit | Attending: Physician Assistant | Admitting: Physician Assistant

## 2020-03-29 ENCOUNTER — Other Ambulatory Visit: Payer: Self-pay

## 2020-03-29 ENCOUNTER — Other Ambulatory Visit: Payer: Self-pay | Admitting: *Deleted

## 2020-03-29 VITALS — BP 126/58 | HR 96 | Ht 69.5 in | Wt 140.2 lb

## 2020-03-29 DIAGNOSIS — Z87891 Personal history of nicotine dependence: Secondary | ICD-10-CM | POA: Diagnosis not present

## 2020-03-29 DIAGNOSIS — E785 Hyperlipidemia, unspecified: Secondary | ICD-10-CM | POA: Insufficient documentation

## 2020-03-29 DIAGNOSIS — I4892 Unspecified atrial flutter: Secondary | ICD-10-CM

## 2020-03-29 DIAGNOSIS — I10 Essential (primary) hypertension: Secondary | ICD-10-CM | POA: Insufficient documentation

## 2020-03-29 DIAGNOSIS — I4891 Unspecified atrial fibrillation: Secondary | ICD-10-CM | POA: Insufficient documentation

## 2020-03-29 DIAGNOSIS — I2510 Atherosclerotic heart disease of native coronary artery without angina pectoris: Secondary | ICD-10-CM | POA: Diagnosis not present

## 2020-03-29 DIAGNOSIS — D6869 Other thrombophilia: Secondary | ICD-10-CM | POA: Diagnosis not present

## 2020-03-29 DIAGNOSIS — Z79899 Other long term (current) drug therapy: Secondary | ICD-10-CM | POA: Diagnosis not present

## 2020-03-29 DIAGNOSIS — I483 Typical atrial flutter: Secondary | ICD-10-CM | POA: Diagnosis not present

## 2020-03-29 DIAGNOSIS — Z7901 Long term (current) use of anticoagulants: Secondary | ICD-10-CM | POA: Diagnosis not present

## 2020-03-29 DIAGNOSIS — C9000 Multiple myeloma not having achieved remission: Secondary | ICD-10-CM | POA: Diagnosis not present

## 2020-03-29 DIAGNOSIS — Z951 Presence of aortocoronary bypass graft: Secondary | ICD-10-CM | POA: Insufficient documentation

## 2020-03-29 DIAGNOSIS — M4802 Spinal stenosis, cervical region: Secondary | ICD-10-CM | POA: Insufficient documentation

## 2020-03-29 DIAGNOSIS — M5030 Other cervical disc degeneration, unspecified cervical region: Secondary | ICD-10-CM | POA: Insufficient documentation

## 2020-03-29 LAB — KAPPA/LAMBDA LIGHT CHAINS
Kappa free light chain: 238.1 mg/L — ABNORMAL HIGH (ref 3.3–19.4)
Kappa, lambda light chain ratio: 28.69 — ABNORMAL HIGH (ref 0.26–1.65)
Lambda free light chains: 8.3 mg/L (ref 5.7–26.3)

## 2020-03-29 LAB — PROTEIN ELECTROPHORESIS, SERUM, WITH REFLEX
A/G Ratio: 1.5 (ref 0.7–1.7)
Albumin ELP: 3.4 g/dL (ref 2.9–4.4)
Alpha-1-Globulin: 0.3 g/dL (ref 0.0–0.4)
Alpha-2-Globulin: 0.6 g/dL (ref 0.4–1.0)
Beta Globulin: 0.7 g/dL (ref 0.7–1.3)
Gamma Globulin: 0.6 g/dL (ref 0.4–1.8)
Globulin, Total: 2.3 g/dL (ref 2.2–3.9)
Total Protein ELP: 5.7 g/dL — ABNORMAL LOW (ref 6.0–8.5)

## 2020-03-29 MED ORDER — APIXABAN 2.5 MG PO TABS: 2.5000 mg | ORAL_TABLET | Freq: Two times a day (BID) | ORAL | 3 refills | Status: DC

## 2020-03-29 MED ORDER — APIXABAN 5 MG PO TABS: 5.0000 mg | ORAL_TABLET | Freq: Two times a day (BID) | ORAL | 1 refills | Status: DC

## 2020-03-29 NOTE — Progress Notes (Signed)
Primary Care Physician: [name], MD Primary Cardiologist: Dr Burt Knack Primary Electrophysiologist: none Referring Physician: Dr Loretha Stapler is a 86 y.o. male with a history of CAD s/p CABG in 2001, HTN, HLD, multiple myeloma, and new onset atrial flutter who presents for consultation in the [facility].  The patient was initially diagnosed with atrial flutter 03/25/20 incidentally at his visit with his hematologist. Patient was started on Eliquis for a CHADS2VASC score of 4. He is unaware of his arrhythmia. He has not picked up his Eliquis Rx yet.   Today, he denies symptoms of palpitations, chest pain, shortness of breath, orthopnea, PND, lower extremity edema, dizziness, presyncope, syncope, snoring, daytime somnolence, bleeding, or neurologic sequela. The patient is tolerating medications without difficulties and is otherwise without complaint today.    Atrial Fibrillation Risk Factors:  he does not have symptoms or diagnosis of sleep apnea. he does not have a history of rheumatic fever.   he has a BMI of Body mass index is 20.41 kg/m.Marland Kitchen Filed Weights   03/29/20 1056  Weight: 63.6 kg    Family History  Problem Relation Age of Onset  . Heart attack Father 91       died  . Heart attack Brother      Atrial Fibrillation Management history:  Previous antiarrhythmic drugs: none Previous cardioversions: none Previous ablations: none CHADS2VASC score: 4 Anticoagulation history: Eliquis   Past Medical History:  Diagnosis Date  . Asthma   . CAD (coronary artery disease)    s/p CABG  . Degenerative disk disease    cervical/spinal stenosis  . Goals of care, counseling/discussion 09/30/2019  . HTN (hypertension)   . Hyperlipidemia   . Kappa light chain myeloma ([place]) 09/30/2019   Past Surgical History:  Procedure Laterality Date  . APPENDECTOMY    . BACK SURGERY    . bilateral inguinal hernia repair    . CORONARY ARTERY BYPASS  GRAFT  2001   after positive exercise treadmill test.  LIMA-LAD, Radial artery to LCx marginal, SVG to second diagonal, SVG to PDA  . TONSILLECTOMY AND ADENOIDECTOMY      Current Outpatient Medications  Medication Sig Dispense Refill  . apixaban (ELIQUIS) 2.5 MG TABS tablet Take 1 tablet (2.5 mg total) by mouth 2 (two) times daily. 60 tablet 3  . budesonide-formoterol (SYMBICORT) 160-4.5 MCG/ACT inhaler Inhale 2 puffs into the lungs 2 (two) times daily.    . cyanocobalamin 1000 MCG tablet Take 1,000 mcg by mouth daily.    Marland Kitchen lovastatin (MEVACOR) 40 MG tablet Take 2 tablets once a day    . metoprolol succinate (TOPROL-XL) 50 MG 24 hr tablet Take 50 mg by mouth daily. Patient taking 1/2 tab in the morning and 1/2 tab in the evening. Total 50 mg per day. Take with or immediately following a meal.    . nitroGLYCERIN (NITROSTAT) 0.4 MG SL tablet Place 1 tablet (0.4 mg total) under the tongue every 5 (five) minutes as needed for chest pain. 25 tablet 11  . olmesartan (BENICAR) 20 MG tablet Take 20 mg by mouth daily.    . traMADol (ULTRAM) 50 MG tablet Take 50 mg by mouth 2 (two) times daily as needed.     No current facility-administered medications for this encounter.    No Known Allergies  Social History   Socioeconomic History  . Marital status: Married    Spouse name: Not on file  . Number of children: 3  .  Years of education: Not on file  . Highest education level: Not on file  Occupational History  . Occupation:  industries  Tobacco Use  . Smoking status: Former Smoker    Types: Cigarettes    Quit date: 08/31/1964    Years since quitting: 55.6  . Smokeless tobacco: Former Network engineer  . Vaping Use: Never used  Substance and Sexual Activity  . Alcohol use: No  . Drug use: No  . Sexual activity: Not on file  Other Topics Concern  . Not on file  Social History Narrative  . Not on file   Social Determinants of Health   Financial Resource Strain:   .  Difficulty of Paying Living Expenses:   Food Insecurity:   . Worried About Charity fundraiser in the Last Year:   . Arboriculturist in the Last Year:   Transportation Needs:   . Film/video editor (Medical):   Marland Kitchen Lack of Transportation (Non-Medical):   Physical Activity:   . Days of Exercise per Week:   . Minutes of Exercise per Session:   Stress:   . Feeling of Stress :   Social Connections:   . Frequency of Communication with Friends and Family:   . Frequency of Social Gatherings with Friends and Family:   . Attends Religious Services:   . Active Member of Clubs or Organizations:   . Attends Archivist Meetings:   Marland Kitchen Marital Status:   Intimate Partner Violence:   . Fear of Current or Ex-Partner:   . Emotionally Abused:   Marland Kitchen Physically Abused:   . Sexually Abused:      ROS- All systems are reviewed and negative except as per the HPI above.  Physical Exam: Vitals:   03/29/20 1056  BP: (!) 126/58  Pulse: 96  Weight: 63.6 kg  Height: 5' 9.5" (1.765 m)    GEN- The patient is well appearing elderly male, alert and oriented x 3 today.   Head- normocephalic, atraumatic Eyes-  Sclera clear, conjunctiva pink Ears- hearing intact Oropharynx- clear Neck- supple  Lungs- Clear to ausculation bilaterally, normal work of breathing Heart- irregular rate and rhythm, no murmurs, rubs or gallops  GI- soft, NT, ND, + BS Extremities- no clubbing, cyanosis, or edema MS- no significant deformity or atrophy Skin- no rash or lesion Psych- euthymic mood, full affect Neuro- strength and sensation are intact  Wt Readings from Last 3 Encounters:  03/29/20 63.6 kg  03/25/20 64 kg  02/25/20 62.7 kg    EKG today demonstrates typical atrial flutter HR 96 with variable conduction, QRS 102, QTc 475  Epic records are reviewed at length today  CHA2DS2-VASc Score = 4  The patient's score is based upon: CHF History: 0 HTN History: 1 Age : 2 Diabetes History: 0 Stroke  History: 0 Vascular Disease History: 1 Gender: 0      ASSESSMENT AND PLAN: 1. Typical atrial flutter The patient's CHA2DS2-VASc score is 4, indicating a 4.8% annual risk of stroke.   General education about atrial flutter discussed and questions answered. We also discussed his stroke risk and the risks and benfefits of anticoagulation. We discussed therapeutic options today including DCCV and ablation. Patient agreeable to referral for flutter ablation.  Decrease Eliquis to 2.5 mg BID (age >56, Cr >1.5). May not be needed long term if flutter ablation is pursued. Will stop ASA for now. Continue Toprol 25 mg BID Check echocardiogram  2. Secondary Hypercoagulable State (ICD10:  D68.69)  The patient is at significant risk for stroke/thromboembolism based upon his CHA2DS2-VASc Score of 4.  Continue Apixaban (Eliquis).   3. HTN Stable, no changes today.  4. CAD S/p CABG  No anginal symptoms.   Follow up with EP to discuss ablation.    [facility] [address] [place], [place] [address] [phone] 03/29/2020 11:33 AM

## 2020-03-29 NOTE — Patient Instructions (Signed)
STOP aspirin  START Eliquis 2.5mg  twice a day  Scheduling will call you for appt with Dr.Taylor

## 2020-03-31 ENCOUNTER — Inpatient Hospital Stay: Payer: Medicare Other

## 2020-04-04 ENCOUNTER — Other Ambulatory Visit: Payer: Self-pay

## 2020-04-04 ENCOUNTER — Ambulatory Visit (HOSPITAL_COMMUNITY)
Admission: RE | Admit: 2020-04-04 | Discharge: 2020-04-04 | Disposition: A | Discharge status: Home / Self Care | Payer: Medicare Other | Source: Ambulatory Visit | Attending: Internal Medicine | Admitting: Internal Medicine

## 2020-04-04 DIAGNOSIS — I10 Essential (primary) hypertension: Secondary | ICD-10-CM | POA: Diagnosis not present

## 2020-04-04 DIAGNOSIS — E785 Hyperlipidemia, unspecified: Secondary | ICD-10-CM | POA: Insufficient documentation

## 2020-04-04 DIAGNOSIS — Z87891 Personal history of nicotine dependence: Secondary | ICD-10-CM | POA: Insufficient documentation

## 2020-04-04 DIAGNOSIS — Z951 Presence of aortocoronary bypass graft: Secondary | ICD-10-CM | POA: Diagnosis not present

## 2020-04-04 DIAGNOSIS — I4891 Unspecified atrial fibrillation: Secondary | ICD-10-CM | POA: Insufficient documentation

## 2020-04-04 DIAGNOSIS — I4892 Unspecified atrial flutter: Secondary | ICD-10-CM | POA: Diagnosis not present

## 2020-04-04 DIAGNOSIS — I2510 Atherosclerotic heart disease of native coronary artery without angina pectoris: Secondary | ICD-10-CM | POA: Diagnosis not present

## 2020-04-04 NOTE — Progress Notes (Signed)
  Echocardiogram 2D Echocardiogram has been performed.  Jannett Celestine 04/04/2020, 11:40 AM

## 2020-04-06 ENCOUNTER — Encounter (HOSPITAL_COMMUNITY): Payer: Self-pay | Admitting: *Deleted

## 2020-04-07 ENCOUNTER — Ambulatory Visit: Payer: Medicare Other

## 2020-04-07 ENCOUNTER — Inpatient Hospital Stay: Payer: Medicare Other

## 2020-04-15 ENCOUNTER — Encounter: Payer: Self-pay | Admitting: Family

## 2020-04-15 ENCOUNTER — Inpatient Hospital Stay: Payer: Medicare Other

## 2020-04-15 ENCOUNTER — Other Ambulatory Visit: Payer: Self-pay

## 2020-04-15 ENCOUNTER — Inpatient Hospital Stay: Payer: Medicare Other | Attending: Family | Admitting: Family

## 2020-04-15 VITALS — BP 138/52 | HR 69 | Temp 98.0°F | Resp 18 | Ht 70.0 in | Wt 141.8 lb

## 2020-04-15 DIAGNOSIS — C9000 Multiple myeloma not having achieved remission: Secondary | ICD-10-CM

## 2020-04-15 DIAGNOSIS — I25119 Atherosclerotic heart disease of native coronary artery with unspecified angina pectoris: Secondary | ICD-10-CM | POA: Diagnosis not present

## 2020-04-15 DIAGNOSIS — Z5111 Encounter for antineoplastic chemotherapy: Secondary | ICD-10-CM | POA: Diagnosis not present

## 2020-04-15 LAB — CBC WITH DIFFERENTIAL (CANCER CENTER ONLY)
Abs Immature Granulocytes: 0.02 10*3/uL (ref 0.00–0.07)
Basophils Absolute: 0.1 10*3/uL (ref 0.0–0.1)
Basophils Relative: 1 %
Eosinophils Absolute: 0.2 10*3/uL (ref 0.0–0.5)
Eosinophils Relative: 2 %
HCT: 41.7 % (ref 39.0–52.0)
Hemoglobin: 13.4 g/dL (ref 13.0–17.0)
Immature Granulocytes: 0 %
Lymphocytes Relative: 22 %
Lymphs Abs: 1.7 10*3/uL (ref 0.7–4.0)
MCH: 32.3 pg (ref 26.0–34.0)
MCHC: 32.1 g/dL (ref 30.0–36.0)
MCV: 100.5 fL — ABNORMAL HIGH (ref 80.0–100.0)
Monocytes Absolute: 0.8 10*3/uL (ref 0.1–1.0)
Monocytes Relative: 11 %
Neutro Abs: 4.9 10*3/uL (ref 1.7–7.7)
Neutrophils Relative %: 64 %
Platelet Count: 169 10*3/uL (ref 150–400)
RBC: 4.15 MIL/uL — ABNORMAL LOW (ref 4.22–5.81)
RDW: 12.9 % (ref 11.5–15.5)
WBC Count: 7.6 10*3/uL (ref 4.0–10.5)
nRBC: 0 % (ref 0.0–0.2)

## 2020-04-15 LAB — CMP (CANCER CENTER ONLY)
ALT: 11 U/L (ref 0–44)
AST: 12 U/L — ABNORMAL LOW (ref 15–41)
Albumin: 3.9 g/dL (ref 3.5–5.0)
Alkaline Phosphatase: 39 U/L (ref 38–126)
Anion gap: 6 (ref 5–15)
BUN: 17 mg/dL (ref 8–23)
CO2: 30 mmol/L (ref 22–32)
Calcium: 9.4 mg/dL (ref 8.9–10.3)
Chloride: 105 mmol/L (ref 98–111)
Creatinine: 1.65 mg/dL — ABNORMAL HIGH (ref 0.61–1.24)
GFR, Est AFR Am: 44 mL/min — ABNORMAL LOW (ref >60)
GFR, Estimated: 38 mL/min — ABNORMAL LOW (ref >60)
Glucose, Bld: 111 mg/dL — ABNORMAL HIGH (ref 70–99)
Potassium: 4.4 mmol/L (ref 3.5–5.1)
Sodium: 141 mmol/L (ref 135–145)
Total Bilirubin: 0.9 mg/dL (ref 0.3–1.2)
Total Protein: 6.1 g/dL — ABNORMAL LOW (ref 6.5–8.1)

## 2020-04-15 MED ORDER — PROCHLORPERAZINE MALEATE 10 MG PO TABS
ORAL_TABLET | ORAL | Status: AC
  Filled 2020-04-15: qty 1

## 2020-04-15 MED ORDER — DENOSUMAB 120 MG/1.7ML ~~LOC~~ SOLN
120.0000 mg | Freq: Once | SUBCUTANEOUS | Status: AC
  Administered 2020-04-15: 120 mg via SUBCUTANEOUS

## 2020-04-15 MED ORDER — DENOSUMAB 120 MG/1.7ML ~~LOC~~ SOLN
SUBCUTANEOUS | Status: AC
  Filled 2020-04-15: qty 1.7

## 2020-04-15 MED ORDER — DEXAMETHASONE 4 MG PO TABS
20.0000 mg | ORAL_TABLET | ORAL | Status: DC
  Administered 2020-04-15: 20 mg via ORAL

## 2020-04-15 MED ORDER — DEXAMETHASONE 4 MG PO TABS
ORAL_TABLET | ORAL | Status: AC
  Filled 2020-04-15: qty 5

## 2020-04-15 MED ORDER — PROCHLORPERAZINE MALEATE 10 MG PO TABS
10.0000 mg | ORAL_TABLET | Freq: Once | ORAL | Status: AC
  Administered 2020-04-15: 10 mg via ORAL

## 2020-04-15 MED ORDER — BORTEZOMIB CHEMO SQ INJECTION 3.5 MG (2.5MG/ML)
1.3000 mg/m2 | Freq: Once | INTRAMUSCULAR | Status: AC
  Administered 2020-04-15: 2.25 mg via SUBCUTANEOUS
  Filled 2020-04-15: qty 0.9

## 2020-04-15 NOTE — Patient Instructions (Signed)
[facility] Discharge Instructions for Patients Receiving Chemotherapy  Today you received the following chemotherapy agents Velcade  To help prevent nausea and vomiting after your treatment, we encourage you to take your nausea medication as prescribed by MD.   If you develop nausea and vomiting that is not controlled by your nausea medication, call the clinic.   BELOW ARE SYMPTOMS THAT SHOULD BE REPORTED IMMEDIATELY:  *FEVER GREATER THAN 100.5 F  *CHILLS WITH OR WITHOUT FEVER  NAUSEA AND VOMITING THAT IS NOT CONTROLLED WITH YOUR NAUSEA MEDICATION  *UNUSUAL SHORTNESS OF BREATH  *UNUSUAL BRUISING OR BLEEDING  TENDERNESS IN MOUTH AND THROAT WITH OR WITHOUT PRESENCE OF ULCERS  *URINARY PROBLEMS  *BOWEL PROBLEMS  UNUSUAL RASH Items with * indicate a potential emergency and should be followed up as soon as possible.  Feel free to call the clinic should you have any questions or concerns. The clinic phone number is [phone].  Please show the [facility] at check-in to the Emergency Department and triage nurse.

## 2020-04-15 NOTE — Addendum Note (Signed)
Addended by: Burney Gauze R on: 04/15/2020 10:43 AM   Modules accepted: Orders

## 2020-04-15 NOTE — Progress Notes (Signed)
Reviewed pt labs with Dr. Marin Olp and pt ok to treat with Creatinine of 1.65

## 2020-04-15 NOTE — Progress Notes (Signed)
Hematology and Oncology Follow Up Visit  Shawn Bauer [facility] [id number] 10-May-1934 86 y.o. 04/15/2020   Principle Diagnosis:  IgG Kappa myeloma -- high risk due to t(4:21)  Current Therapy:        Velcade/Decadron --started01/15/2021, s/p cycle 7   Interim History:  Shawn Bauer is here today for follow-up and treatment. He is doing well and has no complaints at this time.  He was able to see his cardiologist Shawn Bauer and has an appointment with Shawn Bauer on 04/29/2020 to discuss having an ablation for the atrial flutter.  He is doing well on Eliquis and denies any episodes of bleeding. No petechiae.  ECHO showed an EF of 60-65%.  He states that he has no symptoms.   No fever, chills, n/v, cough, rash, dizziness, SOB, chest pain, palpitations, abdominal pain or changes in bowel or bladder habits.  Earlier this month his M-spike was not observed. IgG level was 651 mg/dL and kappa light chains 23.81 mg/dL.  No swelling, tenderness, numbness or tingling in his extremities at this time.  No falls or syncope.  He is eating well and staying hydrated. His weight is stable.   ECOG Performance Status: 1 - Symptomatic but completely ambulatory  Medications:  Allergies as of 04/15/2020   No Known Allergies     Medication List       Accurate as of April 15, 2020 10:10 AM. If you have any questions, ask your nurse or doctor.        apixaban 2.5 MG Tabs tablet Commonly known as: ELIQUIS Take 1 tablet (2.5 mg total) by mouth 2 (two) times daily.   budesonide-formoterol 160-4.5 MCG/ACT inhaler Commonly known as: SYMBICORT Inhale 2 puffs into the lungs 2 (two) times daily.   cyanocobalamin 1000 MCG tablet Take 1,000 mcg by mouth daily.   lovastatin 40 MG tablet Commonly known as: MEVACOR Take 2 tablets once a day   metoprolol succinate 50 MG 24 hr tablet Commonly known as: TOPROL-XL Take 50 mg by mouth daily. Patient taking 1/2 tab in the morning and 1/2 tab in the evening. Total 50  mg per day. Take with or immediately following a meal.   nitroGLYCERIN 0.4 MG SL tablet Commonly known as: NITROSTAT Place 1 tablet (0.4 mg total) under the tongue every 5 (five) minutes as needed for chest pain.   olmesartan 20 MG tablet Commonly known as: BENICAR Take 20 mg by mouth daily.   traMADol 50 MG tablet Commonly known as: ULTRAM Take 50 mg by mouth 2 (two) times daily as needed.       Allergies: No Known Allergies  Past Medical History, Surgical history, Social history, and Family History were reviewed and updated.  Review of Systems: All other 10 point review of systems is negative.   Physical Exam:  height is 5\' 10"  (1.778 m) and weight is 141 lb 12.8 oz (64.3 kg). His oral temperature is 98 F (36.7 C). His blood pressure is 138/52 (abnormal) and his pulse is 69. His respiration is 18 and oxygen saturation is 100%.   Wt Readings from Last 3 Encounters:  04/15/20 141 lb 12.8 oz (64.3 kg)  03/29/20 140 lb 3.2 oz (63.6 kg)  03/25/20 141 lb (64 kg)    Ocular: Sclerae unicteric, pupils equal, round and reactive to light Ear-nose-throat: Oropharynx clear, dentition fair Lymphatic: No cervical or supraclavicular adenopathy Lungs no rales or rhonchi, good excursion bilaterally Heart irregular rate and rhythm, controlled, no murmur appreciated Abd soft, nontender, positive  bowel sounds, no liver or spleen tip palpated on exam, no fluid wave  MSK no focal spinal tenderness, no joint edema Neuro: non-focal, well-oriented, appropriate affect Breasts: Deferred   Lab Results  Component Value Date   WBC 7.6 04/15/2020   HGB 13.4 04/15/2020   HCT 41.7 04/15/2020   MCV 100.5 (H) 04/15/2020   PLT 169 04/15/2020   No results found for: FERRITIN, IRON, TIBC, UIBC, IRONPCTSAT Lab Results  Component Value Date   RBC 4.15 (L) 04/15/2020   Lab Results  Component Value Date   KPAFRELGTCHN 238.1 (H) 03/25/2020   LAMBDASER 8.3 03/25/2020   KAPLAMBRATIO 28.69 (H)  03/25/2020   Lab Results  Component Value Date   IGGSERUM 651 03/25/2020   IGA 107 03/25/2020   IGMSERUM 19 03/25/2020   Lab Results  Component Value Date   TOTALPROTELP 5.7 (L) 03/25/2020   ALBUMINELP 3.4 03/25/2020   A1GS 0.3 03/25/2020   A2GS 0.6 03/25/2020   BETS 0.7 03/25/2020   GAMS 0.6 03/25/2020   MSPIKE Not Observed 03/25/2020   SPEI Comment 01/01/2020     Chemistry      Component Value Date/Time   NA 141 04/15/2020 0902   K 4.4 04/15/2020 0902   CL 105 04/15/2020 0902   CO2 30 04/15/2020 0902   BUN 17 04/15/2020 0902   CREATININE 1.65 (H) 04/15/2020 0902      Component Value Date/Time   CALCIUM 9.4 04/15/2020 0902   ALKPHOS 39 04/15/2020 0902   AST 12 (L) 04/15/2020 0902   ALT 11 04/15/2020 0902   BILITOT 0.9 04/15/2020 0902       Impression and Plan: Shawn Bauer is a very pleasant 86 yo caucasian gentleman with IgG kappa myeloma.  We will proceed with treatment today as planned per Dr. Marin Olp. He received his Velcade weekly for 3 weeks and 1 week off.  We continue to follow along with him monthly.  He can contact our office with any questions or concerns. We can certainly see him sooner if needed.   Laverna Peace, NP 7/23/202110:10 AM

## 2020-04-16 LAB — IGG, IGA, IGM
IgA: 115 mg/dL (ref 61–437)
IgG (Immunoglobin G), Serum: 682 mg/dL (ref 603–1613)
IgM (Immunoglobulin M), Srm: 21 mg/dL (ref 15–143)

## 2020-04-18 LAB — PROTEIN ELECTROPHORESIS, SERUM, WITH REFLEX
A/G Ratio: 1.5 (ref 0.7–1.7)
Albumin ELP: 3.5 g/dL (ref 2.9–4.4)
Alpha-1-Globulin: 0.2 g/dL (ref 0.0–0.4)
Alpha-2-Globulin: 0.6 g/dL (ref 0.4–1.0)
Beta Globulin: 0.7 g/dL (ref 0.7–1.3)
Gamma Globulin: 0.7 g/dL (ref 0.4–1.8)
Globulin, Total: 2.3 g/dL (ref 2.2–3.9)
Total Protein ELP: 5.8 g/dL — ABNORMAL LOW (ref 6.0–8.5)

## 2020-04-18 LAB — KAPPA/LAMBDA LIGHT CHAINS
Kappa free light chain: 236.5 mg/L — ABNORMAL HIGH (ref 3.3–19.4)
Kappa, lambda light chain ratio: 24.89 — ABNORMAL HIGH (ref 0.26–1.65)
Lambda free light chains: 9.5 mg/L (ref 5.7–26.3)

## 2020-04-21 ENCOUNTER — Ambulatory Visit: Payer: Medicare Other

## 2020-04-21 ENCOUNTER — Ambulatory Visit: Payer: Medicare Other | Admitting: Hematology & Oncology

## 2020-04-21 ENCOUNTER — Other Ambulatory Visit: Payer: Medicare Other

## 2020-04-21 ENCOUNTER — Other Ambulatory Visit: Payer: Self-pay | Admitting: Family

## 2020-04-21 DIAGNOSIS — C9000 Multiple myeloma not having achieved remission: Secondary | ICD-10-CM

## 2020-04-22 ENCOUNTER — Inpatient Hospital Stay: Payer: Medicare Other

## 2020-04-22 ENCOUNTER — Other Ambulatory Visit: Payer: Self-pay

## 2020-04-22 VITALS — BP 138/64 | HR 65 | Temp 98.0°F | Resp 19 | Ht 70.0 in | Wt 141.0 lb

## 2020-04-22 DIAGNOSIS — C9000 Multiple myeloma not having achieved remission: Secondary | ICD-10-CM

## 2020-04-22 DIAGNOSIS — Z5111 Encounter for antineoplastic chemotherapy: Secondary | ICD-10-CM | POA: Diagnosis not present

## 2020-04-22 LAB — CMP (CANCER CENTER ONLY)
ALT: 10 U/L (ref 0–44)
AST: 12 U/L — ABNORMAL LOW (ref 15–41)
Albumin: 4.1 g/dL (ref 3.5–5.0)
Alkaline Phosphatase: 44 U/L (ref 38–126)
Anion gap: 4 — ABNORMAL LOW (ref 5–15)
BUN: 16 mg/dL (ref 8–23)
CO2: 32 mmol/L (ref 22–32)
Calcium: 9.3 mg/dL (ref 8.9–10.3)
Chloride: 105 mmol/L (ref 98–111)
Creatinine: 1.63 mg/dL — ABNORMAL HIGH (ref 0.61–1.24)
GFR, Est AFR Am: 44 mL/min — ABNORMAL LOW (ref >60)
GFR, Estimated: 38 mL/min — ABNORMAL LOW (ref >60)
Glucose, Bld: 101 mg/dL — ABNORMAL HIGH (ref 70–99)
Potassium: 4.3 mmol/L (ref 3.5–5.1)
Sodium: 141 mmol/L (ref 135–145)
Total Bilirubin: 0.8 mg/dL (ref 0.3–1.2)
Total Protein: 6.1 g/dL — ABNORMAL LOW (ref 6.5–8.1)

## 2020-04-22 LAB — CBC WITH DIFFERENTIAL (CANCER CENTER ONLY)
Abs Immature Granulocytes: 0.05 10*3/uL (ref 0.00–0.07)
Basophils Absolute: 0.1 10*3/uL (ref 0.0–0.1)
Basophils Relative: 1 %
Eosinophils Absolute: 0.3 10*3/uL (ref 0.0–0.5)
Eosinophils Relative: 3 %
HCT: 42.8 % (ref 39.0–52.0)
Hemoglobin: 13.9 g/dL (ref 13.0–17.0)
Immature Granulocytes: 1 %
Lymphocytes Relative: 25 %
Lymphs Abs: 2.5 10*3/uL (ref 0.7–4.0)
MCH: 32.6 pg (ref 26.0–34.0)
MCHC: 32.5 g/dL (ref 30.0–36.0)
MCV: 100.2 fL — ABNORMAL HIGH (ref 80.0–100.0)
Monocytes Absolute: 1 10*3/uL (ref 0.1–1.0)
Monocytes Relative: 11 %
Neutro Abs: 5.9 10*3/uL (ref 1.7–7.7)
Neutrophils Relative %: 59 %
Platelet Count: 165 10*3/uL (ref 150–400)
RBC: 4.27 MIL/uL (ref 4.22–5.81)
RDW: 12.8 % (ref 11.5–15.5)
WBC Count: 9.8 10*3/uL (ref 4.0–10.5)
nRBC: 0 % (ref 0.0–0.2)

## 2020-04-22 MED ORDER — DEXAMETHASONE 4 MG PO TABS
20.0000 mg | ORAL_TABLET | ORAL | Status: DC
  Administered 2020-04-22: 20 mg via ORAL

## 2020-04-22 MED ORDER — DEXAMETHASONE 4 MG PO TABS
ORAL_TABLET | ORAL | Status: AC
  Filled 2020-04-22: qty 1

## 2020-04-22 MED ORDER — PROCHLORPERAZINE MALEATE 10 MG PO TABS
ORAL_TABLET | ORAL | Status: AC
  Filled 2020-04-22: qty 1

## 2020-04-22 MED ORDER — PROCHLORPERAZINE MALEATE 10 MG PO TABS
10.0000 mg | ORAL_TABLET | Freq: Once | ORAL | Status: AC
  Administered 2020-04-22: 10 mg via ORAL

## 2020-04-22 MED ORDER — DEXAMETHASONE 4 MG PO TABS
ORAL_TABLET | ORAL | Status: AC
  Filled 2020-04-22: qty 5

## 2020-04-22 MED ORDER — BORTEZOMIB CHEMO SQ INJECTION 3.5 MG (2.5MG/ML)
1.3000 mg/m2 | Freq: Once | INTRAMUSCULAR | Status: AC
  Administered 2020-04-22: 2.25 mg via SUBCUTANEOUS
  Filled 2020-04-22: qty 0.9

## 2020-04-22 NOTE — Patient Instructions (Signed)
Bortezomib injection What is this medicine? BORTEZOMIB (bor TEZ oh mib) is a medicine that targets proteins in cancer cells and stops the cancer cells from growing. It is used to treat multiple myeloma and mantle-cell lymphoma. This medicine may be used for other purposes; ask your health care provider or pharmacist if you have questions. COMMON BRAND NAME(S): Velcade What should I tell my health care provider before I take this medicine? They need to know if you have any of these conditions:  diabetes  heart disease  irregular heartbeat  liver disease  on hemodialysis  low blood counts, like low white blood cells, platelets, or hemoglobin  peripheral neuropathy  taking medicine for blood pressure  an unusual or allergic reaction to bortezomib, mannitol, boron, other medicines, foods, dyes, or preservatives  pregnant or trying to get pregnant  breast-feeding How should I use this medicine? This medicine is for injection into a vein or for injection under the skin. It is given by a health care professional in a hospital or clinic setting. Talk to your pediatrician regarding the use of this medicine in children. Special care may be needed. Overdosage: If you think you have taken too much of this medicine contact a poison control center or emergency room at once. NOTE: This medicine is only for you. Do not share this medicine with others. What if I miss a dose? It is important not to miss your dose. Call your doctor or health care professional if you are unable to keep an appointment. What may interact with this medicine? This medicine may interact with the following medications:  ketoconazole  rifampin  ritonavir  St. John's Wort This list may not describe all possible interactions. Give your health care provider a list of all the medicines, herbs, non-prescription drugs, or dietary supplements you use. Also tell them if you smoke, drink alcohol, or use illegal drugs. Some  items may interact with your medicine. What should I watch for while using this medicine? You may get drowsy or dizzy. Do not drive, use machinery, or do anything that needs mental alertness until you know how this medicine affects you. Do not stand or sit up quickly, especially if you are an older patient. This reduces the risk of dizzy or fainting spells. In some cases, you may be given additional medicines to help with side effects. Follow all directions for their use. Call your doctor or health care professional for advice if you get a fever, chills or sore throat, or other symptoms of a cold or flu. Do not treat yourself. This drug decreases your body's ability to fight infections. Try to avoid being around people who are sick. This medicine may increase your risk to bruise or bleed. Call your doctor or health care professional if you notice any unusual bleeding. You may need blood work done while you are taking this medicine. In some patients, this medicine may cause a serious brain infection that may cause death. If you have any problems seeing, thinking, speaking, walking, or standing, tell your doctor right away. If you cannot reach your doctor, urgently seek other source of medical care. Check with your doctor or health care professional if you get an attack of severe diarrhea, nausea and vomiting, or if you sweat a lot. The loss of too much body fluid can make it dangerous for you to take this medicine. Do not become pregnant while taking this medicine or for at least 7 months after stopping it. Women should inform their doctor   if they wish to become pregnant or think they might be pregnant. Men should not father a child while taking this medicine and for at least 4 months after stopping it. There is a potential for serious side effects to an unborn child. Talk to your health care professional or pharmacist for more information. Do not breast-feed an infant while taking this medicine or for 2  months after stopping it. This medicine may interfere with the ability to have a child. You should talk with your doctor or health care professional if you are concerned about your fertility. What side effects may I notice from receiving this medicine? Side effects that you should report to your doctor or health care professional as soon as possible:  allergic reactions like skin rash, itching or hives, swelling of the face, lips, or tongue  breathing problems  changes in hearing  changes in vision  fast, irregular heartbeat  feeling faint or lightheaded, falls  pain, tingling, numbness in the hands or feet  right upper belly pain  seizures  swelling of the ankles, feet, hands  unusual bleeding or bruising  unusually weak or tired  vomiting  yellowing of the eyes or skin Side effects that usually do not require medical attention (report to your doctor or health care professional if they continue or are bothersome):  changes in emotions or moods  constipation  diarrhea  loss of appetite  headache  irritation at site where injected  nausea This list may not describe all possible side effects. Call your doctor for medical advice about side effects. You may report side effects to FDA at 1-800-FDA-1088. Where should I keep my medicine? This drug is given in a hospital or clinic and will not be stored at home. NOTE: This sheet is a summary. It may not cover all possible information. If you have questions about this medicine, talk to your doctor, pharmacist, or health care provider.  2020 Elsevier/Gold Standard (2018-01-20 16:29:31)  

## 2020-04-22 NOTE — Progress Notes (Signed)
Ok to treat with creatinine of 1.63 per Dr Marin Olp. dph

## 2020-04-28 ENCOUNTER — Inpatient Hospital Stay: Payer: Medicare Other

## 2020-04-28 ENCOUNTER — Other Ambulatory Visit: Payer: Self-pay

## 2020-04-28 ENCOUNTER — Inpatient Hospital Stay: Payer: Medicare Other | Attending: Family

## 2020-04-28 ENCOUNTER — Other Ambulatory Visit: Payer: Self-pay | Admitting: *Deleted

## 2020-04-28 VITALS — BP 147/66 | HR 69 | Temp 98.0°F | Resp 18

## 2020-04-28 DIAGNOSIS — Z5111 Encounter for antineoplastic chemotherapy: Secondary | ICD-10-CM | POA: Diagnosis not present

## 2020-04-28 DIAGNOSIS — Z79899 Other long term (current) drug therapy: Secondary | ICD-10-CM | POA: Diagnosis not present

## 2020-04-28 DIAGNOSIS — C9000 Multiple myeloma not having achieved remission: Secondary | ICD-10-CM

## 2020-04-28 DIAGNOSIS — I4892 Unspecified atrial flutter: Secondary | ICD-10-CM | POA: Insufficient documentation

## 2020-04-28 DIAGNOSIS — R5383 Other fatigue: Secondary | ICD-10-CM | POA: Insufficient documentation

## 2020-04-28 LAB — CBC WITH DIFFERENTIAL (CANCER CENTER ONLY)
Abs Immature Granulocytes: 0.08 10*3/uL — ABNORMAL HIGH (ref 0.00–0.07)
Basophils Absolute: 0 10*3/uL (ref 0.0–0.1)
Basophils Relative: 0 %
Eosinophils Absolute: 0.2 10*3/uL (ref 0.0–0.5)
Eosinophils Relative: 2 %
HCT: 40.8 % (ref 39.0–52.0)
Hemoglobin: 13.5 g/dL (ref 13.0–17.0)
Immature Granulocytes: 1 %
Lymphocytes Relative: 27 %
Lymphs Abs: 2.5 10*3/uL (ref 0.7–4.0)
MCH: 32.9 pg (ref 26.0–34.0)
MCHC: 33.1 g/dL (ref 30.0–36.0)
MCV: 99.5 fL (ref 80.0–100.0)
Monocytes Absolute: 0.9 10*3/uL (ref 0.1–1.0)
Monocytes Relative: 10 %
Neutro Abs: 5.5 10*3/uL (ref 1.7–7.7)
Neutrophils Relative %: 60 %
Platelet Count: 143 10*3/uL — ABNORMAL LOW (ref 150–400)
RBC: 4.1 MIL/uL — ABNORMAL LOW (ref 4.22–5.81)
RDW: 12.6 % (ref 11.5–15.5)
WBC Count: 9.2 10*3/uL (ref 4.0–10.5)
nRBC: 0 % (ref 0.0–0.2)

## 2020-04-28 LAB — CMP (CANCER CENTER ONLY)
ALT: 10 U/L (ref 0–44)
AST: 11 U/L — ABNORMAL LOW (ref 15–41)
Albumin: 3.8 g/dL (ref 3.5–5.0)
Alkaline Phosphatase: 37 U/L — ABNORMAL LOW (ref 38–126)
Anion gap: 3 — ABNORMAL LOW (ref 5–15)
BUN: 19 mg/dL (ref 8–23)
CO2: 34 mmol/L — ABNORMAL HIGH (ref 22–32)
Calcium: 9.6 mg/dL (ref 8.9–10.3)
Chloride: 105 mmol/L (ref 98–111)
Creatinine: 1.6 mg/dL — ABNORMAL HIGH (ref 0.61–1.24)
GFR, Est AFR Am: 45 mL/min — ABNORMAL LOW (ref >60)
GFR, Estimated: 39 mL/min — ABNORMAL LOW (ref >60)
Glucose, Bld: 105 mg/dL — ABNORMAL HIGH (ref 70–99)
Potassium: 4.6 mmol/L (ref 3.5–5.1)
Sodium: 142 mmol/L (ref 135–145)
Total Bilirubin: 0.8 mg/dL (ref 0.3–1.2)
Total Protein: 5.7 g/dL — ABNORMAL LOW (ref 6.5–8.1)

## 2020-04-28 MED ORDER — BORTEZOMIB CHEMO SQ INJECTION 3.5 MG (2.5MG/ML)
1.3000 mg/m2 | Freq: Once | INTRAMUSCULAR | Status: AC
  Administered 2020-04-28: 2.25 mg via SUBCUTANEOUS
  Filled 2020-04-28: qty 0.9

## 2020-04-28 MED ORDER — DEXAMETHASONE 4 MG PO TABS
ORAL_TABLET | ORAL | Status: AC
  Filled 2020-04-28: qty 5

## 2020-04-28 MED ORDER — PROCHLORPERAZINE MALEATE 10 MG PO TABS
ORAL_TABLET | ORAL | Status: AC
  Filled 2020-04-28: qty 1

## 2020-04-28 MED ORDER — PROCHLORPERAZINE MALEATE 10 MG PO TABS
10.0000 mg | ORAL_TABLET | Freq: Once | ORAL | Status: AC
  Administered 2020-04-28: 10 mg via ORAL

## 2020-04-28 MED ORDER — DEXAMETHASONE 4 MG PO TABS
20.0000 mg | ORAL_TABLET | ORAL | Status: DC
  Administered 2020-04-28: 20 mg via ORAL

## 2020-04-29 ENCOUNTER — Encounter: Payer: Self-pay | Admitting: Internal Medicine

## 2020-04-29 ENCOUNTER — Other Ambulatory Visit: Payer: Medicare Other

## 2020-04-29 ENCOUNTER — Ambulatory Visit: Payer: Medicare Other

## 2020-04-29 ENCOUNTER — Ambulatory Visit (INDEPENDENT_AMBULATORY_CARE_PROVIDER_SITE_OTHER): Payer: Medicare Other | Admitting: Internal Medicine

## 2020-04-29 VITALS — BP 132/72 | HR 87 | Ht 70.0 in | Wt 142.8 lb

## 2020-04-29 DIAGNOSIS — I483 Typical atrial flutter: Secondary | ICD-10-CM

## 2020-04-29 NOTE — Progress Notes (Signed)
HPI [name] is referred today by Shawn Peals, PA-C from our Atrial fib clinic. He is a pleasant very active 86 yo man with CAD, multiple myeloma, s/p CABG, who was found to have atrial flutter and placed on systemic anti-coagulation. The patient tolerates his flutter but has difficulty with strenuous activity. No syncope. No edema. No Known Allergies   Current Outpatient Medications  Medication Sig Dispense Refill  . apixaban (ELIQUIS) 2.5 MG TABS tablet Take 1 tablet (2.5 mg total) by mouth 2 (two) times daily. 60 tablet 3  . budesonide-formoterol (SYMBICORT) 160-4.5 MCG/ACT inhaler Inhale 2 puffs into the lungs 2 (two) times daily.    . cyanocobalamin 1000 MCG tablet Take 1,000 mcg by mouth daily.    Marland Kitchen lovastatin (MEVACOR) 40 MG tablet Take 2 tablets once a day    . metoprolol succinate (TOPROL-XL) 50 MG 24 hr tablet Take 50 mg by mouth daily. Patient taking 1/2 tab in the morning and 1/2 tab in the evening. Total 50 mg per day. Take with or immediately following a meal.    . nitroGLYCERIN (NITROSTAT) 0.4 MG SL tablet Place 1 tablet (0.4 mg total) under the tongue every 5 (five) minutes as needed for chest pain. 25 tablet 11  . olmesartan (BENICAR) 20 MG tablet Take 20 mg by mouth daily.    . traMADol (ULTRAM) 50 MG tablet Take 50 mg by mouth 2 (two) times daily as needed.     No current facility-administered medications for this visit.     Past Medical History:  Diagnosis Date  . Asthma   . CAD (coronary artery disease)    s/p CABG  . Degenerative disk disease    cervical/spinal stenosis  . Goals of care, counseling/discussion 09/30/2019  . HTN (hypertension)   . Hyperlipidemia   . Kappa light chain myeloma ([place]) 09/30/2019    ROS:   All systems reviewed and negative except as noted in the HPI.   Past Surgical History:  Procedure Laterality Date  . APPENDECTOMY    . BACK SURGERY    . bilateral inguinal hernia repair    . CORONARY ARTERY BYPASS GRAFT  2001    after positive exercise treadmill test.  LIMA-LAD, Radial artery to LCx marginal, SVG to second diagonal, SVG to PDA  . TONSILLECTOMY AND ADENOIDECTOMY       Family History  Problem Relation Age of Onset  . Heart attack Father 61       died  . Heart attack Brother      Social History   Socioeconomic History  . Marital status: Married    Spouse name: Not on file  . Number of children: 3  . Years of education: Not on file  . Highest education level: Not on file  Occupational History  . Occupation: [employer]  Tobacco Use  . Smoking status: Former Smoker    Types: Cigarettes    Quit date: 08/31/1964    Years since quitting: 55.6  . Smokeless tobacco: Former Network engineer  . Vaping Use: Never used  Substance and Sexual Activity  . Alcohol use: No  . Drug use: No  . Sexual activity: Not on file  Other Topics Concern  . Not on file  Social History Narrative  . Not on file   Social Determinants of Health   Financial Resource Strain:   . Difficulty of Paying Living Expenses:   Food Insecurity:   . Worried About Charity fundraiser  in the Last Year:   . [place] in the Last Year:   Transportation Needs:   . Film/video editor (Medical):   Marland Kitchen Lack of Transportation (Non-Medical):   Physical Activity:   . Days of Exercise per Week:   . Minutes of Exercise per Session:   Stress:   . Feeling of Stress :   Social Connections:   . Frequency of Communication with Friends and Family:   . Frequency of Social Gatherings with Friends and Family:   . Attends Religious Services:   . Active Member of Clubs or Organizations:   . Attends Archivist Meetings:   Marland Kitchen Marital Status:   Intimate Partner Violence:   . Fear of Current or Ex-Partner:   . Emotionally Abused:   Marland Kitchen Physically Abused:   . Sexually Abused:      BP 132/72   Pulse 87   Ht _0  (1.778 m)   Wt 142 lb 12.8 oz (64.8 kg)   SpO2 100%   BMI 20.49 kg/m   Physical  Exam:  Well appearing NAD HEENT: Unremarkable Neck:  No JVD, no thyromegally Lymphatics:  No adenopathy Back:  No CVA tenderness Lungs:  Clear with no wheezes HEART:  IRegular rate rhythm, no murmurs, no rubs, no clicks Abd:  soft, positive bowel sounds, no organomegally, no rebound, no guarding Ext:  2 plus pulses, no edema, no cyanosis, no clubbing Skin:  No rashes no nodules Neuro:  CN II through XII intact, motor grossly intact  EKG - atrial flutter with a controlled VR   Assess/Plan: 1. Atrial flutter - I have discussed the treatment options in detail and the risks/benefits/goals/expectations of EP study and catheter ablation were reviewed and he will call us if he wishes to proceed. 2. CAD - he denies anginal symptoms s/p CABG 3. Coags - he has not had any bleeding problems with Eliquis. He will continue and hold his eliquis on the day of the catheter ablation.  Mikle Bosworth.D.

## 2020-04-29 NOTE — Patient Instructions (Addendum)
Medication Instructions:  Your physician recommends that you continue on your current medications as directed. Please refer to the Current Medication list given to you today.  Labwork: None ordered.  Testing/Procedures: Your physician has recommended that you have an ablation. Catheter ablation is a medical procedure used to treat some cardiac arrhythmias (irregular heartbeats). During catheter ablation, a long, thin, flexible tube is put into a blood vessel in your groin (upper thigh), or neck. This tube is called an ablation catheter. It is then guided to your heart through the blood vessel. Radio frequency waves destroy small areas of heart tissue where abnormal heartbeats may cause an arrhythmia to start. Please see the instruction sheet given to you today.  Follow-Up:  The following dates are available for procedure: August 30  September 13, 27  Any Other Special Instructions Will Be Listed Below (If Applicable).  If you need a refill on your cardiac medications before your next appointment, please call your pharmacy.    Cardiac Ablation  Cardiac ablation is a procedure to stop some heart tissue from causing problems. The heart has many electrical connections. Sometimes these connections make the heart beat very fast or irregularly. Removing some problem areas can improve the heart rhythm or make it normal. What happens before the procedure?  Follow instructions from your doctor about what you cannot eat or drink.  Ask your doctor about: ? Changing or stopping your normal medicines. This is important if you take diabetes medicines or blood thinners. ? Taking medicines such as aspirin and ibuprofen. These medicines can thin your blood. Do not take these medicines before your procedure if your doctor tells you not to.  Plan to have someone take you home.  If you will be going home right after the procedure, plan to have someone with you for 24 hours. What happens during the  procedure?  To lower your risk of infection: ? Your health care team will wash or sanitize their hands. ? Your skin will be washed with soap. ? Hair may be removed from your neck or groin.  An IV tube will be put into one of your veins.  You will be given a medicine to help you relax (sedative).  Skin on your neck or groin will be numbed.  A cut (incision) will be made in your neck or groin.  A needle will be put through your cut and into a vein in your neck or groin.  A tube (catheter) will be put into the needle. The tube will be moved to your heart. X-rays (fluoroscopy) will be used to help guide the tube.  Small devices (electrodes) on the tip of the tube will send out electrical currents.  Dye may be put through the tube. This helps your surgeon see your heart.  Electrical energy will be used to scar (ablate) some heart tissue. Your surgeon may use: ? Heat (radiofrequency energy). ? Laser energy. ? Extreme cold (cryoablation).  The tube will be taken out.  Pressure will be held on your cut. This helps stop bleeding.  A bandage (dressing) will be put on your cut. The procedure may vary. What happens after the procedure?  You will be monitored until your medicines have worn off.  Your cut will be watched for bleeding. You will need to lie still for a few hours.  Do not drive for 24 hours or as long as your doctor tells you. Summary  Cardiac ablation is a procedure to stop some heart tissue from causing  problems.  Electrical energy will be used to scar (ablate) some heart tissue. This information is not intended to replace advice given to you by your health care provider. Make sure you discuss any questions you have with your health care provider. Document Revised: 08/23/2017 Document Reviewed: 07/30/2016 Elsevier Patient Education  2020 [employer].

## 2020-04-29 NOTE — H&P (View-Only) (Signed)
    HPI Shawn Bauer is referred today by Ricky Fenton, PA-C from our Atrial fib clinic. He is a pleasant very active 85 yo man with CAD, multiple myeloma, s/p CABG, who was found to have atrial flutter and placed on systemic anti-coagulation. The patient tolerates his flutter but has difficulty with strenuous activity. No syncope. No edema. No Known Allergies   Current Outpatient Medications  Medication Sig Dispense Refill  . apixaban (ELIQUIS) 2.5 MG TABS tablet Take 1 tablet (2.5 mg total) by mouth 2 (two) times daily. 60 tablet 3  . budesonide-formoterol (SYMBICORT) 160-4.5 MCG/ACT inhaler Inhale 2 puffs into the lungs 2 (two) times daily.    . cyanocobalamin 1000 MCG tablet Take 1,000 mcg by mouth daily.    . lovastatin (MEVACOR) 40 MG tablet Take 2 tablets once a day    . metoprolol succinate (TOPROL-XL) 50 MG 24 hr tablet Take 50 mg by mouth daily. Patient taking 1/2 tab in the morning and 1/2 tab in the evening. Total 50 mg per day. Take with or immediately following a meal.    . nitroGLYCERIN (NITROSTAT) 0.4 MG SL tablet Place 1 tablet (0.4 mg total) under the tongue every 5 (five) minutes as needed for chest pain. 25 tablet 11  . olmesartan (BENICAR) 20 MG tablet Take 20 mg by mouth daily.    . traMADol (ULTRAM) 50 MG tablet Take 50 mg by mouth 2 (two) times daily as needed.     No current facility-administered medications for this visit.     Past Medical History:  Diagnosis Date  . Asthma   . CAD (coronary artery disease)    s/p CABG  . Degenerative disk disease    cervical/spinal stenosis  . Goals of care, counseling/discussion 09/30/2019  . HTN (hypertension)   . Hyperlipidemia   . Kappa light chain myeloma (HCC) 09/30/2019    ROS:   All systems reviewed and negative except as noted in the HPI.   Past Surgical History:  Procedure Laterality Date  . APPENDECTOMY    . BACK SURGERY    . bilateral inguinal hernia repair    . CORONARY ARTERY BYPASS GRAFT  2001    after positive exercise treadmill test.  LIMA-LAD, Radial artery to LCx marginal, SVG to second diagonal, SVG to PDA  . TONSILLECTOMY AND ADENOIDECTOMY       Family History  Problem Relation Age of Onset  . Heart attack Father 79       died  . Heart attack Brother      Social History   Socioeconomic History  . Marital status: Married    Spouse name: Not on file  . Number of children: 3  . Years of education: Not on file  . Highest education level: Not on file  Occupational History  . Occupation: [employer]  Tobacco Use  . Smoking status: Former Smoker    Types: Cigarettes    Quit date: 08/31/1964    Years since quitting: 55.6  . Smokeless tobacco: Former User  Vaping Use  . Vaping Use: Never used  Substance and Sexual Activity  . Alcohol use: No  . Drug use: No  . Sexual activity: Not on file  Other Topics Concern  . Not on file  Social History Narrative  . Not on file   Social Determinants of Health   Financial Resource Strain:   . Difficulty of Paying Living Expenses:   Food Insecurity:   . Worried About Running Out of Food   in the Last Year:   . Ran Out of Food in the Last Year:   Transportation Needs:   . Lack of Transportation (Medical):   . Lack of Transportation (Non-Medical):   Physical Activity:   . Days of Exercise per Week:   . Minutes of Exercise per Session:   Stress:   . Feeling of Stress :   Social Connections:   . Frequency of Communication with Friends and Family:   . Frequency of Social Gatherings with Friends and Family:   . Attends Religious Services:   . Active Member of Clubs or Organizations:   . Attends Club or Organization Meetings:   . Marital Status:   Intimate Partner Violence:   . Fear of Current or Ex-Partner:   . Emotionally Abused:   . Physically Abused:   . Sexually Abused:      BP 132/72   Pulse 87   Ht 5' 10" (1.778 m)   Wt 142 lb 12.8 oz (64.8 kg)   SpO2 100%   BMI 20.49 kg/m   Physical  Exam:  Well appearing NAD HEENT: Unremarkable Neck:  No JVD, no thyromegally Lymphatics:  No adenopathy Back:  No CVA tenderness Lungs:  Clear with no wheezes HEART:  IRegular rate rhythm, no murmurs, no rubs, no clicks Abd:  soft, positive bowel sounds, no organomegally, no rebound, no guarding Ext:  2 plus pulses, no edema, no cyanosis, no clubbing Skin:  No rashes no nodules Neuro:  CN II through XII intact, motor grossly intact  EKG - atrial flutter with a controlled VR   Assess/Plan: 1. Atrial flutter - I have discussed the treatment options in detail and the risks/benefits/goals/expectations of EP study and catheter ablation were reviewed and he will call us if he wishes to proceed. 2. CAD - he denies anginal symptoms s/p CABG 3. Coags - he has not had any bleeding problems with Eliquis. He will continue and hold his eliquis on the day of the catheter ablation.  Violette Morneault,M.D. 

## 2020-05-02 ENCOUNTER — Telehealth: Payer: Self-pay | Admitting: Internal Medicine

## 2020-05-02 NOTE — Telephone Encounter (Signed)
    Pt's wife calling, would like to schedule pt's procedure

## 2020-05-02 NOTE — Telephone Encounter (Signed)
Call returned to wife.  Advised would call tomorrow morning to schedule ablation.

## 2020-05-02 NOTE — Telephone Encounter (Signed)
Beverlee Nims is calling back requesting to speak with Sonia Baller due to not receiving a callback from her yet.

## 2020-05-03 NOTE — Telephone Encounter (Signed)
Call returned to Pt.  Pt scheduled for aflutter ablation on May 23, 2020 at 7:30 am  Pt has scheduled lab work with oncology 8/20/*21  Will get covid test 8/27  Instruction letter mailed to Pt  Work up complete

## 2020-05-13 ENCOUNTER — Inpatient Hospital Stay: Payer: Medicare Other

## 2020-05-13 ENCOUNTER — Other Ambulatory Visit: Payer: Self-pay

## 2020-05-13 ENCOUNTER — Inpatient Hospital Stay (HOSPITAL_BASED_OUTPATIENT_CLINIC_OR_DEPARTMENT_OTHER): Payer: Medicare Other | Admitting: Family

## 2020-05-13 ENCOUNTER — Telehealth: Payer: Self-pay | Admitting: Family

## 2020-05-13 ENCOUNTER — Encounter: Payer: Self-pay | Admitting: Family

## 2020-05-13 VITALS — BP 134/72 | HR 77 | Temp 98.5°F | Resp 18 | Ht 70.0 in | Wt 141.0 lb

## 2020-05-13 DIAGNOSIS — I25119 Atherosclerotic heart disease of native coronary artery with unspecified angina pectoris: Secondary | ICD-10-CM | POA: Diagnosis not present

## 2020-05-13 DIAGNOSIS — C9000 Multiple myeloma not having achieved remission: Secondary | ICD-10-CM

## 2020-05-13 DIAGNOSIS — Z5111 Encounter for antineoplastic chemotherapy: Secondary | ICD-10-CM | POA: Diagnosis not present

## 2020-05-13 LAB — CMP (CANCER CENTER ONLY)
ALT: 9 U/L (ref 0–44)
AST: 10 U/L — ABNORMAL LOW (ref 15–41)
Albumin: 3.9 g/dL (ref 3.5–5.0)
Alkaline Phosphatase: 37 U/L — ABNORMAL LOW (ref 38–126)
Anion gap: 5 (ref 5–15)
BUN: 20 mg/dL (ref 8–23)
CO2: 32 mmol/L (ref 22–32)
Calcium: 9.6 mg/dL (ref 8.9–10.3)
Chloride: 105 mmol/L (ref 98–111)
Creatinine: 1.53 mg/dL — ABNORMAL HIGH (ref 0.61–1.24)
GFR, Est AFR Am: 48 mL/min — ABNORMAL LOW (ref >60)
GFR, Estimated: 41 mL/min — ABNORMAL LOW (ref >60)
Glucose, Bld: 117 mg/dL — ABNORMAL HIGH (ref 70–99)
Potassium: 4.1 mmol/L (ref 3.5–5.1)
Sodium: 142 mmol/L (ref 135–145)
Total Bilirubin: 0.9 mg/dL (ref 0.3–1.2)
Total Protein: 6.1 g/dL — ABNORMAL LOW (ref 6.5–8.1)

## 2020-05-13 LAB — CBC WITH DIFFERENTIAL (CANCER CENTER ONLY)
Abs Immature Granulocytes: 0.04 10*3/uL (ref 0.00–0.07)
Basophils Absolute: 0.1 10*3/uL (ref 0.0–0.1)
Basophils Relative: 1 %
Eosinophils Absolute: 0.3 10*3/uL (ref 0.0–0.5)
Eosinophils Relative: 3 %
HCT: 42.2 % (ref 39.0–52.0)
Hemoglobin: 13.6 g/dL (ref 13.0–17.0)
Immature Granulocytes: 0 %
Lymphocytes Relative: 22 %
Lymphs Abs: 2.1 10*3/uL (ref 0.7–4.0)
MCH: 32.5 pg (ref 26.0–34.0)
MCHC: 32.2 g/dL (ref 30.0–36.0)
MCV: 101 fL — ABNORMAL HIGH (ref 80.0–100.0)
Monocytes Absolute: 0.9 10*3/uL (ref 0.1–1.0)
Monocytes Relative: 9 %
Neutro Abs: 6.3 10*3/uL (ref 1.7–7.7)
Neutrophils Relative %: 65 %
Platelet Count: 217 10*3/uL (ref 150–400)
RBC: 4.18 MIL/uL — ABNORMAL LOW (ref 4.22–5.81)
RDW: 12.5 % (ref 11.5–15.5)
WBC Count: 9.7 10*3/uL (ref 4.0–10.5)
nRBC: 0 % (ref 0.0–0.2)

## 2020-05-13 MED ORDER — PROCHLORPERAZINE MALEATE 10 MG PO TABS
10.0000 mg | ORAL_TABLET | Freq: Once | ORAL | Status: AC
  Administered 2020-05-13: 10 mg via ORAL

## 2020-05-13 MED ORDER — DEXAMETHASONE 4 MG PO TABS
20.0000 mg | ORAL_TABLET | ORAL | Status: DC
  Administered 2020-05-13: 20 mg via ORAL

## 2020-05-13 MED ORDER — PROCHLORPERAZINE MALEATE 10 MG PO TABS
ORAL_TABLET | ORAL | Status: AC
  Filled 2020-05-13: qty 1

## 2020-05-13 MED ORDER — BORTEZOMIB CHEMO SQ INJECTION 3.5 MG (2.5MG/ML)
1.3000 mg/m2 | Freq: Once | INTRAMUSCULAR | Status: AC
  Administered 2020-05-13: 2.25 mg via SUBCUTANEOUS
  Filled 2020-05-13: qty 0.9

## 2020-05-13 MED ORDER — DEXAMETHASONE 4 MG PO TABS
ORAL_TABLET | ORAL | Status: AC
  Filled 2020-05-13: qty 5

## 2020-05-13 NOTE — Patient Instructions (Signed)
Bortezomib injection What is this medicine? BORTEZOMIB (bor TEZ oh mib) is a medicine that targets proteins in cancer cells and stops the cancer cells from growing. It is used to treat multiple myeloma and mantle-cell lymphoma. This medicine may be used for other purposes; ask your health care provider or pharmacist if you have questions. COMMON BRAND NAME(S): Velcade What should I tell my health care provider before I take this medicine? They need to know if you have any of these conditions:  diabetes  heart disease  irregular heartbeat  liver disease  on hemodialysis  low blood counts, like low white blood cells, platelets, or hemoglobin  peripheral neuropathy  taking medicine for blood pressure  an unusual or allergic reaction to bortezomib, mannitol, boron, other medicines, foods, dyes, or preservatives  pregnant or trying to get pregnant  breast-feeding How should I use this medicine? This medicine is for injection into a vein or for injection under the skin. It is given by a health care professional in a hospital or clinic setting. Talk to your pediatrician regarding the use of this medicine in children. Special care may be needed. Overdosage: If you think you have taken too much of this medicine contact a poison control center or emergency room at once. NOTE: This medicine is only for you. Do not share this medicine with others. What if I miss a dose? It is important not to miss your dose. Call your doctor or health care professional if you are unable to keep an appointment. What may interact with this medicine? This medicine may interact with the following medications:  ketoconazole  rifampin  ritonavir  St. John's Wort This list may not describe all possible interactions. Give your health care provider a list of all the medicines, herbs, non-prescription drugs, or dietary supplements you use. Also tell them if you smoke, drink alcohol, or use illegal drugs. Some  items may interact with your medicine. What should I watch for while using this medicine? You may get drowsy or dizzy. Do not drive, use machinery, or do anything that needs mental alertness until you know how this medicine affects you. Do not stand or sit up quickly, especially if you are an older patient. This reduces the risk of dizzy or fainting spells. In some cases, you may be given additional medicines to help with side effects. Follow all directions for their use. Call your doctor or health care professional for advice if you get a fever, chills or sore throat, or other symptoms of a cold or flu. Do not treat yourself. This drug decreases your body's ability to fight infections. Try to avoid being around people who are sick. This medicine may increase your risk to bruise or bleed. Call your doctor or health care professional if you notice any unusual bleeding. You may need blood work done while you are taking this medicine. In some patients, this medicine may cause a serious brain infection that may cause death. If you have any problems seeing, thinking, speaking, walking, or standing, tell your doctor right away. If you cannot reach your doctor, urgently seek other source of medical care. Check with your doctor or health care professional if you get an attack of severe diarrhea, nausea and vomiting, or if you sweat a lot. The loss of too much body fluid can make it dangerous for you to take this medicine. Do not become pregnant while taking this medicine or for at least 7 months after stopping it. Women should inform their doctor   if they wish to become pregnant or think they might be pregnant. Men should not father a child while taking this medicine and for at least 4 months after stopping it. There is a potential for serious side effects to an unborn child. Talk to your health care professional or pharmacist for more information. Do not breast-feed an infant while taking this medicine or for 2  months after stopping it. This medicine may interfere with the ability to have a child. You should talk with your doctor or health care professional if you are concerned about your fertility. What side effects may I notice from receiving this medicine? Side effects that you should report to your doctor or health care professional as soon as possible:  allergic reactions like skin rash, itching or hives, swelling of the face, lips, or tongue  breathing problems  changes in hearing  changes in vision  fast, irregular heartbeat  feeling faint or lightheaded, falls  pain, tingling, numbness in the hands or feet  right upper belly pain  seizures  swelling of the ankles, feet, hands  unusual bleeding or bruising  unusually weak or tired  vomiting  yellowing of the eyes or skin Side effects that usually do not require medical attention (report to your doctor or health care professional if they continue or are bothersome):  changes in emotions or moods  constipation  diarrhea  loss of appetite  headache  irritation at site where injected  nausea This list may not describe all possible side effects. Call your doctor for medical advice about side effects. You may report side effects to FDA at 1-800-FDA-1088. Where should I keep my medicine? This drug is given in a hospital or clinic and will not be stored at home. NOTE: This sheet is a summary. It may not cover all possible information. If you have questions about this medicine, talk to your doctor, pharmacist, or health care provider.  2020 Elsevier/Gold Standard (2018-01-20 16:29:31)  

## 2020-05-13 NOTE — Telephone Encounter (Signed)
Appointments scheduled calendar printed per 8/20 los 

## 2020-05-13 NOTE — Progress Notes (Signed)
Hematology and Oncology Follow Up Visit  Shawn Bauer [facility] [id number] 25-Apr-1934 86 y.o. 05/13/2020   Principle Diagnosis:  IgG Kappa myeloma -- high risk due to t(4:21)  Current Therapy: Velcade/Decadron --started01/15/2021, s/p cycle 7   Interim History:  Shawn Bauer is here today for follow-up and treatment. He is doing well but does have some mild fatigue at times.  He is scheduled to have an ablation for the atrial flutter on Monday August 30th. In July, No M-spike was detected, IgG level was 682 mg/dL and kappa light chains 23.65 mg/dL.  No fever, chills, n/v, cough, rash, dizziness, SOB, chest pain, palpitations, abdominal pain or changes in bowel or bladder habits.  No swelling, tenderness, numbness or tingling in his extremities.  No falls or syncope.  He states that he has a good appetite and is staying well hydrated. His weight is stable.   ECOG Performance Status: 1 - Symptomatic but completely ambulatory  Medications:  Allergies as of 05/13/2020   No Known Allergies     Medication List       Accurate as of May 13, 2020  9:40 AM. If you have any questions, ask your nurse or doctor.        apixaban 2.5 MG Tabs tablet Commonly known as: ELIQUIS Take 1 tablet (2.5 mg total) by mouth 2 (two) times daily.   budesonide-formoterol 160-4.5 MCG/ACT inhaler Commonly known as: SYMBICORT Inhale 2 puffs into the lungs 2 (two) times daily.   cyanocobalamin 1000 MCG tablet Take 1,000 mcg by mouth daily.   lovastatin 40 MG tablet Commonly known as: MEVACOR Take 2 tablets once a day   metoprolol succinate 50 MG 24 hr tablet Commonly known as: TOPROL-XL Take 50 mg by mouth daily. Patient taking 1/2 tab in the morning and 1/2 tab in the evening. Total 50 mg per day. Take with or immediately following a meal.   nitroGLYCERIN 0.4 MG SL tablet Commonly known as: NITROSTAT Place 1 tablet (0.4 mg total) under the tongue every 5 (five) minutes as needed for chest  pain.   olmesartan 20 MG tablet Commonly known as: BENICAR Take 20 mg by mouth daily.   traMADol 50 MG tablet Commonly known as: ULTRAM Take 50 mg by mouth 2 (two) times daily as needed.       Allergies: No Known Allergies  Past Medical History, Surgical history, Social history, and Family History were reviewed and updated.  Review of Systems: All other 10 point review of systems is negative.   Physical Exam:  vitals were not taken for this visit.   Wt Readings from Last 3 Encounters:  04/29/20 142 lb 12.8 oz (64.8 kg)  04/22/20 141 lb 0.6 oz (64 kg)  04/15/20 141 lb 12.8 oz (64.3 kg)    Ocular: Sclerae unicteric, pupils equal, round and reactive to light Ear-nose-throat: Oropharynx clear, dentition fair Lymphatic: No cervical or supraclavicular adenopathy Lungs no rales or rhonchi, good excursion bilaterally Heart atrial flutter, no murmur appreciated Abd soft, nontender, positive bowel sounds, no liver or spleen tip palpated on exam, no fluid wave  MSK no focal spinal tenderness, no joint edema Neuro: non-focal, well-oriented, appropriate affect Breasts: Deferred  Lab Results  Component Value Date   WBC 9.7 05/13/2020   HGB 13.6 05/13/2020   HCT 42.2 05/13/2020   MCV 101.0 (H) 05/13/2020   PLT 217 05/13/2020   No results found for: FERRITIN, IRON, TIBC, UIBC, IRONPCTSAT Lab Results  Component Value Date   RBC 4.18 (L) 05/13/2020  Lab Results  Component Value Date   KPAFRELGTCHN 236.5 (H) 04/15/2020   LAMBDASER 9.5 04/15/2020   KAPLAMBRATIO 24.89 (H) 04/15/2020   Lab Results  Component Value Date   IGGSERUM 682 04/15/2020   IGA 115 04/15/2020   IGMSERUM 21 04/15/2020   Lab Results  Component Value Date   TOTALPROTELP 5.8 (L) 04/15/2020   ALBUMINELP 3.5 04/15/2020   A1GS 0.2 04/15/2020   A2GS 0.6 04/15/2020   BETS 0.7 04/15/2020   GAMS 0.7 04/15/2020   MSPIKE Not Observed 04/15/2020   SPEI Comment 01/01/2020     Chemistry      Component  Value Date/Time   NA 142 05/13/2020 0902   K 4.1 05/13/2020 0902   CL 105 05/13/2020 0902   CO2 32 05/13/2020 0902   BUN 20 05/13/2020 0902   CREATININE 1.53 (H) 05/13/2020 0902      Component Value Date/Time   CALCIUM 9.6 05/13/2020 0902   ALKPHOS 37 (L) 05/13/2020 0902   AST 10 (L) 05/13/2020 0902   ALT 9 05/13/2020 0902   BILITOT 0.9 05/13/2020 0902       Impression and Plan: Shawn Bauer is a very pleasant 86 yo caucasian gentleman with IgG kappa myeloma.  We will proceed with treatment today as planned. He gets his Velcade injections weekly for 3 weeks and off 1 week.  We will plan to see him again in another month.  He can contact our office with any questions or concerns.   Laverna Peace, NP 8/20/20219:40 AM

## 2020-05-14 LAB — IGG, IGA, IGM
IgA: 119 mg/dL (ref 61–437)
IgG (Immunoglobin G), Serum: 620 mg/dL (ref 603–1613)
IgM (Immunoglobulin M), Srm: 24 mg/dL (ref 15–143)

## 2020-05-16 LAB — PROTEIN ELECTROPHORESIS, SERUM
A/G Ratio: 1.5 (ref 0.7–1.7)
Albumin ELP: 3.5 g/dL (ref 2.9–4.4)
Alpha-1-Globulin: 0.2 g/dL (ref 0.0–0.4)
Alpha-2-Globulin: 0.7 g/dL (ref 0.4–1.0)
Beta Globulin: 0.8 g/dL (ref 0.7–1.3)
Gamma Globulin: 0.6 g/dL (ref 0.4–1.8)
Globulin, Total: 2.3 g/dL (ref 2.2–3.9)
Total Protein ELP: 5.8 g/dL — ABNORMAL LOW (ref 6.0–8.5)

## 2020-05-16 LAB — KAPPA/LAMBDA LIGHT CHAINS
Kappa free light chain: 233.2 mg/L — ABNORMAL HIGH (ref 3.3–19.4)
Kappa, lambda light chain ratio: 23.56 — ABNORMAL HIGH (ref 0.26–1.65)
Lambda free light chains: 9.9 mg/L (ref 5.7–26.3)

## 2020-05-18 ENCOUNTER — Other Ambulatory Visit: Payer: Self-pay | Admitting: Family

## 2020-05-18 DIAGNOSIS — C9000 Multiple myeloma not having achieved remission: Secondary | ICD-10-CM

## 2020-05-19 ENCOUNTER — Inpatient Hospital Stay: Payer: Medicare Other

## 2020-05-19 ENCOUNTER — Other Ambulatory Visit: Payer: Self-pay

## 2020-05-19 VITALS — BP 132/62 | HR 97 | Temp 98.6°F | Resp 17

## 2020-05-19 DIAGNOSIS — Z5111 Encounter for antineoplastic chemotherapy: Secondary | ICD-10-CM | POA: Diagnosis not present

## 2020-05-19 DIAGNOSIS — C9000 Multiple myeloma not having achieved remission: Secondary | ICD-10-CM

## 2020-05-19 LAB — CBC WITH DIFFERENTIAL (CANCER CENTER ONLY)
Abs Immature Granulocytes: 0.04 10*3/uL (ref 0.00–0.07)
Basophils Absolute: 0.1 10*3/uL (ref 0.0–0.1)
Basophils Relative: 1 %
Eosinophils Absolute: 0.3 10*3/uL (ref 0.0–0.5)
Eosinophils Relative: 4 %
HCT: 42.1 % (ref 39.0–52.0)
Hemoglobin: 13.8 g/dL (ref 13.0–17.0)
Immature Granulocytes: 1 %
Lymphocytes Relative: 24 %
Lymphs Abs: 1.9 10*3/uL (ref 0.7–4.0)
MCH: 32.6 pg (ref 26.0–34.0)
MCHC: 32.8 g/dL (ref 30.0–36.0)
MCV: 99.5 fL (ref 80.0–100.0)
Monocytes Absolute: 0.8 10*3/uL (ref 0.1–1.0)
Monocytes Relative: 10 %
Neutro Abs: 4.8 10*3/uL (ref 1.7–7.7)
Neutrophils Relative %: 60 %
Platelet Count: 191 10*3/uL (ref 150–400)
RBC: 4.23 MIL/uL (ref 4.22–5.81)
RDW: 12.5 % (ref 11.5–15.5)
WBC Count: 7.9 10*3/uL (ref 4.0–10.5)
nRBC: 0 % (ref 0.0–0.2)

## 2020-05-19 LAB — CMP (CANCER CENTER ONLY)
ALT: 11 U/L (ref 0–44)
AST: 13 U/L — ABNORMAL LOW (ref 15–41)
Albumin: 3.6 g/dL (ref 3.5–5.0)
Alkaline Phosphatase: 36 U/L — ABNORMAL LOW (ref 38–126)
Anion gap: 7 (ref 5–15)
BUN: 19 mg/dL (ref 8–23)
CO2: 28 mmol/L (ref 22–32)
Calcium: 8.8 mg/dL — ABNORMAL LOW (ref 8.9–10.3)
Chloride: 102 mmol/L (ref 98–111)
Creatinine: 1.65 mg/dL — ABNORMAL HIGH (ref 0.61–1.24)
GFR, Est AFR Am: 44 mL/min — ABNORMAL LOW (ref >60)
GFR, Estimated: 38 mL/min — ABNORMAL LOW (ref >60)
Glucose, Bld: 119 mg/dL — ABNORMAL HIGH (ref 70–99)
Potassium: 4.5 mmol/L (ref 3.5–5.1)
Sodium: 137 mmol/L (ref 135–145)
Total Bilirubin: 0.9 mg/dL (ref 0.3–1.2)
Total Protein: 6.3 g/dL — ABNORMAL LOW (ref 6.5–8.1)

## 2020-05-19 MED ORDER — PROCHLORPERAZINE MALEATE 10 MG PO TABS
10.0000 mg | ORAL_TABLET | Freq: Once | ORAL | Status: AC
  Administered 2020-05-19: 10 mg via ORAL

## 2020-05-19 MED ORDER — DEXAMETHASONE 4 MG PO TABS
20.0000 mg | ORAL_TABLET | ORAL | Status: DC
  Administered 2020-05-19: 20 mg via ORAL

## 2020-05-19 MED ORDER — DEXAMETHASONE 4 MG PO TABS
ORAL_TABLET | ORAL | Status: AC
  Filled 2020-05-19: qty 5

## 2020-05-19 MED ORDER — PROCHLORPERAZINE MALEATE 10 MG PO TABS
ORAL_TABLET | ORAL | Status: AC
  Filled 2020-05-19: qty 1

## 2020-05-19 MED ORDER — BORTEZOMIB CHEMO SQ INJECTION 3.5 MG (2.5MG/ML)
1.3000 mg/m2 | Freq: Once | INTRAMUSCULAR | Status: AC
  Administered 2020-05-19: 2.25 mg via SUBCUTANEOUS
  Filled 2020-05-19: qty 0.9

## 2020-05-19 NOTE — Patient Instructions (Signed)
Bortezomib injection What is this medicine? BORTEZOMIB (bor TEZ oh mib) is a medicine that targets proteins in cancer cells and stops the cancer cells from growing. It is used to treat multiple myeloma and mantle-cell lymphoma. This medicine may be used for other purposes; ask your health care provider or pharmacist if you have questions. COMMON BRAND NAME(S): Velcade What should I tell my health care provider before I take this medicine? They need to know if you have any of these conditions:  diabetes  heart disease  irregular heartbeat  liver disease  on hemodialysis  low blood counts, like low white blood cells, platelets, or hemoglobin  peripheral neuropathy  taking medicine for blood pressure  an unusual or allergic reaction to bortezomib, mannitol, boron, other medicines, foods, dyes, or preservatives  pregnant or trying to get pregnant  breast-feeding How should I use this medicine? This medicine is for injection into a vein or for injection under the skin. It is given by a health care professional in a hospital or clinic setting. Talk to your pediatrician regarding the use of this medicine in children. Special care may be needed. Overdosage: If you think you have taken too much of this medicine contact a poison control center or emergency room at once. NOTE: This medicine is only for you. Do not share this medicine with others. What if I miss a dose? It is important not to miss your dose. Call your doctor or health care professional if you are unable to keep an appointment. What may interact with this medicine? This medicine may interact with the following medications:  ketoconazole  rifampin  ritonavir  St. John's Wort This list may not describe all possible interactions. Give your health care provider a list of all the medicines, herbs, non-prescription drugs, or dietary supplements you use. Also tell them if you smoke, drink alcohol, or use illegal drugs. Some  items may interact with your medicine. What should I watch for while using this medicine? You may get drowsy or dizzy. Do not drive, use machinery, or do anything that needs mental alertness until you know how this medicine affects you. Do not stand or sit up quickly, especially if you are an older patient. This reduces the risk of dizzy or fainting spells. In some cases, you may be given additional medicines to help with side effects. Follow all directions for their use. Call your doctor or health care professional for advice if you get a fever, chills or sore throat, or other symptoms of a cold or flu. Do not treat yourself. This drug decreases your body's ability to fight infections. Try to avoid being around people who are sick. This medicine may increase your risk to bruise or bleed. Call your doctor or health care professional if you notice any unusual bleeding. You may need blood work done while you are taking this medicine. In some patients, this medicine may cause a serious brain infection that may cause death. If you have any problems seeing, thinking, speaking, walking, or standing, tell your doctor right away. If you cannot reach your doctor, urgently seek other source of medical care. Check with your doctor or health care professional if you get an attack of severe diarrhea, nausea and vomiting, or if you sweat a lot. The loss of too much body fluid can make it dangerous for you to take this medicine. Do not become pregnant while taking this medicine or for at least 7 months after stopping it. Women should inform their doctor   if they wish to become pregnant or think they might be pregnant. Men should not father a child while taking this medicine and for at least 4 months after stopping it. There is a potential for serious side effects to an unborn child. Talk to your health care professional or pharmacist for more information. Do not breast-feed an infant while taking this medicine or for 2  months after stopping it. This medicine may interfere with the ability to have a child. You should talk with your doctor or health care professional if you are concerned about your fertility. What side effects may I notice from receiving this medicine? Side effects that you should report to your doctor or health care professional as soon as possible:  allergic reactions like skin rash, itching or hives, swelling of the face, lips, or tongue  breathing problems  changes in hearing  changes in vision  fast, irregular heartbeat  feeling faint or lightheaded, falls  pain, tingling, numbness in the hands or feet  right upper belly pain  seizures  swelling of the ankles, feet, hands  unusual bleeding or bruising  unusually weak or tired  vomiting  yellowing of the eyes or skin Side effects that usually do not require medical attention (report to your doctor or health care professional if they continue or are bothersome):  changes in emotions or moods  constipation  diarrhea  loss of appetite  headache  irritation at site where injected  nausea This list may not describe all possible side effects. Call your doctor for medical advice about side effects. You may report side effects to FDA at 1-800-FDA-1088. Where should I keep my medicine? This drug is given in a hospital or clinic and will not be stored at home. NOTE: This sheet is a summary. It may not cover all possible information. If you have questions about this medicine, talk to your doctor, pharmacist, or health care provider.  2020 Elsevier/Gold Standard (2018-01-20 16:29:31)  

## 2020-05-19 NOTE — Progress Notes (Signed)
Ok to treat with creatinine of 1.65 per Dr Marin Olp. dph

## 2020-05-20 ENCOUNTER — Ambulatory Visit: Payer: Medicare Other

## 2020-05-20 ENCOUNTER — Other Ambulatory Visit (HOSPITAL_COMMUNITY)
Admission: RE | Admit: 2020-05-20 | Discharge: 2020-05-20 | Disposition: A | Discharge status: Home / Self Care | Payer: Medicare Other | Source: Ambulatory Visit | Attending: Internal Medicine | Admitting: Internal Medicine

## 2020-05-20 ENCOUNTER — Other Ambulatory Visit: Payer: Medicare Other

## 2020-05-20 DIAGNOSIS — Z01812 Encounter for preprocedural laboratory examination: Secondary | ICD-10-CM | POA: Insufficient documentation

## 2020-05-20 DIAGNOSIS — Z20822 Contact with and (suspected) exposure to covid-19: Secondary | ICD-10-CM | POA: Insufficient documentation

## 2020-05-20 LAB — SARS CORONAVIRUS 2 (TAT 6-24 HRS): SARS Coronavirus 2: NEGATIVE

## 2020-05-23 ENCOUNTER — Encounter (HOSPITAL_COMMUNITY)
Admission: RE | Disposition: A | Discharge status: Home / Self Care | Payer: Medicare Other | Source: Home / Self Care | Attending: Internal Medicine

## 2020-05-23 ENCOUNTER — Ambulatory Visit (HOSPITAL_COMMUNITY)
Admission: RE | Admit: 2020-05-23 | Discharge: 2020-05-23 | Disposition: A | Discharge status: Home / Self Care | Payer: Medicare Other | Attending: Internal Medicine | Admitting: Internal Medicine

## 2020-05-23 DIAGNOSIS — Z79899 Other long term (current) drug therapy: Secondary | ICD-10-CM | POA: Diagnosis not present

## 2020-05-23 DIAGNOSIS — Z8249 Family history of ischemic heart disease and other diseases of the circulatory system: Secondary | ICD-10-CM | POA: Diagnosis not present

## 2020-05-23 DIAGNOSIS — Z951 Presence of aortocoronary bypass graft: Secondary | ICD-10-CM | POA: Diagnosis not present

## 2020-05-23 DIAGNOSIS — Z87891 Personal history of nicotine dependence: Secondary | ICD-10-CM | POA: Diagnosis not present

## 2020-05-23 DIAGNOSIS — Z7951 Long term (current) use of inhaled steroids: Secondary | ICD-10-CM | POA: Diagnosis not present

## 2020-05-23 DIAGNOSIS — Z7901 Long term (current) use of anticoagulants: Secondary | ICD-10-CM | POA: Insufficient documentation

## 2020-05-23 DIAGNOSIS — I10 Essential (primary) hypertension: Secondary | ICD-10-CM | POA: Diagnosis not present

## 2020-05-23 DIAGNOSIS — I483 Typical atrial flutter: Secondary | ICD-10-CM | POA: Diagnosis not present

## 2020-05-23 DIAGNOSIS — C9000 Multiple myeloma not having achieved remission: Secondary | ICD-10-CM | POA: Diagnosis not present

## 2020-05-23 DIAGNOSIS — E785 Hyperlipidemia, unspecified: Secondary | ICD-10-CM | POA: Diagnosis not present

## 2020-05-23 DIAGNOSIS — I2510 Atherosclerotic heart disease of native coronary artery without angina pectoris: Secondary | ICD-10-CM | POA: Diagnosis not present

## 2020-05-23 DIAGNOSIS — J45909 Unspecified asthma, uncomplicated: Secondary | ICD-10-CM | POA: Diagnosis not present

## 2020-05-23 HISTORY — PX: A-FLUTTER ABLATION: EP1230

## 2020-05-23 SURGERY — A-FLUTTER ABLATION

## 2020-05-23 MED ORDER — HEPARIN SODIUM (PORCINE) 1000 UNIT/ML IJ SOLN
INTRAMUSCULAR | Status: AC
  Filled 2020-05-23: qty 1

## 2020-05-23 MED ORDER — HEPARIN (PORCINE) IN NACL 1000-0.9 UT/500ML-% IV SOLN
INTRAVENOUS | Status: DC | PRN
  Administered 2020-05-23: 500 mL

## 2020-05-23 MED ORDER — SODIUM CHLORIDE 0.9 % IV SOLN: INTRAVENOUS | Status: DC

## 2020-05-23 MED ORDER — BUPIVACAINE HCL (PF) 0.25 % IJ SOLN
INTRAMUSCULAR | Status: DC | PRN
  Administered 2020-05-23: 60 mL

## 2020-05-23 MED ORDER — FENTANYL CITRATE (PF) 100 MCG/2ML IJ SOLN
INTRAMUSCULAR | Status: AC
  Filled 2020-05-23: qty 2

## 2020-05-23 MED ORDER — BUPIVACAINE HCL (PF) 0.25 % IJ SOLN
INTRAMUSCULAR | Status: AC
  Filled 2020-05-23: qty 60

## 2020-05-23 MED ORDER — MIDAZOLAM HCL 5 MG/5ML IJ SOLN
INTRAMUSCULAR | Status: AC
  Filled 2020-05-23: qty 5

## 2020-05-23 MED ORDER — FENTANYL CITRATE (PF) 100 MCG/2ML IJ SOLN
INTRAMUSCULAR | Status: DC | PRN
Start: 2020-05-23 — End: 2020-05-23
  Administered 2020-05-23: 25 ug via INTRAVENOUS
  Administered 2020-05-23 (×5): 12.5 ug via INTRAVENOUS

## 2020-05-23 MED ORDER — HEPARIN SODIUM (PORCINE) 1000 UNIT/ML IJ SOLN
INTRAMUSCULAR | Status: DC | PRN
  Administered 2020-05-23: 1000 [IU] via INTRAVENOUS

## 2020-05-23 MED ORDER — MIDAZOLAM HCL 5 MG/5ML IJ SOLN
INTRAMUSCULAR | Status: DC | PRN
  Administered 2020-05-23 (×5): 1 mg via INTRAVENOUS
  Administered 2020-05-23: 2 mg via INTRAVENOUS

## 2020-05-23 SURGICAL SUPPLY — 12 items
BAG SNAP BAND KOVER 36X36 (MISCELLANEOUS) ×2 IMPLANT
CATH JOSEPH QUAD ALLRED 6F REP (CATHETERS) ×2 IMPLANT
CATH SMTCH THERMOCOOL SF FJ (CATHETERS) ×2 IMPLANT
CATH WEBSTER BI DIR CS D-F CRV (CATHETERS) ×2 IMPLANT
PACK EP LATEX FREE (CUSTOM PROCEDURE TRAY) ×2
PACK EP LF (CUSTOM PROCEDURE TRAY) ×1 IMPLANT
PAD PRO RADIOLUCENT 2001M-C (PAD) ×2 IMPLANT
PATCH CARTO3 (PAD) ×2 IMPLANT
SHEATH PINNACLE 6F 10CM (SHEATH) ×2 IMPLANT
SHEATH PINNACLE 7F 10CM (SHEATH) ×2 IMPLANT
SHEATH PINNACLE 8F 10CM (SHEATH) ×2 IMPLANT
TUBING SMART ABLATE COOLFLOW (TUBING) ×2 IMPLANT

## 2020-05-23 NOTE — Progress Notes (Signed)
Site Area:  RFV x3 Site prior to removal: Level 0 Pressure applied for:  15 minutes Manual:  yes Pt status during pull:  stable Post pull site:  Level 0 Post pull instructions given:  yes Post pull pulses present:  DP Dressing applied:  Gauze/tegaderm Bedrest begins @:  0940 Comments: Removed by Vear Clock RN

## 2020-05-23 NOTE — Discharge Instructions (Signed)
Post procedure care instructions No driving for 4 days. No lifting over 5 lbs for 1 week. No vigorous or sexual activity for 1 week. You may return to work/your usual activities on 05/30/2020. Keep procedure site clean & dry. If you notice increased pain, swelling, bleeding or pus, call/return!  You may shower, but no soaking baths/hot tubs/pools for 1 week.     Cardiac Ablation, Care After  This sheet gives you information about how to care for yourself after your procedure. Your health care provider may also give you more specific instructions. If you have problems or questions, contact your health care provider. What can I expect after the procedure? After the procedure, it is common to have:  Bruising around your puncture site.  Tenderness around your puncture site.  Skipped heartbeats.  Tiredness (fatigue).  Follow these instructions at home: Puncture site care   Check your puncture site every day for signs of infection. Check for: ? Redness, swelling, or pain. ? Fluid or blood. If your puncture site starts to bleed, lie down on your back, apply firm pressure to the area, and contact your health care provider. ? Warmth. ? Pus or a bad smell. Driving  Do not drive for at least 4 days after your procedure or however long your health care provider recommends. (Do not resume driving if you have previously been instructed not to drive for other health reasons.)  Do not drive or use heavy machinery while taking prescription pain medicine. Activity  Avoid activities that take a lot of effort for at least 7 days after your procedure.  Do not lift anything that is heavier than 5 lb (4.5 kg) for one week.   No sexual activity for 1 week.   Return to your normal activities as told by your health care provider. Ask your health care provider what activities are safe for you. General instructions  Take over-the-counter and prescription medicines only as told by your health care  provider.  Do not use any products that contain nicotine or tobacco, such as cigarettes and e-cigarettes. If you need help quitting, ask your health care provider.  You may shower after 24 hours, but Do not take baths, swim, or use a hot tub for 1 week.   Do not drink alcohol for 24 hours after your procedure.  Keep all follow-up visits as told by your health care provider. This is important. Contact a health care provider if:  You have redness, mild swelling, or pain around your puncture site.  You have fluid or blood coming from your puncture site that stops after applying firm pressure to the area.  Your puncture site feels warm to the touch.  You have pus or a bad smell coming from your puncture site.  You have a fever.  You have chest pain or discomfort that spreads to your neck, jaw, or arm.  You are sweating a lot.  You feel nauseous.  You have a fast or irregular heartbeat.  You have shortness of breath.  You are dizzy or light-headed and feel the need to lie down.  You have pain or numbness in the arm or leg closest to your puncture site. Get help right away if:  Your puncture site suddenly swells.  Your puncture site is bleeding and the bleeding does not stop after applying firm pressure to the area. These symptoms may represent a serious problem that is an emergency. Do not wait to see if the symptoms will go away. Get medical  help right away. Call your local emergency services (911 in the [place] Do not drive yourself to the hospital. Summary  After the procedure, it is normal to have bruising and tenderness at the puncture site in your groin, neck, or forearm.  Check your puncture site every day for signs of infection.  Get help right away if your puncture site is bleeding and the bleeding does not stop after applying firm pressure to the area. This is a medical emergency. This information is not intended to replace advice given to you by your health care  provider. Make sure you discuss any questions you have with your health care provider.

## 2020-05-23 NOTE — Interval H&P Note (Signed)
History and Physical Interval Note:  05/23/2020 7:25 AM  Shawn Bauer [facility]  has presented today for surgery, with the diagnosis of aflutter.  The various methods of treatment have been discussed with the patient and family. After consideration of risks, benefits and other options for treatment, the patient has consented to  Procedure(s): A-FLUTTER ABLATION (N/A) as a surgical intervention.  The patient's history has been reviewed, patient examined, no change in status, stable for surgery.  I have reviewed the patient's chart and labs.  Questions were answered to the patient's satisfaction.     Cristopher Peru

## 2020-05-24 ENCOUNTER — Encounter (HOSPITAL_COMMUNITY): Payer: Self-pay | Admitting: Internal Medicine

## 2020-05-26 ENCOUNTER — Other Ambulatory Visit: Payer: Self-pay | Admitting: Family

## 2020-05-26 DIAGNOSIS — C9000 Multiple myeloma not having achieved remission: Secondary | ICD-10-CM

## 2020-05-27 ENCOUNTER — Inpatient Hospital Stay: Payer: Medicare Other

## 2020-05-27 ENCOUNTER — Inpatient Hospital Stay: Payer: Medicare Other | Attending: Family

## 2020-05-27 ENCOUNTER — Other Ambulatory Visit: Payer: Self-pay

## 2020-05-27 VITALS — BP 132/69 | HR 64

## 2020-05-27 DIAGNOSIS — C9000 Multiple myeloma not having achieved remission: Secondary | ICD-10-CM

## 2020-05-27 DIAGNOSIS — Z79899 Other long term (current) drug therapy: Secondary | ICD-10-CM | POA: Diagnosis not present

## 2020-05-27 DIAGNOSIS — N189 Chronic kidney disease, unspecified: Secondary | ICD-10-CM | POA: Insufficient documentation

## 2020-05-27 DIAGNOSIS — Z7982 Long term (current) use of aspirin: Secondary | ICD-10-CM | POA: Diagnosis not present

## 2020-05-27 DIAGNOSIS — Z5111 Encounter for antineoplastic chemotherapy: Secondary | ICD-10-CM | POA: Diagnosis not present

## 2020-05-27 LAB — CMP (CANCER CENTER ONLY)
ALT: 10 U/L (ref 0–44)
AST: 10 U/L — ABNORMAL LOW (ref 15–41)
Albumin: 3.8 g/dL (ref 3.5–5.0)
Alkaline Phosphatase: 41 U/L (ref 38–126)
Anion gap: 5 (ref 5–15)
BUN: 20 mg/dL (ref 8–23)
CO2: 32 mmol/L (ref 22–32)
Calcium: 9.4 mg/dL (ref 8.9–10.3)
Chloride: 104 mmol/L (ref 98–111)
Creatinine: 1.67 mg/dL — ABNORMAL HIGH (ref 0.61–1.24)
GFR, Est AFR Am: 43 mL/min — ABNORMAL LOW (ref >60)
GFR, Estimated: 37 mL/min — ABNORMAL LOW (ref >60)
Glucose, Bld: 88 mg/dL (ref 70–99)
Potassium: 4.2 mmol/L (ref 3.5–5.1)
Sodium: 141 mmol/L (ref 135–145)
Total Bilirubin: 0.7 mg/dL (ref 0.3–1.2)
Total Protein: 6.1 g/dL — ABNORMAL LOW (ref 6.5–8.1)

## 2020-05-27 LAB — CBC WITH DIFFERENTIAL (CANCER CENTER ONLY)
Abs Immature Granulocytes: 0.11 10*3/uL — ABNORMAL HIGH (ref 0.00–0.07)
Basophils Absolute: 0.1 10*3/uL (ref 0.0–0.1)
Basophils Relative: 1 %
Eosinophils Absolute: 0.5 10*3/uL (ref 0.0–0.5)
Eosinophils Relative: 5 %
HCT: 44.6 % (ref 39.0–52.0)
Hemoglobin: 14.9 g/dL (ref 13.0–17.0)
Immature Granulocytes: 1 %
Lymphocytes Relative: 23 %
Lymphs Abs: 2.8 10*3/uL (ref 0.7–4.0)
MCH: 32.7 pg (ref 26.0–34.0)
MCHC: 33.4 g/dL (ref 30.0–36.0)
MCV: 97.8 fL (ref 80.0–100.0)
Monocytes Absolute: 1.2 10*3/uL — ABNORMAL HIGH (ref 0.1–1.0)
Monocytes Relative: 10 %
Neutro Abs: 7.4 10*3/uL (ref 1.7–7.7)
Neutrophils Relative %: 60 %
Platelet Count: 177 10*3/uL (ref 150–400)
RBC: 4.56 MIL/uL (ref 4.22–5.81)
RDW: 12.5 % (ref 11.5–15.5)
WBC Count: 12.1 10*3/uL — ABNORMAL HIGH (ref 4.0–10.5)
nRBC: 0 % (ref 0.0–0.2)

## 2020-05-27 MED ORDER — DEXAMETHASONE 4 MG PO TABS
ORAL_TABLET | ORAL | Status: AC
  Filled 2020-05-27: qty 5

## 2020-05-27 MED ORDER — PROCHLORPERAZINE MALEATE 10 MG PO TABS
10.0000 mg | ORAL_TABLET | Freq: Once | ORAL | Status: AC
  Administered 2020-05-27: 10 mg via ORAL

## 2020-05-27 MED ORDER — BORTEZOMIB CHEMO SQ INJECTION 3.5 MG (2.5MG/ML)
1.3000 mg/m2 | Freq: Once | INTRAMUSCULAR | Status: AC
  Administered 2020-05-27: 2.25 mg via SUBCUTANEOUS
  Filled 2020-05-27: qty 0.9

## 2020-05-27 MED ORDER — PROCHLORPERAZINE MALEATE 10 MG PO TABS
ORAL_TABLET | ORAL | Status: AC
  Filled 2020-05-27: qty 1

## 2020-05-27 MED ORDER — DEXAMETHASONE 4 MG PO TABS
20.0000 mg | ORAL_TABLET | ORAL | Status: DC
  Administered 2020-05-27: 20 mg via ORAL

## 2020-05-27 NOTE — Patient Instructions (Signed)
Bortezomib injection What is this medicine? BORTEZOMIB (bor TEZ oh mib) is a medicine that targets proteins in cancer cells and stops the cancer cells from growing. It is used to treat multiple myeloma and mantle-cell lymphoma. This medicine may be used for other purposes; ask your health care provider or pharmacist if you have questions. COMMON BRAND NAME(S): Velcade What should I tell my health care provider before I take this medicine? They need to know if you have any of these conditions:  diabetes  heart disease  irregular heartbeat  liver disease  on hemodialysis  low blood counts, like low white blood cells, platelets, or hemoglobin  peripheral neuropathy  taking medicine for blood pressure  an unusual or allergic reaction to bortezomib, mannitol, boron, other medicines, foods, dyes, or preservatives  pregnant or trying to get pregnant  breast-feeding How should I use this medicine? This medicine is for injection into a vein or for injection under the skin. It is given by a health care professional in a hospital or clinic setting. Talk to your pediatrician regarding the use of this medicine in children. Special care may be needed. Overdosage: If you think you have taken too much of this medicine contact a poison control center or emergency room at once. NOTE: This medicine is only for you. Do not share this medicine with others. What if I miss a dose? It is important not to miss your dose. Call your doctor or health care professional if you are unable to keep an appointment. What may interact with this medicine? This medicine may interact with the following medications:  ketoconazole  rifampin  ritonavir  St. John's Wort This list may not describe all possible interactions. Give your health care provider a list of all the medicines, herbs, non-prescription drugs, or dietary supplements you use. Also tell them if you smoke, drink alcohol, or use illegal drugs. Some  items may interact with your medicine. What should I watch for while using this medicine? You may get drowsy or dizzy. Do not drive, use machinery, or do anything that needs mental alertness until you know how this medicine affects you. Do not stand or sit up quickly, especially if you are an older patient. This reduces the risk of dizzy or fainting spells. In some cases, you may be given additional medicines to help with side effects. Follow all directions for their use. Call your doctor or health care professional for advice if you get a fever, chills or sore throat, or other symptoms of a cold or flu. Do not treat yourself. This drug decreases your body's ability to fight infections. Try to avoid being around people who are sick. This medicine may increase your risk to bruise or bleed. Call your doctor or health care professional if you notice any unusual bleeding. You may need blood work done while you are taking this medicine. In some patients, this medicine may cause a serious brain infection that may cause death. If you have any problems seeing, thinking, speaking, walking, or standing, tell your doctor right away. If you cannot reach your doctor, urgently seek other source of medical care. Check with your doctor or health care professional if you get an attack of severe diarrhea, nausea and vomiting, or if you sweat a lot. The loss of too much body fluid can make it dangerous for you to take this medicine. Do not become pregnant while taking this medicine or for at least 7 months after stopping it. Women should inform their doctor   if they wish to become pregnant or think they might be pregnant. Men should not father a child while taking this medicine and for at least 4 months after stopping it. There is a potential for serious side effects to an unborn child. Talk to your health care professional or pharmacist for more information. Do not breast-feed an infant while taking this medicine or for 2  months after stopping it. This medicine may interfere with the ability to have a child. You should talk with your doctor or health care professional if you are concerned about your fertility. What side effects may I notice from receiving this medicine? Side effects that you should report to your doctor or health care professional as soon as possible:  allergic reactions like skin rash, itching or hives, swelling of the face, lips, or tongue  breathing problems  changes in hearing  changes in vision  fast, irregular heartbeat  feeling faint or lightheaded, falls  pain, tingling, numbness in the hands or feet  right upper belly pain  seizures  swelling of the ankles, feet, hands  unusual bleeding or bruising  unusually weak or tired  vomiting  yellowing of the eyes or skin Side effects that usually do not require medical attention (report to your doctor or health care professional if they continue or are bothersome):  changes in emotions or moods  constipation  diarrhea  loss of appetite  headache  irritation at site where injected  nausea This list may not describe all possible side effects. Call your doctor for medical advice about side effects. You may report side effects to FDA at 1-800-FDA-1088. Where should I keep my medicine? This drug is given in a hospital or clinic and will not be stored at home. NOTE: This sheet is a summary. It may not cover all possible information. If you have questions about this medicine, talk to your doctor, pharmacist, or health care provider.  2020 Elsevier/Gold Standard (2018-01-20 16:29:31)  

## 2020-06-10 ENCOUNTER — Inpatient Hospital Stay: Payer: Medicare Other

## 2020-06-10 ENCOUNTER — Other Ambulatory Visit: Payer: Self-pay

## 2020-06-10 VITALS — BP 128/60 | HR 62 | Temp 98.1°F | Resp 18

## 2020-06-10 DIAGNOSIS — C9000 Multiple myeloma not having achieved remission: Secondary | ICD-10-CM

## 2020-06-10 DIAGNOSIS — Z5111 Encounter for antineoplastic chemotherapy: Secondary | ICD-10-CM | POA: Diagnosis not present

## 2020-06-10 LAB — CBC WITH DIFFERENTIAL (CANCER CENTER ONLY)
Abs Immature Granulocytes: 0.05 10*3/uL (ref 0.00–0.07)
Basophils Absolute: 0.1 10*3/uL (ref 0.0–0.1)
Basophils Relative: 1 %
Eosinophils Absolute: 0.4 10*3/uL (ref 0.0–0.5)
Eosinophils Relative: 5 %
HCT: 42.1 % (ref 39.0–52.0)
Hemoglobin: 13.6 g/dL (ref 13.0–17.0)
Immature Granulocytes: 1 %
Lymphocytes Relative: 21 %
Lymphs Abs: 1.8 10*3/uL (ref 0.7–4.0)
MCH: 32.3 pg (ref 26.0–34.0)
MCHC: 32.3 g/dL (ref 30.0–36.0)
MCV: 100 fL (ref 80.0–100.0)
Monocytes Absolute: 0.8 10*3/uL (ref 0.1–1.0)
Monocytes Relative: 9 %
Neutro Abs: 5.5 10*3/uL (ref 1.7–7.7)
Neutrophils Relative %: 63 %
Platelet Count: 260 10*3/uL (ref 150–400)
RBC: 4.21 MIL/uL — ABNORMAL LOW (ref 4.22–5.81)
RDW: 12.4 % (ref 11.5–15.5)
WBC Count: 8.6 10*3/uL (ref 4.0–10.5)
nRBC: 0 % (ref 0.0–0.2)

## 2020-06-10 LAB — CMP (CANCER CENTER ONLY)
ALT: 10 U/L (ref 0–44)
AST: 12 U/L — ABNORMAL LOW (ref 15–41)
Albumin: 3.9 g/dL (ref 3.5–5.0)
Alkaline Phosphatase: 45 U/L (ref 38–126)
Anion gap: 5 (ref 5–15)
BUN: 23 mg/dL (ref 8–23)
CO2: 32 mmol/L (ref 22–32)
Calcium: 9.4 mg/dL (ref 8.9–10.3)
Chloride: 104 mmol/L (ref 98–111)
Creatinine: 1.67 mg/dL — ABNORMAL HIGH (ref 0.61–1.24)
GFR, Est AFR Am: 43 mL/min — ABNORMAL LOW (ref >60)
GFR, Estimated: 37 mL/min — ABNORMAL LOW (ref >60)
Glucose, Bld: 106 mg/dL — ABNORMAL HIGH (ref 70–99)
Potassium: 4.5 mmol/L (ref 3.5–5.1)
Sodium: 141 mmol/L (ref 135–145)
Total Bilirubin: 0.7 mg/dL (ref 0.3–1.2)
Total Protein: 6.5 g/dL (ref 6.5–8.1)

## 2020-06-10 MED ORDER — DEXAMETHASONE 4 MG PO TABS
20.0000 mg | ORAL_TABLET | ORAL | Status: DC
  Administered 2020-06-10: 20 mg via ORAL

## 2020-06-10 MED ORDER — DEXAMETHASONE 4 MG PO TABS
ORAL_TABLET | ORAL | Status: AC
  Filled 2020-06-10: qty 5

## 2020-06-10 MED ORDER — PROCHLORPERAZINE MALEATE 10 MG PO TABS
ORAL_TABLET | ORAL | Status: AC
  Filled 2020-06-10: qty 1

## 2020-06-10 MED ORDER — PROCHLORPERAZINE MALEATE 10 MG PO TABS
10.0000 mg | ORAL_TABLET | Freq: Once | ORAL | Status: AC
  Administered 2020-06-10: 10 mg via ORAL

## 2020-06-10 MED ORDER — BORTEZOMIB CHEMO SQ INJECTION 3.5 MG (2.5MG/ML)
1.3000 mg/m2 | Freq: Once | INTRAMUSCULAR | Status: AC
  Administered 2020-06-10: 2.25 mg via SUBCUTANEOUS
  Filled 2020-06-10: qty 0.9

## 2020-06-10 NOTE — Patient Instructions (Signed)
Bortezomib injection What is this medicine? BORTEZOMIB (bor TEZ oh mib) is a medicine that targets proteins in cancer cells and stops the cancer cells from growing. It is used to treat multiple myeloma and mantle-cell lymphoma. This medicine may be used for other purposes; ask your health care provider or pharmacist if you have questions. COMMON BRAND NAME(S): Velcade What should I tell my health care provider before I take this medicine? They need to know if you have any of these conditions:  diabetes  heart disease  irregular heartbeat  liver disease  on hemodialysis  low blood counts, like low white blood cells, platelets, or hemoglobin  peripheral neuropathy  taking medicine for blood pressure  an unusual or allergic reaction to bortezomib, mannitol, boron, other medicines, foods, dyes, or preservatives  pregnant or trying to get pregnant  breast-feeding How should I use this medicine? This medicine is for injection into a vein or for injection under the skin. It is given by a health care professional in a hospital or clinic setting. Talk to your pediatrician regarding the use of this medicine in children. Special care may be needed. Overdosage: If you think you have taken too much of this medicine contact a poison control center or emergency room at once. NOTE: This medicine is only for you. Do not share this medicine with others. What if I miss a dose? It is important not to miss your dose. Call your doctor or health care professional if you are unable to keep an appointment. What may interact with this medicine? This medicine may interact with the following medications:  ketoconazole  rifampin  ritonavir  St. John's Wort This list may not describe all possible interactions. Give your health care provider a list of all the medicines, herbs, non-prescription drugs, or dietary supplements you use. Also tell them if you smoke, drink alcohol, or use illegal drugs. Some  items may interact with your medicine. What should I watch for while using this medicine? You may get drowsy or dizzy. Do not drive, use machinery, or do anything that needs mental alertness until you know how this medicine affects you. Do not stand or sit up quickly, especially if you are an older patient. This reduces the risk of dizzy or fainting spells. In some cases, you may be given additional medicines to help with side effects. Follow all directions for their use. Call your doctor or health care professional for advice if you get a fever, chills or sore throat, or other symptoms of a cold or flu. Do not treat yourself. This drug decreases your body's ability to fight infections. Try to avoid being around people who are sick. This medicine may increase your risk to bruise or bleed. Call your doctor or health care professional if you notice any unusual bleeding. You may need blood work done while you are taking this medicine. In some patients, this medicine may cause a serious brain infection that may cause death. If you have any problems seeing, thinking, speaking, walking, or standing, tell your doctor right away. If you cannot reach your doctor, urgently seek other source of medical care. Check with your doctor or health care professional if you get an attack of severe diarrhea, nausea and vomiting, or if you sweat a lot. The loss of too much body fluid can make it dangerous for you to take this medicine. Do not become pregnant while taking this medicine or for at least 7 months after stopping it. Women should inform their doctor   if they wish to become pregnant or think they might be pregnant. Men should not father a child while taking this medicine and for at least 4 months after stopping it. There is a potential for serious side effects to an unborn child. Talk to your health care professional or pharmacist for more information. Do not breast-feed an infant while taking this medicine or for 2  months after stopping it. This medicine may interfere with the ability to have a child. You should talk with your doctor or health care professional if you are concerned about your fertility. What side effects may I notice from receiving this medicine? Side effects that you should report to your doctor or health care professional as soon as possible:  allergic reactions like skin rash, itching or hives, swelling of the face, lips, or tongue  breathing problems  changes in hearing  changes in vision  fast, irregular heartbeat  feeling faint or lightheaded, falls  pain, tingling, numbness in the hands or feet  right upper belly pain  seizures  swelling of the ankles, feet, hands  unusual bleeding or bruising  unusually weak or tired  vomiting  yellowing of the eyes or skin Side effects that usually do not require medical attention (report to your doctor or health care professional if they continue or are bothersome):  changes in emotions or moods  constipation  diarrhea  loss of appetite  headache  irritation at site where injected  nausea This list may not describe all possible side effects. Call your doctor for medical advice about side effects. You may report side effects to FDA at 1-800-FDA-1088. Where should I keep my medicine? This drug is given in a hospital or clinic and will not be stored at home. NOTE: This sheet is a summary. It may not cover all possible information. If you have questions about this medicine, talk to your doctor, pharmacist, or health care provider.  2020 Elsevier/Gold Standard (2018-01-20 16:29:31)  

## 2020-06-10 NOTE — Progress Notes (Signed)
Labs reviewed with Dr Marin Olp.  Ok to treat today

## 2020-06-11 LAB — IGG, IGA, IGM
IgA: 130 mg/dL (ref 61–437)
IgG (Immunoglobin G), Serum: 668 mg/dL (ref 603–1613)
IgM (Immunoglobulin M), Srm: 25 mg/dL (ref 15–143)

## 2020-06-13 LAB — KAPPA/LAMBDA LIGHT CHAINS
Kappa free light chain: 243.6 mg/L — ABNORMAL HIGH (ref 3.3–19.4)
Kappa, lambda light chain ratio: 19.33 — ABNORMAL HIGH (ref 0.26–1.65)
Lambda free light chains: 12.6 mg/L (ref 5.7–26.3)

## 2020-06-13 LAB — PROTEIN ELECTROPHORESIS, SERUM
A/G Ratio: 1.4 (ref 0.7–1.7)
Albumin ELP: 3.4 g/dL (ref 2.9–4.4)
Alpha-1-Globulin: 0.2 g/dL (ref 0.0–0.4)
Alpha-2-Globulin: 0.8 g/dL (ref 0.4–1.0)
Beta Globulin: 0.8 g/dL (ref 0.7–1.3)
Gamma Globulin: 0.7 g/dL (ref 0.4–1.8)
Globulin, Total: 2.5 g/dL (ref 2.2–3.9)
Total Protein ELP: 5.9 g/dL — ABNORMAL LOW (ref 6.0–8.5)

## 2020-06-15 ENCOUNTER — Other Ambulatory Visit: Payer: Self-pay | Admitting: *Deleted

## 2020-06-15 DIAGNOSIS — C9000 Multiple myeloma not having achieved remission: Secondary | ICD-10-CM

## 2020-06-16 ENCOUNTER — Inpatient Hospital Stay: Payer: Medicare Other

## 2020-06-16 ENCOUNTER — Other Ambulatory Visit: Payer: Self-pay

## 2020-06-16 ENCOUNTER — Encounter: Payer: Self-pay | Admitting: Hematology & Oncology

## 2020-06-16 ENCOUNTER — Inpatient Hospital Stay (HOSPITAL_BASED_OUTPATIENT_CLINIC_OR_DEPARTMENT_OTHER): Payer: Medicare Other | Admitting: Hematology & Oncology

## 2020-06-16 ENCOUNTER — Telehealth: Payer: Self-pay | Admitting: Hematology & Oncology

## 2020-06-16 VITALS — BP 131/58 | HR 78 | Temp 97.9°F | Resp 20 | Wt 139.1 lb

## 2020-06-16 DIAGNOSIS — C9000 Multiple myeloma not having achieved remission: Secondary | ICD-10-CM

## 2020-06-16 DIAGNOSIS — I25119 Atherosclerotic heart disease of native coronary artery with unspecified angina pectoris: Secondary | ICD-10-CM | POA: Diagnosis not present

## 2020-06-16 DIAGNOSIS — Z5111 Encounter for antineoplastic chemotherapy: Secondary | ICD-10-CM | POA: Diagnosis not present

## 2020-06-16 LAB — CBC WITH DIFFERENTIAL (CANCER CENTER ONLY)
Abs Immature Granulocytes: 0.06 10*3/uL (ref 0.00–0.07)
Basophils Absolute: 0.1 10*3/uL (ref 0.0–0.1)
Basophils Relative: 1 %
Eosinophils Absolute: 0.4 10*3/uL (ref 0.0–0.5)
Eosinophils Relative: 5 %
HCT: 40.6 % (ref 39.0–52.0)
Hemoglobin: 13.4 g/dL (ref 13.0–17.0)
Immature Granulocytes: 1 %
Lymphocytes Relative: 26 %
Lymphs Abs: 2.2 10*3/uL (ref 0.7–4.0)
MCH: 33 pg (ref 26.0–34.0)
MCHC: 33 g/dL (ref 30.0–36.0)
MCV: 100 fL (ref 80.0–100.0)
Monocytes Absolute: 1 10*3/uL (ref 0.1–1.0)
Monocytes Relative: 12 %
Neutro Abs: 4.7 10*3/uL (ref 1.7–7.7)
Neutrophils Relative %: 55 %
Platelet Count: 184 10*3/uL (ref 150–400)
RBC: 4.06 MIL/uL — ABNORMAL LOW (ref 4.22–5.81)
RDW: 12.3 % (ref 11.5–15.5)
WBC Count: 8.4 10*3/uL (ref 4.0–10.5)
nRBC: 0 % (ref 0.0–0.2)

## 2020-06-16 LAB — CMP (CANCER CENTER ONLY)
ALT: 15 U/L (ref 0–44)
AST: 19 U/L (ref 15–41)
Albumin: 3.6 g/dL (ref 3.5–5.0)
Alkaline Phosphatase: 42 U/L (ref 38–126)
Anion gap: 9 (ref 5–15)
BUN: 21 mg/dL (ref 8–23)
CO2: 28 mmol/L (ref 22–32)
Calcium: 8.9 mg/dL (ref 8.9–10.3)
Chloride: 102 mmol/L (ref 98–111)
Creatinine: 1.7 mg/dL — ABNORMAL HIGH (ref 0.61–1.24)
GFR, Est AFR Am: 42 mL/min — ABNORMAL LOW (ref >60)
GFR, Estimated: 36 mL/min — ABNORMAL LOW (ref >60)
Glucose, Bld: 98 mg/dL (ref 70–99)
Potassium: 5 mmol/L (ref 3.5–5.1)
Sodium: 139 mmol/L (ref 135–145)
Total Bilirubin: 0.6 mg/dL (ref 0.3–1.2)
Total Protein: 6.2 g/dL — ABNORMAL LOW (ref 6.5–8.1)

## 2020-06-16 MED ORDER — PROCHLORPERAZINE MALEATE 10 MG PO TABS
ORAL_TABLET | ORAL | Status: AC
  Filled 2020-06-16: qty 1

## 2020-06-16 MED ORDER — DEXAMETHASONE 4 MG PO TABS
20.0000 mg | ORAL_TABLET | ORAL | Status: DC
  Administered 2020-06-16: 20 mg via ORAL

## 2020-06-16 MED ORDER — PROCHLORPERAZINE MALEATE 10 MG PO TABS
10.0000 mg | ORAL_TABLET | Freq: Once | ORAL | Status: AC
  Administered 2020-06-16: 10 mg via ORAL

## 2020-06-16 MED ORDER — BORTEZOMIB CHEMO SQ INJECTION 3.5 MG (2.5MG/ML)
1.3000 mg/m2 | Freq: Once | INTRAMUSCULAR | Status: AC
  Administered 2020-06-16: 2.25 mg via SUBCUTANEOUS
  Filled 2020-06-16: qty 0.9

## 2020-06-16 MED ORDER — DEXAMETHASONE 4 MG PO TABS
ORAL_TABLET | ORAL | Status: AC
  Filled 2020-06-16: qty 5

## 2020-06-16 NOTE — Progress Notes (Signed)
Hematology and Oncology Follow Up Visit  Shawn Bauer [facility] [id number] Apr 04, 1934 86 y.o. 06/16/2020   Principle Diagnosis:  IgG Kappa myeloma -- high risk due to t(4:21)  Current Therapy: Velcade/Decadron --started01/15/2021, s/p cycle 7 Xgeva 120 mg IM q 3 months -- next dose on 06/2020   Interim History:  [name] is here today for follow-up and treatment.  He did undergo cardiac ablation for the atrial flutter.  This was done on August 30.  He tolerated this pretty well.  His last light chains for a kappa light chain back on 06/10/2020 was 24.4 mg/dL.  This is holding pretty stable.  There is no M spike.  His IgG level was 660 mg/dL.  I am little bit troubled by the fact that his light chains are not improving on single agent Velcade.  We may have to add something to the Velcade.  He has had no problems with pain.  There is no nausea or vomiting.  He has had no change in bowel or bladder habits.  Has had no bleeding.  Overall, his performance status is ECOG 1.   Medications:  Allergies as of 06/16/2020   No Known Allergies     Medication List       Accurate as of June 16, 2020 11:50 AM. If you have any questions, ask your nurse or doctor.        STOP taking these medications   apixaban 2.5 MG Tabs tablet Commonly known as: ELIQUIS Stopped by: [name], MD     TAKE these medications   aspirin EC 81 MG tablet Take 81 mg by mouth daily. Swallow whole.   budesonide-formoterol 160-4.5 MCG/ACT inhaler Commonly known as: SYMBICORT Inhale 2 puffs into the lungs 2 (two) times daily.   CINNAMON PO Take 1,000 mg by mouth daily.   cyanocobalamin 1000 MCG tablet Take 1,000 mcg by mouth daily.   lovastatin 40 MG tablet Commonly known as: MEVACOR Take 80 mg by mouth daily.   metoprolol succinate 50 MG 24 hr tablet Commonly known as: TOPROL-XL Take 25 mg by mouth in the morning and at bedtime.   nitroGLYCERIN 0.4 MG SL tablet Commonly known as:  NITROSTAT Place 1 tablet (0.4 mg total) under the tongue every 5 (five) minutes as needed for chest pain.   olmesartan 20 MG tablet Commonly known as: BENICAR Take 20 mg by mouth daily.   traMADol 50 MG tablet Commonly known as: ULTRAM Take 50 mg by mouth in the morning and at bedtime.       Allergies: No Known Allergies  Past Medical History, Surgical history, Social history, and Family History were reviewed and updated.  Review of Systems: Review of Systems  Constitutional: Negative.   HENT: Negative.   Eyes: Negative.   Respiratory: Negative.   Cardiovascular: Negative.   Gastrointestinal: Negative.   Genitourinary: Negative.   Musculoskeletal: Negative.   Skin: Negative.   Neurological: Negative.   Endo/Heme/Allergies: Negative.   Psychiatric/Behavioral: Negative.      Physical Exam:  weight is 139 lb 1.3 oz (63.1 kg). His oral temperature is 97.9 F (36.6 C). His blood pressure is 131/58 (abnormal) and his pulse is 78. His respiration is 20 and oxygen saturation is 97%.   Wt Readings from Last 3 Encounters:  06/16/20 139 lb 1.3 oz (63.1 kg)  05/23/20 141 lb (64 kg)  05/13/20 141 lb (64 kg)    Physical Exam Vitals reviewed.  HENT:     Head: Normocephalic and atraumatic.  Eyes:     Pupils: Pupils are equal, round, and reactive to light.  Cardiovascular:     Rate and Rhythm: Normal rate and regular rhythm.     Heart sounds: Normal heart sounds.  Pulmonary:     Effort: Pulmonary effort is normal.     Breath sounds: Normal breath sounds.  Abdominal:     General: Bowel sounds are normal.     Palpations: Abdomen is soft.  Musculoskeletal:        General: No tenderness or deformity. Normal range of motion.     Cervical back: Normal range of motion.  Lymphadenopathy:     Cervical: No cervical adenopathy.  Skin:    General: Skin is warm and dry.     Findings: No erythema or rash.  Neurological:     Mental Status: He is alert and oriented to person,  place, and time.  Psychiatric:        Behavior: Behavior normal.        Thought Content: Thought content normal.        Judgment: Judgment normal.      Lab Results  Component Value Date   WBC 8.4 06/16/2020   HGB 13.4 06/16/2020   HCT 40.6 06/16/2020   MCV 100.0 06/16/2020   PLT 184 06/16/2020   No results found for: FERRITIN, IRON, TIBC, UIBC, IRONPCTSAT Lab Results  Component Value Date   RBC 4.06 (L) 06/16/2020   Lab Results  Component Value Date   KPAFRELGTCHN 243.6 (H) 06/10/2020   LAMBDASER 12.6 06/10/2020   KAPLAMBRATIO 19.33 (H) 06/10/2020   Lab Results  Component Value Date   IGGSERUM 668 06/10/2020   IGA 130 06/10/2020   IGMSERUM 25 06/10/2020   Lab Results  Component Value Date   TOTALPROTELP 5.9 (L) 06/10/2020   ALBUMINELP 3.4 06/10/2020   A1GS 0.2 06/10/2020   A2GS 0.8 06/10/2020   BETS 0.8 06/10/2020   GAMS 0.7 06/10/2020   MSPIKE Not Observed 06/10/2020   SPEI Comment 06/10/2020     Chemistry      Component Value Date/Time   NA 139 06/16/2020 1034   K 5.0 06/16/2020 1034   CL 102 06/16/2020 1034   CO2 28 06/16/2020 1034   BUN 21 06/16/2020 1034   CREATININE 1.70 (H) 06/16/2020 1034      Component Value Date/Time   CALCIUM 8.9 06/16/2020 1034   ALKPHOS 42 06/16/2020 1034   AST 19 06/16/2020 1034   ALT 15 06/16/2020 1034   BILITOT 0.6 06/16/2020 1034       Impression and Plan: [name] is a very pleasant 86 yo caucasian gentleman with IgG kappa myeloma.   Again, he is on weekly Velcade for 3 weeks on and 1 week off.  If I do not see a significant improvement in the kappa light chain level, I really may have to think about adding Revlimid or pomalidomide to the Velcade.  I know he is asymptomatic right now.  He has chronic renal insufficiency.,  Sure of decreasing the light chains will help with the chronic renal insufficiency but I just do not want to see his renal function worsened.  I will see him back in another few  weeks.  [name], MD 9/23/202111:50 AM

## 2020-06-16 NOTE — Patient Instructions (Signed)
[facility] Discharge Instructions for Patients Receiving Chemotherapy  Today you received the following chemotherapy agents Velcade  To help prevent nausea and vomiting after your treatment, we encourage you to take your nausea medication as prescribed by MD.   If you develop nausea and vomiting that is not controlled by your nausea medication, call the clinic.   BELOW ARE SYMPTOMS THAT SHOULD BE REPORTED IMMEDIATELY:  *FEVER GREATER THAN 100.5 F  *CHILLS WITH OR WITHOUT FEVER  NAUSEA AND VOMITING THAT IS NOT CONTROLLED WITH YOUR NAUSEA MEDICATION  *UNUSUAL SHORTNESS OF BREATH  *UNUSUAL BRUISING OR BLEEDING  TENDERNESS IN MOUTH AND THROAT WITH OR WITHOUT PRESENCE OF ULCERS  *URINARY PROBLEMS  *BOWEL PROBLEMS  UNUSUAL RASH Items with * indicate a potential emergency and should be followed up as soon as possible.  Feel free to call the clinic should you have any questions or concerns. The clinic phone number is [phone].  Please show the CHEMO ALERT CARD at check-in to the Emergency Department and triage nurse.   

## 2020-06-16 NOTE — Progress Notes (Signed)
Ok to treat with creatinine of 1.7 per Dr Marin Olp. dph

## 2020-06-16 NOTE — Telephone Encounter (Signed)
Appointments scheduled calendar printed per 9/23 los

## 2020-06-21 ENCOUNTER — Ambulatory Visit (INDEPENDENT_AMBULATORY_CARE_PROVIDER_SITE_OTHER): Payer: Medicare Other | Admitting: Internal Medicine

## 2020-06-21 ENCOUNTER — Other Ambulatory Visit: Payer: Self-pay

## 2020-06-21 ENCOUNTER — Encounter: Payer: Self-pay | Admitting: Internal Medicine

## 2020-06-21 VITALS — BP 118/64 | HR 75 | Ht 70.0 in | Wt 140.8 lb

## 2020-06-21 DIAGNOSIS — I25119 Atherosclerotic heart disease of native coronary artery with unspecified angina pectoris: Secondary | ICD-10-CM

## 2020-06-21 DIAGNOSIS — I483 Typical atrial flutter: Secondary | ICD-10-CM | POA: Diagnosis not present

## 2020-06-21 NOTE — Patient Instructions (Addendum)

## 2020-06-21 NOTE — Progress Notes (Signed)
HPI [name] returns today for followup. He is a pleasant 86 yo man with a h/o atrial flutter who underwent catheter ablation several weeks ago. He has done well in the interim. He denies chest pain, sob or syncope. He does not have palpitations. No edema. No Known Allergies   Current Outpatient Medications  Medication Sig Dispense Refill  . aspirin EC 81 MG tablet Take 81 mg by mouth daily. Swallow whole.    . budesonide-formoterol (SYMBICORT) 160-4.5 MCG/ACT inhaler Inhale 2 puffs into the lungs 2 (two) times daily.    Marland Kitchen CINNAMON PO Take 1,000 mg by mouth daily.    . cyanocobalamin 1000 MCG tablet Take 1,000 mcg by mouth daily.    Marland Kitchen lovastatin (MEVACOR) 40 MG tablet Take 80 mg by mouth daily.     . metoprolol succinate (TOPROL-XL) 50 MG 24 hr tablet Take 25 mg by mouth in the morning and at bedtime.     . nitroGLYCERIN (NITROSTAT) 0.4 MG SL tablet Place 1 tablet (0.4 mg total) under the tongue every 5 (five) minutes as needed for chest pain. (Patient not taking: Reported on 06/16/2020) 25 tablet 11  . olmesartan (BENICAR) 20 MG tablet Take 20 mg by mouth daily.    . traMADol (ULTRAM) 50 MG tablet Take 50 mg by mouth in the morning and at bedtime.      No current facility-administered medications for this visit.     Past Medical History:  Diagnosis Date  . Asthma   . CAD (coronary artery disease)    s/p CABG  . Degenerative disk disease    cervical/spinal stenosis  . Goals of care, counseling/discussion 09/30/2019  . HTN (hypertension)   . Hyperlipidemia   . Kappa light chain myeloma ([place]) 09/30/2019    ROS:   All systems reviewed and negative except as noted in the HPI.   Past Surgical History:  Procedure Laterality Date  . A-FLUTTER ABLATION N/A 05/23/2020   Procedure: A-FLUTTER ABLATION;  Surgeon: Evans Lance, MD;  Location: [facility];  Service: Cardiovascular;  Laterality: N/A;  . APPENDECTOMY    . BACK SURGERY    . bilateral inguinal hernia repair     . CORONARY ARTERY BYPASS GRAFT  2001   after positive exercise treadmill test.  LIMA-LAD, Radial artery to LCx marginal, SVG to second diagonal, SVG to PDA  . TONSILLECTOMY AND ADENOIDECTOMY       Family History  Problem Relation Age of Onset  . Heart attack Father 58       died  . Heart attack Brother      Social History   Socioeconomic History  . Marital status: Married    Spouse name: Not on file  . Number of children: 3  . Years of education: Not on file  . Highest education level: Not on file  Occupational History  . Occupation: [employer]  Tobacco Use  . Smoking status: Former Smoker    Types: Cigarettes    Quit date: 08/31/1964    Years since quitting: 55.8  . Smokeless tobacco: Former Network engineer  . Vaping Use: Never used  Substance and Sexual Activity  . Alcohol use: No  . Drug use: No  . Sexual activity: Not on file  Other Topics Concern  . Not on file  Social History Narrative  . Not on file   Social Determinants of Health   Financial Resource Strain:   . Difficulty of Paying Living  Expenses: Not on file  Food Insecurity:   . Worried About Charity fundraiser in the Last Year: Not on file  . Ran Out of Food in the Last Year: Not on file  Transportation Needs:   . Lack of Transportation (Medical): Not on file  . Lack of Transportation (Non-Medical): Not on file  Physical Activity:   . Days of Exercise per Week: Not on file  . Minutes of Exercise per Session: Not on file  Stress:   . Feeling of Stress : Not on file  Social Connections:   . Frequency of Communication with Friends and Family: Not on file  . Frequency of Social Gatherings with Friends and Family: Not on file  . Attends Religious Services: Not on file  . Active Member of Clubs or Organizations: Not on file  . Attends Archivist Meetings: Not on file  . Marital Status: Not on file  Intimate Partner Violence:   . Fear of Current or Ex-Partner: Not on file   . Emotionally Abused: Not on file  . Physically Abused: Not on file  . Sexually Abused: Not on file     BP 118/64   Pulse 75   Ht 5\' 10"  (1.778 m)   Wt 140 lb 12.8 oz (63.9 kg)   SpO2 95%   BMI 20.20 kg/m   Physical Exam:  Well appearing NAD HEENT: Unremarkable Neck:  No JVD, no thyromegally Lymphatics:  No adenopathy Back:  No CVA tenderness Lungs:  Clear with no wheezes HEART:  Regular rate rhythm, no murmurs, no rubs, no clicks Abd:  soft, positive bowel sounds, no organomegally, no rebound, no guarding Ext:  2 plus pulses, no edema, no cyanosis, no clubbing Skin:  No rashes no nodules Neuro:  CN II through XII intact, motor grossly intact  EKG - NSR   Assess/Plan: 1. Atrial flutter - he is doing well, s/p catheter ablation. He will undergo watchful waiting. 2. Myeloma - he is followed by Dr. Marin Olp.   3. CAD - he is s/p CABG and denies any anginal symptoms.   Shawn Overlie Bowen Goyal,MD

## 2020-06-24 ENCOUNTER — Other Ambulatory Visit: Payer: Self-pay

## 2020-06-24 ENCOUNTER — Inpatient Hospital Stay: Payer: Medicare Other

## 2020-06-24 ENCOUNTER — Other Ambulatory Visit: Payer: Self-pay | Admitting: *Deleted

## 2020-06-24 ENCOUNTER — Inpatient Hospital Stay: Payer: Medicare Other | Attending: Family

## 2020-06-24 VITALS — BP 115/60 | HR 63 | Temp 97.6°F | Resp 18

## 2020-06-24 DIAGNOSIS — Z7982 Long term (current) use of aspirin: Secondary | ICD-10-CM | POA: Insufficient documentation

## 2020-06-24 DIAGNOSIS — N189 Chronic kidney disease, unspecified: Secondary | ICD-10-CM | POA: Diagnosis not present

## 2020-06-24 DIAGNOSIS — C9000 Multiple myeloma not having achieved remission: Secondary | ICD-10-CM

## 2020-06-24 DIAGNOSIS — Z79899 Other long term (current) drug therapy: Secondary | ICD-10-CM | POA: Diagnosis not present

## 2020-06-24 DIAGNOSIS — Z5111 Encounter for antineoplastic chemotherapy: Secondary | ICD-10-CM | POA: Diagnosis not present

## 2020-06-24 LAB — CBC WITH DIFFERENTIAL (CANCER CENTER ONLY)
Abs Immature Granulocytes: 0.09 10*3/uL — ABNORMAL HIGH (ref 0.00–0.07)
Basophils Absolute: 0.1 10*3/uL (ref 0.0–0.1)
Basophils Relative: 1 %
Eosinophils Absolute: 0.5 10*3/uL (ref 0.0–0.5)
Eosinophils Relative: 5 %
HCT: 39.7 % (ref 39.0–52.0)
Hemoglobin: 13 g/dL (ref 13.0–17.0)
Immature Granulocytes: 1 %
Lymphocytes Relative: 22 %
Lymphs Abs: 2.2 10*3/uL (ref 0.7–4.0)
MCH: 32.6 pg (ref 26.0–34.0)
MCHC: 32.7 g/dL (ref 30.0–36.0)
MCV: 99.5 fL (ref 80.0–100.0)
Monocytes Absolute: 1 10*3/uL (ref 0.1–1.0)
Monocytes Relative: 10 %
Neutro Abs: 6.2 10*3/uL (ref 1.7–7.7)
Neutrophils Relative %: 61 %
Platelet Count: 153 10*3/uL (ref 150–400)
RBC: 3.99 MIL/uL — ABNORMAL LOW (ref 4.22–5.81)
RDW: 12.4 % (ref 11.5–15.5)
WBC Count: 10 10*3/uL (ref 4.0–10.5)
nRBC: 0 % (ref 0.0–0.2)

## 2020-06-24 LAB — CMP (CANCER CENTER ONLY)
ALT: 12 U/L (ref 0–44)
AST: 13 U/L — ABNORMAL LOW (ref 15–41)
Albumin: 3.7 g/dL (ref 3.5–5.0)
Alkaline Phosphatase: 43 U/L (ref 38–126)
Anion gap: 4 — ABNORMAL LOW (ref 5–15)
BUN: 28 mg/dL — ABNORMAL HIGH (ref 8–23)
CO2: 31 mmol/L (ref 22–32)
Calcium: 9.1 mg/dL (ref 8.9–10.3)
Chloride: 104 mmol/L (ref 98–111)
Creatinine: 1.78 mg/dL — ABNORMAL HIGH (ref 0.61–1.24)
GFR, Est AFR Am: 40 mL/min — ABNORMAL LOW (ref >60)
GFR, Estimated: 35 mL/min — ABNORMAL LOW (ref >60)
Glucose, Bld: 103 mg/dL — ABNORMAL HIGH (ref 70–99)
Potassium: 3.9 mmol/L (ref 3.5–5.1)
Sodium: 139 mmol/L (ref 135–145)
Total Bilirubin: 0.7 mg/dL (ref 0.3–1.2)
Total Protein: 5.8 g/dL — ABNORMAL LOW (ref 6.5–8.1)

## 2020-06-24 MED ORDER — PROCHLORPERAZINE MALEATE 10 MG PO TABS
10.0000 mg | ORAL_TABLET | Freq: Once | ORAL | Status: AC
  Administered 2020-06-24: 10 mg via ORAL

## 2020-06-24 MED ORDER — DEXAMETHASONE 4 MG PO TABS
20.0000 mg | ORAL_TABLET | ORAL | Status: DC
  Administered 2020-06-24: 20 mg via ORAL

## 2020-06-24 MED ORDER — BORTEZOMIB CHEMO SQ INJECTION 3.5 MG (2.5MG/ML)
1.3000 mg/m2 | Freq: Once | INTRAMUSCULAR | Status: AC
  Administered 2020-06-24: 2.25 mg via SUBCUTANEOUS
  Filled 2020-06-24: qty 0.9

## 2020-06-24 MED ORDER — DEXAMETHASONE 4 MG PO TABS
ORAL_TABLET | ORAL | Status: AC
  Filled 2020-06-24: qty 5

## 2020-06-24 MED ORDER — PROCHLORPERAZINE MALEATE 10 MG PO TABS
ORAL_TABLET | ORAL | Status: AC
  Filled 2020-06-24: qty 1

## 2020-06-24 NOTE — Progress Notes (Signed)
Reviewed pt labs with Dr. Marin Olp and pt ok to treat with creatinine 1.78

## 2020-06-24 NOTE — Patient Instructions (Signed)
[facility] Discharge Instructions for Patients Receiving Chemotherapy  Today you received the following chemotherapy agents Velcade  To help prevent nausea and vomiting after your treatment, we encourage you to take your nausea medication as prescribed by MD.   If you develop nausea and vomiting that is not controlled by your nausea medication, call the clinic.   BELOW ARE SYMPTOMS THAT SHOULD BE REPORTED IMMEDIATELY:  *FEVER GREATER THAN 100.5 F  *CHILLS WITH OR WITHOUT FEVER  NAUSEA AND VOMITING THAT IS NOT CONTROLLED WITH YOUR NAUSEA MEDICATION  *UNUSUAL SHORTNESS OF BREATH  *UNUSUAL BRUISING OR BLEEDING  TENDERNESS IN MOUTH AND THROAT WITH OR WITHOUT PRESENCE OF ULCERS  *URINARY PROBLEMS  *BOWEL PROBLEMS  UNUSUAL RASH Items with * indicate a potential emergency and should be followed up as soon as possible.  Feel free to call the clinic should you have any questions or concerns. The clinic phone number is [phone].  Please show the [facility] at check-in to the Emergency Department and triage nurse.

## 2020-07-07 ENCOUNTER — Other Ambulatory Visit: Payer: Self-pay

## 2020-07-07 ENCOUNTER — Inpatient Hospital Stay: Payer: Medicare Other

## 2020-07-07 ENCOUNTER — Encounter: Payer: Self-pay | Admitting: Hematology & Oncology

## 2020-07-07 ENCOUNTER — Inpatient Hospital Stay (HOSPITAL_BASED_OUTPATIENT_CLINIC_OR_DEPARTMENT_OTHER): Payer: Medicare Other | Admitting: Hematology & Oncology

## 2020-07-07 VITALS — BP 151/76 | HR 67 | Temp 98.0°F | Resp 18 | Ht 71.0 in | Wt 141.0 lb

## 2020-07-07 DIAGNOSIS — C9000 Multiple myeloma not having achieved remission: Secondary | ICD-10-CM

## 2020-07-07 DIAGNOSIS — I25119 Atherosclerotic heart disease of native coronary artery with unspecified angina pectoris: Secondary | ICD-10-CM

## 2020-07-07 DIAGNOSIS — Z5111 Encounter for antineoplastic chemotherapy: Secondary | ICD-10-CM | POA: Diagnosis not present

## 2020-07-07 LAB — CBC WITH DIFFERENTIAL (CANCER CENTER ONLY)
Abs Immature Granulocytes: 0.03 10*3/uL (ref 0.00–0.07)
Basophils Absolute: 0.1 10*3/uL (ref 0.0–0.1)
Basophils Relative: 1 %
Eosinophils Absolute: 0.4 10*3/uL (ref 0.0–0.5)
Eosinophils Relative: 5 %
HCT: 39.9 % (ref 39.0–52.0)
Hemoglobin: 12.7 g/dL — ABNORMAL LOW (ref 13.0–17.0)
Immature Granulocytes: 0 %
Lymphocytes Relative: 22 %
Lymphs Abs: 1.7 10*3/uL (ref 0.7–4.0)
MCH: 31.8 pg (ref 26.0–34.0)
MCHC: 31.8 g/dL (ref 30.0–36.0)
MCV: 99.8 fL (ref 80.0–100.0)
Monocytes Absolute: 0.8 10*3/uL (ref 0.1–1.0)
Monocytes Relative: 11 %
Neutro Abs: 4.7 10*3/uL (ref 1.7–7.7)
Neutrophils Relative %: 61 %
Platelet Count: 219 10*3/uL (ref 150–400)
RBC: 4 MIL/uL — ABNORMAL LOW (ref 4.22–5.81)
RDW: 12.6 % (ref 11.5–15.5)
WBC Count: 7.7 10*3/uL (ref 4.0–10.5)
nRBC: 0 % (ref 0.0–0.2)

## 2020-07-07 LAB — CMP (CANCER CENTER ONLY)
ALT: 14 U/L (ref 0–44)
AST: 15 U/L (ref 15–41)
Albumin: 3.9 g/dL (ref 3.5–5.0)
Alkaline Phosphatase: 37 U/L — ABNORMAL LOW (ref 38–126)
Anion gap: 5 (ref 5–15)
BUN: 21 mg/dL (ref 8–23)
CO2: 32 mmol/L (ref 22–32)
Calcium: 9.5 mg/dL (ref 8.9–10.3)
Chloride: 105 mmol/L (ref 98–111)
Creatinine: 1.56 mg/dL — ABNORMAL HIGH (ref 0.61–1.24)
GFR, Estimated: 40 mL/min — ABNORMAL LOW (ref >60)
Glucose, Bld: 103 mg/dL — ABNORMAL HIGH (ref 70–99)
Potassium: 4.2 mmol/L (ref 3.5–5.1)
Sodium: 142 mmol/L (ref 135–145)
Total Bilirubin: 0.8 mg/dL (ref 0.3–1.2)
Total Protein: 5.8 g/dL — ABNORMAL LOW (ref 6.5–8.1)

## 2020-07-07 LAB — LACTATE DEHYDROGENASE: LDH: 207 U/L — ABNORMAL HIGH (ref 98–192)

## 2020-07-07 MED ORDER — DEXAMETHASONE 4 MG PO TABS
ORAL_TABLET | ORAL | Status: AC
  Filled 2020-07-07: qty 5

## 2020-07-07 MED ORDER — DENOSUMAB 120 MG/1.7ML ~~LOC~~ SOLN
120.0000 mg | Freq: Once | SUBCUTANEOUS | Status: AC
  Administered 2020-07-07: 120 mg via SUBCUTANEOUS

## 2020-07-07 MED ORDER — DEXAMETHASONE 4 MG PO TABS
20.0000 mg | ORAL_TABLET | ORAL | Status: DC
  Administered 2020-07-07: 20 mg via ORAL

## 2020-07-07 MED ORDER — BORTEZOMIB CHEMO SQ INJECTION 3.5 MG (2.5MG/ML)
1.3000 mg/m2 | Freq: Once | INTRAMUSCULAR | Status: AC
  Administered 2020-07-07: 2.25 mg via SUBCUTANEOUS
  Filled 2020-07-07: qty 0.9

## 2020-07-07 MED ORDER — DENOSUMAB 120 MG/1.7ML ~~LOC~~ SOLN
SUBCUTANEOUS | Status: AC
  Filled 2020-07-07: qty 1.7

## 2020-07-07 MED ORDER — PROCHLORPERAZINE MALEATE 10 MG PO TABS
ORAL_TABLET | ORAL | Status: AC
  Filled 2020-07-07: qty 1

## 2020-07-07 MED ORDER — PROCHLORPERAZINE MALEATE 10 MG PO TABS
10.0000 mg | ORAL_TABLET | Freq: Once | ORAL | Status: AC
  Administered 2020-07-07: 10 mg via ORAL

## 2020-07-07 NOTE — Patient Instructions (Signed)
[facility] Discharge Instructions for Patients Receiving Chemotherapy  Today you received the following chemotherapy agents Velcade  To help prevent nausea and vomiting after your treatment, we encourage you to take your nausea medication as prescribed by MD.   If you develop nausea and vomiting that is not controlled by your nausea medication, call the clinic.   BELOW ARE SYMPTOMS THAT SHOULD BE REPORTED IMMEDIATELY:  *FEVER GREATER THAN 100.5 F  *CHILLS WITH OR WITHOUT FEVER  NAUSEA AND VOMITING THAT IS NOT CONTROLLED WITH YOUR NAUSEA MEDICATION  *UNUSUAL SHORTNESS OF BREATH  *UNUSUAL BRUISING OR BLEEDING  TENDERNESS IN MOUTH AND THROAT WITH OR WITHOUT PRESENCE OF ULCERS  *URINARY PROBLEMS  *BOWEL PROBLEMS  UNUSUAL RASH Items with * indicate a potential emergency and should be followed up as soon as possible.  Feel free to call the clinic should you have any questions or concerns. The clinic phone number is [phone].  Please show the [facility] at check-in to the Emergency Department and triage nurse.

## 2020-07-07 NOTE — Progress Notes (Signed)
Hematology and Oncology Follow Up Visit  Shawn [facility]auer [facility] [id number] 1934/03/11 86 y.o. 07/07/2020   Principle Diagnosis:  IgG Kappa myeloma -- high risk due to t(4:21)  Current Therapy: Velcade/Decadron --started01/15/2021, s/p cycle 7 Xgeva 120 mg IM q 3 months -- next dose on 09/2020   Interim History:  Shawn Bauer is here today for follow-up and treatment.  Overall, she has been doing pretty well.  He has been pretty active.  He has not had any problems with the atrial flutter.  I just wish that his myeloma would improve.  There is no monoclonal spike in his blood.  However, the IgG level is 668 mg/dL.  It is his Kappa light chain that really bothers me.  We checked it back in September, the Kappa light chain was 24.4 mg/dL.     He has had no bony pain.  His appetite is good.  There is no problems with bowels or bladder.  He has had no fever.  He has had no cough or shortness of breath.  Overall, his performance status is ECOG 1.   Medications:  Allergies as of 07/07/2020   No Known Allergies     Medication List       Accurate as of July 07, 2020 12:18 PM. If you have any questions, ask your nurse or doctor.        aspirin EC 81 MG tablet Take 81 mg by mouth daily. Swallow whole.   budesonide-formoterol 160-4.5 MCG/ACT inhaler Commonly known as: SYMBICORT Inhale 2 puffs into the lungs 2 (two) times daily.   CINNAMON PO Take 1,000 mg by mouth daily.   cyanocobalamin 1000 MCG tablet Take 1,000 mcg by mouth daily.   lovastatin 40 MG tablet Commonly known as: MEVACOR Take 80 mg by mouth daily.   metoprolol succinate 50 MG 24 hr tablet Commonly known as: TOPROL-XL Take 25 mg by mouth in the morning and at bedtime.   nitroGLYCERIN 0.4 MG SL tablet Commonly known as: NITROSTAT Place 1 tablet (0.4 mg total) under the tongue every 5 (five) minutes as needed for chest pain.   olmesartan 20 MG tablet Commonly known as: BENICAR Take 20 mg by mouth  daily.   traMADol 50 MG tablet Commonly known as: ULTRAM Take 50 mg by mouth in the morning and at bedtime.       Allergies: No Known Allergies  Past Medical History, Surgical history, Social history, and Family History were reviewed and updated.  Review of Systems: Review of Systems  Constitutional: Negative.   HENT: Negative.   Eyes: Negative.   Respiratory: Negative.   Cardiovascular: Negative.   Gastrointestinal: Negative.   Genitourinary: Negative.   Musculoskeletal: Negative.   Skin: Negative.   Neurological: Negative.   Endo/Heme/Allergies: Negative.   Psychiatric/Behavioral: Negative.      Physical Exam:  height is 5\' 11"  (1.803 m) and weight is 141 lb (64 kg). His oral temperature is 98 F (36.7 C). His blood pressure is 151/76 (abnormal) and his pulse is 67. His respiration is 18 and oxygen saturation is 99%.   Wt Readings from Last 3 Encounters:  07/07/20 141 lb (64 kg)  06/21/20 140 lb 12.8 oz (63.9 kg)  06/16/20 139 lb 1.3 oz (63.1 kg)    Physical Exam Vitals reviewed.  HENT:     Head: Normocephalic and atraumatic.  Eyes:     Pupils: Pupils are equal, round, and reactive to light.  Cardiovascular:     Rate and Rhythm: Normal rate  and regular rhythm.     Heart sounds: Normal heart sounds.  Pulmonary:     Effort: Pulmonary effort is normal.     Breath sounds: Normal breath sounds.  Abdominal:     General: Bowel sounds are normal.     Palpations: Abdomen is soft.  Musculoskeletal:        General: No tenderness or deformity. Normal range of motion.     Cervical back: Normal range of motion.  Lymphadenopathy:     Cervical: No cervical adenopathy.  Skin:    General: Skin is warm and dry.     Findings: No erythema or rash.  Neurological:     Mental Status: He is alert and oriented to person, place, and time.  Psychiatric:        Behavior: Behavior normal.        Thought Content: Thought content normal.        Judgment: Judgment normal.       Lab Results  Component Value Date   WBC 7.7 07/07/2020   HGB 12.7 (L) 07/07/2020   HCT 39.9 07/07/2020   MCV 99.8 07/07/2020   PLT 219 07/07/2020   No results found for: FERRITIN, IRON, TIBC, UIBC, IRONPCTSAT Lab Results  Component Value Date   RBC 4.00 (L) 07/07/2020   Lab Results  Component Value Date   KPAFRELGTCHN 243.6 (H) 06/10/2020   LAMBDASER 12.6 06/10/2020   KAPLAMBRATIO 19.33 (H) 06/10/2020   Lab Results  Component Value Date   IGGSERUM 668 06/10/2020   IGA 130 06/10/2020   IGMSERUM 25 06/10/2020   Lab Results  Component Value Date   TOTALPROTELP 5.9 (L) 06/10/2020   ALBUMINELP 3.4 06/10/2020   A1GS 0.2 06/10/2020   A2GS 0.8 06/10/2020   BETS 0.8 06/10/2020   GAMS 0.7 06/10/2020   MSPIKE Not Observed 06/10/2020   SPEI Comment 06/10/2020     Chemistry      Component Value Date/Time   NA 142 07/07/2020 1022   K 4.2 07/07/2020 1022   CL 105 07/07/2020 1022   CO2 32 07/07/2020 1022   BUN 21 07/07/2020 1022   CREATININE 1.56 (H) 07/07/2020 1022      Component Value Date/Time   CALCIUM 9.5 07/07/2020 1022   ALKPHOS 37 (L) 07/07/2020 1022   AST 15 07/07/2020 1022   ALT 14 07/07/2020 1022   BILITOT 0.8 07/07/2020 1022       Impression and Plan: Shawn Bauer is a very pleasant 85 yo caucasian gentleman with IgG kappa myeloma.   Again, he is on weekly Velcade for 3 weeks on and 1 week off.  If I do not see a significant improvement in the kappa light chain level, I really may have to think about adding Revlimid or pomalidomide to the Velcade.  He will get his Delton See today.  I know he is asymptomatic right now.  He has chronic renal insufficiency.  I will see him back in another few weeks.  [name], MD 10/14/202112:18 PM

## 2020-07-08 LAB — KAPPA/LAMBDA LIGHT CHAINS
Kappa free light chain: 208.7 mg/L — ABNORMAL HIGH (ref 3.3–19.4)
Kappa, lambda light chain ratio: 18.63 — ABNORMAL HIGH (ref 0.26–1.65)
Lambda free light chains: 11.2 mg/L (ref 5.7–26.3)

## 2020-07-08 LAB — IGG, IGA, IGM
IgA: 118 mg/dL (ref 61–437)
IgG (Immunoglobin G), Serum: 713 mg/dL (ref 603–1613)
IgM (Immunoglobulin M), Srm: 24 mg/dL (ref 15–143)

## 2020-07-11 LAB — PROTEIN ELECTROPHORESIS, SERUM, WITH REFLEX
A/G Ratio: 1.3 (ref 0.7–1.7)
Albumin ELP: 3.3 g/dL (ref 2.9–4.4)
Alpha-1-Globulin: 0.2 g/dL (ref 0.0–0.4)
Alpha-2-Globulin: 0.8 g/dL (ref 0.4–1.0)
Beta Globulin: 0.8 g/dL (ref 0.7–1.3)
Gamma Globulin: 0.7 g/dL (ref 0.4–1.8)
Globulin, Total: 2.6 g/dL (ref 2.2–3.9)
Total Protein ELP: 5.9 g/dL — ABNORMAL LOW (ref 6.0–8.5)

## 2020-07-13 ENCOUNTER — Other Ambulatory Visit: Payer: Self-pay | Admitting: *Deleted

## 2020-07-13 DIAGNOSIS — C9000 Multiple myeloma not having achieved remission: Secondary | ICD-10-CM

## 2020-07-14 ENCOUNTER — Other Ambulatory Visit: Payer: Self-pay

## 2020-07-14 ENCOUNTER — Inpatient Hospital Stay: Payer: Medicare Other

## 2020-07-14 VITALS — BP 134/56 | HR 73 | Temp 98.0°F | Resp 17

## 2020-07-14 DIAGNOSIS — Z5111 Encounter for antineoplastic chemotherapy: Secondary | ICD-10-CM | POA: Diagnosis not present

## 2020-07-14 DIAGNOSIS — C9000 Multiple myeloma not having achieved remission: Secondary | ICD-10-CM

## 2020-07-14 LAB — CMP (CANCER CENTER ONLY)
ALT: 13 U/L (ref 0–44)
AST: 14 U/L — ABNORMAL LOW (ref 15–41)
Albumin: 3.6 g/dL (ref 3.5–5.0)
Alkaline Phosphatase: 39 U/L (ref 38–126)
Anion gap: 4 — ABNORMAL LOW (ref 5–15)
BUN: 22 mg/dL (ref 8–23)
CO2: 31 mmol/L (ref 22–32)
Calcium: 9.2 mg/dL (ref 8.9–10.3)
Chloride: 105 mmol/L (ref 98–111)
Creatinine: 1.59 mg/dL — ABNORMAL HIGH (ref 0.61–1.24)
GFR, Estimated: 43 mL/min — ABNORMAL LOW (ref >60)
Glucose, Bld: 107 mg/dL — ABNORMAL HIGH (ref 70–99)
Potassium: 4.8 mmol/L (ref 3.5–5.1)
Sodium: 140 mmol/L (ref 135–145)
Total Bilirubin: 0.7 mg/dL (ref 0.3–1.2)
Total Protein: 6 g/dL — ABNORMAL LOW (ref 6.5–8.1)

## 2020-07-14 LAB — CBC WITH DIFFERENTIAL (CANCER CENTER ONLY)
Abs Immature Granulocytes: 0.06 10*3/uL (ref 0.00–0.07)
Basophils Absolute: 0.1 10*3/uL (ref 0.0–0.1)
Basophils Relative: 1 %
Eosinophils Absolute: 0.4 10*3/uL (ref 0.0–0.5)
Eosinophils Relative: 6 %
HCT: 38.7 % — ABNORMAL LOW (ref 39.0–52.0)
Hemoglobin: 12.5 g/dL — ABNORMAL LOW (ref 13.0–17.0)
Immature Granulocytes: 1 %
Lymphocytes Relative: 27 %
Lymphs Abs: 2 10*3/uL (ref 0.7–4.0)
MCH: 32.3 pg (ref 26.0–34.0)
MCHC: 32.3 g/dL (ref 30.0–36.0)
MCV: 100 fL (ref 80.0–100.0)
Monocytes Absolute: 0.7 10*3/uL (ref 0.1–1.0)
Monocytes Relative: 10 %
Neutro Abs: 4 10*3/uL (ref 1.7–7.7)
Neutrophils Relative %: 55 %
Platelet Count: 167 10*3/uL (ref 150–400)
RBC: 3.87 MIL/uL — ABNORMAL LOW (ref 4.22–5.81)
RDW: 12.6 % (ref 11.5–15.5)
WBC Count: 7.2 10*3/uL (ref 4.0–10.5)
nRBC: 0 % (ref 0.0–0.2)

## 2020-07-14 MED ORDER — PROCHLORPERAZINE MALEATE 10 MG PO TABS
ORAL_TABLET | ORAL | Status: AC
  Filled 2020-07-14: qty 1

## 2020-07-14 MED ORDER — DEXAMETHASONE 4 MG PO TABS
20.0000 mg | ORAL_TABLET | ORAL | Status: DC
  Administered 2020-07-14: 20 mg via ORAL

## 2020-07-14 MED ORDER — BORTEZOMIB CHEMO SQ INJECTION 3.5 MG (2.5MG/ML)
1.3000 mg/m2 | Freq: Once | INTRAMUSCULAR | Status: AC
  Administered 2020-07-14: 2.25 mg via SUBCUTANEOUS
  Filled 2020-07-14: qty 0.9

## 2020-07-14 MED ORDER — PROCHLORPERAZINE MALEATE 10 MG PO TABS
10.0000 mg | ORAL_TABLET | Freq: Once | ORAL | Status: AC
  Administered 2020-07-14: 10 mg via ORAL

## 2020-07-14 MED ORDER — DEXAMETHASONE 4 MG PO TABS
ORAL_TABLET | ORAL | Status: AC
  Filled 2020-07-14: qty 5

## 2020-07-14 NOTE — Patient Instructions (Signed)
[facility] Discharge Instructions for Patients Receiving Chemotherapy  Today you received the following chemotherapy agents Velcade  To help prevent nausea and vomiting after your treatment, we encourage you to take your nausea medication as prescribed by MD.   If you develop nausea and vomiting that is not controlled by your nausea medication, call the clinic.   BELOW ARE SYMPTOMS THAT SHOULD BE REPORTED IMMEDIATELY:  *FEVER GREATER THAN 100.5 F  *CHILLS WITH OR WITHOUT FEVER  NAUSEA AND VOMITING THAT IS NOT CONTROLLED WITH YOUR NAUSEA MEDICATION  *UNUSUAL SHORTNESS OF BREATH  *UNUSUAL BRUISING OR BLEEDING  TENDERNESS IN MOUTH AND THROAT WITH OR WITHOUT PRESENCE OF ULCERS  *URINARY PROBLEMS  *BOWEL PROBLEMS  UNUSUAL RASH Items with * indicate a potential emergency and should be followed up as soon as possible.  Feel free to call the clinic should you have any questions or concerns. The clinic phone number is [phone].  Please show the [facility] at check-in to the Emergency Department and triage nurse.

## 2020-07-21 ENCOUNTER — Inpatient Hospital Stay: Payer: Medicare Other

## 2020-07-21 ENCOUNTER — Other Ambulatory Visit: Payer: Self-pay

## 2020-07-21 ENCOUNTER — Other Ambulatory Visit: Payer: Self-pay | Admitting: *Deleted

## 2020-07-21 VITALS — BP 149/65 | HR 77 | Temp 98.3°F | Resp 18

## 2020-07-21 DIAGNOSIS — Z5111 Encounter for antineoplastic chemotherapy: Secondary | ICD-10-CM | POA: Diagnosis not present

## 2020-07-21 DIAGNOSIS — C9000 Multiple myeloma not having achieved remission: Secondary | ICD-10-CM

## 2020-07-21 LAB — CBC WITH DIFFERENTIAL (CANCER CENTER ONLY)
Abs Immature Granulocytes: 0.08 10*3/uL — ABNORMAL HIGH (ref 0.00–0.07)
Basophils Absolute: 0 10*3/uL (ref 0.0–0.1)
Basophils Relative: 0 %
Eosinophils Absolute: 0.4 10*3/uL (ref 0.0–0.5)
Eosinophils Relative: 4 %
HCT: 41.4 % (ref 39.0–52.0)
Hemoglobin: 13.4 g/dL (ref 13.0–17.0)
Immature Granulocytes: 1 %
Lymphocytes Relative: 20 %
Lymphs Abs: 2 10*3/uL (ref 0.7–4.0)
MCH: 32.4 pg (ref 26.0–34.0)
MCHC: 32.4 g/dL (ref 30.0–36.0)
MCV: 100 fL (ref 80.0–100.0)
Monocytes Absolute: 1.1 10*3/uL — ABNORMAL HIGH (ref 0.1–1.0)
Monocytes Relative: 12 %
Neutro Abs: 6.1 10*3/uL (ref 1.7–7.7)
Neutrophils Relative %: 63 %
Platelet Count: 150 10*3/uL (ref 150–400)
RBC: 4.14 MIL/uL — ABNORMAL LOW (ref 4.22–5.81)
RDW: 12.8 % (ref 11.5–15.5)
WBC Count: 9.6 10*3/uL (ref 4.0–10.5)
nRBC: 0 % (ref 0.0–0.2)

## 2020-07-21 LAB — CMP (CANCER CENTER ONLY)
ALT: 14 U/L (ref 0–44)
AST: 15 U/L (ref 15–41)
Albumin: 3.8 g/dL (ref 3.5–5.0)
Alkaline Phosphatase: 37 U/L — ABNORMAL LOW (ref 38–126)
Anion gap: 5 (ref 5–15)
BUN: 19 mg/dL (ref 8–23)
CO2: 32 mmol/L (ref 22–32)
Calcium: 9.8 mg/dL (ref 8.9–10.3)
Chloride: 104 mmol/L (ref 98–111)
Creatinine: 1.67 mg/dL — ABNORMAL HIGH (ref 0.61–1.24)
GFR, Estimated: 40 mL/min — ABNORMAL LOW (ref >60)
Glucose, Bld: 102 mg/dL — ABNORMAL HIGH (ref 70–99)
Potassium: 4.9 mmol/L (ref 3.5–5.1)
Sodium: 141 mmol/L (ref 135–145)
Total Bilirubin: 1 mg/dL (ref 0.3–1.2)
Total Protein: 6.2 g/dL — ABNORMAL LOW (ref 6.5–8.1)

## 2020-07-21 MED ORDER — BORTEZOMIB CHEMO SQ INJECTION 3.5 MG (2.5MG/ML)
1.3000 mg/m2 | Freq: Once | INTRAMUSCULAR | Status: AC
  Administered 2020-07-21: 2.25 mg via SUBCUTANEOUS
  Filled 2020-07-21: qty 0.9

## 2020-07-21 MED ORDER — DEXAMETHASONE 4 MG PO TABS
ORAL_TABLET | ORAL | Status: AC
  Filled 2020-07-21: qty 5

## 2020-07-21 MED ORDER — PROCHLORPERAZINE MALEATE 10 MG PO TABS
10.0000 mg | ORAL_TABLET | Freq: Once | ORAL | Status: AC
  Administered 2020-07-21: 10 mg via ORAL

## 2020-07-21 MED ORDER — DEXAMETHASONE 4 MG PO TABS
20.0000 mg | ORAL_TABLET | ORAL | Status: DC
  Administered 2020-07-21: 20 mg via ORAL

## 2020-07-21 MED ORDER — PROCHLORPERAZINE MALEATE 10 MG PO TABS
ORAL_TABLET | ORAL | Status: AC
  Filled 2020-07-21: qty 1

## 2020-07-21 NOTE — Patient Instructions (Signed)
[facility] Discharge Instructions for Patients Receiving Chemotherapy  Today you received the following chemotherapy agents Velcade  To help prevent nausea and vomiting after your treatment, we encourage you to take your nausea medication as prescribed by MD.   If you develop nausea and vomiting that is not controlled by your nausea medication, call the clinic.   BELOW ARE SYMPTOMS THAT SHOULD BE REPORTED IMMEDIATELY:  *FEVER GREATER THAN 100.5 F  *CHILLS WITH OR WITHOUT FEVER  NAUSEA AND VOMITING THAT IS NOT CONTROLLED WITH YOUR NAUSEA MEDICATION  *UNUSUAL SHORTNESS OF BREATH  *UNUSUAL BRUISING OR BLEEDING  TENDERNESS IN MOUTH AND THROAT WITH OR WITHOUT PRESENCE OF ULCERS  *URINARY PROBLEMS  *BOWEL PROBLEMS  UNUSUAL RASH Items with * indicate a potential emergency and should be followed up as soon as possible.  Feel free to call the clinic should you have any questions or concerns. The clinic phone number is [phone].  Please show the [facility] at check-in to the Emergency Department and triage nurse.

## 2020-07-21 NOTE — Progress Notes (Signed)
Pt discharged in no apparent distress. Pt left ambulatory without assistance. Pt aware of discharge instructions and verbalized understanding and had no further questions.  

## 2020-08-04 ENCOUNTER — Other Ambulatory Visit: Payer: Self-pay

## 2020-08-04 ENCOUNTER — Encounter: Payer: Self-pay | Admitting: Hematology & Oncology

## 2020-08-04 ENCOUNTER — Inpatient Hospital Stay: Payer: Medicare Other

## 2020-08-04 ENCOUNTER — Inpatient Hospital Stay: Payer: Medicare Other | Attending: Family | Admitting: Hematology & Oncology

## 2020-08-04 VITALS — BP 136/68 | HR 72 | Temp 98.2°F | Resp 18 | Wt 144.0 lb

## 2020-08-04 DIAGNOSIS — Z79899 Other long term (current) drug therapy: Secondary | ICD-10-CM | POA: Insufficient documentation

## 2020-08-04 DIAGNOSIS — C9000 Multiple myeloma not having achieved remission: Secondary | ICD-10-CM | POA: Diagnosis present

## 2020-08-04 DIAGNOSIS — I25119 Atherosclerotic heart disease of native coronary artery with unspecified angina pectoris: Secondary | ICD-10-CM

## 2020-08-04 DIAGNOSIS — Z5112 Encounter for antineoplastic immunotherapy: Secondary | ICD-10-CM | POA: Insufficient documentation

## 2020-08-04 LAB — CBC WITH DIFFERENTIAL (CANCER CENTER ONLY)
Abs Immature Granulocytes: 0.05 10*3/uL (ref 0.00–0.07)
Basophils Absolute: 0.1 10*3/uL (ref 0.0–0.1)
Basophils Relative: 1 %
Eosinophils Absolute: 0.3 10*3/uL (ref 0.0–0.5)
Eosinophils Relative: 4 %
HCT: 40.4 % (ref 39.0–52.0)
Hemoglobin: 13 g/dL (ref 13.0–17.0)
Immature Granulocytes: 1 %
Lymphocytes Relative: 24 %
Lymphs Abs: 1.7 10*3/uL (ref 0.7–4.0)
MCH: 32.3 pg (ref 26.0–34.0)
MCHC: 32.2 g/dL (ref 30.0–36.0)
MCV: 100.2 fL — ABNORMAL HIGH (ref 80.0–100.0)
Monocytes Absolute: 0.7 10*3/uL (ref 0.1–1.0)
Monocytes Relative: 10 %
Neutro Abs: 4.4 10*3/uL (ref 1.7–7.7)
Neutrophils Relative %: 60 %
Platelet Count: 234 10*3/uL (ref 150–400)
RBC: 4.03 MIL/uL — ABNORMAL LOW (ref 4.22–5.81)
RDW: 13.1 % (ref 11.5–15.5)
WBC Count: 7.2 10*3/uL (ref 4.0–10.5)
nRBC: 0 % (ref 0.0–0.2)

## 2020-08-04 LAB — CMP (CANCER CENTER ONLY)
ALT: 13 U/L (ref 0–44)
AST: 15 U/L (ref 15–41)
Albumin: 3.9 g/dL (ref 3.5–5.0)
Alkaline Phosphatase: 40 U/L (ref 38–126)
Anion gap: 5 (ref 5–15)
BUN: 19 mg/dL (ref 8–23)
CO2: 31 mmol/L (ref 22–32)
Calcium: 9.5 mg/dL (ref 8.9–10.3)
Chloride: 105 mmol/L (ref 98–111)
Creatinine: 1.52 mg/dL — ABNORMAL HIGH (ref 0.61–1.24)
GFR, Estimated: 45 mL/min — ABNORMAL LOW (ref >60)
Glucose, Bld: 124 mg/dL — ABNORMAL HIGH (ref 70–99)
Potassium: 4.5 mmol/L (ref 3.5–5.1)
Sodium: 141 mmol/L (ref 135–145)
Total Bilirubin: 0.8 mg/dL (ref 0.3–1.2)
Total Protein: 6.1 g/dL — ABNORMAL LOW (ref 6.5–8.1)

## 2020-08-04 LAB — LACTATE DEHYDROGENASE: LDH: 177 U/L (ref 98–192)

## 2020-08-04 MED ORDER — DEXAMETHASONE 4 MG PO TABS
ORAL_TABLET | ORAL | Status: AC
  Filled 2020-08-04: qty 5

## 2020-08-04 MED ORDER — BORTEZOMIB CHEMO SQ INJECTION 3.5 MG (2.5MG/ML)
1.3000 mg/m2 | Freq: Once | INTRAMUSCULAR | Status: AC
  Administered 2020-08-04: 2.25 mg via SUBCUTANEOUS
  Filled 2020-08-04: qty 0.9

## 2020-08-04 MED ORDER — PROCHLORPERAZINE MALEATE 10 MG PO TABS
ORAL_TABLET | ORAL | Status: AC
  Filled 2020-08-04: qty 1

## 2020-08-04 MED ORDER — PROCHLORPERAZINE MALEATE 10 MG PO TABS
10.0000 mg | ORAL_TABLET | Freq: Once | ORAL | Status: AC
  Administered 2020-08-04: 10 mg via ORAL

## 2020-08-04 MED ORDER — DEXAMETHASONE 4 MG PO TABS
20.0000 mg | ORAL_TABLET | ORAL | Status: DC
  Administered 2020-08-04: 20 mg via ORAL

## 2020-08-04 NOTE — Progress Notes (Signed)
Pt discharged in no apparent distress. Pt left ambulatory without assistance. Pt aware of discharge instructions and verbalized understanding and had no further questions.  

## 2020-08-04 NOTE — Patient Instructions (Signed)
Bortezomib injection What is this medicine? BORTEZOMIB (bor TEZ oh mib) is a medicine that targets proteins in cancer cells and stops the cancer cells from growing. It is used to treat multiple myeloma and mantle-cell lymphoma. This medicine may be used for other purposes; ask your health care provider or pharmacist if you have questions. COMMON BRAND NAME(S): Velcade What should I tell my health care provider before I take this medicine? They need to know if you have any of these conditions:  diabetes  heart disease  irregular heartbeat  liver disease  on hemodialysis  low blood counts, like low white blood cells, platelets, or hemoglobin  peripheral neuropathy  taking medicine for blood pressure  an unusual or allergic reaction to bortezomib, mannitol, boron, other medicines, foods, dyes, or preservatives  pregnant or trying to get pregnant  breast-feeding How should I use this medicine? This medicine is for injection into a vein or for injection under the skin. It is given by a health care professional in a hospital or clinic setting. Talk to your pediatrician regarding the use of this medicine in children. Special care may be needed. Overdosage: If you think you have taken too much of this medicine contact a poison control center or emergency room at once. NOTE: This medicine is only for you. Do not share this medicine with others. What if I miss a dose? It is important not to miss your dose. Call your doctor or health care professional if you are unable to keep an appointment. What may interact with this medicine? This medicine may interact with the following medications:  ketoconazole  rifampin  ritonavir  St. John's Wort This list may not describe all possible interactions. Give your health care provider a list of all the medicines, herbs, non-prescription drugs, or dietary supplements you use. Also tell them if you smoke, drink alcohol, or use illegal drugs. Some  items may interact with your medicine. What should I watch for while using this medicine? You may get drowsy or dizzy. Do not drive, use machinery, or do anything that needs mental alertness until you know how this medicine affects you. Do not stand or sit up quickly, especially if you are an older patient. This reduces the risk of dizzy or fainting spells. In some cases, you may be given additional medicines to help with side effects. Follow all directions for their use. Call your doctor or health care professional for advice if you get a fever, chills or sore throat, or other symptoms of a cold or flu. Do not treat yourself. This drug decreases your body's ability to fight infections. Try to avoid being around people who are sick. This medicine may increase your risk to bruise or bleed. Call your doctor or health care professional if you notice any unusual bleeding. You may need blood work done while you are taking this medicine. In some patients, this medicine may cause a serious brain infection that may cause death. If you have any problems seeing, thinking, speaking, walking, or standing, tell your doctor right away. If you cannot reach your doctor, urgently seek other source of medical care. Check with your doctor or health care professional if you get an attack of severe diarrhea, nausea and vomiting, or if you sweat a lot. The loss of too much body fluid can make it dangerous for you to take this medicine. Do not become pregnant while taking this medicine or for at least 7 months after stopping it. Women should inform their doctor   if they wish to become pregnant or think they might be pregnant. Men should not father a child while taking this medicine and for at least 4 months after stopping it. There is a potential for serious side effects to an unborn child. Talk to your health care professional or pharmacist for more information. Do not breast-feed an infant while taking this medicine or for 2  months after stopping it. This medicine may interfere with the ability to have a child. You should talk with your doctor or health care professional if you are concerned about your fertility. What side effects may I notice from receiving this medicine? Side effects that you should report to your doctor or health care professional as soon as possible:  allergic reactions like skin rash, itching or hives, swelling of the face, lips, or tongue  breathing problems  changes in hearing  changes in vision  fast, irregular heartbeat  feeling faint or lightheaded, falls  pain, tingling, numbness in the hands or feet  right upper belly pain  seizures  swelling of the ankles, feet, hands  unusual bleeding or bruising  unusually weak or tired  vomiting  yellowing of the eyes or skin Side effects that usually do not require medical attention (report to your doctor or health care professional if they continue or are bothersome):  changes in emotions or moods  constipation  diarrhea  loss of appetite  headache  irritation at site where injected  nausea This list may not describe all possible side effects. Call your doctor for medical advice about side effects. You may report side effects to FDA at 1-800-FDA-1088. Where should I keep my medicine? This drug is given in a hospital or clinic and will not be stored at home. NOTE: This sheet is a summary. It may not cover all possible information. If you have questions about this medicine, talk to your doctor, pharmacist, or health care provider.  2020 Elsevier/Gold Standard (2018-01-20 16:29:31)  

## 2020-08-04 NOTE — Progress Notes (Signed)
Hematology and Oncology Follow Up Visit  Kden Wagster [facility] [id number] 1934-09-05 86 y.o. 08/04/2020   Principle Diagnosis:  IgG Kappa myeloma -- high risk due to t(4:21)  Current Therapy: Velcade/Decadron --started01/15/2021, s/p cycle #10 Xgeva 120 mg IM q 3 months -- next dose on 09/2020   Interim History:  Mr. Ducat is here today for follow-up and treatment.  Overall, he has been doing pretty well.  He has been pretty active.  He has not had any problems with the atrial flutter.    There is no monoclonal spike in his blood.  However, the IgG level is 668 mg/dL.  His last Kappa light chain was 20 mg/dL.  This is slowly improving.    He has had no bony pain.  His appetite is good.  There is no problems with bowels or bladder.  He has had no fever.  He has had no cough or shortness of breath.  Overall, his performance status is ECOG 1.   Medications:  Allergies as of 08/04/2020   No Known Allergies     Medication List       Accurate as of August 04, 2020  1:37 PM. If you have any questions, ask your nurse or doctor.        aspirin EC 81 MG tablet Take 81 mg by mouth daily. Swallow whole.   budesonide-formoterol 160-4.5 MCG/ACT inhaler Commonly known as: SYMBICORT Inhale 2 puffs into the lungs 2 (two) times daily.   CINNAMON PO Take 1,000 mg by mouth daily.   cyanocobalamin 1000 MCG tablet Take 1,000 mcg by mouth daily.   lovastatin 40 MG tablet Commonly known as: MEVACOR Take 80 mg by mouth daily.   metoprolol succinate 50 MG 24 hr tablet Commonly known as: TOPROL-XL Take 25 mg by mouth in the morning and at bedtime.   nitroGLYCERIN 0.4 MG SL tablet Commonly known as: NITROSTAT Place 1 tablet (0.4 mg total) under the tongue every 5 (five) minutes as needed for chest pain.   olmesartan 20 MG tablet Commonly known as: BENICAR Take 20 mg by mouth daily.   traMADol 50 MG tablet Commonly known as: ULTRAM Take 50 mg by mouth in the morning and at  bedtime.       Allergies: No Known Allergies  Past Medical History, Surgical history, Social history, and Family History were reviewed and updated.  Review of Systems: Review of Systems  Constitutional: Negative.   HENT: Negative.   Eyes: Negative.   Respiratory: Negative.   Cardiovascular: Negative.   Gastrointestinal: Negative.   Genitourinary: Negative.   Musculoskeletal: Negative.   Skin: Negative.   Neurological: Negative.   Endo/Heme/Allergies: Negative.   Psychiatric/Behavioral: Negative.      Physical Exam:  weight is 144 lb (65.3 kg). His oral temperature is 98.2 F (36.8 C). His blood pressure is 136/68 and his pulse is 72. His respiration is 18 and oxygen saturation is 99%.   Wt Readings from Last 3 Encounters:  08/04/20 144 lb (65.3 kg)  07/07/20 141 lb (64 kg)  06/21/20 140 lb 12.8 oz (63.9 kg)    Physical Exam Vitals reviewed.  HENT:     Head: Normocephalic and atraumatic.  Eyes:     Pupils: Pupils are equal, round, and reactive to light.  Cardiovascular:     Rate and Rhythm: Normal rate and regular rhythm.     Heart sounds: Normal heart sounds.  Pulmonary:     Effort: Pulmonary effort is normal.     Breath  sounds: Normal breath sounds.  Abdominal:     General: Bowel sounds are normal.     Palpations: Abdomen is soft.  Musculoskeletal:        General: No tenderness or deformity. Normal range of motion.     Cervical back: Normal range of motion.  Lymphadenopathy:     Cervical: No cervical adenopathy.  Skin:    General: Skin is warm and dry.     Findings: No erythema or rash.  Neurological:     Mental Status: He is alert and oriented to person, place, and time.  Psychiatric:        Behavior: Behavior normal.        Thought Content: Thought content normal.        Judgment: Judgment normal.      Lab Results  Component Value Date   WBC 7.2 08/04/2020   HGB 13.0 08/04/2020   HCT 40.4 08/04/2020   MCV 100.2 (H) 08/04/2020   PLT 234  08/04/2020   No results found for: FERRITIN, IRON, TIBC, UIBC, IRONPCTSAT Lab Results  Component Value Date   RBC 4.03 (L) 08/04/2020   Lab Results  Component Value Date   KPAFRELGTCHN 208.7 (H) 07/07/2020   LAMBDASER 11.2 07/07/2020   KAPLAMBRATIO 18.63 (H) 07/07/2020   Lab Results  Component Value Date   IGGSERUM 713 07/07/2020   IGA 118 07/07/2020   IGMSERUM 24 07/07/2020   Lab Results  Component Value Date   TOTALPROTELP 5.9 (L) 07/07/2020   ALBUMINELP 3.3 07/07/2020   A1GS 0.2 07/07/2020   A2GS 0.8 07/07/2020   BETS 0.8 07/07/2020   GAMS 0.7 07/07/2020   MSPIKE Not Observed 07/07/2020   SPEI Comment 06/10/2020     Chemistry      Component Value Date/Time   NA 141 08/04/2020 1141   K 4.5 08/04/2020 1141   CL 105 08/04/2020 1141   CO2 31 08/04/2020 1141   BUN 19 08/04/2020 1141   CREATININE 1.52 (H) 08/04/2020 1141      Component Value Date/Time   CALCIUM 9.5 08/04/2020 1141   ALKPHOS 40 08/04/2020 1141   AST 15 08/04/2020 1141   ALT 13 08/04/2020 1141   BILITOT 0.8 08/04/2020 1141       Impression and Plan: Mr. Gambale is a very pleasant 86 yo caucasian gentleman with IgG kappa myeloma.   Again, he is on weekly Velcade for 3 weeks on and 1 week off.  I we will see how the Kappa light chain level looks.  He will still get the Velcade weekly.  However, his third week this cycle will be on Thanksgiving and we will just hold that dose.  We will plan to get him back in December.    [name], MD 11/11/20211:37 PM

## 2020-08-05 ENCOUNTER — Telehealth: Payer: Self-pay

## 2020-08-05 LAB — PROTEIN ELECTROPHORESIS, SERUM, WITH REFLEX
A/G Ratio: 1.3 (ref 0.7–1.7)
Albumin ELP: 3.3 g/dL (ref 2.9–4.4)
Alpha-1-Globulin: 0.3 g/dL (ref 0.0–0.4)
Alpha-2-Globulin: 0.7 g/dL (ref 0.4–1.0)
Beta Globulin: 0.9 g/dL (ref 0.7–1.3)
Gamma Globulin: 0.8 g/dL (ref 0.4–1.8)
Globulin, Total: 2.6 g/dL (ref 2.2–3.9)
Total Protein ELP: 5.9 g/dL — ABNORMAL LOW (ref 6.0–8.5)

## 2020-08-05 LAB — KAPPA/LAMBDA LIGHT CHAINS
Kappa free light chain: 187.5 mg/L — ABNORMAL HIGH (ref 3.3–19.4)
Kappa, lambda light chain ratio: 19.74 — ABNORMAL HIGH (ref 0.26–1.65)
Lambda free light chains: 9.5 mg/L (ref 5.7–26.3)

## 2020-08-05 LAB — IGG, IGA, IGM
IgA: 107 mg/dL (ref 61–437)
IgG (Immunoglobin G), Serum: 694 mg/dL (ref 603–1613)
IgM (Immunoglobulin M), Srm: 21 mg/dL (ref 15–143)

## 2020-08-05 NOTE — Telephone Encounter (Signed)
Called pt with appts per 08/04/20 LOS and did not get an answer, pt will be here 11/17 and will gain appts at that time... AOM

## 2020-08-09 ENCOUNTER — Other Ambulatory Visit: Payer: Self-pay | Admitting: *Deleted

## 2020-08-09 DIAGNOSIS — C9000 Multiple myeloma not having achieved remission: Secondary | ICD-10-CM

## 2020-08-10 ENCOUNTER — Other Ambulatory Visit: Payer: Self-pay

## 2020-08-10 ENCOUNTER — Inpatient Hospital Stay: Payer: Medicare Other

## 2020-08-10 VITALS — BP 133/66 | HR 67 | Temp 97.8°F | Resp 16

## 2020-08-10 DIAGNOSIS — C9000 Multiple myeloma not having achieved remission: Secondary | ICD-10-CM

## 2020-08-10 DIAGNOSIS — Z5112 Encounter for antineoplastic immunotherapy: Secondary | ICD-10-CM | POA: Diagnosis not present

## 2020-08-10 LAB — CBC WITH DIFFERENTIAL (CANCER CENTER ONLY)
Abs Immature Granulocytes: 0.08 10*3/uL — ABNORMAL HIGH (ref 0.00–0.07)
Basophils Absolute: 0 10*3/uL (ref 0.0–0.1)
Basophils Relative: 1 %
Eosinophils Absolute: 0.3 10*3/uL (ref 0.0–0.5)
Eosinophils Relative: 4 %
HCT: 40.7 % (ref 39.0–52.0)
Hemoglobin: 13 g/dL (ref 13.0–17.0)
Immature Granulocytes: 1 %
Lymphocytes Relative: 25 %
Lymphs Abs: 2 10*3/uL (ref 0.7–4.0)
MCH: 31.9 pg (ref 26.0–34.0)
MCHC: 31.9 g/dL (ref 30.0–36.0)
MCV: 99.8 fL (ref 80.0–100.0)
Monocytes Absolute: 1.1 10*3/uL — ABNORMAL HIGH (ref 0.1–1.0)
Monocytes Relative: 14 %
Neutro Abs: 4.5 10*3/uL (ref 1.7–7.7)
Neutrophils Relative %: 55 %
Platelet Count: 175 10*3/uL (ref 150–400)
RBC: 4.08 MIL/uL — ABNORMAL LOW (ref 4.22–5.81)
RDW: 13.1 % (ref 11.5–15.5)
WBC Count: 8 10*3/uL (ref 4.0–10.5)
nRBC: 0 % (ref 0.0–0.2)

## 2020-08-10 LAB — CMP (CANCER CENTER ONLY)
ALT: 12 U/L (ref 0–44)
AST: 12 U/L — ABNORMAL LOW (ref 15–41)
Albumin: 3.8 g/dL (ref 3.5–5.0)
Alkaline Phosphatase: 39 U/L (ref 38–126)
Anion gap: 5 (ref 5–15)
BUN: 16 mg/dL (ref 8–23)
CO2: 33 mmol/L — ABNORMAL HIGH (ref 22–32)
Calcium: 9.6 mg/dL (ref 8.9–10.3)
Chloride: 105 mmol/L (ref 98–111)
Creatinine: 1.53 mg/dL — ABNORMAL HIGH (ref 0.61–1.24)
GFR, Estimated: 45 mL/min — ABNORMAL LOW (ref >60)
Glucose, Bld: 73 mg/dL (ref 70–99)
Potassium: 4.8 mmol/L (ref 3.5–5.1)
Sodium: 143 mmol/L (ref 135–145)
Total Bilirubin: 0.8 mg/dL (ref 0.3–1.2)
Total Protein: 6.2 g/dL — ABNORMAL LOW (ref 6.5–8.1)

## 2020-08-10 MED ORDER — DEXAMETHASONE 4 MG PO TABS
20.0000 mg | ORAL_TABLET | ORAL | Status: DC
  Administered 2020-08-10: 20 mg via ORAL

## 2020-08-10 MED ORDER — BORTEZOMIB CHEMO SQ INJECTION 3.5 MG (2.5MG/ML)
1.3000 mg/m2 | Freq: Once | INTRAMUSCULAR | Status: AC
  Administered 2020-08-10: 2.25 mg via SUBCUTANEOUS
  Filled 2020-08-10: qty 0.9

## 2020-08-10 MED ORDER — DEXAMETHASONE 4 MG PO TABS
ORAL_TABLET | ORAL | Status: AC
  Filled 2020-08-10: qty 5

## 2020-08-10 MED ORDER — PROCHLORPERAZINE MALEATE 10 MG PO TABS
ORAL_TABLET | ORAL | Status: AC
  Filled 2020-08-10: qty 1

## 2020-08-10 MED ORDER — PROCHLORPERAZINE MALEATE 10 MG PO TABS
10.0000 mg | ORAL_TABLET | Freq: Once | ORAL | Status: AC
  Administered 2020-08-10: 10 mg via ORAL

## 2020-08-10 NOTE — Patient Instructions (Signed)
Bortezomib injection What is this medicine? BORTEZOMIB (bor TEZ oh mib) is a medicine that targets proteins in cancer cells and stops the cancer cells from growing. It is used to treat multiple myeloma and mantle-cell lymphoma. This medicine may be used for other purposes; ask your health care provider or pharmacist if you have questions. COMMON BRAND NAME(S): Velcade What should I tell my health care provider before I take this medicine? They need to know if you have any of these conditions:  diabetes  heart disease  irregular heartbeat  liver disease  on hemodialysis  low blood counts, like low white blood cells, platelets, or hemoglobin  peripheral neuropathy  taking medicine for blood pressure  an unusual or allergic reaction to bortezomib, mannitol, boron, other medicines, foods, dyes, or preservatives  pregnant or trying to get pregnant  breast-feeding How should I use this medicine? This medicine is for injection into a vein or for injection under the skin. It is given by a health care professional in a hospital or clinic setting. Talk to your pediatrician regarding the use of this medicine in children. Special care may be needed. Overdosage: If you think you have taken too much of this medicine contact a poison control center or emergency room at once. NOTE: This medicine is only for you. Do not share this medicine with others. What if I miss a dose? It is important not to miss your dose. Call your doctor or health care professional if you are unable to keep an appointment. What may interact with this medicine? This medicine may interact with the following medications:  ketoconazole  rifampin  ritonavir  St. John's Wort This list may not describe all possible interactions. Give your health care provider a list of all the medicines, herbs, non-prescription drugs, or dietary supplements you use. Also tell them if you smoke, drink alcohol, or use illegal drugs. Some  items may interact with your medicine. What should I watch for while using this medicine? You may get drowsy or dizzy. Do not drive, use machinery, or do anything that needs mental alertness until you know how this medicine affects you. Do not stand or sit up quickly, especially if you are an older patient. This reduces the risk of dizzy or fainting spells. In some cases, you may be given additional medicines to help with side effects. Follow all directions for their use. Call your doctor or health care professional for advice if you get a fever, chills or sore throat, or other symptoms of a cold or flu. Do not treat yourself. This drug decreases your body's ability to fight infections. Try to avoid being around people who are sick. This medicine may increase your risk to bruise or bleed. Call your doctor or health care professional if you notice any unusual bleeding. You may need blood work done while you are taking this medicine. In some patients, this medicine may cause a serious brain infection that may cause death. If you have any problems seeing, thinking, speaking, walking, or standing, tell your doctor right away. If you cannot reach your doctor, urgently seek other source of medical care. Check with your doctor or health care professional if you get an attack of severe diarrhea, nausea and vomiting, or if you sweat a lot. The loss of too much body fluid can make it dangerous for you to take this medicine. Do not become pregnant while taking this medicine or for at least 7 months after stopping it. Women should inform their doctor   if they wish to become pregnant or think they might be pregnant. Men should not father a child while taking this medicine and for at least 4 months after stopping it. There is a potential for serious side effects to an unborn child. Talk to your health care professional or pharmacist for more information. Do not breast-feed an infant while taking this medicine or for 2  months after stopping it. This medicine may interfere with the ability to have a child. You should talk with your doctor or health care professional if you are concerned about your fertility. What side effects may I notice from receiving this medicine? Side effects that you should report to your doctor or health care professional as soon as possible:  allergic reactions like skin rash, itching or hives, swelling of the face, lips, or tongue  breathing problems  changes in hearing  changes in vision  fast, irregular heartbeat  feeling faint or lightheaded, falls  pain, tingling, numbness in the hands or feet  right upper belly pain  seizures  swelling of the ankles, feet, hands  unusual bleeding or bruising  unusually weak or tired  vomiting  yellowing of the eyes or skin Side effects that usually do not require medical attention (report to your doctor or health care professional if they continue or are bothersome):  changes in emotions or moods  constipation  diarrhea  loss of appetite  headache  irritation at site where injected  nausea This list may not describe all possible side effects. Call your doctor for medical advice about side effects. You may report side effects to FDA at 1-800-FDA-1088. Where should I keep my medicine? This drug is given in a hospital or clinic and will not be stored at home. NOTE: This sheet is a summary. It may not cover all possible information. If you have questions about this medicine, talk to your doctor, pharmacist, or health care provider.  2020 Elsevier/Gold Standard (2018-01-20 16:29:31)  

## 2020-08-10 NOTE — Progress Notes (Signed)
Pt discharged in no apparent distress. Pt left ambulatory without assistance. Pt aware of discharge instructions and verbalized understanding and had no further questions.  

## 2020-08-10 NOTE — Progress Notes (Signed)
Ok to treat with creatinine of 1.53 per Dr Marin Olp. dph

## 2020-08-17 ENCOUNTER — Inpatient Hospital Stay: Payer: Medicare Other

## 2020-08-17 ENCOUNTER — Telehealth: Payer: Self-pay

## 2020-08-17 NOTE — Telephone Encounter (Signed)
Pt came in as he had an old schedule and came in for a lab today that he did not need/camcelled.  A new schedule was printed for the pt today////// AOM

## 2020-09-01 ENCOUNTER — Inpatient Hospital Stay: Payer: Medicare Other

## 2020-09-01 ENCOUNTER — Inpatient Hospital Stay (HOSPITAL_BASED_OUTPATIENT_CLINIC_OR_DEPARTMENT_OTHER): Payer: Medicare Other | Admitting: Hematology & Oncology

## 2020-09-01 ENCOUNTER — Inpatient Hospital Stay: Payer: Medicare Other | Attending: Family

## 2020-09-01 ENCOUNTER — Encounter: Payer: Self-pay | Admitting: Hematology & Oncology

## 2020-09-01 ENCOUNTER — Telehealth: Payer: Self-pay | Admitting: Hematology & Oncology

## 2020-09-01 ENCOUNTER — Other Ambulatory Visit: Payer: Self-pay

## 2020-09-01 VITALS — BP 131/59 | HR 75 | Temp 98.2°F | Resp 16 | Wt 145.0 lb

## 2020-09-01 DIAGNOSIS — C9000 Multiple myeloma not having achieved remission: Secondary | ICD-10-CM

## 2020-09-01 DIAGNOSIS — I25119 Atherosclerotic heart disease of native coronary artery with unspecified angina pectoris: Secondary | ICD-10-CM | POA: Diagnosis not present

## 2020-09-01 DIAGNOSIS — Z5111 Encounter for antineoplastic chemotherapy: Secondary | ICD-10-CM | POA: Diagnosis not present

## 2020-09-01 DIAGNOSIS — I4892 Unspecified atrial flutter: Secondary | ICD-10-CM | POA: Insufficient documentation

## 2020-09-01 DIAGNOSIS — Z7982 Long term (current) use of aspirin: Secondary | ICD-10-CM | POA: Diagnosis not present

## 2020-09-01 DIAGNOSIS — Z79899 Other long term (current) drug therapy: Secondary | ICD-10-CM | POA: Insufficient documentation

## 2020-09-01 LAB — CBC WITH DIFFERENTIAL (CANCER CENTER ONLY)
Abs Immature Granulocytes: 0.02 10*3/uL (ref 0.00–0.07)
Basophils Absolute: 0.1 10*3/uL (ref 0.0–0.1)
Basophils Relative: 1 %
Eosinophils Absolute: 0.3 10*3/uL (ref 0.0–0.5)
Eosinophils Relative: 4 %
HCT: 40.2 % (ref 39.0–52.0)
Hemoglobin: 13 g/dL (ref 13.0–17.0)
Immature Granulocytes: 0 %
Lymphocytes Relative: 26 %
Lymphs Abs: 1.9 10*3/uL (ref 0.7–4.0)
MCH: 32.8 pg (ref 26.0–34.0)
MCHC: 32.3 g/dL (ref 30.0–36.0)
MCV: 101.5 fL — ABNORMAL HIGH (ref 80.0–100.0)
Monocytes Absolute: 0.8 10*3/uL (ref 0.1–1.0)
Monocytes Relative: 11 %
Neutro Abs: 4.3 10*3/uL (ref 1.7–7.7)
Neutrophils Relative %: 58 %
Platelet Count: 208 10*3/uL (ref 150–400)
RBC: 3.96 MIL/uL — ABNORMAL LOW (ref 4.22–5.81)
RDW: 13.1 % (ref 11.5–15.5)
WBC Count: 7.4 10*3/uL (ref 4.0–10.5)
nRBC: 0 % (ref 0.0–0.2)

## 2020-09-01 LAB — CMP (CANCER CENTER ONLY)
ALT: 11 U/L (ref 0–44)
AST: 13 U/L — ABNORMAL LOW (ref 15–41)
Albumin: 3.9 g/dL (ref 3.5–5.0)
Alkaline Phosphatase: 39 U/L (ref 38–126)
Anion gap: 6 (ref 5–15)
BUN: 18 mg/dL (ref 8–23)
CO2: 31 mmol/L (ref 22–32)
Calcium: 9.3 mg/dL (ref 8.9–10.3)
Chloride: 105 mmol/L (ref 98–111)
Creatinine: 1.61 mg/dL — ABNORMAL HIGH (ref 0.61–1.24)
GFR, Estimated: 42 mL/min — ABNORMAL LOW (ref >60)
Glucose, Bld: 114 mg/dL — ABNORMAL HIGH (ref 70–99)
Potassium: 4.2 mmol/L (ref 3.5–5.1)
Sodium: 142 mmol/L (ref 135–145)
Total Bilirubin: 0.8 mg/dL (ref 0.3–1.2)
Total Protein: 6 g/dL — ABNORMAL LOW (ref 6.5–8.1)

## 2020-09-01 LAB — LACTATE DEHYDROGENASE: LDH: 161 U/L (ref 98–192)

## 2020-09-01 MED ORDER — PROCHLORPERAZINE MALEATE 10 MG PO TABS
ORAL_TABLET | ORAL | Status: AC
  Filled 2020-09-01: qty 1

## 2020-09-01 MED ORDER — BORTEZOMIB CHEMO SQ INJECTION 3.5 MG (2.5MG/ML)
1.3000 mg/m2 | Freq: Once | INTRAMUSCULAR | Status: AC
  Administered 2020-09-01: 2.25 mg via SUBCUTANEOUS
  Filled 2020-09-01: qty 0.9

## 2020-09-01 MED ORDER — PROCHLORPERAZINE MALEATE 10 MG PO TABS
10.0000 mg | ORAL_TABLET | Freq: Once | ORAL | Status: AC
  Administered 2020-09-01: 10 mg via ORAL

## 2020-09-01 MED ORDER — DEXAMETHASONE 4 MG PO TABS
ORAL_TABLET | ORAL | Status: AC
  Filled 2020-09-01: qty 5

## 2020-09-01 MED ORDER — DEXAMETHASONE 4 MG PO TABS
20.0000 mg | ORAL_TABLET | ORAL | Status: DC
  Administered 2020-09-01: 20 mg via ORAL

## 2020-09-01 NOTE — Progress Notes (Signed)
Hematology and Oncology Follow Up Visit  Ewan Grau [facility] [id number] [birth year] 86 y.o. 09/01/2020   Principle Diagnosis:  IgG Kappa myeloma -- high risk due to t(4:21)  Current Therapy: Velcade/Decadron --started01/15/2021, s/p cycle #11 Xgeva 120 mg IM q 3 months -- next dose on 09/2020   Interim History:  Mr. Kuhnle is here today for follow-up and treatment.  Overall, things seem to be doing pretty well with him.  He had a very nice Thanksgiving.  He has had no problems so far with nausea or vomiting.  He has had no issues with bowels or bladder.  He has had no diarrhea.  It sounds like the atrial flutter seems to be doing pretty well.  He has had no flareups or no palpitations.  His Kappa light chain, which is really is what we measure was down to 18.7 mg/dL.  He has had no rashes.  He has had no headache.  He has had no bleeding.  Overall, his performance status is ECOG 1.    Medications:  Allergies as of 09/01/2020   No Known Allergies     Medication List       Accurate as of September 01, 2020 12:15 PM. If you have any questions, ask your nurse or doctor.        aspirin EC 81 MG tablet Take 81 mg by mouth daily. Swallow whole.   budesonide-formoterol 160-4.5 MCG/ACT inhaler Commonly known as: SYMBICORT Inhale 2 puffs into the lungs 2 (two) times daily.   CINNAMON PO Take 1,000 mg by mouth daily.   cyanocobalamin 1000 MCG tablet Take 1,000 mcg by mouth daily.   lovastatin 40 MG tablet Commonly known as: MEVACOR Take 80 mg by mouth daily.   MELATONIN ER PO Take by mouth as needed.   metoprolol succinate 50 MG 24 hr tablet Commonly known as: TOPROL-XL Take 25 mg by mouth in the morning and at bedtime.   nitroGLYCERIN 0.4 MG SL tablet Commonly known as: NITROSTAT Place 1 tablet (0.4 mg total) under the tongue every 5 (five) minutes as needed for chest pain.   olmesartan 20 MG tablet Commonly known as: BENICAR Take 20 mg by mouth daily.    traMADol 50 MG tablet Commonly known as: ULTRAM Take 50 mg by mouth in the morning and at bedtime.       Allergies: No Known Allergies  Past Medical History, Surgical history, Social history, and Family History were reviewed and updated.  Review of Systems: Review of Systems  Constitutional: Negative.   HENT: Negative.   Eyes: Negative.   Respiratory: Negative.   Cardiovascular: Negative.   Gastrointestinal: Negative.   Genitourinary: Negative.   Musculoskeletal: Negative.   Skin: Negative.   Neurological: Negative.   Endo/Heme/Allergies: Negative.   Psychiatric/Behavioral: Negative.      Physical Exam:  weight is 145 lb (65.8 kg). His oral temperature is 98.2 F (36.8 C). His blood pressure is 131/59 (abnormal) and his pulse is 75. His respiration is 16 and oxygen saturation is 98%.   Wt Readings from Last 3 Encounters:  09/01/20 145 lb (65.8 kg)  08/04/20 144 lb (65.3 kg)  07/07/20 141 lb (64 kg)    Physical Exam Vitals reviewed.  HENT:     Head: Normocephalic and atraumatic.  Eyes:     Pupils: Pupils are equal, round, and reactive to light.  Cardiovascular:     Rate and Rhythm: Normal rate and regular rhythm.     Heart sounds: Normal heart sounds.  Pulmonary:     Effort: Pulmonary effort is normal.     Breath sounds: Normal breath sounds.  Abdominal:     General: Bowel sounds are normal.     Palpations: Abdomen is soft.  Musculoskeletal:        General: No tenderness or deformity. Normal range of motion.     Cervical back: Normal range of motion.  Lymphadenopathy:     Cervical: No cervical adenopathy.  Skin:    General: Skin is warm and dry.     Findings: No erythema or rash.  Neurological:     Mental Status: He is alert and oriented to person, place, and time.  Psychiatric:        Behavior: Behavior normal.        Thought Content: Thought content normal.        Judgment: Judgment normal.      Lab Results  Component Value Date   WBC 7.4  09/01/2020   HGB 13.0 09/01/2020   HCT 40.2 09/01/2020   MCV 101.5 (H) 09/01/2020   PLT 208 09/01/2020   No results found for: FERRITIN, IRON, TIBC, UIBC, IRONPCTSAT Lab Results  Component Value Date   RBC 3.96 (L) 09/01/2020   Lab Results  Component Value Date   KPAFRELGTCHN 187.5 (H) 08/04/2020   LAMBDASER 9.5 08/04/2020   KAPLAMBRATIO 19.74 (H) 08/04/2020   Lab Results  Component Value Date   IGGSERUM 694 08/04/2020   IGA 107 08/04/2020   IGMSERUM 21 08/04/2020   Lab Results  Component Value Date   TOTALPROTELP 5.9 (L) 08/04/2020   ALBUMINELP 3.3 08/04/2020   A1GS 0.3 08/04/2020   A2GS 0.7 08/04/2020   BETS 0.9 08/04/2020   GAMS 0.8 08/04/2020   MSPIKE Not Observed 08/04/2020   SPEI Comment 06/10/2020     Chemistry      Component Value Date/Time   NA 142 09/01/2020 1005   K 4.2 09/01/2020 1005   CL 105 09/01/2020 1005   CO2 31 09/01/2020 1005   BUN 18 09/01/2020 1005   CREATININE 1.61 (H) 09/01/2020 1005      Component Value Date/Time   CALCIUM 9.3 09/01/2020 1005   ALKPHOS 39 09/01/2020 1005   AST 13 (L) 09/01/2020 1005   ALT 11 09/01/2020 1005   BILITOT 0.8 09/01/2020 1005       Impression and Plan: Mr. Minnie is a very pleasant 86 yo caucasian gentleman with IgG kappa myeloma.   Again, he is on weekly Velcade for 3 weeks on and 1 week off.  I we will see how the Kappa light chain level looks.  He will still get the Velcade weekly.    We will plan to get him back in January.  We will finish up this cycle right before Christmas.      [name], MD 12/9/202112:15 PM

## 2020-09-01 NOTE — Telephone Encounter (Signed)
Appointments scheduled calendar printed per 12/9 los 

## 2020-09-01 NOTE — Patient Instructions (Signed)
[facility] Discharge Instructions for Patients Receiving Chemotherapy  Today you received the following chemotherapy agents Velcade  To help prevent nausea and vomiting after your treatment, we encourage you to take your nausea medication as prescribed by MD.   If you develop nausea and vomiting that is not controlled by your nausea medication, call the clinic.   BELOW ARE SYMPTOMS THAT SHOULD BE REPORTED IMMEDIATELY:  *FEVER GREATER THAN 100.5 F  *CHILLS WITH OR WITHOUT FEVER  NAUSEA AND VOMITING THAT IS NOT CONTROLLED WITH YOUR NAUSEA MEDICATION  *UNUSUAL SHORTNESS OF BREATH  *UNUSUAL BRUISING OR BLEEDING  TENDERNESS IN MOUTH AND THROAT WITH OR WITHOUT PRESENCE OF ULCERS  *URINARY PROBLEMS  *BOWEL PROBLEMS  UNUSUAL RASH Items with * indicate a potential emergency and should be followed up as soon as possible.  Feel free to call the clinic should you have any questions or concerns. The clinic phone number is [phone].  Please show the [facility] at check-in to the Emergency Department and triage nurse.

## 2020-09-02 LAB — PROTEIN ELECTROPHORESIS, SERUM, WITH REFLEX
A/G Ratio: 1.5 (ref 0.7–1.7)
Albumin ELP: 3.5 g/dL (ref 2.9–4.4)
Alpha-1-Globulin: 0.2 g/dL (ref 0.0–0.4)
Alpha-2-Globulin: 0.7 g/dL (ref 0.4–1.0)
Beta Globulin: 0.8 g/dL (ref 0.7–1.3)
Gamma Globulin: 0.7 g/dL (ref 0.4–1.8)
Globulin, Total: 2.4 g/dL (ref 2.2–3.9)
Total Protein ELP: 5.9 g/dL — ABNORMAL LOW (ref 6.0–8.5)

## 2020-09-02 LAB — IGG, IGA, IGM
IgA: 118 mg/dL (ref 61–437)
IgG (Immunoglobin G), Serum: 706 mg/dL (ref 603–1613)
IgM (Immunoglobulin M), Srm: 17 mg/dL (ref 15–143)

## 2020-09-02 LAB — KAPPA/LAMBDA LIGHT CHAINS
Kappa free light chain: 191.7 mg/L — ABNORMAL HIGH (ref 3.3–19.4)
Kappa, lambda light chain ratio: 19.97 — ABNORMAL HIGH (ref 0.26–1.65)
Lambda free light chains: 9.6 mg/L (ref 5.7–26.3)

## 2020-09-06 ENCOUNTER — Other Ambulatory Visit: Payer: Self-pay

## 2020-09-06 DIAGNOSIS — C9000 Multiple myeloma not having achieved remission: Secondary | ICD-10-CM

## 2020-09-07 ENCOUNTER — Other Ambulatory Visit: Payer: Self-pay

## 2020-09-07 ENCOUNTER — Inpatient Hospital Stay: Payer: Medicare Other

## 2020-09-07 VITALS — BP 150/66 | HR 75 | Temp 98.2°F | Resp 17

## 2020-09-07 DIAGNOSIS — C9000 Multiple myeloma not having achieved remission: Secondary | ICD-10-CM

## 2020-09-07 DIAGNOSIS — Z5111 Encounter for antineoplastic chemotherapy: Secondary | ICD-10-CM | POA: Diagnosis not present

## 2020-09-07 LAB — CMP (CANCER CENTER ONLY)
ALT: 10 U/L (ref 0–44)
AST: 13 U/L — ABNORMAL LOW (ref 15–41)
Albumin: 3.7 g/dL (ref 3.5–5.0)
Alkaline Phosphatase: 39 U/L (ref 38–126)
Anion gap: 4 — ABNORMAL LOW (ref 5–15)
BUN: 18 mg/dL (ref 8–23)
CO2: 31 mmol/L (ref 22–32)
Calcium: 9.3 mg/dL (ref 8.9–10.3)
Chloride: 105 mmol/L (ref 98–111)
Creatinine: 1.49 mg/dL — ABNORMAL HIGH (ref 0.61–1.24)
GFR, Estimated: 46 mL/min — ABNORMAL LOW (ref >60)
Glucose, Bld: 99 mg/dL (ref 70–99)
Potassium: 4.5 mmol/L (ref 3.5–5.1)
Sodium: 140 mmol/L (ref 135–145)
Total Bilirubin: 0.7 mg/dL (ref 0.3–1.2)
Total Protein: 5.7 g/dL — ABNORMAL LOW (ref 6.5–8.1)

## 2020-09-07 LAB — CBC WITH DIFFERENTIAL (CANCER CENTER ONLY)
Abs Immature Granulocytes: 0.04 10*3/uL (ref 0.00–0.07)
Basophils Absolute: 0 10*3/uL (ref 0.0–0.1)
Basophils Relative: 1 %
Eosinophils Absolute: 0.3 10*3/uL (ref 0.0–0.5)
Eosinophils Relative: 3 %
HCT: 39.8 % (ref 39.0–52.0)
Hemoglobin: 12.8 g/dL — ABNORMAL LOW (ref 13.0–17.0)
Immature Granulocytes: 1 %
Lymphocytes Relative: 25 %
Lymphs Abs: 1.9 10*3/uL (ref 0.7–4.0)
MCH: 32.4 pg (ref 26.0–34.0)
MCHC: 32.2 g/dL (ref 30.0–36.0)
MCV: 100.8 fL — ABNORMAL HIGH (ref 80.0–100.0)
Monocytes Absolute: 1 10*3/uL (ref 0.1–1.0)
Monocytes Relative: 13 %
Neutro Abs: 4.5 10*3/uL (ref 1.7–7.7)
Neutrophils Relative %: 57 %
Platelet Count: 174 10*3/uL (ref 150–400)
RBC: 3.95 MIL/uL — ABNORMAL LOW (ref 4.22–5.81)
RDW: 12.9 % (ref 11.5–15.5)
WBC Count: 7.7 10*3/uL (ref 4.0–10.5)
nRBC: 0 % (ref 0.0–0.2)

## 2020-09-07 MED ORDER — PROCHLORPERAZINE MALEATE 10 MG PO TABS
ORAL_TABLET | ORAL | Status: AC
  Filled 2020-09-07: qty 1

## 2020-09-07 MED ORDER — BORTEZOMIB CHEMO SQ INJECTION 3.5 MG (2.5MG/ML)
1.3000 mg/m2 | Freq: Once | INTRAMUSCULAR | Status: AC
  Administered 2020-09-07: 2.25 mg via SUBCUTANEOUS
  Filled 2020-09-07: qty 0.9

## 2020-09-07 MED ORDER — DEXAMETHASONE 4 MG PO TABS
20.0000 mg | ORAL_TABLET | ORAL | Status: DC
  Administered 2020-09-07: 20 mg via ORAL

## 2020-09-07 MED ORDER — PROCHLORPERAZINE MALEATE 10 MG PO TABS
10.0000 mg | ORAL_TABLET | Freq: Once | ORAL | Status: AC
  Administered 2020-09-07: 10 mg via ORAL

## 2020-09-07 MED ORDER — DEXAMETHASONE 4 MG PO TABS
ORAL_TABLET | ORAL | Status: AC
  Filled 2020-09-07: qty 5

## 2020-09-07 NOTE — Patient Instructions (Signed)
[facility] Discharge Instructions for Patients Receiving Chemotherapy  Today you received the following chemotherapy agents Velcade  To help prevent nausea and vomiting after your treatment, we encourage you to take your nausea medication as prescribed by MD.   If you develop nausea and vomiting that is not controlled by your nausea medication, call the clinic.   BELOW ARE SYMPTOMS THAT SHOULD BE REPORTED IMMEDIATELY:  *FEVER GREATER THAN 100.5 F  *CHILLS WITH OR WITHOUT FEVER  NAUSEA AND VOMITING THAT IS NOT CONTROLLED WITH YOUR NAUSEA MEDICATION  *UNUSUAL SHORTNESS OF BREATH  *UNUSUAL BRUISING OR BLEEDING  TENDERNESS IN MOUTH AND THROAT WITH OR WITHOUT PRESENCE OF ULCERS  *URINARY PROBLEMS  *BOWEL PROBLEMS  UNUSUAL RASH Items with * indicate a potential emergency and should be followed up as soon as possible.  Feel free to call the clinic should you have any questions or concerns. The clinic phone number is [phone].  Please show the [facility] at check-in to the Emergency Department and triage nurse.

## 2020-09-14 ENCOUNTER — Other Ambulatory Visit: Payer: Self-pay | Admitting: *Deleted

## 2020-09-14 DIAGNOSIS — C9000 Multiple myeloma not having achieved remission: Secondary | ICD-10-CM

## 2020-09-15 ENCOUNTER — Other Ambulatory Visit: Payer: Self-pay

## 2020-09-15 ENCOUNTER — Inpatient Hospital Stay: Payer: Medicare Other

## 2020-09-15 ENCOUNTER — Other Ambulatory Visit: Payer: Self-pay | Admitting: Hematology & Oncology

## 2020-09-15 VITALS — BP 127/71 | HR 65 | Temp 98.2°F | Resp 18

## 2020-09-15 DIAGNOSIS — C9000 Multiple myeloma not having achieved remission: Secondary | ICD-10-CM

## 2020-09-15 DIAGNOSIS — Z5111 Encounter for antineoplastic chemotherapy: Secondary | ICD-10-CM | POA: Diagnosis not present

## 2020-09-15 LAB — CBC WITH DIFFERENTIAL (CANCER CENTER ONLY)
Abs Immature Granulocytes: 0.07 10*3/uL (ref 0.00–0.07)
Basophils Absolute: 0 10*3/uL (ref 0.0–0.1)
Basophils Relative: 0 %
Eosinophils Absolute: 0.3 10*3/uL (ref 0.0–0.5)
Eosinophils Relative: 3 %
HCT: 41.7 % (ref 39.0–52.0)
Hemoglobin: 13.5 g/dL (ref 13.0–17.0)
Immature Granulocytes: 1 %
Lymphocytes Relative: 18 %
Lymphs Abs: 1.8 10*3/uL (ref 0.7–4.0)
MCH: 32.4 pg (ref 26.0–34.0)
MCHC: 32.4 g/dL (ref 30.0–36.0)
MCV: 100 fL (ref 80.0–100.0)
Monocytes Absolute: 1 10*3/uL (ref 0.1–1.0)
Monocytes Relative: 9 %
Neutro Abs: 6.9 10*3/uL (ref 1.7–7.7)
Neutrophils Relative %: 69 %
Platelet Count: 152 10*3/uL (ref 150–400)
RBC: 4.17 MIL/uL — ABNORMAL LOW (ref 4.22–5.81)
RDW: 12.7 % (ref 11.5–15.5)
WBC Count: 10.1 10*3/uL (ref 4.0–10.5)
nRBC: 0 % (ref 0.0–0.2)

## 2020-09-15 LAB — COMPREHENSIVE METABOLIC PANEL
ALT: 12 U/L (ref 0–44)
AST: 13 U/L — ABNORMAL LOW (ref 15–41)
Albumin: 3.7 g/dL (ref 3.5–5.0)
Alkaline Phosphatase: 35 U/L — ABNORMAL LOW (ref 38–126)
Anion gap: 3 — ABNORMAL LOW (ref 5–15)
BUN: 16 mg/dL (ref 8–23)
CO2: 32 mmol/L (ref 22–32)
Calcium: 9.3 mg/dL (ref 8.9–10.3)
Chloride: 104 mmol/L (ref 98–111)
Creatinine, Ser: 1.37 mg/dL — ABNORMAL HIGH (ref 0.61–1.24)
GFR, Estimated: 51 mL/min — ABNORMAL LOW (ref >60)
Glucose, Bld: 108 mg/dL — ABNORMAL HIGH (ref 70–99)
Potassium: 3.8 mmol/L (ref 3.5–5.1)
Sodium: 139 mmol/L (ref 135–145)
Total Bilirubin: 0.7 mg/dL (ref 0.3–1.2)
Total Protein: 5.9 g/dL — ABNORMAL LOW (ref 6.5–8.1)

## 2020-09-15 MED ORDER — DEXAMETHASONE 4 MG PO TABS
20.0000 mg | ORAL_TABLET | ORAL | Status: DC
  Administered 2020-09-15: 20 mg via ORAL

## 2020-09-15 MED ORDER — BORTEZOMIB CHEMO SQ INJECTION 3.5 MG (2.5MG/ML)
1.3000 mg/m2 | Freq: Once | INTRAMUSCULAR | Status: AC
  Administered 2020-09-15: 2.25 mg via SUBCUTANEOUS
  Filled 2020-09-15: qty 0.9

## 2020-09-15 MED ORDER — PROCHLORPERAZINE MALEATE 10 MG PO TABS
10.0000 mg | ORAL_TABLET | Freq: Once | ORAL | Status: AC
  Administered 2020-09-15: 10 mg via ORAL

## 2020-09-15 MED ORDER — DEXAMETHASONE 4 MG PO TABS
ORAL_TABLET | ORAL | Status: AC
  Filled 2020-09-15: qty 5

## 2020-09-15 MED ORDER — PROCHLORPERAZINE MALEATE 10 MG PO TABS
ORAL_TABLET | ORAL | Status: AC
  Filled 2020-09-15: qty 1

## 2020-10-06 ENCOUNTER — Inpatient Hospital Stay (HOSPITAL_BASED_OUTPATIENT_CLINIC_OR_DEPARTMENT_OTHER): Payer: Medicare Other | Admitting: Family

## 2020-10-06 ENCOUNTER — Encounter: Payer: Self-pay | Admitting: Family

## 2020-10-06 ENCOUNTER — Other Ambulatory Visit: Payer: Self-pay

## 2020-10-06 ENCOUNTER — Telehealth: Payer: Self-pay

## 2020-10-06 ENCOUNTER — Inpatient Hospital Stay: Payer: Medicare Other | Attending: Family

## 2020-10-06 ENCOUNTER — Inpatient Hospital Stay: Payer: Medicare Other

## 2020-10-06 VITALS — BP 128/60 | HR 70 | Temp 98.6°F | Resp 16 | Ht 71.0 in | Wt 145.4 lb

## 2020-10-06 DIAGNOSIS — Z5111 Encounter for antineoplastic chemotherapy: Secondary | ICD-10-CM | POA: Diagnosis not present

## 2020-10-06 DIAGNOSIS — Z79899 Other long term (current) drug therapy: Secondary | ICD-10-CM | POA: Diagnosis not present

## 2020-10-06 DIAGNOSIS — R5383 Other fatigue: Secondary | ICD-10-CM | POA: Insufficient documentation

## 2020-10-06 DIAGNOSIS — C9000 Multiple myeloma not having achieved remission: Secondary | ICD-10-CM

## 2020-10-06 DIAGNOSIS — R202 Paresthesia of skin: Secondary | ICD-10-CM | POA: Diagnosis not present

## 2020-10-06 DIAGNOSIS — R42 Dizziness and giddiness: Secondary | ICD-10-CM | POA: Diagnosis not present

## 2020-10-06 DIAGNOSIS — Z7982 Long term (current) use of aspirin: Secondary | ICD-10-CM | POA: Diagnosis not present

## 2020-10-06 LAB — CBC WITH DIFFERENTIAL (CANCER CENTER ONLY)
Abs Immature Granulocytes: 0.02 10*3/uL (ref 0.00–0.07)
Basophils Absolute: 0.1 10*3/uL (ref 0.0–0.1)
Basophils Relative: 1 %
Eosinophils Absolute: 0.3 10*3/uL (ref 0.0–0.5)
Eosinophils Relative: 4 %
HCT: 41.1 % (ref 39.0–52.0)
Hemoglobin: 13.3 g/dL (ref 13.0–17.0)
Immature Granulocytes: 0 %
Lymphocytes Relative: 27 %
Lymphs Abs: 1.9 10*3/uL (ref 0.7–4.0)
MCH: 32.1 pg (ref 26.0–34.0)
MCHC: 32.4 g/dL (ref 30.0–36.0)
MCV: 99.3 fL (ref 80.0–100.0)
Monocytes Absolute: 0.8 10*3/uL (ref 0.1–1.0)
Monocytes Relative: 11 %
Neutro Abs: 4 10*3/uL (ref 1.7–7.7)
Neutrophils Relative %: 57 %
Platelet Count: 214 10*3/uL (ref 150–400)
RBC: 4.14 MIL/uL — ABNORMAL LOW (ref 4.22–5.81)
RDW: 12.3 % (ref 11.5–15.5)
WBC Count: 7.1 10*3/uL (ref 4.0–10.5)
nRBC: 0 % (ref 0.0–0.2)

## 2020-10-06 LAB — CMP (CANCER CENTER ONLY)
ALT: 13 U/L (ref 0–44)
AST: 15 U/L (ref 15–41)
Albumin: 4 g/dL (ref 3.5–5.0)
Alkaline Phosphatase: 42 U/L (ref 38–126)
Anion gap: 3 — ABNORMAL LOW (ref 5–15)
BUN: 18 mg/dL (ref 8–23)
CO2: 33 mmol/L — ABNORMAL HIGH (ref 22–32)
Calcium: 9.4 mg/dL (ref 8.9–10.3)
Chloride: 104 mmol/L (ref 98–111)
Creatinine: 1.51 mg/dL — ABNORMAL HIGH (ref 0.61–1.24)
GFR, Estimated: 45 mL/min — ABNORMAL LOW (ref >60)
Glucose, Bld: 119 mg/dL — ABNORMAL HIGH (ref 70–99)
Potassium: 4.3 mmol/L (ref 3.5–5.1)
Sodium: 140 mmol/L (ref 135–145)
Total Bilirubin: 0.8 mg/dL (ref 0.3–1.2)
Total Protein: 6 g/dL — ABNORMAL LOW (ref 6.5–8.1)

## 2020-10-06 LAB — LACTATE DEHYDROGENASE: LDH: 154 U/L (ref 98–192)

## 2020-10-06 MED ORDER — PROCHLORPERAZINE MALEATE 10 MG PO TABS
ORAL_TABLET | ORAL | Status: AC
  Filled 2020-10-06: qty 1

## 2020-10-06 MED ORDER — DENOSUMAB 120 MG/1.7ML ~~LOC~~ SOLN
120.0000 mg | Freq: Once | SUBCUTANEOUS | Status: AC
  Administered 2020-10-06: 120 mg via SUBCUTANEOUS

## 2020-10-06 MED ORDER — DEXAMETHASONE 4 MG PO TABS
ORAL_TABLET | ORAL | Status: AC
  Filled 2020-10-06: qty 5

## 2020-10-06 MED ORDER — PROCHLORPERAZINE MALEATE 10 MG PO TABS
10.0000 mg | ORAL_TABLET | Freq: Once | ORAL | Status: AC
  Administered 2020-10-06: 10 mg via ORAL

## 2020-10-06 MED ORDER — BORTEZOMIB CHEMO SQ INJECTION 3.5 MG (2.5MG/ML)
1.3000 mg/m2 | Freq: Once | INTRAMUSCULAR | Status: AC
  Administered 2020-10-06: 2.25 mg via SUBCUTANEOUS
  Filled 2020-10-06: qty 0.9

## 2020-10-06 MED ORDER — DENOSUMAB 120 MG/1.7ML ~~LOC~~ SOLN
SUBCUTANEOUS | Status: AC
  Filled 2020-10-06: qty 1.7

## 2020-10-06 MED ORDER — DEXAMETHASONE 4 MG PO TABS
20.0000 mg | ORAL_TABLET | ORAL | Status: DC
  Administered 2020-10-06: 20 mg via ORAL

## 2020-10-06 NOTE — Progress Notes (Signed)
Hematology and Oncology Follow Up Visit  Shawn Bauer [id number] [birth year] 87 y.o. 10/06/2020   Principle Diagnosis:  IgG Kappa myeloma -- high risk due to t(4:21)  Current Therapy: Velcade/Decadron --started01/15/2021, s/p cycle#11 Xgeva 120 mg IM q 3 months -- next dose on 09/2020   Interim History:  Shawn Bauer is here today for follow-up and treatment. He notes some occasional fatigue but overall is doing quite well.  Kappa light chains at last visit were 19.1 mg/dL, no m-spike detected and IgG level 706 mg/dL.  No fever, chills, n/v, cough, rash, chest pain, palpitations, abdominal pain or changes in bowel or bladder habits.  He has had SOB with exertion since childhood due to asthma. This is unchanged.  He has occasional episodes of dizziness. The room will sometimes spin when he turns his head or if he stands up too quickly. He is careful when getting up. He states that he has had this for years and assumes it is likely due to vertigo.  No changes or loss of vision. No falls or syncope to report.  No episodes of bleeding. No abnormal bruising or petechiae.  No swelling or tenderness in his extremities. He will occasionally have positional tingling in his left pinky finger.  He has a good appetite and feels that he is staying properly hydrated. His weight is stable at 145 lbs.   ECOG Performance Status: 1 - Symptomatic but completely ambulatory  Medications:  Allergies as of 10/06/2020   No Known Allergies     Medication List       Accurate as of October 06, 2020 10:29 AM. If you have any questions, ask your nurse or doctor.        aspirin EC 81 MG tablet Take 81 mg by mouth daily. Swallow whole.   budesonide-formoterol 160-4.5 MCG/ACT inhaler Commonly known as: SYMBICORT Inhale 2 puffs into the lungs 2 (two) times daily.   CINNAMON PO Take 1,000 mg by mouth daily.   cyanocobalamin 1000 MCG tablet Take 1,000 mcg by mouth daily.   lovastatin 40 MG  tablet Commonly known as: MEVACOR Take 80 mg by mouth daily.   MELATONIN ER PO Take by mouth as needed.   metoprolol succinate 50 MG 24 hr tablet Commonly known as: TOPROL-XL Take 25 mg by mouth in the morning and at bedtime.   nitroGLYCERIN 0.4 MG SL tablet Commonly known as: NITROSTAT Place 1 tablet (0.4 mg total) under the tongue every 5 (five) minutes as needed for chest pain.   olmesartan 20 MG tablet Commonly known as: BENICAR Take 20 mg by mouth daily.   traMADol 50 MG tablet Commonly known as: ULTRAM Take 50 mg by mouth in the morning and at bedtime.       Allergies: No Known Allergies  Past Medical History, Surgical history, Social history, and Family History were reviewed and updated.  Review of Systems: All other 10 point review of systems is negative.   Physical Exam:  height is 5\' 11"  (1.803 m) and weight is 145 lb 6.4 oz (66 kg). His oral temperature is 98.6 F (37 C). His blood pressure is 128/60 and his pulse is 70. His respiration is 16 and oxygen saturation is 99%.   Wt Readings from Last 3 Encounters:  10/06/20 145 lb 6.4 oz (66 kg)  09/01/20 145 lb (65.8 kg)  08/04/20 144 lb (65.3 kg)    Ocular: Sclerae unicteric, pupils equal, round and reactive to light Ear-nose-throat: Oropharynx clear, dentition fair Lymphatic: No  cervical or supraclavicular adenopathy Lungs no rales or rhonchi, good excursion bilaterally Heart regular rate and rhythm, no murmur appreciated Abd soft, nontender, positive bowel sounds MSK no focal spinal tenderness, no joint edema Neuro: non-focal, well-oriented, appropriate affect Breasts: Deferred   Lab Results  Component Value Date   WBC 7.1 10/06/2020   HGB 13.3 10/06/2020   HCT 41.1 10/06/2020   MCV 99.3 10/06/2020   PLT 214 10/06/2020   No results found for: FERRITIN, IRON, TIBC, UIBC, IRONPCTSAT Lab Results  Component Value Date   RBC 4.14 (L) 10/06/2020   Lab Results  Component Value Date   KPAFRELGTCHN  191.7 (H) 09/01/2020   LAMBDASER 9.6 09/01/2020   KAPLAMBRATIO 19.97 (H) 09/01/2020   Lab Results  Component Value Date   IGGSERUM 706 09/01/2020   IGA 118 09/01/2020   IGMSERUM 17 09/01/2020   Lab Results  Component Value Date   TOTALPROTELP 5.9 (L) 09/01/2020   ALBUMINELP 3.5 09/01/2020   A1GS 0.2 09/01/2020   A2GS 0.7 09/01/2020   BETS 0.8 09/01/2020   GAMS 0.7 09/01/2020   MSPIKE Not Observed 09/01/2020   SPEI Comment 06/10/2020     Chemistry      Component Value Date/Time   NA 140 10/06/2020 0942   K 4.3 10/06/2020 0942   CL 104 10/06/2020 0942   CO2 33 (H) 10/06/2020 0942   BUN 18 10/06/2020 0942   CREATININE 1.51 (H) 10/06/2020 0942      Component Value Date/Time   CALCIUM 9.4 10/06/2020 0942   ALKPHOS 42 10/06/2020 0942   AST 15 10/06/2020 0942   ALT 13 10/06/2020 0942   BILITOT 0.8 10/06/2020 0942       Impression and Plan: Shawn Bauer is a very pleasant 87 yo caucasian gentleman with IgG kappa myeloma.He is doing well and has has no complaints at this time.  He has tolerated Velcade nicely and his light chains have been stable.  Today's results are pending.  He will continue his same injection schedule of 3 weeks on 1 week off.  Follow-up in 1 month.  He was encouraged to contact our office with any questions or concerns. We can certainly see him sooner if needed.   Laverna Peace, NP 1/13/202210:29 AM

## 2020-10-06 NOTE — Telephone Encounter (Signed)
Called pt with appts per 10/06/20 los, someone answered, then hung up, appts mailed to pt    aom

## 2020-10-07 LAB — PROTEIN ELECTROPHORESIS, SERUM, WITH REFLEX
A/G Ratio: 1.5 (ref 0.7–1.7)
Albumin ELP: 3.6 g/dL (ref 2.9–4.4)
Alpha-1-Globulin: 0.2 g/dL (ref 0.0–0.4)
Alpha-2-Globulin: 0.7 g/dL (ref 0.4–1.0)
Beta Globulin: 0.8 g/dL (ref 0.7–1.3)
Gamma Globulin: 0.7 g/dL (ref 0.4–1.8)
Globulin, Total: 2.4 g/dL (ref 2.2–3.9)
Total Protein ELP: 6 g/dL (ref 6.0–8.5)

## 2020-10-07 LAB — KAPPA/LAMBDA LIGHT CHAINS
Kappa free light chain: 209.6 mg/L — ABNORMAL HIGH (ref 3.3–19.4)
Kappa, lambda light chain ratio: 20.15 — ABNORMAL HIGH (ref 0.26–1.65)
Lambda free light chains: 10.4 mg/L (ref 5.7–26.3)

## 2020-10-07 LAB — IGG, IGA, IGM
IgA: 120 mg/dL (ref 61–437)
IgG (Immunoglobin G), Serum: 704 mg/dL (ref 603–1613)
IgM (Immunoglobulin M), Srm: 28 mg/dL (ref 15–143)

## 2020-11-03 ENCOUNTER — Inpatient Hospital Stay: Payer: Medicare Other

## 2020-11-03 ENCOUNTER — Other Ambulatory Visit: Payer: Self-pay

## 2020-11-03 ENCOUNTER — Inpatient Hospital Stay (HOSPITAL_BASED_OUTPATIENT_CLINIC_OR_DEPARTMENT_OTHER): Payer: Medicare Other | Admitting: Family

## 2020-11-03 ENCOUNTER — Inpatient Hospital Stay: Payer: Medicare Other | Attending: Family

## 2020-11-03 ENCOUNTER — Encounter: Payer: Self-pay | Admitting: Family

## 2020-11-03 VITALS — BP 140/60 | HR 72 | Temp 98.3°F | Resp 20 | Wt 143.4 lb

## 2020-11-03 DIAGNOSIS — Z79899 Other long term (current) drug therapy: Secondary | ICD-10-CM | POA: Diagnosis not present

## 2020-11-03 DIAGNOSIS — R5383 Other fatigue: Secondary | ICD-10-CM | POA: Insufficient documentation

## 2020-11-03 DIAGNOSIS — C9000 Multiple myeloma not having achieved remission: Secondary | ICD-10-CM

## 2020-11-03 DIAGNOSIS — Z5111 Encounter for antineoplastic chemotherapy: Secondary | ICD-10-CM | POA: Diagnosis present

## 2020-11-03 LAB — CMP (CANCER CENTER ONLY)
ALT: 11 U/L (ref 0–44)
AST: 14 U/L — ABNORMAL LOW (ref 15–41)
Albumin: 4.1 g/dL (ref 3.5–5.0)
Alkaline Phosphatase: 43 U/L (ref 38–126)
Anion gap: 5 (ref 5–15)
BUN: 23 mg/dL (ref 8–23)
CO2: 33 mmol/L — ABNORMAL HIGH (ref 22–32)
Calcium: 9.6 mg/dL (ref 8.9–10.3)
Chloride: 103 mmol/L (ref 98–111)
Creatinine: 1.53 mg/dL — ABNORMAL HIGH (ref 0.61–1.24)
GFR, Estimated: 45 mL/min — ABNORMAL LOW (ref >60)
Glucose, Bld: 121 mg/dL — ABNORMAL HIGH (ref 70–99)
Potassium: 4 mmol/L (ref 3.5–5.1)
Sodium: 141 mmol/L (ref 135–145)
Total Bilirubin: 0.7 mg/dL (ref 0.3–1.2)
Total Protein: 6.1 g/dL — ABNORMAL LOW (ref 6.5–8.1)

## 2020-11-03 LAB — CBC WITH DIFFERENTIAL (CANCER CENTER ONLY)
Abs Immature Granulocytes: 0.02 10*3/uL (ref 0.00–0.07)
Basophils Absolute: 0.1 10*3/uL (ref 0.0–0.1)
Basophils Relative: 1 %
Eosinophils Absolute: 0.3 10*3/uL (ref 0.0–0.5)
Eosinophils Relative: 4 %
HCT: 41.4 % (ref 39.0–52.0)
Hemoglobin: 13.5 g/dL (ref 13.0–17.0)
Immature Granulocytes: 0 %
Lymphocytes Relative: 22 %
Lymphs Abs: 1.7 10*3/uL (ref 0.7–4.0)
MCH: 32.5 pg (ref 26.0–34.0)
MCHC: 32.6 g/dL (ref 30.0–36.0)
MCV: 99.5 fL (ref 80.0–100.0)
Monocytes Absolute: 0.8 10*3/uL (ref 0.1–1.0)
Monocytes Relative: 10 %
Neutro Abs: 5 10*3/uL (ref 1.7–7.7)
Neutrophils Relative %: 63 %
Platelet Count: 205 10*3/uL (ref 150–400)
RBC: 4.16 MIL/uL — ABNORMAL LOW (ref 4.22–5.81)
RDW: 12.4 % (ref 11.5–15.5)
WBC Count: 7.9 10*3/uL (ref 4.0–10.5)
nRBC: 0 % (ref 0.0–0.2)

## 2020-11-03 LAB — LACTATE DEHYDROGENASE: LDH: 167 U/L (ref 98–192)

## 2020-11-03 MED ORDER — BORTEZOMIB CHEMO SQ INJECTION 3.5 MG (2.5MG/ML)
1.3000 mg/m2 | Freq: Once | INTRAMUSCULAR | Status: AC
  Administered 2020-11-03: 2.25 mg via SUBCUTANEOUS
  Filled 2020-11-03: qty 0.9

## 2020-11-03 MED ORDER — DEXAMETHASONE 4 MG PO TABS
20.0000 mg | ORAL_TABLET | ORAL | Status: DC
  Administered 2020-11-03: 20 mg via ORAL

## 2020-11-03 MED ORDER — PROCHLORPERAZINE MALEATE 10 MG PO TABS
ORAL_TABLET | ORAL | Status: AC
  Filled 2020-11-03: qty 1

## 2020-11-03 MED ORDER — DEXAMETHASONE 4 MG PO TABS
ORAL_TABLET | ORAL | Status: AC
  Filled 2020-11-03: qty 5

## 2020-11-03 MED ORDER — PROCHLORPERAZINE MALEATE 10 MG PO TABS
10.0000 mg | ORAL_TABLET | Freq: Once | ORAL | Status: AC
  Administered 2020-11-03: 10 mg via ORAL

## 2020-11-03 NOTE — Patient Instructions (Signed)
Bortezomib injection What is this medicine? BORTEZOMIB (bor TEZ oh mib) targets proteins in cancer cells and stops the cancer cells from growing. It treats multiple myeloma and mantle cell lymphoma. This medicine may be used for other purposes; ask your health care provider or pharmacist if you have questions. COMMON BRAND NAME(S): Velcade What should I tell my health care provider before I take this medicine? They need to know if you have any of these conditions:  dehydration  diabetes (high blood sugar)  heart disease  liver disease  tingling of the fingers or toes or other nerve disorder  an unusual or allergic reaction to bortezomib, mannitol, boron, other medicines, foods, dyes, or preservatives  pregnant or trying to get pregnant  breast-feeding How should I use this medicine? This medicine is injected into a vein or under the skin. It is given by a health care provider in a hospital or clinic setting. Talk to your health care provider about the use of this medicine in children. Special care may be needed. Overdosage: If you think you have taken too much of this medicine contact a poison control center or emergency room at once. NOTE: This medicine is only for you. Do not share this medicine with others. What if I miss a dose? Keep appointments for follow-up doses. It is important not to miss your dose. Call your health care provider if you are unable to keep an appointment. What may interact with this medicine? This medicine may interact with the following medications:  ketoconazole  rifampin This list may not describe all possible interactions. Give your health care provider a list of all the medicines, herbs, non-prescription drugs, or dietary supplements you use. Also tell them if you smoke, drink alcohol, or use illegal drugs. Some items may interact with your medicine. What should I watch for while using this medicine? Your condition will be monitored carefully while  you are receiving this medicine. You may need blood work done while you are taking this medicine. You may get drowsy or dizzy. Do not drive, use machinery, or do anything that needs mental alertness until you know how this medicine affects you. Do not stand up or sit up quickly, especially if you are an older patient. This reduces the risk of dizzy or fainting spells This medicine may increase your risk of getting an infection. Call your health care provider for advice if you get a fever, chills, sore throat, or other symptoms of a cold or flu. Do not treat yourself. Try to avoid being around people who are sick. Check with your health care provider if you have severe diarrhea, nausea, and vomiting, or if you sweat a lot. The loss of too much body fluid may make it dangerous for you to take this medicine. Do not become pregnant while taking this medicine or for 7 months after stopping it. Women should inform their health care provider if they wish to become pregnant or think they might be pregnant. Men should not father a child while taking this medicine and for 4 months after stopping it. There is a potential for serious harm to an unborn child. Talk to your health care provider for more information. Do not breast-feed an infant while taking this medicine or for 2 months after stopping it. This medicine may make it more difficult to get pregnant or father a child. Talk to your health care provider if you are concerned about your fertility. What side effects may I notice from receiving this medicine?   Side effects that you should report to your doctor or health care professional as soon as possible:  allergic reactions (skin rash; itching or hives; swelling of the face, lips, or tongue)  bleeding (bloody or black, tarry stools; red or dark brown urine; spitting up blood or brown material that looks like coffee grounds; red spots on the skin; unusual bruising or bleeding from the eye, gums, or  nose)  blurred vision or changes in vision  confusion  constipation  headache  heart failure (trouble breathing; fast, irregular heartbeat; sudden weight gain; swelling of the ankles, feet, hands)  infection (fever, chills, cough, sore throat, pain or trouble passing urine)  lack or loss of appetite  liver injury (dark yellow or brown urine; general ill feeling or flu-like symptoms; loss of appetite, right upper belly pain; yellowing of the eyes or skin)  low blood pressure (dizziness; feeling faint or lightheaded, falls; unusually weak or tired)  muscle cramps  pain, redness, or irritation at site where injected  pain, tingling, numbness in the hands or feet  seizures  trouble breathing  unusual bruising or bleeding Side effects that usually do not require medical attention (report to your doctor or health care professional if they continue or are bothersome):  diarrhea  nausea  stomach pain  trouble sleeping  vomiting This list may not describe all possible side effects. Call your doctor for medical advice about side effects. You may report side effects to FDA at 1-800-FDA-1088. Where should I keep my medicine? This medicine is given in a hospital or clinic. It will not be stored at home. NOTE: This sheet is a summary. It may not cover all possible information. If you have questions about this medicine, talk to your doctor, pharmacist, or health care provider.  2021 Elsevier/Gold Standard (2020-09-01 13:22:53)

## 2020-11-03 NOTE — Progress Notes (Signed)
Hematology and Oncology Follow Up Visit  Shawn [facility] [id number] 01/21/1934 87 y.o. 11/03/2020   Principle Diagnosis:  IgG Kappa myeloma -- high risk due to t(4:21)  Current Therapy: Velcade/Decadron --started01/15/2021, s/p cycle12 Xgeva 120 mg IM q 3 months -- next dose on 12/2020   Interim History:  Shawn Bauer is here today for follow-up and treatment. He is doing well and has no complaints at this time.  He has been working outside. The snow has kept him busy.  He has occasional fatigue but this is mild.  January M-spike not observed, IgG level is 704 mg/dL and kappa light chains 209.6 mg/L.  No fever, chills, n/v, cough, rash, dizziness, SOB, chest pain, palpitations, abdominal pain or changes in bowel or bladder habits.  No swelling in his extremities.  He has occasional positional numbness and tingling in his left pinky finger.  No falls or syncope.  He has maintained a good appetite and is staying well hydrated. Shawn Bauer is stable at 143 lbs.   ECOG Performance Status: 1 - Symptomatic but completely ambulatory  Medications:  Allergies as of 11/03/2020   No Known Allergies     Medication List       Accurate as of November 03, 2020 11:25 AM. If you have any questions, ask your nurse or doctor.        aspirin EC 81 MG tablet Take 81 mg by mouth daily. Swallow whole.   budesonide-formoterol 160-4.5 MCG/ACT inhaler Commonly known as: SYMBICORT Inhale 2 puffs into the lungs 2 (two) times daily.   CINNAMON PO Take 1,000 mg by mouth daily.   cyanocobalamin 1000 MCG tablet Take 1,000 mcg by mouth daily.   lovastatin 40 MG tablet Commonly known as: MEVACOR Take 80 mg by mouth daily.   MELATONIN ER PO Take by mouth as needed.   metoprolol succinate 50 MG 24 hr tablet Commonly known as: TOPROL-XL Take 25 mg by mouth in the morning and at bedtime.   nitroGLYCERIN 0.4 MG SL tablet Commonly known as: NITROSTAT Place 1 tablet (0.4 mg total) under  the tongue every 5 (five) minutes as needed for chest pain.   olmesartan 20 MG tablet Commonly known as: BENICAR Take 20 mg by mouth daily.   traMADol 50 MG tablet Commonly known as: ULTRAM Take 50 mg by mouth in the morning and at bedtime.       Allergies: No Known Allergies  Past Medical History, Surgical history, Social history, and Family History were reviewed and updated.  Review of Systems: All other 10 point review of systems is negative.   Physical Exam:  Bauer is 143 lb 6.4 oz (65 kg). His oral temperature is 98.3 F (36.8 C). His blood pressure is 140/60 and his pulse is 72. His respiration is 20 and oxygen saturation is 99%.   Wt Readings from Last 3 Encounters:  11/03/20 143 lb 6.4 oz (65 kg)  10/06/20 145 lb 6.4 oz (66 kg)  09/01/20 145 lb (65.8 kg)    Ocular: Sclerae unicteric, pupils equal, round and reactive to light Ear-nose-throat: Oropharynx clear, dentition fair Lymphatic: No cervical or supraclavicular adenopathy Lungs no rales or rhonchi, good excursion bilaterally Heart regular rate and rhythm, no murmur appreciated Abd soft, nontender, positive bowel sounds MSK no focal spinal tenderness, no joint edema Neuro: non-focal, well-oriented, appropriate affect Breasts: Deferred   Lab Results  Component Value Date   WBC 7.9 11/03/2020   HGB 13.5 11/03/2020   HCT 41.4 11/03/2020  MCV 99.5 11/03/2020   PLT 205 11/03/2020   No results found for: FERRITIN, IRON, TIBC, UIBC, IRONPCTSAT Lab Results  Component Value Date   RBC 4.16 (L) 11/03/2020   Lab Results  Component Value Date   KPAFRELGTCHN 209.6 (H) 10/06/2020   LAMBDASER 10.4 10/06/2020   KAPLAMBRATIO 20.15 (H) 10/06/2020   Lab Results  Component Value Date   IGGSERUM 704 10/06/2020   IGA 120 10/06/2020   IGMSERUM 28 10/06/2020   Lab Results  Component Value Date   TOTALPROTELP 6.0 10/06/2020   ALBUMINELP 3.6 10/06/2020   A1GS 0.2 10/06/2020   A2GS 0.7 10/06/2020   BETS 0.8  10/06/2020   GAMS 0.7 10/06/2020   MSPIKE Not Observed 10/06/2020   SPEI Comment 06/10/2020     Chemistry      Component Value Date/Time   NA 141 11/03/2020 1037   K 4.0 11/03/2020 1037   CL 103 11/03/2020 1037   CO2 33 (H) 11/03/2020 1037   BUN 23 11/03/2020 1037   CREATININE 1.53 (H) 11/03/2020 1037      Component Value Date/Time   CALCIUM 9.6 11/03/2020 1037   ALKPHOS 43 11/03/2020 1037   AST 14 (L) 11/03/2020 1037   ALT 11 11/03/2020 1037   BILITOT 0.7 11/03/2020 1037       Impression and Plan: Shawn Bauer is a very pleasant 86yo caucasian gentleman with IgG kappa myeloma. Myeloma studies are pending.  We will proceed with treatment today as planned.  Follow-up in 1 month. Velcade injection weekly 3 weeks on and 1 week off.  He can contact our office with any questions or concerns.   Laverna Peace, NP 2/10/202211:25 AM

## 2020-11-04 LAB — IGG, IGA, IGM
IgA: 135 mg/dL (ref 61–437)
IgG (Immunoglobin G), Serum: 737 mg/dL (ref 603–1613)
IgM (Immunoglobulin M), Srm: 24 mg/dL (ref 15–143)

## 2020-11-04 LAB — KAPPA/LAMBDA LIGHT CHAINS
Kappa free light chain: 209.2 mg/L — ABNORMAL HIGH (ref 3.3–19.4)
Kappa, lambda light chain ratio: 18.85 — ABNORMAL HIGH (ref 0.26–1.65)
Lambda free light chains: 11.1 mg/L (ref 5.7–26.3)

## 2020-11-07 LAB — PROTEIN ELECTROPHORESIS, SERUM
A/G Ratio: 1.4 (ref 0.7–1.7)
Albumin ELP: 3.6 g/dL (ref 2.9–4.4)
Alpha-1-Globulin: 0.2 g/dL (ref 0.0–0.4)
Alpha-2-Globulin: 0.7 g/dL (ref 0.4–1.0)
Beta Globulin: 0.9 g/dL (ref 0.7–1.3)
Gamma Globulin: 0.7 g/dL (ref 0.4–1.8)
Globulin, Total: 2.5 g/dL (ref 2.2–3.9)
Total Protein ELP: 6.1 g/dL (ref 6.0–8.5)

## 2020-11-09 ENCOUNTER — Other Ambulatory Visit: Payer: Self-pay | Admitting: *Deleted

## 2020-11-09 DIAGNOSIS — C9000 Multiple myeloma not having achieved remission: Secondary | ICD-10-CM

## 2020-11-10 ENCOUNTER — Other Ambulatory Visit: Payer: Self-pay

## 2020-11-10 ENCOUNTER — Inpatient Hospital Stay: Payer: Medicare Other

## 2020-11-10 VITALS — BP 122/49 | HR 68 | Temp 98.3°F | Resp 20

## 2020-11-10 DIAGNOSIS — C9000 Multiple myeloma not having achieved remission: Secondary | ICD-10-CM

## 2020-11-10 DIAGNOSIS — Z5111 Encounter for antineoplastic chemotherapy: Secondary | ICD-10-CM | POA: Diagnosis not present

## 2020-11-10 LAB — CBC WITH DIFFERENTIAL (CANCER CENTER ONLY)
Abs Immature Granulocytes: 0.07 10*3/uL (ref 0.00–0.07)
Basophils Absolute: 0.1 10*3/uL (ref 0.0–0.1)
Basophils Relative: 1 %
Eosinophils Absolute: 0.3 10*3/uL (ref 0.0–0.5)
Eosinophils Relative: 3 %
HCT: 42.4 % (ref 39.0–52.0)
Hemoglobin: 13.9 g/dL (ref 13.0–17.0)
Immature Granulocytes: 1 %
Lymphocytes Relative: 23 %
Lymphs Abs: 2.7 10*3/uL (ref 0.7–4.0)
MCH: 32.3 pg (ref 26.0–34.0)
MCHC: 32.8 g/dL (ref 30.0–36.0)
MCV: 98.4 fL (ref 80.0–100.0)
Monocytes Absolute: 1.4 10*3/uL — ABNORMAL HIGH (ref 0.1–1.0)
Monocytes Relative: 12 %
Neutro Abs: 7.1 10*3/uL (ref 1.7–7.7)
Neutrophils Relative %: 60 %
Platelet Count: 171 10*3/uL (ref 150–400)
RBC: 4.31 MIL/uL (ref 4.22–5.81)
RDW: 12.2 % (ref 11.5–15.5)
WBC Count: 11.6 10*3/uL — ABNORMAL HIGH (ref 4.0–10.5)
nRBC: 0 % (ref 0.0–0.2)

## 2020-11-10 LAB — COMPREHENSIVE METABOLIC PANEL
ALT: 12 U/L (ref 0–44)
AST: 13 U/L — ABNORMAL LOW (ref 15–41)
Albumin: 4 g/dL (ref 3.5–5.0)
Alkaline Phosphatase: 44 U/L (ref 38–126)
Anion gap: 4 — ABNORMAL LOW (ref 5–15)
BUN: 22 mg/dL (ref 8–23)
CO2: 33 mmol/L — ABNORMAL HIGH (ref 22–32)
Calcium: 10 mg/dL (ref 8.9–10.3)
Chloride: 105 mmol/L (ref 98–111)
Creatinine, Ser: 1.61 mg/dL — ABNORMAL HIGH (ref 0.61–1.24)
GFR, Estimated: 42 mL/min — ABNORMAL LOW (ref >60)
Glucose, Bld: 115 mg/dL — ABNORMAL HIGH (ref 70–99)
Potassium: 5.3 mmol/L — ABNORMAL HIGH (ref 3.5–5.1)
Sodium: 142 mmol/L (ref 135–145)
Total Bilirubin: 0.6 mg/dL (ref 0.3–1.2)
Total Protein: 6.2 g/dL — ABNORMAL LOW (ref 6.5–8.1)

## 2020-11-10 MED ORDER — BORTEZOMIB CHEMO SQ INJECTION 3.5 MG (2.5MG/ML)
1.3000 mg/m2 | Freq: Once | INTRAMUSCULAR | Status: AC
  Administered 2020-11-10: 2.25 mg via SUBCUTANEOUS
  Filled 2020-11-10: qty 0.9

## 2020-11-10 MED ORDER — PROCHLORPERAZINE MALEATE 10 MG PO TABS
ORAL_TABLET | ORAL | Status: AC
  Filled 2020-11-10: qty 1

## 2020-11-10 MED ORDER — PROCHLORPERAZINE MALEATE 10 MG PO TABS
10.0000 mg | ORAL_TABLET | Freq: Once | ORAL | Status: AC
  Administered 2020-11-10: 10 mg via ORAL

## 2020-11-10 MED ORDER — DEXAMETHASONE 4 MG PO TABS
ORAL_TABLET | ORAL | Status: AC
  Filled 2020-11-10: qty 5

## 2020-11-10 MED ORDER — DEXAMETHASONE 4 MG PO TABS
20.0000 mg | ORAL_TABLET | ORAL | Status: DC
  Administered 2020-11-10: 20 mg via ORAL

## 2020-11-10 NOTE — Progress Notes (Signed)
Delay during cycle 13, additional day will be added on 2/24 so that there will be 3 weeks on Velcade, 1 week off. Orders added per Dr. Antonieta Pert instructions.

## 2020-11-16 ENCOUNTER — Other Ambulatory Visit: Payer: Self-pay

## 2020-11-16 DIAGNOSIS — C9000 Multiple myeloma not having achieved remission: Secondary | ICD-10-CM

## 2020-11-17 ENCOUNTER — Inpatient Hospital Stay: Payer: Medicare Other

## 2020-11-17 ENCOUNTER — Other Ambulatory Visit: Payer: Self-pay

## 2020-11-17 VITALS — BP 128/91 | HR 77 | Temp 99.3°F | Resp 18

## 2020-11-17 DIAGNOSIS — C9000 Multiple myeloma not having achieved remission: Secondary | ICD-10-CM

## 2020-11-17 DIAGNOSIS — Z5111 Encounter for antineoplastic chemotherapy: Secondary | ICD-10-CM | POA: Diagnosis not present

## 2020-11-17 LAB — CBC WITH DIFFERENTIAL (CANCER CENTER ONLY)
Abs Immature Granulocytes: 0.08 10*3/uL — ABNORMAL HIGH (ref 0.00–0.07)
Basophils Absolute: 0.1 10*3/uL (ref 0.0–0.1)
Basophils Relative: 1 %
Eosinophils Absolute: 0.4 10*3/uL (ref 0.0–0.5)
Eosinophils Relative: 4 %
HCT: 41.6 % (ref 39.0–52.0)
Hemoglobin: 13.6 g/dL (ref 13.0–17.0)
Immature Granulocytes: 1 %
Lymphocytes Relative: 28 %
Lymphs Abs: 2.9 10*3/uL (ref 0.7–4.0)
MCH: 32.5 pg (ref 26.0–34.0)
MCHC: 32.7 g/dL (ref 30.0–36.0)
MCV: 99.3 fL (ref 80.0–100.0)
Monocytes Absolute: 1 10*3/uL (ref 0.1–1.0)
Monocytes Relative: 10 %
Neutro Abs: 6 10*3/uL (ref 1.7–7.7)
Neutrophils Relative %: 56 %
Platelet Count: 155 10*3/uL (ref 150–400)
RBC: 4.19 MIL/uL — ABNORMAL LOW (ref 4.22–5.81)
RDW: 12.4 % (ref 11.5–15.5)
WBC Count: 10.4 10*3/uL (ref 4.0–10.5)
nRBC: 0 % (ref 0.0–0.2)

## 2020-11-17 LAB — CMP (CANCER CENTER ONLY)
ALT: 13 U/L (ref 0–44)
AST: 15 U/L (ref 15–41)
Albumin: 3.9 g/dL (ref 3.5–5.0)
Alkaline Phosphatase: 40 U/L (ref 38–126)
Anion gap: 5 (ref 5–15)
BUN: 22 mg/dL (ref 8–23)
CO2: 30 mmol/L (ref 22–32)
Calcium: 9.3 mg/dL (ref 8.9–10.3)
Chloride: 107 mmol/L (ref 98–111)
Creatinine: 1.54 mg/dL — ABNORMAL HIGH (ref 0.61–1.24)
GFR, Estimated: 44 mL/min — ABNORMAL LOW (ref >60)
Glucose, Bld: 123 mg/dL — ABNORMAL HIGH (ref 70–99)
Potassium: 5.5 mmol/L — ABNORMAL HIGH (ref 3.5–5.1)
Sodium: 142 mmol/L (ref 135–145)
Total Bilirubin: 0.7 mg/dL (ref 0.3–1.2)
Total Protein: 6.1 g/dL — ABNORMAL LOW (ref 6.5–8.1)

## 2020-11-17 MED ORDER — PROCHLORPERAZINE MALEATE 10 MG PO TABS
ORAL_TABLET | ORAL | Status: AC
  Filled 2020-11-17: qty 1

## 2020-11-17 MED ORDER — DEXAMETHASONE 4 MG PO TABS
20.0000 mg | ORAL_TABLET | ORAL | Status: DC
  Administered 2020-11-17: 20 mg via ORAL

## 2020-11-17 MED ORDER — PROCHLORPERAZINE MALEATE 10 MG PO TABS
10.0000 mg | ORAL_TABLET | Freq: Once | ORAL | Status: AC
  Administered 2020-11-17: 10 mg via ORAL

## 2020-11-17 MED ORDER — BORTEZOMIB CHEMO SQ INJECTION 3.5 MG (2.5MG/ML)
1.3000 mg/m2 | Freq: Once | INTRAMUSCULAR | Status: AC
Start: 2020-11-17 — End: 2020-11-17
  Administered 2020-11-17: 2.25 mg via SUBCUTANEOUS
  Filled 2020-11-17: qty 0.9

## 2020-11-17 MED ORDER — DEXAMETHASONE 4 MG PO TABS
ORAL_TABLET | ORAL | Status: AC
  Filled 2020-11-17: qty 5

## 2020-11-17 NOTE — Patient Instructions (Signed)
Bortezomib injection What is this medicine? BORTEZOMIB (bor TEZ oh mib) targets proteins in cancer cells and stops the cancer cells from growing. It treats multiple myeloma and mantle cell lymphoma. This medicine may be used for other purposes; ask your health care provider or pharmacist if you have questions. COMMON BRAND NAME(S): Velcade What should I tell my health care provider before I take this medicine? They need to know if you have any of these conditions:  dehydration  diabetes (high blood sugar)  heart disease  liver disease  tingling of the fingers or toes or other nerve disorder  an unusual or allergic reaction to bortezomib, mannitol, boron, other medicines, foods, dyes, or preservatives  pregnant or trying to get pregnant  breast-feeding How should I use this medicine? This medicine is injected into a vein or under the skin. It is given by a health care provider in a hospital or clinic setting. Talk to your health care provider about the use of this medicine in children. Special care may be needed. Overdosage: If you think you have taken too much of this medicine contact a poison control center or emergency room at once. NOTE: This medicine is only for you. Do not share this medicine with others. What if I miss a dose? Keep appointments for follow-up doses. It is important not to miss your dose. Call your health care provider if you are unable to keep an appointment. What may interact with this medicine? This medicine may interact with the following medications:  ketoconazole  rifampin This list may not describe all possible interactions. Give your health care provider a list of all the medicines, herbs, non-prescription drugs, or dietary supplements you use. Also tell them if you smoke, drink alcohol, or use illegal drugs. Some items may interact with your medicine. What should I watch for while using this medicine? Your condition will be monitored carefully while  you are receiving this medicine. You may need blood work done while you are taking this medicine. You may get drowsy or dizzy. Do not drive, use machinery, or do anything that needs mental alertness until you know how this medicine affects you. Do not stand up or sit up quickly, especially if you are an older patient. This reduces the risk of dizzy or fainting spells This medicine may increase your risk of getting an infection. Call your health care provider for advice if you get a fever, chills, sore throat, or other symptoms of a cold or flu. Do not treat yourself. Try to avoid being around people who are sick. Check with your health care provider if you have severe diarrhea, nausea, and vomiting, or if you sweat a lot. The loss of too much body fluid may make it dangerous for you to take this medicine. Do not become pregnant while taking this medicine or for 7 months after stopping it. Women should inform their health care provider if they wish to become pregnant or think they might be pregnant. Men should not father a child while taking this medicine and for 4 months after stopping it. There is a potential for serious harm to an unborn child. Talk to your health care provider for more information. Do not breast-feed an infant while taking this medicine or for 2 months after stopping it. This medicine may make it more difficult to get pregnant or father a child. Talk to your health care provider if you are concerned about your fertility. What side effects may I notice from receiving this medicine?   Side effects that you should report to your doctor or health care professional as soon as possible:  allergic reactions (skin rash; itching or hives; swelling of the face, lips, or tongue)  bleeding (bloody or black, tarry stools; red or dark brown urine; spitting up blood or brown material that looks like coffee grounds; red spots on the skin; unusual bruising or bleeding from the eye, gums, or  nose)  blurred vision or changes in vision  confusion  constipation  headache  heart failure (trouble breathing; fast, irregular heartbeat; sudden weight gain; swelling of the ankles, feet, hands)  infection (fever, chills, cough, sore throat, pain or trouble passing urine)  lack or loss of appetite  liver injury (dark yellow or brown urine; general ill feeling or flu-like symptoms; loss of appetite, right upper belly pain; yellowing of the eyes or skin)  low blood pressure (dizziness; feeling faint or lightheaded, falls; unusually weak or tired)  muscle cramps  pain, redness, or irritation at site where injected  pain, tingling, numbness in the hands or feet  seizures  trouble breathing  unusual bruising or bleeding Side effects that usually do not require medical attention (report to your doctor or health care professional if they continue or are bothersome):  diarrhea  nausea  stomach pain  trouble sleeping  vomiting This list may not describe all possible side effects. Call your doctor for medical advice about side effects. You may report side effects to FDA at 1-800-FDA-1088. Where should I keep my medicine? This medicine is given in a hospital or clinic. It will not be stored at home. NOTE: This sheet is a summary. It may not cover all possible information. If you have questions about this medicine, talk to your doctor, pharmacist, or health care provider.  2021 Elsevier/Gold Standard (2020-09-01 13:22:53)

## 2020-12-01 ENCOUNTER — Telehealth: Payer: Self-pay

## 2020-12-01 ENCOUNTER — Inpatient Hospital Stay: Payer: Medicare Other | Attending: Family

## 2020-12-01 ENCOUNTER — Other Ambulatory Visit: Payer: Self-pay

## 2020-12-01 ENCOUNTER — Inpatient Hospital Stay: Payer: Medicare Other

## 2020-12-01 ENCOUNTER — Inpatient Hospital Stay (HOSPITAL_BASED_OUTPATIENT_CLINIC_OR_DEPARTMENT_OTHER): Payer: Medicare Other | Admitting: Family

## 2020-12-01 VITALS — BP 126/64 | HR 75 | Temp 98.2°F | Resp 17 | Wt 144.0 lb

## 2020-12-01 DIAGNOSIS — Z79899 Other long term (current) drug therapy: Secondary | ICD-10-CM | POA: Diagnosis not present

## 2020-12-01 DIAGNOSIS — C9000 Multiple myeloma not having achieved remission: Secondary | ICD-10-CM | POA: Insufficient documentation

## 2020-12-01 DIAGNOSIS — R5381 Other malaise: Secondary | ICD-10-CM | POA: Insufficient documentation

## 2020-12-01 DIAGNOSIS — Z5111 Encounter for antineoplastic chemotherapy: Secondary | ICD-10-CM | POA: Diagnosis not present

## 2020-12-01 LAB — CBC WITH DIFFERENTIAL (CANCER CENTER ONLY)
Abs Immature Granulocytes: 0.03 10*3/uL (ref 0.00–0.07)
Basophils Absolute: 0.1 10*3/uL (ref 0.0–0.1)
Basophils Relative: 1 %
Eosinophils Absolute: 0.3 10*3/uL (ref 0.0–0.5)
Eosinophils Relative: 4 %
HCT: 40.8 % (ref 39.0–52.0)
Hemoglobin: 13 g/dL (ref 13.0–17.0)
Immature Granulocytes: 0 %
Lymphocytes Relative: 24 %
Lymphs Abs: 1.9 10*3/uL (ref 0.7–4.0)
MCH: 31.7 pg (ref 26.0–34.0)
MCHC: 31.9 g/dL (ref 30.0–36.0)
MCV: 99.5 fL (ref 80.0–100.0)
Monocytes Absolute: 0.7 10*3/uL (ref 0.1–1.0)
Monocytes Relative: 9 %
Neutro Abs: 5 10*3/uL (ref 1.7–7.7)
Neutrophils Relative %: 62 %
Platelet Count: 199 10*3/uL (ref 150–400)
RBC: 4.1 MIL/uL — ABNORMAL LOW (ref 4.22–5.81)
RDW: 12.5 % (ref 11.5–15.5)
WBC Count: 8.1 10*3/uL (ref 4.0–10.5)
nRBC: 0 % (ref 0.0–0.2)

## 2020-12-01 LAB — CMP (CANCER CENTER ONLY)
ALT: 12 U/L (ref 0–44)
AST: 13 U/L — ABNORMAL LOW (ref 15–41)
Albumin: 4 g/dL (ref 3.5–5.0)
Alkaline Phosphatase: 43 U/L (ref 38–126)
Anion gap: 6 (ref 5–15)
BUN: 20 mg/dL (ref 8–23)
CO2: 31 mmol/L (ref 22–32)
Calcium: 9.4 mg/dL (ref 8.9–10.3)
Chloride: 105 mmol/L (ref 98–111)
Creatinine: 1.6 mg/dL — ABNORMAL HIGH (ref 0.61–1.24)
GFR, Estimated: 42 mL/min — ABNORMAL LOW (ref >60)
Glucose, Bld: 172 mg/dL — ABNORMAL HIGH (ref 70–99)
Potassium: 4 mmol/L (ref 3.5–5.1)
Sodium: 142 mmol/L (ref 135–145)
Total Bilirubin: 0.7 mg/dL (ref 0.3–1.2)
Total Protein: 6 g/dL — ABNORMAL LOW (ref 6.5–8.1)

## 2020-12-01 LAB — LACTATE DEHYDROGENASE: LDH: 159 U/L (ref 98–192)

## 2020-12-01 MED ORDER — BORTEZOMIB CHEMO SQ INJECTION 3.5 MG (2.5MG/ML)
1.3000 mg/m2 | Freq: Once | INTRAMUSCULAR | Status: AC
  Administered 2020-12-01: 2.25 mg via SUBCUTANEOUS
  Filled 2020-12-01: qty 0.9

## 2020-12-01 MED ORDER — DEXAMETHASONE 4 MG PO TABS
ORAL_TABLET | ORAL | Status: AC
  Filled 2020-12-01: qty 5

## 2020-12-01 MED ORDER — PROCHLORPERAZINE MALEATE 10 MG PO TABS
ORAL_TABLET | ORAL | Status: AC
  Filled 2020-12-01: qty 1

## 2020-12-01 MED ORDER — PROCHLORPERAZINE MALEATE 10 MG PO TABS
10.0000 mg | ORAL_TABLET | Freq: Once | ORAL | Status: AC
Start: 2020-12-01 — End: 2020-12-01
  Administered 2020-12-01: 10 mg via ORAL

## 2020-12-01 MED ORDER — DEXAMETHASONE 4 MG PO TABS
20.0000 mg | ORAL_TABLET | ORAL | Status: DC
  Administered 2020-12-01: 20 mg via ORAL

## 2020-12-01 NOTE — Patient Instructions (Signed)
Bortezomib injection What is this medicine? BORTEZOMIB (bor TEZ oh mib) targets proteins in cancer cells and stops the cancer cells from growing. It treats multiple myeloma and mantle cell lymphoma. This medicine may be used for other purposes; ask your health care provider or pharmacist if you have questions. COMMON BRAND NAME(S): Velcade What should I tell my health care provider before I take this medicine? They need to know if you have any of these conditions:  dehydration  diabetes (high blood sugar)  heart disease  liver disease  tingling of the fingers or toes or other nerve disorder  an unusual or allergic reaction to bortezomib, mannitol, boron, other medicines, foods, dyes, or preservatives  pregnant or trying to get pregnant  breast-feeding How should I use this medicine? This medicine is injected into a vein or under the skin. It is given by a health care provider in a hospital or clinic setting. Talk to your health care provider about the use of this medicine in children. Special care may be needed. Overdosage: If you think you have taken too much of this medicine contact a poison control center or emergency room at once. NOTE: This medicine is only for you. Do not share this medicine with others. What if I miss a dose? Keep appointments for follow-up doses. It is important not to miss your dose. Call your health care provider if you are unable to keep an appointment. What may interact with this medicine? This medicine may interact with the following medications:  ketoconazole  rifampin This list may not describe all possible interactions. Give your health care provider a list of all the medicines, herbs, non-prescription drugs, or dietary supplements you use. Also tell them if you smoke, drink alcohol, or use illegal drugs. Some items may interact with your medicine. What should I watch for while using this medicine? Your condition will be monitored carefully while  you are receiving this medicine. You may need blood work done while you are taking this medicine. You may get drowsy or dizzy. Do not drive, use machinery, or do anything that needs mental alertness until you know how this medicine affects you. Do not stand up or sit up quickly, especially if you are an older patient. This reduces the risk of dizzy or fainting spells This medicine may increase your risk of getting an infection. Call your health care provider for advice if you get a fever, chills, sore throat, or other symptoms of a cold or flu. Do not treat yourself. Try to avoid being around people who are sick. Check with your health care provider if you have severe diarrhea, nausea, and vomiting, or if you sweat a lot. The loss of too much body fluid may make it dangerous for you to take this medicine. Do not become pregnant while taking this medicine or for 7 months after stopping it. Women should inform their health care provider if they wish to become pregnant or think they might be pregnant. Men should not father a child while taking this medicine and for 4 months after stopping it. There is a potential for serious harm to an unborn child. Talk to your health care provider for more information. Do not breast-feed an infant while taking this medicine or for 2 months after stopping it. This medicine may make it more difficult to get pregnant or father a child. Talk to your health care provider if you are concerned about your fertility. What side effects may I notice from receiving this medicine?   Side effects that you should report to your doctor or health care professional as soon as possible:  allergic reactions (skin rash; itching or hives; swelling of the face, lips, or tongue)  bleeding (bloody or black, tarry stools; red or dark brown urine; spitting up blood or brown material that looks like coffee grounds; red spots on the skin; unusual bruising or bleeding from the eye, gums, or  nose)  blurred vision or changes in vision  confusion  constipation  headache  heart failure (trouble breathing; fast, irregular heartbeat; sudden weight gain; swelling of the ankles, feet, hands)  infection (fever, chills, cough, sore throat, pain or trouble passing urine)  lack or loss of appetite  liver injury (dark yellow or brown urine; general ill feeling or flu-like symptoms; loss of appetite, right upper belly pain; yellowing of the eyes or skin)  low blood pressure (dizziness; feeling faint or lightheaded, falls; unusually weak or tired)  muscle cramps  pain, redness, or irritation at site where injected  pain, tingling, numbness in the hands or feet  seizures  trouble breathing  unusual bruising or bleeding Side effects that usually do not require medical attention (report to your doctor or health care professional if they continue or are bothersome):  diarrhea  nausea  stomach pain  trouble sleeping  vomiting This list may not describe all possible side effects. Call your doctor for medical advice about side effects. You may report side effects to FDA at 1-800-FDA-1088. Where should I keep my medicine? This medicine is given in a hospital or clinic. It will not be stored at home. NOTE: This sheet is a summary. It may not cover all possible information. If you have questions about this medicine, talk to your doctor, pharmacist, or health care provider.  2021 Elsevier/Gold Standard (2020-09-01 13:22:53)

## 2020-12-01 NOTE — Telephone Encounter (Signed)
appts made per 12/01/20 los and pt will rec sch in chemo/avs   Shawn Bauer

## 2020-12-01 NOTE — Progress Notes (Signed)
Hematology and Oncology Follow Up Visit  Shawn Bauer [id number] Feb 16, 1934 87 y.o. 12/01/2020   Principle Diagnosis:  IgG Kappa myeloma -- high risk due to t(4:21)  Current Therapy: Velcade/Decadron --started01/15/2021, s/p cycle13 Xgeva 120 mg IM q 3 months -- next dose on 12/2020   Interim History: Shawn Bauer is here today for follow-up. He is doing well but noes some fatigue at times.  He is still getting our and working in his yard and around the house.  He states that he has never been one that sleeps well throughout the night. He will nap during the day if needed.  In February, M-spike was not detected. IgG level was 737 mg/dL and kappa light chains 20.92 mg/dL.  No fever, chills, n/v, cough, rash, dizziness, chest pain, palpitations, abdominal pain or changes in bowel or bladder habits.  He has some SOB with asthma exacerbations and uses his inhaler as needed.  No swelling or tenderness in his extremities at this time.  No falls or syncope to report.  He has maintained a good appetite and is staying well hydrated. His weight is stable at 144 lbs.   ECOG Performance Status: 1 - Symptomatic but completely ambulatory  Medications:  Allergies as of 12/01/2020   No Known Allergies     Medication List       Accurate as of December 01, 2020 11:37 AM. If you have any questions, ask your nurse or doctor.        aspirin EC 81 MG tablet Take 81 mg by mouth daily. Swallow whole.   budesonide-formoterol 160-4.5 MCG/ACT inhaler Commonly known as: SYMBICORT Inhale 2 puffs into the lungs 2 (two) times daily.   CINNAMON PO Take 1,000 mg by mouth daily.   cyanocobalamin 1000 MCG tablet Take 1,000 mcg by mouth daily.   lovastatin 40 MG tablet Commonly known as: MEVACOR Take 80 mg by mouth daily.   MELATONIN ER PO Take by mouth as needed.   metoprolol succinate 50 MG 24 hr tablet Commonly known as: TOPROL-XL Take 25 mg by mouth in the morning and at  bedtime.   nitroGLYCERIN 0.4 MG SL tablet Commonly known as: NITROSTAT Place 1 tablet (0.4 mg total) under the tongue every 5 (five) minutes as needed for chest pain.   olmesartan 20 MG tablet Commonly known as: BENICAR Take 20 mg by mouth daily.   traMADol 50 MG tablet Commonly known as: ULTRAM Take 50 mg by mouth in the morning and at bedtime.       Allergies: No Known Allergies  Past Medical History, Surgical history, Social history, and Family History were reviewed and updated.  Review of Systems: All other 10 point review of systems is negative.   Physical Exam:  weight is 144 lb (65.3 kg). His oral temperature is 98.2 F (36.8 C). His blood pressure is 126/64 and his pulse is 75. His respiration is 17 and oxygen saturation is 95%.   Wt Readings from Last 3 Encounters:  12/01/20 144 lb (65.3 kg)  11/03/20 143 lb 6.4 oz (65 kg)  10/06/20 145 lb 6.4 oz (66 kg)    Ocular: Sclerae unicteric, pupils equal, round and reactive to light Ear-nose-throat: Oropharynx clear, dentition fair Lymphatic: No cervical or supraclavicular adenopathy Lungs no rales or rhonchi, good excursion bilaterally Heart regular rate and rhythm, no murmur appreciated Abd soft, nontender, positive bowel sounds MSK no focal spinal tenderness, no joint edema Neuro: non-focal, well-oriented, appropriate affect Breasts: Deferred   Lab Results  Component Value Date   WBC 8.1 12/01/2020   HGB 13.0 12/01/2020   HCT 40.8 12/01/2020   MCV 99.5 12/01/2020   PLT 199 12/01/2020   No results found for: FERRITIN, IRON, TIBC, UIBC, IRONPCTSAT Lab Results  Component Value Date   RBC 4.10 (L) 12/01/2020   Lab Results  Component Value Date   KPAFRELGTCHN 209.2 (H) 11/03/2020   LAMBDASER 11.1 11/03/2020   KAPLAMBRATIO 18.85 (H) 11/03/2020   Lab Results  Component Value Date   IGGSERUM 737 11/03/2020   IGA 135 11/03/2020   IGMSERUM 24 11/03/2020   Lab Results  Component Value Date    TOTALPROTELP 6.1 11/03/2020   ALBUMINELP 3.6 11/03/2020   A1GS 0.2 11/03/2020   A2GS 0.7 11/03/2020   BETS 0.9 11/03/2020   GAMS 0.7 11/03/2020   MSPIKE Not Observed 11/03/2020   SPEI Comment 11/03/2020     Chemistry      Component Value Date/Time   NA 142 12/01/2020 0937   K 4.0 12/01/2020 0937   CL 105 12/01/2020 0937   CO2 31 12/01/2020 0937   BUN 20 12/01/2020 0937   CREATININE 1.60 (H) 12/01/2020 0937      Component Value Date/Time   CALCIUM 9.4 12/01/2020 0937   ALKPHOS 43 12/01/2020 0937   AST 13 (L) 12/01/2020 0937   ALT 12 12/01/2020 0937   BILITOT 0.7 12/01/2020 0937       Impression and Plan: Shawn Bauer is a very pleasant 87yo caucasian gentleman with IgG kappa myeloma. Myeloma studies have been stable. Today's results are pending.  We will proceed with treatment today as planned.  Follow-up in 1 month with Velcade injections weekly 3 weeks on and 1 week off.  He can contact our office with any questions or concerns. We can certainly see him sooner if needed.   Laverna Peace, NP 3/10/202211:37 AM

## 2020-12-02 LAB — IGG, IGA, IGM
IgA: 119 mg/dL (ref 61–437)
IgG (Immunoglobin G), Serum: 666 mg/dL (ref 603–1613)
IgM (Immunoglobulin M), Srm: 25 mg/dL (ref 15–143)

## 2020-12-02 LAB — KAPPA/LAMBDA LIGHT CHAINS
Kappa free light chain: 191.5 mg/L — ABNORMAL HIGH (ref 3.3–19.4)
Kappa, lambda light chain ratio: 17.1 — ABNORMAL HIGH (ref 0.26–1.65)
Lambda free light chains: 11.2 mg/L (ref 5.7–26.3)

## 2020-12-05 LAB — PROTEIN ELECTROPHORESIS, SERUM
A/G Ratio: 1.4 (ref 0.7–1.7)
Albumin ELP: 3.3 g/dL (ref 2.9–4.4)
Alpha-1-Globulin: 0.2 g/dL (ref 0.0–0.4)
Alpha-2-Globulin: 0.7 g/dL (ref 0.4–1.0)
Beta Globulin: 0.8 g/dL (ref 0.7–1.3)
Gamma Globulin: 0.6 g/dL (ref 0.4–1.8)
Globulin, Total: 2.4 g/dL (ref 2.2–3.9)
Total Protein ELP: 5.7 g/dL — ABNORMAL LOW (ref 6.0–8.5)

## 2020-12-08 ENCOUNTER — Other Ambulatory Visit: Payer: Self-pay | Admitting: *Deleted

## 2020-12-08 DIAGNOSIS — C9000 Multiple myeloma not having achieved remission: Secondary | ICD-10-CM

## 2020-12-09 ENCOUNTER — Other Ambulatory Visit: Payer: Self-pay

## 2020-12-09 ENCOUNTER — Inpatient Hospital Stay: Payer: Medicare Other

## 2020-12-09 VITALS — BP 151/53 | HR 78 | Temp 99.2°F | Resp 17

## 2020-12-09 DIAGNOSIS — C9000 Multiple myeloma not having achieved remission: Secondary | ICD-10-CM

## 2020-12-09 DIAGNOSIS — Z5111 Encounter for antineoplastic chemotherapy: Secondary | ICD-10-CM | POA: Diagnosis not present

## 2020-12-09 LAB — CMP (CANCER CENTER ONLY)
ALT: 11 U/L (ref 0–44)
AST: 12 U/L — ABNORMAL LOW (ref 15–41)
Albumin: 3.8 g/dL (ref 3.5–5.0)
Alkaline Phosphatase: 37 U/L — ABNORMAL LOW (ref 38–126)
Anion gap: 5 (ref 5–15)
BUN: 20 mg/dL (ref 8–23)
CO2: 31 mmol/L (ref 22–32)
Calcium: 9.4 mg/dL (ref 8.9–10.3)
Chloride: 105 mmol/L (ref 98–111)
Creatinine: 1.54 mg/dL — ABNORMAL HIGH (ref 0.61–1.24)
GFR, Estimated: 44 mL/min — ABNORMAL LOW (ref >60)
Glucose, Bld: 124 mg/dL — ABNORMAL HIGH (ref 70–99)
Potassium: 4.5 mmol/L (ref 3.5–5.1)
Sodium: 141 mmol/L (ref 135–145)
Total Bilirubin: 0.6 mg/dL (ref 0.3–1.2)
Total Protein: 6 g/dL — ABNORMAL LOW (ref 6.5–8.1)

## 2020-12-09 LAB — CBC WITH DIFFERENTIAL (CANCER CENTER ONLY)
Abs Immature Granulocytes: 0.03 10*3/uL (ref 0.00–0.07)
Basophils Absolute: 0 10*3/uL (ref 0.0–0.1)
Basophils Relative: 1 %
Eosinophils Absolute: 0.2 10*3/uL (ref 0.0–0.5)
Eosinophils Relative: 3 %
HCT: 40.5 % (ref 39.0–52.0)
Hemoglobin: 13.1 g/dL (ref 13.0–17.0)
Immature Granulocytes: 0 %
Lymphocytes Relative: 28 %
Lymphs Abs: 2.2 10*3/uL (ref 0.7–4.0)
MCH: 31.7 pg (ref 26.0–34.0)
MCHC: 32.3 g/dL (ref 30.0–36.0)
MCV: 98.1 fL (ref 80.0–100.0)
Monocytes Absolute: 1 10*3/uL (ref 0.1–1.0)
Monocytes Relative: 13 %
Neutro Abs: 4.3 10*3/uL (ref 1.7–7.7)
Neutrophils Relative %: 55 %
Platelet Count: 162 10*3/uL (ref 150–400)
RBC: 4.13 MIL/uL — ABNORMAL LOW (ref 4.22–5.81)
RDW: 12.7 % (ref 11.5–15.5)
WBC Count: 7.8 10*3/uL (ref 4.0–10.5)
nRBC: 0 % (ref 0.0–0.2)

## 2020-12-09 MED ORDER — PROCHLORPERAZINE MALEATE 10 MG PO TABS
ORAL_TABLET | ORAL | Status: AC
  Filled 2020-12-09: qty 1

## 2020-12-09 MED ORDER — DEXAMETHASONE 4 MG PO TABS
20.0000 mg | ORAL_TABLET | ORAL | Status: DC
  Administered 2020-12-09: 20 mg via ORAL

## 2020-12-09 MED ORDER — BORTEZOMIB CHEMO SQ INJECTION 3.5 MG (2.5MG/ML)
1.3000 mg/m2 | Freq: Once | INTRAMUSCULAR | Status: AC
  Administered 2020-12-09: 2.25 mg via SUBCUTANEOUS
  Filled 2020-12-09: qty 0.9

## 2020-12-09 MED ORDER — PROCHLORPERAZINE MALEATE 10 MG PO TABS
10.0000 mg | ORAL_TABLET | Freq: Once | ORAL | Status: AC
  Administered 2020-12-09: 10 mg via ORAL

## 2020-12-09 MED ORDER — DEXAMETHASONE 4 MG PO TABS
ORAL_TABLET | ORAL | Status: AC
  Filled 2020-12-09: qty 5

## 2020-12-09 NOTE — Progress Notes (Signed)
Ok to treat with creatinine of 1.54 per Judson Roch, NP. dph

## 2020-12-09 NOTE — Patient Instructions (Signed)
[facility] Discharge Instructions for Patients Receiving Chemotherapy  Today you received the following chemotherapy agents Velcade  To help prevent nausea and vomiting after your treatment, we encourage you to take your nausea medication as prescribed by MD.   If you develop nausea and vomiting that is not controlled by your nausea medication, call the clinic.   BELOW ARE SYMPTOMS THAT SHOULD BE REPORTED IMMEDIATELY:  *FEVER GREATER THAN 100.5 F  *CHILLS WITH OR WITHOUT FEVER  NAUSEA AND VOMITING THAT IS NOT CONTROLLED WITH YOUR NAUSEA MEDICATION  *UNUSUAL SHORTNESS OF BREATH  *UNUSUAL BRUISING OR BLEEDING  TENDERNESS IN MOUTH AND THROAT WITH OR WITHOUT PRESENCE OF ULCERS  *URINARY PROBLEMS  *BOWEL PROBLEMS  UNUSUAL RASH Items with * indicate a potential emergency and should be followed up as soon as possible.  Feel free to call the clinic should you have any questions or concerns. The clinic phone number is [phone].  Please show the [facility] at check-in to the Emergency Department and triage nurse.

## 2020-12-22 ENCOUNTER — Other Ambulatory Visit: Payer: Self-pay | Admitting: *Deleted

## 2020-12-22 DIAGNOSIS — C9000 Multiple myeloma not having achieved remission: Secondary | ICD-10-CM

## 2020-12-23 ENCOUNTER — Inpatient Hospital Stay: Payer: Medicare Other | Attending: Family

## 2020-12-23 ENCOUNTER — Other Ambulatory Visit: Payer: Self-pay

## 2020-12-23 ENCOUNTER — Inpatient Hospital Stay: Payer: Medicare Other

## 2020-12-23 VITALS — BP 144/65 | HR 80 | Temp 97.5°F | Resp 18

## 2020-12-23 DIAGNOSIS — Z5111 Encounter for antineoplastic chemotherapy: Secondary | ICD-10-CM | POA: Diagnosis present

## 2020-12-23 DIAGNOSIS — Z79899 Other long term (current) drug therapy: Secondary | ICD-10-CM | POA: Diagnosis not present

## 2020-12-23 DIAGNOSIS — I4891 Unspecified atrial fibrillation: Secondary | ICD-10-CM | POA: Diagnosis not present

## 2020-12-23 DIAGNOSIS — C9000 Multiple myeloma not having achieved remission: Secondary | ICD-10-CM

## 2020-12-23 DIAGNOSIS — Z7982 Long term (current) use of aspirin: Secondary | ICD-10-CM | POA: Diagnosis not present

## 2020-12-23 LAB — COMPREHENSIVE METABOLIC PANEL
ALT: 10 U/L (ref 0–44)
AST: 13 U/L — ABNORMAL LOW (ref 15–41)
Albumin: 3.9 g/dL (ref 3.5–5.0)
Alkaline Phosphatase: 39 U/L (ref 38–126)
Anion gap: 5 (ref 5–15)
BUN: 19 mg/dL (ref 8–23)
CO2: 31 mmol/L (ref 22–32)
Calcium: 9.4 mg/dL (ref 8.9–10.3)
Chloride: 106 mmol/L (ref 98–111)
Creatinine, Ser: 1.48 mg/dL — ABNORMAL HIGH (ref 0.61–1.24)
GFR, Estimated: 46 mL/min — ABNORMAL LOW (ref >60)
Glucose, Bld: 104 mg/dL — ABNORMAL HIGH (ref 70–99)
Potassium: 5.2 mmol/L — ABNORMAL HIGH (ref 3.5–5.1)
Sodium: 142 mmol/L (ref 135–145)
Total Bilirubin: 0.9 mg/dL (ref 0.3–1.2)
Total Protein: 6.2 g/dL — ABNORMAL LOW (ref 6.5–8.1)

## 2020-12-23 LAB — CBC WITH DIFFERENTIAL (CANCER CENTER ONLY)
Abs Immature Granulocytes: 0.06 10*3/uL (ref 0.00–0.07)
Basophils Absolute: 0.1 10*3/uL (ref 0.0–0.1)
Basophils Relative: 1 %
Eosinophils Absolute: 0.3 10*3/uL (ref 0.0–0.5)
Eosinophils Relative: 3 %
HCT: 42.8 % (ref 39.0–52.0)
Hemoglobin: 13.8 g/dL (ref 13.0–17.0)
Immature Granulocytes: 1 %
Lymphocytes Relative: 18 %
Lymphs Abs: 2 10*3/uL (ref 0.7–4.0)
MCH: 31.7 pg (ref 26.0–34.0)
MCHC: 32.2 g/dL (ref 30.0–36.0)
MCV: 98.2 fL (ref 80.0–100.0)
Monocytes Absolute: 1 10*3/uL (ref 0.1–1.0)
Monocytes Relative: 9 %
Neutro Abs: 8 10*3/uL — ABNORMAL HIGH (ref 1.7–7.7)
Neutrophils Relative %: 68 %
Platelet Count: 240 10*3/uL (ref 150–400)
RBC: 4.36 MIL/uL (ref 4.22–5.81)
RDW: 12.5 % (ref 11.5–15.5)
WBC Count: 11.4 10*3/uL — ABNORMAL HIGH (ref 4.0–10.5)
nRBC: 0 % (ref 0.0–0.2)

## 2020-12-23 MED ORDER — DENOSUMAB 120 MG/1.7ML ~~LOC~~ SOLN
SUBCUTANEOUS | Status: AC
  Filled 2020-12-23: qty 1.7

## 2020-12-23 MED ORDER — PROCHLORPERAZINE MALEATE 10 MG PO TABS
10.0000 mg | ORAL_TABLET | Freq: Once | ORAL | Status: AC
  Administered 2020-12-23: 10 mg via ORAL

## 2020-12-23 MED ORDER — PROCHLORPERAZINE MALEATE 10 MG PO TABS
ORAL_TABLET | ORAL | Status: AC
  Filled 2020-12-23: qty 1

## 2020-12-23 MED ORDER — DEXAMETHASONE 4 MG PO TABS
20.0000 mg | ORAL_TABLET | ORAL | Status: DC
  Administered 2020-12-23: 20 mg via ORAL

## 2020-12-23 MED ORDER — BORTEZOMIB CHEMO SQ INJECTION 3.5 MG (2.5MG/ML)
1.3000 mg/m2 | Freq: Once | INTRAMUSCULAR | Status: AC
  Administered 2020-12-23: 2.25 mg via SUBCUTANEOUS
  Filled 2020-12-23: qty 0.9

## 2020-12-23 MED ORDER — DEXAMETHASONE 4 MG PO TABS
ORAL_TABLET | ORAL | Status: AC
  Filled 2020-12-23: qty 5

## 2020-12-23 MED ORDER — DENOSUMAB 120 MG/1.7ML ~~LOC~~ SOLN
120.0000 mg | Freq: Once | SUBCUTANEOUS | Status: AC
  Administered 2020-12-23: 120 mg via SUBCUTANEOUS

## 2020-12-23 NOTE — Patient Instructions (Signed)
Bortezomib injection What is this medicine? BORTEZOMIB (bor TEZ oh mib) targets proteins in cancer cells and stops the cancer cells from growing. It treats multiple myeloma and mantle cell lymphoma. This medicine may be used for other purposes; ask your health care provider or pharmacist if you have questions. COMMON BRAND NAME(S): Velcade What should I tell my health care provider before I take this medicine? They need to know if you have any of these conditions:  dehydration  diabetes (high blood sugar)  heart disease  liver disease  tingling of the fingers or toes or other nerve disorder  an unusual or allergic reaction to bortezomib, mannitol, boron, other medicines, foods, dyes, or preservatives  pregnant or trying to get pregnant  breast-feeding How should I use this medicine? This medicine is injected into a vein or under the skin. It is given by a health care provider in a hospital or clinic setting. Talk to your health care provider about the use of this medicine in children. Special care may be needed. Overdosage: If you think you have taken too much of this medicine contact a poison control center or emergency room at once. NOTE: This medicine is only for you. Do not share this medicine with others. What if I miss a dose? Keep appointments for follow-up doses. It is important not to miss your dose. Call your health care provider if you are unable to keep an appointment. What may interact with this medicine? This medicine may interact with the following medications:  ketoconazole  rifampin This list may not describe all possible interactions. Give your health care provider a list of all the medicines, herbs, non-prescription drugs, or dietary supplements you use. Also tell them if you smoke, drink alcohol, or use illegal drugs. Some items may interact with your medicine. What should I watch for while using this medicine? Your condition will be monitored carefully while  you are receiving this medicine. You may need blood work done while you are taking this medicine. You may get drowsy or dizzy. Do not drive, use machinery, or do anything that needs mental alertness until you know how this medicine affects you. Do not stand up or sit up quickly, especially if you are an older patient. This reduces the risk of dizzy or fainting spells This medicine may increase your risk of getting an infection. Call your health care provider for advice if you get a fever, chills, sore throat, or other symptoms of a cold or flu. Do not treat yourself. Try to avoid being around people who are sick. Check with your health care provider if you have severe diarrhea, nausea, and vomiting, or if you sweat a lot. The loss of too much body fluid may make it dangerous for you to take this medicine. Do not become pregnant while taking this medicine or for 7 months after stopping it. Women should inform their health care provider if they wish to become pregnant or think they might be pregnant. Men should not father a child while taking this medicine and for 4 months after stopping it. There is a potential for serious harm to an unborn child. Talk to your health care provider for more information. Do not breast-feed an infant while taking this medicine or for 2 months after stopping it. This medicine may make it more difficult to get pregnant or father a child. Talk to your health care provider if you are concerned about your fertility. What side effects may I notice from receiving this medicine?   Side effects that you should report to your doctor or health care professional as soon as possible:  allergic reactions (skin rash; itching or hives; swelling of the face, lips, or tongue)  bleeding (bloody or black, tarry stools; red or dark brown urine; spitting up blood or brown material that looks like coffee grounds; red spots on the skin; unusual bruising or bleeding from the eye, gums, or  nose)  blurred vision or changes in vision  confusion  constipation  headache  heart failure (trouble breathing; fast, irregular heartbeat; sudden weight gain; swelling of the ankles, feet, hands)  infection (fever, chills, cough, sore throat, pain or trouble passing urine)  lack or loss of appetite  liver injury (dark yellow or brown urine; general ill feeling or flu-like symptoms; loss of appetite, right upper belly pain; yellowing of the eyes or skin)  low blood pressure (dizziness; feeling faint or lightheaded, falls; unusually weak or tired)  muscle cramps  pain, redness, or irritation at site where injected  pain, tingling, numbness in the hands or feet  seizures  trouble breathing  unusual bruising or bleeding Side effects that usually do not require medical attention (report to your doctor or health care professional if they continue or are bothersome):  diarrhea  nausea  stomach pain  trouble sleeping  vomiting This list may not describe all possible side effects. Call your doctor for medical advice about side effects. You may report side effects to FDA at 1-800-FDA-1088. Where should I keep my medicine? This medicine is given in a hospital or clinic. It will not be stored at home. NOTE: This sheet is a summary. It may not cover all possible information. If you have questions about this medicine, talk to your doctor, pharmacist, or health care provider.  2021 Elsevier/Gold Standard (2020-09-01 13:22:53) Denosumab injection What is this medicine? DENOSUMAB (den oh sue mab) slows bone breakdown. Prolia is used to treat osteoporosis in women after menopause and in men, and in people who are taking corticosteroids for 6 months or more. Delton See is used to treat a high calcium level due to cancer and to prevent bone fractures and other bone problems caused by multiple myeloma or cancer bone metastases. Delton See is also used to treat giant cell tumor of the bone. This  medicine may be used for other purposes; ask your health care provider or pharmacist if you have questions. COMMON BRAND NAME(S): Prolia, XGEVA What should I tell my health care provider before I take this medicine? They need to know if you have any of these conditions:  dental disease  having surgery or tooth extraction  infection  kidney disease  low levels of calcium or Vitamin D in the blood  malnutrition  on hemodialysis  skin conditions or sensitivity  thyroid or parathyroid disease  an unusual reaction to denosumab, other medicines, foods, dyes, or preservatives  pregnant or trying to get pregnant  breast-feeding How should I use this medicine? This medicine is for injection under the skin. It is given by a health care professional in a hospital or clinic setting. A special MedGuide will be given to you before each treatment. Be sure to read this information carefully each time. For Prolia, talk to your pediatrician regarding the use of this medicine in children. Special care may be needed. For Delton See, talk to your pediatrician regarding the use of this medicine in children. While this drug may be prescribed for children as young as 13 years for selected conditions, precautions do apply.  Overdosage: If you think you have taken too much of this medicine contact a poison control center or emergency room at once. NOTE: This medicine is only for you. Do not share this medicine with others. What if I miss a dose? It is important not to miss your dose. Call your doctor or health care professional if you are unable to keep an appointment. What may interact with this medicine? Do not take this medicine with any of the following medications:  other medicines containing denosumab This medicine may also interact with the following medications:  medicines that lower your chance of fighting infection  steroid medicines like prednisone or cortisone This list may not describe all  possible interactions. Give your health care provider a list of all the medicines, herbs, non-prescription drugs, or dietary supplements you use. Also tell them if you smoke, drink alcohol, or use illegal drugs. Some items may interact with your medicine. What should I watch for while using this medicine? Visit your doctor or health care professional for regular checks on your progress. Your doctor or health care professional may order blood tests and other tests to see how you are doing. Call your doctor or health care professional for advice if you get a fever, chills or sore throat, or other symptoms of a cold or flu. Do not treat yourself. This drug may decrease your body's ability to fight infection. Try to avoid being around people who are sick. You should make sure you get enough calcium and vitamin D while you are taking this medicine, unless your doctor tells you not to. Discuss the foods you eat and the vitamins you take with your health care professional. See your dentist regularly. Brush and floss your teeth as directed. Before you have any dental work done, tell your dentist you are receiving this medicine. Do not become pregnant while taking this medicine or for 5 months after stopping it. Talk with your doctor or health care professional about your birth control options while taking this medicine. Women should inform their doctor if they wish to become pregnant or think they might be pregnant. There is a potential for serious side effects to an unborn child. Talk to your health care professional or pharmacist for more information. What side effects may I notice from receiving this medicine? Side effects that you should report to your doctor or health care professional as soon as possible:  allergic reactions like skin rash, itching or hives, swelling of the face, lips, or tongue  bone pain  breathing problems  dizziness  jaw pain, especially after dental work  redness, blistering,  peeling of the skin  signs and symptoms of infection like fever or chills; cough; sore throat; pain or trouble passing urine  signs of low calcium like fast heartbeat, muscle cramps or muscle pain; pain, tingling, numbness in the hands or feet; seizures  unusual bleeding or bruising  unusually weak or tired Side effects that usually do not require medical attention (report to your doctor or health care professional if they continue or are bothersome):  constipation  diarrhea  headache  joint pain  loss of appetite  muscle pain  runny nose  tiredness  upset stomach This list may not describe all possible side effects. Call your doctor for medical advice about side effects. You may report side effects to FDA at 1-800-FDA-1088. Where should I keep my medicine? This medicine is only given in a clinic, doctor's office, or other health care setting and will not  be stored at home. NOTE: This sheet is a summary. It may not cover all possible information. If you have questions about this medicine, talk to your doctor, pharmacist, or health care provider.  2021 Elsevier/Gold Standard (2018-01-17 16:10:44)

## 2020-12-30 ENCOUNTER — Inpatient Hospital Stay: Payer: Medicare Other

## 2020-12-30 ENCOUNTER — Other Ambulatory Visit: Payer: Self-pay

## 2020-12-30 ENCOUNTER — Telehealth: Payer: Self-pay

## 2020-12-30 ENCOUNTER — Encounter: Payer: Self-pay | Admitting: Hematology & Oncology

## 2020-12-30 ENCOUNTER — Inpatient Hospital Stay (HOSPITAL_BASED_OUTPATIENT_CLINIC_OR_DEPARTMENT_OTHER): Payer: Medicare Other | Admitting: Hematology & Oncology

## 2020-12-30 VITALS — BP 147/68 | HR 79 | Temp 98.3°F | Resp 17 | Wt 144.0 lb

## 2020-12-30 DIAGNOSIS — Z5111 Encounter for antineoplastic chemotherapy: Secondary | ICD-10-CM | POA: Diagnosis not present

## 2020-12-30 DIAGNOSIS — C9000 Multiple myeloma not having achieved remission: Secondary | ICD-10-CM

## 2020-12-30 LAB — CBC WITH DIFFERENTIAL (CANCER CENTER ONLY)
Abs Immature Granulocytes: 0.05 10*3/uL (ref 0.00–0.07)
Basophils Absolute: 0 10*3/uL (ref 0.0–0.1)
Basophils Relative: 1 %
Eosinophils Absolute: 0.3 10*3/uL (ref 0.0–0.5)
Eosinophils Relative: 3 %
HCT: 40.1 % (ref 39.0–52.0)
Hemoglobin: 13.1 g/dL (ref 13.0–17.0)
Immature Granulocytes: 1 %
Lymphocytes Relative: 22 %
Lymphs Abs: 1.9 10*3/uL (ref 0.7–4.0)
MCH: 32 pg (ref 26.0–34.0)
MCHC: 32.7 g/dL (ref 30.0–36.0)
MCV: 97.8 fL (ref 80.0–100.0)
Monocytes Absolute: 1 10*3/uL (ref 0.1–1.0)
Monocytes Relative: 12 %
Neutro Abs: 5.3 10*3/uL (ref 1.7–7.7)
Neutrophils Relative %: 61 %
Platelet Count: 176 10*3/uL (ref 150–400)
RBC: 4.1 MIL/uL — ABNORMAL LOW (ref 4.22–5.81)
RDW: 12.7 % (ref 11.5–15.5)
WBC Count: 8.5 10*3/uL (ref 4.0–10.5)
nRBC: 0 % (ref 0.0–0.2)

## 2020-12-30 LAB — CMP (CANCER CENTER ONLY)
ALT: 10 U/L (ref 0–44)
AST: 12 U/L — ABNORMAL LOW (ref 15–41)
Albumin: 3.8 g/dL (ref 3.5–5.0)
Alkaline Phosphatase: 39 U/L (ref 38–126)
Anion gap: 4 — ABNORMAL LOW (ref 5–15)
BUN: 19 mg/dL (ref 8–23)
CO2: 32 mmol/L (ref 22–32)
Calcium: 9.4 mg/dL (ref 8.9–10.3)
Chloride: 103 mmol/L (ref 98–111)
Creatinine: 1.51 mg/dL — ABNORMAL HIGH (ref 0.61–1.24)
GFR, Estimated: 45 mL/min — ABNORMAL LOW (ref >60)
Glucose, Bld: 108 mg/dL — ABNORMAL HIGH (ref 70–99)
Potassium: 4.1 mmol/L (ref 3.5–5.1)
Sodium: 139 mmol/L (ref 135–145)
Total Bilirubin: 0.9 mg/dL (ref 0.3–1.2)
Total Protein: 5.9 g/dL — ABNORMAL LOW (ref 6.5–8.1)

## 2020-12-30 LAB — LACTATE DEHYDROGENASE: LDH: 156 U/L (ref 98–192)

## 2020-12-30 MED ORDER — PROCHLORPERAZINE MALEATE 10 MG PO TABS
ORAL_TABLET | ORAL | Status: AC
  Filled 2020-12-30: qty 1

## 2020-12-30 MED ORDER — PROCHLORPERAZINE MALEATE 10 MG PO TABS
10.0000 mg | ORAL_TABLET | Freq: Once | ORAL | Status: AC
  Administered 2020-12-30: 10 mg via ORAL

## 2020-12-30 MED ORDER — DEXAMETHASONE 4 MG PO TABS
20.0000 mg | ORAL_TABLET | ORAL | Status: DC
  Administered 2020-12-30: 20 mg via ORAL

## 2020-12-30 MED ORDER — BORTEZOMIB CHEMO SQ INJECTION 3.5 MG (2.5MG/ML)
1.3000 mg/m2 | Freq: Once | INTRAMUSCULAR | Status: AC
  Administered 2020-12-30: 2.25 mg via SUBCUTANEOUS
  Filled 2020-12-30: qty 0.9

## 2020-12-30 MED ORDER — DEXAMETHASONE 4 MG PO TABS
ORAL_TABLET | ORAL | Status: AC
  Filled 2020-12-30: qty 5

## 2020-12-30 NOTE — Patient Instructions (Signed)
[facility] Discharge Instructions for Patients Receiving Chemotherapy  Today you received the following chemotherapy agents Velcade  To help prevent nausea and vomiting after your treatment, we encourage you to take your nausea medication as prescribed by MD.   If you develop nausea and vomiting that is not controlled by your nausea medication, call the clinic.   BELOW ARE SYMPTOMS THAT SHOULD BE REPORTED IMMEDIATELY:  *FEVER GREATER THAN 100.5 F  *CHILLS WITH OR WITHOUT FEVER  NAUSEA AND VOMITING THAT IS NOT CONTROLLED WITH YOUR NAUSEA MEDICATION  *UNUSUAL SHORTNESS OF BREATH  *UNUSUAL BRUISING OR BLEEDING  TENDERNESS IN MOUTH AND THROAT WITH OR WITHOUT PRESENCE OF ULCERS  *URINARY PROBLEMS  *BOWEL PROBLEMS  UNUSUAL RASH Items with * indicate a potential emergency and should be followed up as soon as possible.  Feel free to call the clinic should you have any questions or concerns. The clinic phone number is [phone].  Please show the [facility] at check-in to the Emergency Department and triage nurse.

## 2020-12-30 NOTE — Progress Notes (Signed)
Reviewed pt labs with Dr. Marin Olp and pt ok to treat with creatinine 1.51

## 2020-12-30 NOTE — Telephone Encounter (Signed)
appts made per 12/30/20 los and pt will gain appts in tx/avs    Shawn Bauer

## 2020-12-30 NOTE — Progress Notes (Signed)
Hematology and Oncology Follow Up Visit  Bocephus Cali [facility] [id number] 1934/02/28 87 y.o. 12/30/2020   Principle Diagnosis:  IgG Kappa myeloma -- high risk due to t(4:21)  Current Therapy: Velcade/Decadron --started01/15/2021, s/p cycle14 --treatment frequency changed to every other week on 01/21/2021 Xgeva 120 mg IM q 3 months -- next dose on 12/2020   Interim History: [name] is here today for follow-up.  Everything is going quite well for him.  His atrial fibrillation seems to be doing pretty well.  He is seen his cardiologist for this.  So far, his myeloma has responded very nicely.  There has been no monoclonal spike in his blood.  His IgG level was 666 mg/dL and his Kappa light chain was 19.2 mg/dL.  I think that we can think about moving his treatments I will bit longer.  I think we can probably try every other week treatment on him.  He will get treated today and then will not come back until April 30.  I think this is very reasonable.  He has had no fever.  He has had no cough.  He has had no nausea or vomiting.  Has had no tingling in the hands or feet.  Overall, I would say his performance status is ECOG 1.    Medications:  Allergies as of 12/30/2020   No Known Allergies     Medication List       Accurate as of December 30, 2020 10:57 AM. If you have any questions, ask your nurse or doctor.        aspirin EC 81 MG tablet Take 81 mg by mouth daily. Swallow whole.   budesonide-formoterol 160-4.5 MCG/ACT inhaler Commonly known as: SYMBICORT Inhale 2 puffs into the lungs 2 (two) times daily.   CINNAMON PO Take 1,000 mg by mouth daily.   cyanocobalamin 1000 MCG tablet Take 1,000 mcg by mouth daily.   lovastatin 40 MG tablet Commonly known as: MEVACOR Take 80 mg by mouth daily.   MELATONIN ER PO Take by mouth as needed.   metoprolol succinate 50 MG 24 hr tablet Commonly known as: TOPROL-XL Take 25 mg by mouth in the morning and at bedtime.    nitroGLYCERIN 0.4 MG SL tablet Commonly known as: NITROSTAT Place 1 tablet (0.4 mg total) under the tongue every 5 (five) minutes as needed for chest pain.   olmesartan 20 MG tablet Commonly known as: BENICAR Take 20 mg by mouth daily.   traMADol 50 MG tablet Commonly known as: ULTRAM Take 50 mg by mouth in the morning and at bedtime.       Allergies: No Known Allergies  Past Medical History, Surgical history, Social history, and Family History were reviewed and updated.  Review of Systems: Review of Systems  Constitutional: Negative.   HENT: Negative.   Eyes: Negative.   Respiratory: Negative.   Cardiovascular: Positive for palpitations.  Gastrointestinal: Negative.   Genitourinary: Negative.   Musculoskeletal: Negative.   Skin: Negative.   Neurological: Negative.   Endo/Heme/Allergies: Negative.   Psychiatric/Behavioral: Negative.      Physical Exam:  weight is 144 lb (65.3 kg). His oral temperature is 98.3 F (36.8 C). His blood pressure is 147/68 (abnormal) and his pulse is 79. His respiration is 17 and oxygen saturation is 97%.   Wt Readings from Last 3 Encounters:  12/30/20 144 lb (65.3 kg)  12/01/20 144 lb (65.3 kg)  11/03/20 143 lb 6.4 oz (65 kg)    Physical Exam Vitals reviewed.  HENT:     Head: Normocephalic and atraumatic.  Eyes:     Pupils: Pupils are equal, round, and reactive to light.  Cardiovascular:     Rate and Rhythm: Normal rate and regular rhythm.     Heart sounds: Normal heart sounds.  Pulmonary:     Effort: Pulmonary effort is normal.     Breath sounds: Normal breath sounds.  Abdominal:     General: Bowel sounds are normal.     Palpations: Abdomen is soft.  Musculoskeletal:        General: No tenderness or deformity. Normal range of motion.     Cervical back: Normal range of motion.  Lymphadenopathy:     Cervical: No cervical adenopathy.  Skin:    General: Skin is warm and dry.     Findings: No erythema or rash.   Neurological:     Mental Status: He is alert and oriented to person, place, and time.  Psychiatric:        Behavior: Behavior normal.        Thought Content: Thought content normal.        Judgment: Judgment normal.      Lab Results  Component Value Date   WBC 8.5 12/30/2020   HGB 13.1 12/30/2020   HCT 40.1 12/30/2020   MCV 97.8 12/30/2020   PLT 176 12/30/2020   No results found for: FERRITIN, IRON, TIBC, UIBC, IRONPCTSAT Lab Results  Component Value Date   RBC 4.10 (L) 12/30/2020   Lab Results  Component Value Date   KPAFRELGTCHN 191.5 (H) 12/01/2020   LAMBDASER 11.2 12/01/2020   KAPLAMBRATIO 17.10 (H) 12/01/2020   Lab Results  Component Value Date   IGGSERUM 666 12/01/2020   IGA 119 12/01/2020   IGMSERUM 25 12/01/2020   Lab Results  Component Value Date   TOTALPROTELP 5.7 (L) 12/01/2020   ALBUMINELP 3.3 12/01/2020   A1GS 0.2 12/01/2020   A2GS 0.7 12/01/2020   BETS 0.8 12/01/2020   GAMS 0.6 12/01/2020   MSPIKE Not Observed 12/01/2020   SPEI Comment 12/01/2020     Chemistry      Component Value Date/Time   NA 139 12/30/2020 0941   K 4.1 12/30/2020 0941   CL 103 12/30/2020 0941   CO2 32 12/30/2020 0941   BUN 19 12/30/2020 0941   CREATININE 1.51 (H) 12/30/2020 0941      Component Value Date/Time   CALCIUM 9.4 12/30/2020 0941   ALKPHOS 39 12/30/2020 0941   AST 12 (L) 12/30/2020 0941   ALT 10 12/30/2020 0941   BILITOT 0.9 12/30/2020 0941       Impression and Plan: Mr. Walling is a very pleasant 87yo caucasian gentleman with IgG kappa myeloma. So far, everything is going nicely.  Again we will see about changing his treatments to every other week.  I think this would be reasonable and effective.  He will start every other week treatment on April 30.  I will see him back in about 6 weeks.  Think this would be manageable and will certainly accommodate his lifestyle.  [name], MD 4/8/202210:57 AM

## 2020-12-31 LAB — IGG, IGA, IGM
IgA: 117 mg/dL (ref 61–437)
IgG (Immunoglobin G), Serum: 668 mg/dL (ref 603–1613)
IgM (Immunoglobulin M), Srm: 27 mg/dL (ref 15–143)

## 2021-01-02 LAB — PROTEIN ELECTROPHORESIS, SERUM
A/G Ratio: 1.4 (ref 0.7–1.7)
Albumin ELP: 3.3 g/dL (ref 2.9–4.4)
Alpha-1-Globulin: 0.2 g/dL (ref 0.0–0.4)
Alpha-2-Globulin: 0.7 g/dL (ref 0.4–1.0)
Beta Globulin: 0.7 g/dL (ref 0.7–1.3)
Gamma Globulin: 0.6 g/dL (ref 0.4–1.8)
Globulin, Total: 2.3 g/dL (ref 2.2–3.9)
Total Protein ELP: 5.6 g/dL — ABNORMAL LOW (ref 6.0–8.5)

## 2021-01-02 LAB — KAPPA/LAMBDA LIGHT CHAINS
Kappa free light chain: 171.6 mg/L — ABNORMAL HIGH (ref 3.3–19.4)
Kappa, lambda light chain ratio: 19.95 — ABNORMAL HIGH (ref 0.26–1.65)
Lambda free light chains: 8.6 mg/L (ref 5.7–26.3)

## 2021-01-05 ENCOUNTER — Ambulatory Visit (INDEPENDENT_AMBULATORY_CARE_PROVIDER_SITE_OTHER): Payer: Medicare Other | Admitting: Internal Medicine

## 2021-01-05 ENCOUNTER — Other Ambulatory Visit: Payer: Self-pay

## 2021-01-05 ENCOUNTER — Encounter: Payer: Self-pay | Admitting: Internal Medicine

## 2021-01-05 VITALS — BP 130/58 | HR 77 | Ht 71.0 in | Wt 141.8 lb

## 2021-01-05 DIAGNOSIS — I483 Typical atrial flutter: Secondary | ICD-10-CM | POA: Diagnosis not present

## 2021-01-05 NOTE — Progress Notes (Signed)
HPI Shawn Bauer returns today for followup. He is a pleasant 87 yo man with a h/o atrial flutter who underwent catheter ablation several months ago. He has done well in the interim. He denies chest pain, sob or syncope. He does not have palpitations. No edema. No palpitations. No Known Allergies   Current Outpatient Medications  Medication Sig Dispense Refill  . aspirin EC 81 MG tablet Take 81 mg by mouth daily. Swallow whole.    . budesonide-formoterol (SYMBICORT) 160-4.5 MCG/ACT inhaler Inhale 2 puffs into the lungs 2 (two) times daily.    Marland Kitchen CINNAMON PO Take 1,000 mg by mouth daily.    . cyanocobalamin 1000 MCG tablet Take 1,000 mcg by mouth daily.    Marland Kitchen lovastatin (MEVACOR) 40 MG tablet Take 80 mg by mouth daily.     Marland Kitchen MELATONIN ER PO Take by mouth as needed.    . metoprolol succinate (TOPROL-XL) 50 MG 24 hr tablet Take 25 mg by mouth in the morning and at bedtime.     Marland Kitchen olmesartan (BENICAR) 20 MG tablet Take 20 mg by mouth daily.    . traMADol (ULTRAM) 50 MG tablet Take 50 mg by mouth in the morning and at bedtime.     . nitroGLYCERIN (NITROSTAT) 0.4 MG SL tablet Place 1 tablet (0.4 mg total) under the tongue every 5 (five) minutes as needed for chest pain. (Patient not taking: No sig reported) 25 tablet 11   No current facility-administered medications for this visit.     Past Medical History:  Diagnosis Date  . Asthma   . CAD (coronary artery disease)    s/p CABG  . Degenerative disk disease    cervical/spinal stenosis  . Goals of care, counseling/discussion 09/30/2019  . HTN (hypertension)   . Hyperlipidemia   . Kappa light chain myeloma ([place]) 09/30/2019    ROS:   All systems reviewed and negative except as noted in the HPI.   Past Surgical History:  Procedure Laterality Date  . A-FLUTTER ABLATION N/A 05/23/2020   Procedure: A-FLUTTER ABLATION;  Surgeon: Evans Lance, MD;  Location: [facility];  Service: Cardiovascular;  Laterality: N/A;  .  APPENDECTOMY    . BACK SURGERY    . bilateral inguinal hernia repair    . CORONARY ARTERY BYPASS GRAFT  2001   after positive exercise treadmill test.  LIMA-LAD, Radial artery to LCx marginal, SVG to second diagonal, SVG to PDA  . TONSILLECTOMY AND ADENOIDECTOMY       Family History  Problem Relation Age of Onset  . Heart attack Father 53       died  . Heart attack Brother      Social History   Socioeconomic History  . Marital status: Married    Spouse name: Not on file  . Number of children: 3  . Years of education: Not on file  . Highest education level: Not on file  Occupational History  . Occupation: [employer]  Tobacco Use  . Smoking status: Former Smoker    Types: Cigarettes    Quit date: 08/31/1964    Years since quitting: 56.3  . Smokeless tobacco: Former Network engineer  . Vaping Use: Never used  Substance and Sexual Activity  . Alcohol use: No  . Drug use: No  . Sexual activity: Not on file  Other Topics Concern  . Not on file  Social History Narrative  . Not on file   Social Determinants  of Health   Financial Resource Strain: Not on file  Food Insecurity: Not on file  Transportation Needs: Not on file  Physical Activity: Not on file  Stress: Not on file  Social Connections: Not on file  Intimate Partner Violence: Not on file     BP (!) 130/58   Pulse 77   Ht 5\' 11"  (1.803 m)   Wt 141 lb 12.8 oz (64.3 kg)   SpO2 96%   BMI 19.78 kg/m   Physical Exam:  Well appearing 87 yo man, NAD HEENT: Unremarkable Neck:  6 cm JVD, no thyromegally Lymphatics:  No adenopathy Back:  No CVA tenderness Lungs:  Clear with no wheezes HEART:  Regular rate rhythm, no murmurs, no rubs, no clicks Abd:  soft, positive bowel sounds, no organomegally, no rebound, no guarding Ext:  2 plus pulses, no edema, no cyanosis, no clubbing Skin:  No rashes no nodules Neuro:  CN II through XII intact, motor grossly intact  EKG - NSR with rare  PVC's   Assess/Plan: 1. Atrial flutter - he is s/p ablation and maintaining NSR on no AA drug therapy. 2. CAD - he is s/p CABG but denies anginal symptoms.  3. Myeloma - he is fairly stable, followed by Dr. Marin Olp.  Shawn Overlie Gissell Barra,MD

## 2021-01-05 NOTE — Patient Instructions (Signed)

## 2021-01-06 ENCOUNTER — Ambulatory Visit: Payer: Medicare Other

## 2021-01-06 ENCOUNTER — Other Ambulatory Visit: Payer: Medicare Other

## 2021-01-19 ENCOUNTER — Other Ambulatory Visit: Payer: Self-pay | Admitting: *Deleted

## 2021-01-19 DIAGNOSIS — R778 Other specified abnormalities of plasma proteins: Secondary | ICD-10-CM

## 2021-01-19 DIAGNOSIS — C9000 Multiple myeloma not having achieved remission: Secondary | ICD-10-CM

## 2021-01-19 DIAGNOSIS — Z7189 Other specified counseling: Secondary | ICD-10-CM

## 2021-01-20 ENCOUNTER — Other Ambulatory Visit: Payer: Self-pay

## 2021-01-20 ENCOUNTER — Inpatient Hospital Stay: Payer: Medicare Other

## 2021-01-20 VITALS — BP 140/52 | HR 65 | Temp 98.0°F | Resp 17

## 2021-01-20 DIAGNOSIS — C9000 Multiple myeloma not having achieved remission: Secondary | ICD-10-CM

## 2021-01-20 DIAGNOSIS — Z7189 Other specified counseling: Secondary | ICD-10-CM

## 2021-01-20 DIAGNOSIS — R778 Other specified abnormalities of plasma proteins: Secondary | ICD-10-CM

## 2021-01-20 DIAGNOSIS — Z5111 Encounter for antineoplastic chemotherapy: Secondary | ICD-10-CM | POA: Diagnosis not present

## 2021-01-20 LAB — CMP (CANCER CENTER ONLY)
ALT: 10 U/L (ref 0–44)
AST: 12 U/L — ABNORMAL LOW (ref 15–41)
Albumin: 3.9 g/dL (ref 3.5–5.0)
Alkaline Phosphatase: 35 U/L — ABNORMAL LOW (ref 38–126)
Anion gap: 5 (ref 5–15)
BUN: 23 mg/dL (ref 8–23)
CO2: 32 mmol/L (ref 22–32)
Calcium: 9.8 mg/dL (ref 8.9–10.3)
Chloride: 105 mmol/L (ref 98–111)
Creatinine: 1.83 mg/dL — ABNORMAL HIGH (ref 0.61–1.24)
GFR, Estimated: 36 mL/min — ABNORMAL LOW (ref >60)
Glucose, Bld: 115 mg/dL — ABNORMAL HIGH (ref 70–99)
Potassium: 4.6 mmol/L (ref 3.5–5.1)
Sodium: 142 mmol/L (ref 135–145)
Total Bilirubin: 0.8 mg/dL (ref 0.3–1.2)
Total Protein: 6.1 g/dL — ABNORMAL LOW (ref 6.5–8.1)

## 2021-01-20 LAB — CBC WITH DIFFERENTIAL (CANCER CENTER ONLY)
Abs Immature Granulocytes: 0.03 10*3/uL (ref 0.00–0.07)
Basophils Absolute: 0.1 10*3/uL (ref 0.0–0.1)
Basophils Relative: 1 %
Eosinophils Absolute: 0.3 10*3/uL (ref 0.0–0.5)
Eosinophils Relative: 4 %
HCT: 39.9 % (ref 39.0–52.0)
Hemoglobin: 12.8 g/dL — ABNORMAL LOW (ref 13.0–17.0)
Immature Granulocytes: 0 %
Lymphocytes Relative: 28 %
Lymphs Abs: 2 10*3/uL (ref 0.7–4.0)
MCH: 31.5 pg (ref 26.0–34.0)
MCHC: 32.1 g/dL (ref 30.0–36.0)
MCV: 98.3 fL (ref 80.0–100.0)
Monocytes Absolute: 0.8 10*3/uL (ref 0.1–1.0)
Monocytes Relative: 11 %
Neutro Abs: 4 10*3/uL (ref 1.7–7.7)
Neutrophils Relative %: 56 %
Platelet Count: 194 10*3/uL (ref 150–400)
RBC: 4.06 MIL/uL — ABNORMAL LOW (ref 4.22–5.81)
RDW: 12.7 % (ref 11.5–15.5)
WBC Count: 7.1 10*3/uL (ref 4.0–10.5)
nRBC: 0 % (ref 0.0–0.2)

## 2021-01-20 MED ORDER — BORTEZOMIB CHEMO SQ INJECTION 3.5 MG (2.5MG/ML)
1.3000 mg/m2 | Freq: Once | INTRAMUSCULAR | Status: AC
  Administered 2021-01-20: 2.25 mg via SUBCUTANEOUS
  Filled 2021-01-20: qty 0.9

## 2021-01-20 MED ORDER — PROCHLORPERAZINE MALEATE 10 MG PO TABS
ORAL_TABLET | ORAL | Status: AC
  Filled 2021-01-20: qty 1

## 2021-01-20 MED ORDER — DEXAMETHASONE 4 MG PO TABS
20.0000 mg | ORAL_TABLET | ORAL | Status: DC
  Administered 2021-01-20: 20 mg via ORAL

## 2021-01-20 MED ORDER — PROCHLORPERAZINE MALEATE 10 MG PO TABS
10.0000 mg | ORAL_TABLET | Freq: Once | ORAL | Status: AC
  Administered 2021-01-20: 10 mg via ORAL

## 2021-01-20 NOTE — Patient Instructions (Signed)
Bortezomib injection What is this medicine? BORTEZOMIB (bor TEZ oh mib) targets proteins in cancer cells and stops the cancer cells from growing. It treats multiple myeloma and mantle cell lymphoma. This medicine may be used for other purposes; ask your health care provider or pharmacist if you have questions. COMMON BRAND NAME(S): Velcade What should I tell my health care provider before I take this medicine? They need to know if you have any of these conditions:  dehydration  diabetes (high blood sugar)  heart disease  liver disease  tingling of the fingers or toes or other nerve disorder  an unusual or allergic reaction to bortezomib, mannitol, boron, other medicines, foods, dyes, or preservatives  pregnant or trying to get pregnant  breast-feeding How should I use this medicine? This medicine is injected into a vein or under the skin. It is given by a health care provider in a hospital or clinic setting. Talk to your health care provider about the use of this medicine in children. Special care may be needed. Overdosage: If you think you have taken too much of this medicine contact a poison control center or emergency room at once. NOTE: This medicine is only for you. Do not share this medicine with others. What if I miss a dose? Keep appointments for follow-up doses. It is important not to miss your dose. Call your health care provider if you are unable to keep an appointment. What may interact with this medicine? This medicine may interact with the following medications:  ketoconazole  rifampin This list may not describe all possible interactions. Give your health care provider a list of all the medicines, herbs, non-prescription drugs, or dietary supplements you use. Also tell them if you smoke, drink alcohol, or use illegal drugs. Some items may interact with your medicine. What should I watch for while using this medicine? Your condition will be monitored carefully while  you are receiving this medicine. You may need blood work done while you are taking this medicine. You may get drowsy or dizzy. Do not drive, use machinery, or do anything that needs mental alertness until you know how this medicine affects you. Do not stand up or sit up quickly, especially if you are an older patient. This reduces the risk of dizzy or fainting spells This medicine may increase your risk of getting an infection. Call your health care provider for advice if you get a fever, chills, sore throat, or other symptoms of a cold or flu. Do not treat yourself. Try to avoid being around people who are sick. Check with your health care provider if you have severe diarrhea, nausea, and vomiting, or if you sweat a lot. The loss of too much body fluid may make it dangerous for you to take this medicine. Do not become pregnant while taking this medicine or for 7 months after stopping it. Women should inform their health care provider if they wish to become pregnant or think they might be pregnant. Men should not father a child while taking this medicine and for 4 months after stopping it. There is a potential for serious harm to an unborn child. Talk to your health care provider for more information. Do not breast-feed an infant while taking this medicine or for 2 months after stopping it. This medicine may make it more difficult to get pregnant or father a child. Talk to your health care provider if you are concerned about your fertility. What side effects may I notice from receiving this medicine?   Side effects that you should report to your doctor or health care professional as soon as possible:  allergic reactions (skin rash; itching or hives; swelling of the face, lips, or tongue)  bleeding (bloody or black, tarry stools; red or dark brown urine; spitting up blood or brown material that looks like coffee grounds; red spots on the skin; unusual bruising or bleeding from the eye, gums, or  nose)  blurred vision or changes in vision  confusion  constipation  headache  heart failure (trouble breathing; fast, irregular heartbeat; sudden weight gain; swelling of the ankles, feet, hands)  infection (fever, chills, cough, sore throat, pain or trouble passing urine)  lack or loss of appetite  liver injury (dark yellow or brown urine; general ill feeling or flu-like symptoms; loss of appetite, right upper belly pain; yellowing of the eyes or skin)  low blood pressure (dizziness; feeling faint or lightheaded, falls; unusually weak or tired)  muscle cramps  pain, redness, or irritation at site where injected  pain, tingling, numbness in the hands or feet  seizures  trouble breathing  unusual bruising or bleeding Side effects that usually do not require medical attention (report to your doctor or health care professional if they continue or are bothersome):  diarrhea  nausea  stomach pain  trouble sleeping  vomiting This list may not describe all possible side effects. Call your doctor for medical advice about side effects. You may report side effects to FDA at 1-800-FDA-1088. Where should I keep my medicine? This medicine is given in a hospital or clinic. It will not be stored at home. NOTE: This sheet is a summary. It may not cover all possible information. If you have questions about this medicine, talk to your doctor, pharmacist, or health care provider.  2021 Elsevier/Gold Standard (2020-09-01 13:22:53)

## 2021-02-02 ENCOUNTER — Other Ambulatory Visit: Payer: Self-pay | Admitting: *Deleted

## 2021-02-02 DIAGNOSIS — C9000 Multiple myeloma not having achieved remission: Secondary | ICD-10-CM

## 2021-02-03 ENCOUNTER — Inpatient Hospital Stay: Payer: Medicare Other

## 2021-02-03 ENCOUNTER — Telehealth: Payer: Self-pay | Admitting: *Deleted

## 2021-02-03 NOTE — Telephone Encounter (Signed)
Recd call from patient wife stating that patient is not feeling well this morning and wouldn't be able to come for appt today.  Dr Marin Olp ok with patient just coming for next appt on 01/18/2021 and not rescheduling.  Patient notified.

## 2021-02-17 ENCOUNTER — Inpatient Hospital Stay: Payer: Medicare Other | Attending: Family

## 2021-02-17 ENCOUNTER — Encounter: Payer: Self-pay | Admitting: Hematology & Oncology

## 2021-02-17 ENCOUNTER — Inpatient Hospital Stay (HOSPITAL_BASED_OUTPATIENT_CLINIC_OR_DEPARTMENT_OTHER): Payer: Medicare Other | Admitting: Hematology & Oncology

## 2021-02-17 ENCOUNTER — Inpatient Hospital Stay: Payer: Medicare Other

## 2021-02-17 ENCOUNTER — Other Ambulatory Visit: Payer: Self-pay

## 2021-02-17 VITALS — BP 144/67 | HR 77 | Temp 98.7°F | Resp 20 | Wt 141.0 lb

## 2021-02-17 DIAGNOSIS — C9000 Multiple myeloma not having achieved remission: Secondary | ICD-10-CM

## 2021-02-17 DIAGNOSIS — Z79899 Other long term (current) drug therapy: Secondary | ICD-10-CM | POA: Insufficient documentation

## 2021-02-17 DIAGNOSIS — I4891 Unspecified atrial fibrillation: Secondary | ICD-10-CM | POA: Insufficient documentation

## 2021-02-17 DIAGNOSIS — Z5112 Encounter for antineoplastic immunotherapy: Secondary | ICD-10-CM | POA: Diagnosis present

## 2021-02-17 LAB — CBC WITH DIFFERENTIAL (CANCER CENTER ONLY)
Abs Immature Granulocytes: 0.05 10*3/uL (ref 0.00–0.07)
Basophils Absolute: 0.1 10*3/uL (ref 0.0–0.1)
Basophils Relative: 1 %
Eosinophils Absolute: 0.3 10*3/uL (ref 0.0–0.5)
Eosinophils Relative: 4 %
HCT: 41.6 % (ref 39.0–52.0)
Hemoglobin: 13.6 g/dL (ref 13.0–17.0)
Immature Granulocytes: 1 %
Lymphocytes Relative: 24 %
Lymphs Abs: 2.2 10*3/uL (ref 0.7–4.0)
MCH: 31.9 pg (ref 26.0–34.0)
MCHC: 32.7 g/dL (ref 30.0–36.0)
MCV: 97.4 fL (ref 80.0–100.0)
Monocytes Absolute: 1 10*3/uL (ref 0.1–1.0)
Monocytes Relative: 11 %
Neutro Abs: 5.6 10*3/uL (ref 1.7–7.7)
Neutrophils Relative %: 59 %
Platelet Count: 206 10*3/uL (ref 150–400)
RBC: 4.27 MIL/uL (ref 4.22–5.81)
RDW: 12.8 % (ref 11.5–15.5)
WBC Count: 9.3 10*3/uL (ref 4.0–10.5)
nRBC: 0 % (ref 0.0–0.2)

## 2021-02-17 LAB — CMP (CANCER CENTER ONLY)
ALT: 11 U/L (ref 0–44)
AST: 14 U/L — ABNORMAL LOW (ref 15–41)
Albumin: 4.1 g/dL (ref 3.5–5.0)
Alkaline Phosphatase: 41 U/L (ref 38–126)
Anion gap: 7 (ref 5–15)
BUN: 26 mg/dL — ABNORMAL HIGH (ref 8–23)
CO2: 31 mmol/L (ref 22–32)
Calcium: 9.9 mg/dL (ref 8.9–10.3)
Chloride: 103 mmol/L (ref 98–111)
Creatinine: 1.71 mg/dL — ABNORMAL HIGH (ref 0.61–1.24)
GFR, Estimated: 39 mL/min — ABNORMAL LOW (ref >60)
Glucose, Bld: 109 mg/dL — ABNORMAL HIGH (ref 70–99)
Potassium: 4.2 mmol/L (ref 3.5–5.1)
Sodium: 141 mmol/L (ref 135–145)
Total Bilirubin: 0.8 mg/dL (ref 0.3–1.2)
Total Protein: 6.3 g/dL — ABNORMAL LOW (ref 6.5–8.1)

## 2021-02-17 LAB — LACTATE DEHYDROGENASE: LDH: 172 U/L (ref 98–192)

## 2021-02-17 MED ORDER — BORTEZOMIB CHEMO SQ INJECTION 3.5 MG (2.5MG/ML)
1.3000 mg/m2 | Freq: Once | INTRAMUSCULAR | Status: AC
  Administered 2021-02-17: 2.25 mg via SUBCUTANEOUS
  Filled 2021-02-17: qty 0.9

## 2021-02-17 MED ORDER — DEXAMETHASONE 4 MG PO TABS
ORAL_TABLET | ORAL | Status: AC
  Filled 2021-02-17: qty 5

## 2021-02-17 MED ORDER — PROCHLORPERAZINE MALEATE 10 MG PO TABS
ORAL_TABLET | ORAL | Status: AC
  Filled 2021-02-17: qty 1

## 2021-02-17 MED ORDER — DEXAMETHASONE 4 MG PO TABS
20.0000 mg | ORAL_TABLET | ORAL | Status: DC
  Administered 2021-02-17: 20 mg via ORAL

## 2021-02-17 MED ORDER — PROCHLORPERAZINE MALEATE 10 MG PO TABS
10.0000 mg | ORAL_TABLET | Freq: Once | ORAL | Status: AC
Start: 2021-02-17 — End: 2021-02-17
  Administered 2021-02-17: 10 mg via ORAL

## 2021-02-17 NOTE — Progress Notes (Signed)
Reviewed pt labs with Dr. Marin Olp and pt ok to treat with creatinine 1.7
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
## 2021-02-17 NOTE — Patient Instructions (Signed)
[facility] Discharge Instructions for Patients Receiving Chemotherapy  Today you received the following chemotherapy agents Velcade  To help prevent nausea and vomiting after your treatment, we encourage you to take your nausea medication as prescribed by MD.   If you develop nausea and vomiting that is not controlled by your nausea medication, call the clinic.   BELOW ARE SYMPTOMS THAT SHOULD BE REPORTED IMMEDIATELY:  *FEVER GREATER THAN 100.5 F  *CHILLS WITH OR WITHOUT FEVER  NAUSEA AND VOMITING THAT IS NOT CONTROLLED WITH YOUR NAUSEA MEDICATION  *UNUSUAL SHORTNESS OF BREATH  *UNUSUAL BRUISING OR BLEEDING  TENDERNESS IN MOUTH AND THROAT WITH OR WITHOUT PRESENCE OF ULCERS  *URINARY PROBLEMS  *BOWEL PROBLEMS  UNUSUAL RASH Items with * indicate a potential emergency and should be followed up as soon as possible.  Feel free to call the clinic should you have any questions or concerns. The clinic phone number is [phone].  Please show the [facility] at check-in to the Emergency Department and triage nurse.

## 2021-02-18 LAB — IGG, IGA, IGM
IgA: 132 mg/dL (ref 61–437)
IgG (Immunoglobin G), Serum: 758 mg/dL (ref 603–1613)
IgM (Immunoglobulin M), Srm: 25 mg/dL (ref 15–143)

## 2021-02-21 ENCOUNTER — Telehealth: Payer: Self-pay

## 2021-02-21 LAB — PROTEIN ELECTROPHORESIS, SERUM, WITH REFLEX
A/G Ratio: 1.4 (ref 0.7–1.7)
Albumin ELP: 3.5 g/dL (ref 2.9–4.4)
Alpha-1-Globulin: 0.2 g/dL (ref 0.0–0.4)
Alpha-2-Globulin: 0.7 g/dL (ref 0.4–1.0)
Beta Globulin: 0.8 g/dL (ref 0.7–1.3)
Gamma Globulin: 0.7 g/dL (ref 0.4–1.8)
Globulin, Total: 2.5 g/dL (ref 2.2–3.9)
Total Protein ELP: 6 g/dL (ref 6.0–8.5)

## 2021-02-21 LAB — KAPPA/LAMBDA LIGHT CHAINS
Kappa free light chain: 193.9 mg/L — ABNORMAL HIGH (ref 3.3–19.4)
Kappa, lambda light chain ratio: 17.31 — ABNORMAL HIGH (ref 0.26–1.65)
Lambda free light chains: 11.2 mg/L (ref 5.7–26.3)

## 2021-02-21 NOTE — Telephone Encounter (Signed)
Called and left a vm with appts per 02/17/21 los   [employer]

## 2021-02-27 NOTE — Progress Notes (Signed)
Velcade is every 2 weeks. Careplan changed to every 2 weeks per Dr. Antonieta Pert instructions.

## 2021-03-06 ENCOUNTER — Telehealth: Payer: Self-pay

## 2021-03-06 ENCOUNTER — Inpatient Hospital Stay: Payer: Medicare Other

## 2021-03-06 ENCOUNTER — Inpatient Hospital Stay: Payer: Medicare Other | Attending: Family

## 2021-03-06 ENCOUNTER — Encounter: Payer: Self-pay | Admitting: Family

## 2021-03-06 ENCOUNTER — Other Ambulatory Visit: Payer: Self-pay

## 2021-03-06 ENCOUNTER — Inpatient Hospital Stay (HOSPITAL_BASED_OUTPATIENT_CLINIC_OR_DEPARTMENT_OTHER): Payer: Medicare Other | Admitting: Family

## 2021-03-06 VITALS — BP 133/60 | HR 74 | Temp 97.8°F | Resp 19 | Ht 71.0 in | Wt 140.0 lb

## 2021-03-06 DIAGNOSIS — Z5112 Encounter for antineoplastic immunotherapy: Secondary | ICD-10-CM | POA: Diagnosis present

## 2021-03-06 DIAGNOSIS — Z7982 Long term (current) use of aspirin: Secondary | ICD-10-CM | POA: Diagnosis not present

## 2021-03-06 DIAGNOSIS — C9000 Multiple myeloma not having achieved remission: Secondary | ICD-10-CM | POA: Diagnosis present

## 2021-03-06 DIAGNOSIS — Z79899 Other long term (current) drug therapy: Secondary | ICD-10-CM | POA: Insufficient documentation

## 2021-03-06 LAB — CMP (CANCER CENTER ONLY)
ALT: 11 U/L (ref 0–44)
AST: 13 U/L — ABNORMAL LOW (ref 15–41)
Albumin: 3.9 g/dL (ref 3.5–5.0)
Alkaline Phosphatase: 41 U/L (ref 38–126)
Anion gap: 5 (ref 5–15)
BUN: 20 mg/dL (ref 8–23)
CO2: 31 mmol/L (ref 22–32)
Calcium: 9.3 mg/dL (ref 8.9–10.3)
Chloride: 103 mmol/L (ref 98–111)
Creatinine: 1.65 mg/dL — ABNORMAL HIGH (ref 0.61–1.24)
GFR, Estimated: 41 mL/min — ABNORMAL LOW (ref >60)
Glucose, Bld: 101 mg/dL — ABNORMAL HIGH (ref 70–99)
Potassium: 4.2 mmol/L (ref 3.5–5.1)
Sodium: 139 mmol/L (ref 135–145)
Total Bilirubin: 0.9 mg/dL (ref 0.3–1.2)
Total Protein: 5.8 g/dL — ABNORMAL LOW (ref 6.5–8.1)

## 2021-03-06 LAB — CBC WITH DIFFERENTIAL (CANCER CENTER ONLY)
Abs Immature Granulocytes: 0.04 10*3/uL (ref 0.00–0.07)
Basophils Absolute: 0.1 10*3/uL (ref 0.0–0.1)
Basophils Relative: 1 %
Eosinophils Absolute: 0.3 10*3/uL (ref 0.0–0.5)
Eosinophils Relative: 3 %
HCT: 41.2 % (ref 39.0–52.0)
Hemoglobin: 13.3 g/dL (ref 13.0–17.0)
Immature Granulocytes: 0 %
Lymphocytes Relative: 22 %
Lymphs Abs: 2 10*3/uL (ref 0.7–4.0)
MCH: 31.4 pg (ref 26.0–34.0)
MCHC: 32.3 g/dL (ref 30.0–36.0)
MCV: 97.4 fL (ref 80.0–100.0)
Monocytes Absolute: 0.9 10*3/uL (ref 0.1–1.0)
Monocytes Relative: 10 %
Neutro Abs: 5.7 10*3/uL (ref 1.7–7.7)
Neutrophils Relative %: 64 %
Platelet Count: 196 10*3/uL (ref 150–400)
RBC: 4.23 MIL/uL (ref 4.22–5.81)
RDW: 12.9 % (ref 11.5–15.5)
WBC Count: 9 10*3/uL (ref 4.0–10.5)
nRBC: 0 % (ref 0.0–0.2)

## 2021-03-06 LAB — LACTATE DEHYDROGENASE: LDH: 168 U/L (ref 98–192)

## 2021-03-06 MED ORDER — PROCHLORPERAZINE MALEATE 10 MG PO TABS
10.0000 mg | ORAL_TABLET | Freq: Once | ORAL | Status: AC
Start: 2021-03-06 — End: 2021-03-06
  Administered 2021-03-06: 10 mg via ORAL

## 2021-03-06 MED ORDER — DEXAMETHASONE 4 MG PO TABS
20.0000 mg | ORAL_TABLET | ORAL | Status: DC
  Administered 2021-03-06: 20 mg via ORAL

## 2021-03-06 MED ORDER — DEXAMETHASONE 4 MG PO TABS
ORAL_TABLET | ORAL | Status: AC
  Filled 2021-03-06: qty 5

## 2021-03-06 MED ORDER — BORTEZOMIB CHEMO SQ INJECTION 3.5 MG (2.5MG/ML)
1.3000 mg/m2 | Freq: Once | INTRAMUSCULAR | Status: AC
  Administered 2021-03-06: 2.25 mg via SUBCUTANEOUS
  Filled 2021-03-06: qty 0.9

## 2021-03-06 MED ORDER — PROCHLORPERAZINE MALEATE 10 MG PO TABS
ORAL_TABLET | ORAL | Status: AC
  Filled 2021-03-06: qty 1

## 2021-03-06 NOTE — Patient Instructions (Signed)
[facility] [place]  Discharge Instructions: Thank you for choosing [facility] to provide your oncology and hematology care.   If you have a lab appointment with the [facility], please go directly to the [facility] and check in at the registration area.  Wear comfortable clothing and clothing appropriate for easy access to any Portacath or PICC line.   We strive to give you quality time with your provider. You may need to reschedule your appointment if you arrive late (15 or more minutes).  Arriving late affects you and other patients whose appointments are after yours.  Also, if you miss three or more appointments without notifying the office, you may be dismissed from the clinic at the provider's discretion.      For prescription refill requests, have your pharmacy contact our office and allow 72 hours for refills to be completed.    Today you received the following chemotherapy and/or immunotherapy agents velcade    To help prevent nausea and vomiting after your treatment, we encourage you to take your nausea medication as directed.  BELOW ARE SYMPTOMS THAT SHOULD BE REPORTED IMMEDIATELY: *FEVER GREATER THAN 100.4 F (38 C) OR HIGHER *CHILLS OR SWEATING *NAUSEA AND VOMITING THAT IS NOT CONTROLLED WITH YOUR NAUSEA MEDICATION *UNUSUAL SHORTNESS OF BREATH *UNUSUAL BRUISING OR BLEEDING *URINARY PROBLEMS (pain or burning when urinating, or frequent urination) *BOWEL PROBLEMS (unusual diarrhea, constipation, pain near the anus) TENDERNESS IN MOUTH AND THROAT WITH OR WITHOUT PRESENCE OF ULCERS (sore throat, sores in mouth, or a toothache) UNUSUAL RASH, SWELLING OR PAIN  UNUSUAL VAGINAL DISCHARGE OR ITCHING   Items with * indicate a potential emergency and should be followed up as soon as possible or go to the Emergency Department if any problems should occur.  Please show the CHEMOTHERAPY ALERT CARD or IMMUNOTHERAPY ALERT CARD at check-in to the  Emergency Department and triage nurse. Should you have questions after your visit or need to cancel or reschedule your appointment, please contact [facility]  [phone] and follow the prompts.  Office hours are 8:00 a.m. to 4:30 p.m. Monday - Friday. Please note that voicemails left after 4:00 p.m. may not be returned until the following business day.  We are closed weekends and major holidays. You have access to a nurse at all times for urgent questions. Please call the main number to the clinic [phone] and follow the prompts.  For any non-urgent questions, you may also contact your provider using MyChart. We now offer e-Visits for anyone 39 and older to request care online for non-urgent symptoms. For details visit [email or web address].   Also download the MyChart app! Go to the app store, search "MyChart", open the app, select [facility], and log in with your MyChart username and password.  Due to Covid, a mask is required upon entering the hospital/clinic. If you do not have a mask, one will be given to you upon arrival. For doctor visits, patients may have 1 support person aged 40 or older with them. For treatment visits, patients cannot have anyone with them due to current Covid guidelines and our immunocompromised population.

## 2021-03-06 NOTE — Progress Notes (Signed)
Hematology and Oncology Follow Up Visit  Shawn Bauer [facility] [id number] 1933/12/19 87 y.o. 03/06/2021   Principle Diagnosis:  IgG Kappa myeloma -- high risk due to t(4:21)   Current Therapy:        Velcade/Decadron -- started 10/09/2019, s/p cycle 16 --treatment frequency changed to every other week on 01/21/2021 Xgeva 120 mg IM q 3 months -- next dose on 12/2020   Interim History:  Shawn Bauer is here today for follow-up and treatment. He is doing well but trying to stay out of the intense summer heat.  May protein studies showed no M-spike, IgG level 758 mg/dL and kappa light chains 19.39 mg/dL.  No fever, chills, n/v, cough, rash, dizziness, chest pain, palpitations, abdominal pain or changes in bowel or bladder habits.  No blood loss noted. No abnormal bruising, no petechiae.  No swelling, tenderness, numbness or tingling in his extremities at this times.  No falls or syncope to report.  He has maintained a good appetite and is doing his best to stay well hydrated. His weight is stable at 140 lbs.   ECOG Performance Status: 1 - Symptomatic but completely ambulatory  Medications:  Allergies as of 03/06/2021   No Known Allergies      Medication List        Accurate as of March 06, 2021 10:30 AM. If you have any questions, ask your nurse or doctor.          aspirin EC 81 MG tablet Take 81 mg by mouth daily. Swallow whole.   budesonide-formoterol 160-4.5 MCG/ACT inhaler Commonly known as: SYMBICORT Inhale 2 puffs into the lungs 2 (two) times daily.   CINNAMON PO Take 1,000 mg by mouth daily.   cyanocobalamin 1000 MCG tablet Take 1,000 mcg by mouth daily.   lovastatin 40 MG tablet Commonly known as: MEVACOR Take 80 mg by mouth daily.   MELATONIN ER PO Take by mouth as needed.   metoprolol succinate 50 MG 24 hr tablet Commonly known as: TOPROL-XL Take 25 mg by mouth in the morning and at bedtime.   nitroGLYCERIN 0.4 MG SL tablet Commonly known as:  NITROSTAT Place 1 tablet (0.4 mg total) under the tongue every 5 (five) minutes as needed for chest pain.   olmesartan 20 MG tablet Commonly known as: BENICAR Take 20 mg by mouth daily.   traMADol 50 MG tablet Commonly known as: ULTRAM Take 50 mg by mouth in the morning and at bedtime.        Allergies: No Known Allergies  Past Medical History, Surgical history, Social history, and Family History were reviewed and updated.  Review of Systems: All other 10 point review of systems is negative.   Physical Exam:  vitals were not taken for this visit.   Wt Readings from Last 3 Encounters:  02/17/21 141 lb 0.6 oz (64 kg)  01/05/21 141 lb 12.8 oz (64.3 kg)  12/30/20 144 lb (65.3 kg)    Ocular: Sclerae unicteric, pupils equal, round and reactive to light Ear-nose-throat: Oropharynx clear, dentition fair Lymphatic: No cervical or supraclavicular adenopathy Lungs no rales or rhonchi, good excursion bilaterally Heart regular rate and rhythm, no murmur appreciated Abd soft, nontender, positive bowel sounds MSK no focal spinal tenderness, no joint edema Neuro: non-focal, well-oriented, appropriate affect Breasts: Deferred   Lab Results  Component Value Date   WBC 9.0 03/06/2021   HGB 13.3 03/06/2021   HCT 41.2 03/06/2021   MCV 97.4 03/06/2021   PLT 196 03/06/2021   No  results found for: FERRITIN, IRON, TIBC, UIBC, IRONPCTSAT Lab Results  Component Value Date   RBC 4.23 03/06/2021   Lab Results  Component Value Date   KPAFRELGTCHN 193.9 (H) 02/17/2021   LAMBDASER 11.2 02/17/2021   KAPLAMBRATIO 17.31 (H) 02/17/2021   Lab Results  Component Value Date   IGGSERUM 758 02/17/2021   IGA 132 02/17/2021   IGMSERUM 25 02/17/2021   Lab Results  Component Value Date   TOTALPROTELP 6.0 02/17/2021   ALBUMINELP 3.5 02/17/2021   A1GS 0.2 02/17/2021   A2GS 0.7 02/17/2021   BETS 0.8 02/17/2021   GAMS 0.7 02/17/2021   MSPIKE Not Observed 02/17/2021   SPEI Comment 12/30/2020      Chemistry      Component Value Date/Time   NA 141 02/17/2021 0937   K 4.2 02/17/2021 0937   CL 103 02/17/2021 0937   CO2 31 02/17/2021 0937   BUN 26 (H) 02/17/2021 0937   CREATININE 1.71 (H) 02/17/2021 0937      Component Value Date/Time   CALCIUM 9.9 02/17/2021 0937   ALKPHOS 41 02/17/2021 0937   AST 14 (L) 02/17/2021 0937   ALT 11 02/17/2021 0937   BILITOT 0.8 02/17/2021 0937       Impression and Plan: Shawn Bauer is a very pleasant 87 yo caucasian gentleman with IgG kappa myeloma.  He is tolerating Velcade well and his counts have remained stable so far.  We will proceed with treatment today as planned. Lab and treatment every 2 weeks and follow-up in 4 weeks.  He can contact our office with any questions or concerns.   Laverna Peace, NP 6/13/202210:30 AM

## 2021-03-06 NOTE — Addendum Note (Signed)
Addended by: Burney Gauze R on: 03/06/2021 11:25 AM   Modules accepted: Orders

## 2021-03-06 NOTE — Progress Notes (Signed)
Ok to treat with Creatinine 1.7 per Dr Marin Olp

## 2021-03-06 NOTE — Telephone Encounter (Signed)
Appts made per 03/06/21 and pt will gain sch in chemo/avs   Shawn Bauer

## 2021-03-07 LAB — PROTEIN ELECTROPHORESIS, SERUM, WITH REFLEX
A/G Ratio: 1.5 (ref 0.7–1.7)
Albumin ELP: 3.4 g/dL (ref 2.9–4.4)
Alpha-1-Globulin: 0.2 g/dL (ref 0.0–0.4)
Alpha-2-Globulin: 0.6 g/dL (ref 0.4–1.0)
Beta Globulin: 0.8 g/dL (ref 0.7–1.3)
Gamma Globulin: 0.6 g/dL (ref 0.4–1.8)
Globulin, Total: 2.3 g/dL (ref 2.2–3.9)
Total Protein ELP: 5.7 g/dL — ABNORMAL LOW (ref 6.0–8.5)

## 2021-03-07 LAB — IGG, IGA, IGM
IgA: 123 mg/dL (ref 61–437)
IgG (Immunoglobin G), Serum: 708 mg/dL (ref 603–1613)
IgM (Immunoglobulin M), Srm: 25 mg/dL (ref 15–143)

## 2021-03-07 LAB — KAPPA/LAMBDA LIGHT CHAINS
Kappa free light chain: 203.4 mg/L — ABNORMAL HIGH (ref 3.3–19.4)
Kappa, lambda light chain ratio: 20.34 — ABNORMAL HIGH (ref 0.26–1.65)
Lambda free light chains: 10 mg/L (ref 5.7–26.3)

## 2021-03-20 ENCOUNTER — Ambulatory Visit: Payer: Medicare Other

## 2021-03-20 ENCOUNTER — Other Ambulatory Visit: Payer: Medicare Other

## 2021-04-03 ENCOUNTER — Inpatient Hospital Stay: Payer: Medicare Other

## 2021-04-03 ENCOUNTER — Other Ambulatory Visit: Payer: Self-pay

## 2021-04-03 ENCOUNTER — Encounter: Payer: Self-pay | Admitting: Family

## 2021-04-03 ENCOUNTER — Inpatient Hospital Stay (HOSPITAL_BASED_OUTPATIENT_CLINIC_OR_DEPARTMENT_OTHER): Payer: Medicare Other | Admitting: Family

## 2021-04-03 ENCOUNTER — Inpatient Hospital Stay: Payer: Medicare Other | Attending: Family

## 2021-04-03 VITALS — BP 133/59 | HR 76 | Temp 98.3°F | Resp 18 | Ht 71.0 in | Wt 140.8 lb

## 2021-04-03 DIAGNOSIS — Z5112 Encounter for antineoplastic immunotherapy: Secondary | ICD-10-CM | POA: Insufficient documentation

## 2021-04-03 DIAGNOSIS — C9000 Multiple myeloma not having achieved remission: Secondary | ICD-10-CM

## 2021-04-03 DIAGNOSIS — Z79899 Other long term (current) drug therapy: Secondary | ICD-10-CM | POA: Insufficient documentation

## 2021-04-03 DIAGNOSIS — Z7982 Long term (current) use of aspirin: Secondary | ICD-10-CM | POA: Insufficient documentation

## 2021-04-03 LAB — CBC WITH DIFFERENTIAL (CANCER CENTER ONLY)
Abs Immature Granulocytes: 0.04 10*3/uL (ref 0.00–0.07)
Basophils Absolute: 0.1 10*3/uL (ref 0.0–0.1)
Basophils Relative: 1 %
Eosinophils Absolute: 0.3 10*3/uL (ref 0.0–0.5)
Eosinophils Relative: 4 %
HCT: 41.1 % (ref 39.0–52.0)
Hemoglobin: 13.3 g/dL (ref 13.0–17.0)
Immature Granulocytes: 1 %
Lymphocytes Relative: 25 %
Lymphs Abs: 2.2 10*3/uL (ref 0.7–4.0)
MCH: 32.1 pg (ref 26.0–34.0)
MCHC: 32.4 g/dL (ref 30.0–36.0)
MCV: 99.3 fL (ref 80.0–100.0)
Monocytes Absolute: 1 10*3/uL (ref 0.1–1.0)
Monocytes Relative: 11 %
Neutro Abs: 5.2 10*3/uL (ref 1.7–7.7)
Neutrophils Relative %: 58 %
Platelet Count: 213 10*3/uL (ref 150–400)
RBC: 4.14 MIL/uL — ABNORMAL LOW (ref 4.22–5.81)
RDW: 12.6 % (ref 11.5–15.5)
WBC Count: 8.8 10*3/uL (ref 4.0–10.5)
nRBC: 0 % (ref 0.0–0.2)

## 2021-04-03 LAB — LACTATE DEHYDROGENASE: LDH: 151 U/L (ref 98–192)

## 2021-04-03 LAB — CMP (CANCER CENTER ONLY)
ALT: 13 U/L (ref 0–44)
AST: 13 U/L — ABNORMAL LOW (ref 15–41)
Albumin: 4 g/dL (ref 3.5–5.0)
Alkaline Phosphatase: 51 U/L (ref 38–126)
Anion gap: 6 (ref 5–15)
BUN: 20 mg/dL (ref 8–23)
CO2: 31 mmol/L (ref 22–32)
Calcium: 9.4 mg/dL (ref 8.9–10.3)
Chloride: 102 mmol/L (ref 98–111)
Creatinine: 1.61 mg/dL — ABNORMAL HIGH (ref 0.61–1.24)
GFR, Estimated: 42 mL/min — ABNORMAL LOW (ref >60)
Glucose, Bld: 117 mg/dL — ABNORMAL HIGH (ref 70–99)
Potassium: 4.3 mmol/L (ref 3.5–5.1)
Sodium: 139 mmol/L (ref 135–145)
Total Bilirubin: 0.6 mg/dL (ref 0.3–1.2)
Total Protein: 5.9 g/dL — ABNORMAL LOW (ref 6.5–8.1)

## 2021-04-03 MED ORDER — PROCHLORPERAZINE MALEATE 10 MG PO TABS
10.0000 mg | ORAL_TABLET | Freq: Once | ORAL | Status: AC
Start: 2021-04-03 — End: 2021-04-03
  Administered 2021-04-03: 10 mg via ORAL

## 2021-04-03 MED ORDER — DEXAMETHASONE 4 MG PO TABS
20.0000 mg | ORAL_TABLET | ORAL | Status: DC
  Administered 2021-04-03: 20 mg via ORAL

## 2021-04-03 MED ORDER — PROCHLORPERAZINE MALEATE 10 MG PO TABS
ORAL_TABLET | ORAL | Status: AC
  Filled 2021-04-03: qty 1

## 2021-04-03 MED ORDER — BORTEZOMIB CHEMO SQ INJECTION 3.5 MG (2.5MG/ML)
1.3000 mg/m2 | Freq: Once | INTRAMUSCULAR | Status: AC
  Administered 2021-04-03: 2.25 mg via SUBCUTANEOUS
  Filled 2021-04-03: qty 0.9

## 2021-04-03 MED ORDER — DENOSUMAB 120 MG/1.7ML ~~LOC~~ SOLN
SUBCUTANEOUS | Status: AC
  Filled 2021-04-03: qty 1.7

## 2021-04-03 MED ORDER — DEXAMETHASONE 4 MG PO TABS
ORAL_TABLET | ORAL | Status: AC
  Filled 2021-04-03: qty 5

## 2021-04-03 MED ORDER — DENOSUMAB 120 MG/1.7ML ~~LOC~~ SOLN
120.0000 mg | Freq: Once | SUBCUTANEOUS | Status: AC
  Administered 2021-04-03: 120 mg via SUBCUTANEOUS

## 2021-04-03 NOTE — Patient Instructions (Signed)
Denosumab injection What is this medication? DENOSUMAB (den oh sue mab) slows bone breakdown. Prolia is used to treat osteoporosis in women after menopause and in men, and in people who are taking corticosteroids for 6 months or more. Delton See is used to treat a high calcium level due to cancer and to prevent bone fractures and other bone problems caused by multiple myeloma or cancer bone metastases. Delton See is also used totreat giant cell tumor of the bone. This medicine may be used for other purposes; ask your health care provider orpharmacist if you have questions. COMMON BRAND NAME(S): Prolia, XGEVA What should I tell my care team before I take this medication? They need to know if you have any of these conditions: dental disease having surgery or tooth extraction infection kidney disease low levels of calcium or Vitamin D in the blood malnutrition on hemodialysis skin conditions or sensitivity thyroid or parathyroid disease an unusual reaction to denosumab, other medicines, foods, dyes, or preservatives pregnant or trying to get pregnant breast-feeding How should I use this medication? This medicine is for injection under the skin. It is given by a health careprofessional in a hospital or clinic setting. A special MedGuide will be given to you before each treatment. Be sure to readthis information carefully each time. For Prolia, talk to your pediatrician regarding the use of this medicine in children. Special care may be needed. For Delton See, talk to your pediatrician regarding the use of this medicine in children. While this drug may be prescribed for children as young as 13 years for selected conditions,precautions do apply. Overdosage: If you think you have taken too much of this medicine contact apoison control center or emergency room at once. NOTE: This medicine is only for you. Do not share this medicine with others. What if I miss a dose? It is important not to miss your dose. Call  your doctor or health careprofessional if you are unable to keep an appointment. What may interact with this medication? Do not take this medicine with any of the following medications: other medicines containing denosumab This medicine may also interact with the following medications: medicines that lower your chance of fighting infection steroid medicines like prednisone or cortisone This list may not describe all possible interactions. Give your health care provider a list of all the medicines, herbs, non-prescription drugs, or dietary supplements you use. Also tell them if you smoke, drink alcohol, or use illegaldrugs. Some items may interact with your medicine. What should I watch for while using this medication? Visit your doctor or health care professional for regular checks on your progress. Your doctor or health care professional may order blood tests andother tests to see how you are doing. Call your doctor or health care professional for advice if you get a fever, chills or sore throat, or other symptoms of a cold or flu. Do not treat yourself. This drug may decrease your body's ability to fight infection. Try toavoid being around people who are sick. You should make sure you get enough calcium and vitamin D while you are taking this medicine, unless your doctor tells you not to. Discuss the foods you eatand the vitamins you take with your health care professional. See your dentist regularly. Brush and floss your teeth as directed. Before youhave any dental work done, tell your dentist you are receiving this medicine. Do not become pregnant while taking this medicine or for 5 months after stopping it. Talk with your doctor or health care professional about your  birth control options while taking this medicine. Women should inform their doctor if they wish to become pregnant or think they might be pregnant. There is a potential for serious side effects to an unborn child. Talk to your health  careprofessional or pharmacist for more information. What side effects may I notice from receiving this medication? Side effects that you should report to your doctor or health care professionalas soon as possible: allergic reactions like skin rash, itching or hives, swelling of the face, lips, or tongue bone pain breathing problems dizziness jaw pain, especially after dental work redness, blistering, peeling of the skin signs and symptoms of infection like fever or chills; cough; sore throat; pain or trouble passing urine signs of low calcium like fast heartbeat, muscle cramps or muscle pain; pain, tingling, numbness in the hands or feet; seizures unusual bleeding or bruising unusually weak or tired Side effects that usually do not require medical attention (report to yourdoctor or health care professional if they continue or are bothersome): constipation diarrhea headache joint pain loss of appetite muscle pain runny nose tiredness upset stomach This list may not describe all possible side effects. Call your doctor for medical advice about side effects. You may report side effects to FDA at1-800-FDA-1088. Where should I keep my medication? This medicine is only given in a clinic, doctor's office, or other health caresetting and will not be stored at home. NOTE: This sheet is a summary. It may not cover all possible information. If you have questions about this medicine, talk to your doctor, pharmacist, orhealth care provider.  2022 Elsevier/Gold Standard (2018-01-17 16:10:44)

## 2021-04-03 NOTE — Patient Instructions (Signed)
[facility] [place]  Discharge Instructions: Thank you for choosing [facility] to provide your oncology and hematology care.   If you have a lab appointment with the [facility], please go directly to the [facility] and check in at the registration area.  Wear comfortable clothing and clothing appropriate for easy access to any Portacath or PICC line.   We strive to give you quality time with your provider. You may need to reschedule your appointment if you arrive late (15 or more minutes).  Arriving late affects you and other patients whose appointments are after yours.  Also, if you miss three or more appointments without notifying the office, you may be dismissed from the clinic at the provider's discretion.      For prescription refill requests, have your pharmacy contact our office and allow 72 hours for refills to be completed.    Today you received the following chemotherapy and/or immunotherapy agents velcade    To help prevent nausea and vomiting after your treatment, we encourage you to take your nausea medication as directed.  BELOW ARE SYMPTOMS THAT SHOULD BE REPORTED IMMEDIATELY: *FEVER GREATER THAN 100.4 F (38 C) OR HIGHER *CHILLS OR SWEATING *NAUSEA AND VOMITING THAT IS NOT CONTROLLED WITH YOUR NAUSEA MEDICATION *UNUSUAL SHORTNESS OF BREATH *UNUSUAL BRUISING OR BLEEDING *URINARY PROBLEMS (pain or burning when urinating, or frequent urination) *BOWEL PROBLEMS (unusual diarrhea, constipation, pain near the anus) TENDERNESS IN MOUTH AND THROAT WITH OR WITHOUT PRESENCE OF ULCERS (sore throat, sores in mouth, or a toothache) UNUSUAL RASH, SWELLING OR PAIN  UNUSUAL VAGINAL DISCHARGE OR ITCHING   Items with * indicate a potential emergency and should be followed up as soon as possible or go to the Emergency Department if any problems should occur.  Please show the CHEMOTHERAPY ALERT CARD or IMMUNOTHERAPY ALERT CARD at check-in to the  Emergency Department and triage nurse. Should you have questions after your visit or need to cancel or reschedule your appointment, please contact [facility]  [phone] and follow the prompts.  Office hours are 8:00 a.m. to 4:30 p.m. Monday - Friday. Please note that voicemails left after 4:00 p.m. may not be returned until the following business day.  We are closed weekends and major holidays. You have access to a nurse at all times for urgent questions. Please call the main number to the clinic [phone] and follow the prompts.  For any non-urgent questions, you may also contact your provider using MyChart. We now offer e-Visits for anyone 9 and older to request care online for non-urgent symptoms. For details visit [email or web address].   Also download the MyChart app! Go to the app store, search "MyChart", open the app, select [facility], and log in with your MyChart username and password.  Due to Covid, a mask is required upon entering the hospital/clinic. If you do not have a mask, one will be given to you upon arrival. For doctor visits, patients may have 1 support person aged 47 or older with them. For treatment visits, patients cannot have anyone with them due to current Covid guidelines and our immunocompromised population.

## 2021-04-03 NOTE — Progress Notes (Signed)
Hematology and Oncology Follow Up Visit  Zed Wanninger [facility] [id number] Nov 26, 1933 87 y.o. 04/03/2021   Principle Diagnosis:  IgG Kappa myeloma -- high risk due to t(4:21)   Current Therapy:        Velcade/Decadron -- started 10/09/2019, s/p cycle 16 --treatment frequency changed to every 3 weeks on 04/03/2021 Xgeva 120 mg IM q 3 months -- next dose on 06/2021   Interim History:  Mr. Doshi is here today for follow-up and treatment. He is doing well and has no complaints at this time.  In June, M-spike was not observed, IgG level 708 mg/dL and kappa light chains 20.34 mg/dL.  No fever, chills, n/v, cough, rash, dizziness, SOB, chest pain, palpitations, abdominal pain or changes in bowel or bladder habits.  No swelling or tenderness in his extremities. He has occasional positional tingling in his finger tips that resolves with moving around.  No falls or syncope to report.  He has been eating well and doing his best to stay well hydrated. His weight is stable at 140 lbs.   ECOG Performance Status: 1 - Symptomatic but completely ambulatory  Medications:  Allergies as of 04/03/2021   No Known Allergies      Medication List        Accurate as of April 03, 2021 10:11 AM. If you have any questions, ask your nurse or doctor.          aspirin EC 81 MG tablet Take 81 mg by mouth daily. Swallow whole.   budesonide-formoterol 160-4.5 MCG/ACT inhaler Commonly known as: SYMBICORT Inhale 2 puffs into the lungs 2 (two) times daily.   CINNAMON PO Take 1,000 mg by mouth daily.   cyanocobalamin 1000 MCG tablet Take 1,000 mcg by mouth daily.   lovastatin 40 MG tablet Commonly known as: MEVACOR Take 80 mg by mouth daily.   MELATONIN ER PO Take by mouth as needed.   metoprolol succinate 50 MG 24 hr tablet Commonly known as: TOPROL-XL Take 25 mg by mouth in the morning and at bedtime.   nitroGLYCERIN 0.4 MG SL tablet Commonly known as: NITROSTAT Place 1 tablet (0.4 mg total) under  the tongue every 5 (five) minutes as needed for chest pain.   olmesartan 20 MG tablet Commonly known as: BENICAR Take 20 mg by mouth daily.   traMADol 50 MG tablet Commonly known as: ULTRAM Take 50 mg by mouth in the morning and at bedtime.   traMADol 50 MG tablet Commonly known as: ULTRAM Take 1 tablet by mouth 2 (two) times daily as needed.        Allergies: No Known Allergies  Past Medical History, Surgical history, Social history, and Family History were reviewed and updated.  Review of Systems: All other 10 point review of systems is negative.   Physical Exam:  vitals were not taken for this visit.   Wt Readings from Last 3 Encounters:  03/06/21 140 lb (63.5 kg)  02/17/21 141 lb 0.6 oz (64 kg)  01/05/21 141 lb 12.8 oz (64.3 kg)    Ocular: Sclerae unicteric, pupils equal, round and reactive to light Ear-nose-throat: Oropharynx clear, dentition fair Lymphatic: No cervical or supraclavicular adenopathy Lungs no rales or rhonchi, good excursion bilaterally Heart regular rate and rhythm, no murmur appreciated Abd soft, nontender, positive bowel sounds MSK no focal spinal tenderness, no joint edema Neuro: non-focal, well-oriented, appropriate affect Breasts: Deferred   Lab Results  Component Value Date   WBC 8.8 04/03/2021   HGB 13.3 04/03/2021  HCT 41.1 04/03/2021   MCV 99.3 04/03/2021   PLT 213 04/03/2021   No results found for: FERRITIN, IRON, TIBC, UIBC, IRONPCTSAT Lab Results  Component Value Date   RBC 4.14 (L) 04/03/2021   Lab Results  Component Value Date   KPAFRELGTCHN 203.4 (H) 03/06/2021   LAMBDASER 10.0 03/06/2021   KAPLAMBRATIO 20.34 (H) 03/06/2021   Lab Results  Component Value Date   IGGSERUM 708 03/06/2021   IGA 123 03/06/2021   IGMSERUM 25 03/06/2021   Lab Results  Component Value Date   TOTALPROTELP 5.7 (L) 03/06/2021   ALBUMINELP 3.4 03/06/2021   A1GS 0.2 03/06/2021   A2GS 0.6 03/06/2021   BETS 0.8 03/06/2021   GAMS 0.6  03/06/2021   MSPIKE Not Observed 03/06/2021   SPEI Comment 12/30/2020     Chemistry      Component Value Date/Time   NA 139 03/06/2021 0950   K 4.2 03/06/2021 0950   CL 103 03/06/2021 0950   CO2 31 03/06/2021 0950   BUN 20 03/06/2021 0950   CREATININE 1.65 (H) 03/06/2021 0950      Component Value Date/Time   CALCIUM 9.3 03/06/2021 0950   ALKPHOS 41 03/06/2021 0950   AST 13 (L) 03/06/2021 0950   ALT 11 03/06/2021 0950   BILITOT 0.9 03/06/2021 0950       Impression and Plan: Mr. Carreno is a very pleasant 87 yo caucasian gentleman with IgG kappa myeloma.  He received Velcade and Xgeva today.  We will now go to lab and treatment every 3 weeks with follow-up in 6 weeks.  He can contact our office with any questions or concerns.   Laverna Peace, NP 7/11/202210:11 AM

## 2021-04-04 LAB — KAPPA/LAMBDA LIGHT CHAINS
Kappa free light chain: 189.6 mg/L — ABNORMAL HIGH (ref 3.3–19.4)
Kappa, lambda light chain ratio: 18.06 — ABNORMAL HIGH (ref 0.26–1.65)
Lambda free light chains: 10.5 mg/L (ref 5.7–26.3)

## 2021-04-04 LAB — IGG, IGA, IGM
IgA: 127 mg/dL (ref 61–437)
IgG (Immunoglobin G), Serum: 682 mg/dL (ref 603–1613)
IgM (Immunoglobulin M), Srm: 27 mg/dL (ref 15–143)

## 2021-04-05 LAB — PROTEIN ELECTROPHORESIS, SERUM
A/G Ratio: 1.5 (ref 0.7–1.7)
Albumin ELP: 3.5 g/dL (ref 2.9–4.4)
Alpha-1-Globulin: 0.2 g/dL (ref 0.0–0.4)
Alpha-2-Globulin: 0.7 g/dL (ref 0.4–1.0)
Beta Globulin: 0.8 g/dL (ref 0.7–1.3)
Gamma Globulin: 0.7 g/dL (ref 0.4–1.8)
Globulin, Total: 2.4 g/dL (ref 2.2–3.9)
Total Protein ELP: 5.9 g/dL — ABNORMAL LOW (ref 6.0–8.5)

## 2021-04-24 ENCOUNTER — Inpatient Hospital Stay: Payer: Medicare Other

## 2021-04-24 ENCOUNTER — Other Ambulatory Visit: Payer: Self-pay

## 2021-04-24 ENCOUNTER — Inpatient Hospital Stay: Payer: Medicare Other | Attending: Family

## 2021-04-24 VITALS — BP 166/82 | HR 73 | Temp 98.6°F | Resp 17

## 2021-04-24 DIAGNOSIS — C9000 Multiple myeloma not having achieved remission: Secondary | ICD-10-CM

## 2021-04-24 DIAGNOSIS — Z5111 Encounter for antineoplastic chemotherapy: Secondary | ICD-10-CM | POA: Insufficient documentation

## 2021-04-24 LAB — CBC WITH DIFFERENTIAL (CANCER CENTER ONLY)
Abs Immature Granulocytes: 0.05 10*3/uL (ref 0.00–0.07)
Basophils Absolute: 0.1 10*3/uL (ref 0.0–0.1)
Basophils Relative: 1 %
Eosinophils Absolute: 0.3 10*3/uL (ref 0.0–0.5)
Eosinophils Relative: 3 %
HCT: 39.2 % (ref 39.0–52.0)
Hemoglobin: 12.9 g/dL — ABNORMAL LOW (ref 13.0–17.0)
Immature Granulocytes: 1 %
Lymphocytes Relative: 27 %
Lymphs Abs: 2.3 10*3/uL (ref 0.7–4.0)
MCH: 32.4 pg (ref 26.0–34.0)
MCHC: 32.9 g/dL (ref 30.0–36.0)
MCV: 98.5 fL (ref 80.0–100.0)
Monocytes Absolute: 1 10*3/uL (ref 0.1–1.0)
Monocytes Relative: 12 %
Neutro Abs: 4.7 10*3/uL (ref 1.7–7.7)
Neutrophils Relative %: 56 %
Platelet Count: 180 10*3/uL (ref 150–400)
RBC: 3.98 MIL/uL — ABNORMAL LOW (ref 4.22–5.81)
RDW: 12.6 % (ref 11.5–15.5)
WBC Count: 8.3 10*3/uL (ref 4.0–10.5)
nRBC: 0 % (ref 0.0–0.2)

## 2021-04-24 LAB — CMP (CANCER CENTER ONLY)
ALT: 14 U/L (ref 0–44)
AST: 15 U/L (ref 15–41)
Albumin: 3.8 g/dL (ref 3.5–5.0)
Alkaline Phosphatase: 41 U/L (ref 38–126)
Anion gap: 5 (ref 5–15)
BUN: 17 mg/dL (ref 8–23)
CO2: 31 mmol/L (ref 22–32)
Calcium: 9.4 mg/dL (ref 8.9–10.3)
Chloride: 105 mmol/L (ref 98–111)
Creatinine: 1.51 mg/dL — ABNORMAL HIGH (ref 0.61–1.24)
GFR, Estimated: 45 mL/min — ABNORMAL LOW (ref >60)
Glucose, Bld: 88 mg/dL (ref 70–99)
Potassium: 4.4 mmol/L (ref 3.5–5.1)
Sodium: 141 mmol/L (ref 135–145)
Total Bilirubin: 0.8 mg/dL (ref 0.3–1.2)
Total Protein: 6.1 g/dL — ABNORMAL LOW (ref 6.5–8.1)

## 2021-04-24 MED ORDER — PROCHLORPERAZINE MALEATE 10 MG PO TABS
10.0000 mg | ORAL_TABLET | Freq: Once | ORAL | Status: AC
  Administered 2021-04-24: 10 mg via ORAL

## 2021-04-24 MED ORDER — BORTEZOMIB CHEMO SQ INJECTION 3.5 MG (2.5MG/ML)
1.3000 mg/m2 | Freq: Once | INTRAMUSCULAR | Status: AC
  Administered 2021-04-24: 2.25 mg via SUBCUTANEOUS
  Filled 2021-04-24: qty 0.9

## 2021-04-24 MED ORDER — DEXAMETHASONE 4 MG PO TABS
ORAL_TABLET | ORAL | Status: AC
  Filled 2021-04-24: qty 5

## 2021-04-24 MED ORDER — PROCHLORPERAZINE MALEATE 10 MG PO TABS
ORAL_TABLET | ORAL | Status: AC
  Filled 2021-04-24: qty 1

## 2021-04-24 MED ORDER — DEXAMETHASONE 4 MG PO TABS
20.0000 mg | ORAL_TABLET | ORAL | Status: DC
  Administered 2021-04-24: 20 mg via ORAL

## 2021-04-24 NOTE — Patient Instructions (Signed)
[place] Discharge Instructions for Patients Receiving Chemotherapy  Today you received the following chemotherapy agents Velcade  To help prevent nausea and vomiting after your treatment, we encourage you to take your nausea medication as prescribed by MD.   If you develop nausea and vomiting that is not controlled by your nausea medication, call the clinic.   BELOW ARE SYMPTOMS THAT SHOULD BE REPORTED IMMEDIATELY: *FEVER GREATER THAN 100.5 F *CHILLS WITH OR WITHOUT FEVER NAUSEA AND VOMITING THAT IS NOT CONTROLLED WITH YOUR NAUSEA MEDICATION *UNUSUAL SHORTNESS OF BREATH *UNUSUAL BRUISING OR BLEEDING TENDERNESS IN MOUTH AND THROAT WITH OR WITHOUT PRESENCE OF ULCERS *URINARY PROBLEMS *BOWEL PROBLEMS UNUSUAL RASH Items with * indicate a potential emergency and should be followed up as soon as possible.  Feel free to call the clinic should you have any questions or concerns. The clinic phone number is [phone].  Please show the [place] at check-in to the Emergency Department and triage nurse.

## 2021-04-24 NOTE — Progress Notes (Signed)
OK to treat today with creat-1.51 per order of Dr. Marin Olp.

## 2021-05-07 ENCOUNTER — Other Ambulatory Visit: Payer: Self-pay

## 2021-05-07 ENCOUNTER — Encounter (HOSPITAL_COMMUNITY): Payer: Self-pay

## 2021-05-07 ENCOUNTER — Inpatient Hospital Stay (HOSPITAL_COMMUNITY)
Admission: EM | Admit: 2021-05-07 | Discharge: 2021-05-09 | DRG: 246 | Disposition: A | Discharge status: Home / Self Care | Payer: Medicare Other | Attending: Cardiology | Admitting: Cardiology

## 2021-05-07 DIAGNOSIS — D6869 Other thrombophilia: Secondary | ICD-10-CM | POA: Diagnosis present

## 2021-05-07 DIAGNOSIS — Z79899 Other long term (current) drug therapy: Secondary | ICD-10-CM

## 2021-05-07 DIAGNOSIS — I25110 Atherosclerotic heart disease of native coronary artery with unstable angina pectoris: Secondary | ICD-10-CM | POA: Diagnosis not present

## 2021-05-07 DIAGNOSIS — I2582 Chronic total occlusion of coronary artery: Secondary | ICD-10-CM | POA: Diagnosis present

## 2021-05-07 DIAGNOSIS — Z79890 Hormone replacement therapy (postmenopausal): Secondary | ICD-10-CM

## 2021-05-07 DIAGNOSIS — I25810 Atherosclerosis of coronary artery bypass graft(s) without angina pectoris: Secondary | ICD-10-CM | POA: Diagnosis present

## 2021-05-07 DIAGNOSIS — I2542 Coronary artery dissection: Secondary | ICD-10-CM | POA: Diagnosis not present

## 2021-05-07 DIAGNOSIS — I9788 Other intraoperative complications of the circulatory system, not elsewhere classified: Secondary | ICD-10-CM | POA: Diagnosis not present

## 2021-05-07 DIAGNOSIS — N183 Chronic kidney disease, stage 3 unspecified: Secondary | ICD-10-CM | POA: Diagnosis not present

## 2021-05-07 DIAGNOSIS — Z955 Presence of coronary angioplasty implant and graft: Secondary | ICD-10-CM

## 2021-05-07 DIAGNOSIS — Z7951 Long term (current) use of inhaled steroids: Secondary | ICD-10-CM

## 2021-05-07 DIAGNOSIS — N1832 Chronic kidney disease, stage 3b: Secondary | ICD-10-CM | POA: Diagnosis present

## 2021-05-07 DIAGNOSIS — Z20822 Contact with and (suspected) exposure to covid-19: Secondary | ICD-10-CM | POA: Diagnosis present

## 2021-05-07 DIAGNOSIS — I4729 Other ventricular tachycardia: Secondary | ICD-10-CM

## 2021-05-07 DIAGNOSIS — I214 Non-ST elevation (NSTEMI) myocardial infarction: Secondary | ICD-10-CM | POA: Diagnosis not present

## 2021-05-07 DIAGNOSIS — I472 Ventricular tachycardia, unspecified: Secondary | ICD-10-CM

## 2021-05-07 DIAGNOSIS — M4802 Spinal stenosis, cervical region: Secondary | ICD-10-CM | POA: Diagnosis present

## 2021-05-07 DIAGNOSIS — Z87891 Personal history of nicotine dependence: Secondary | ICD-10-CM

## 2021-05-07 DIAGNOSIS — I483 Typical atrial flutter: Secondary | ICD-10-CM | POA: Diagnosis present

## 2021-05-07 DIAGNOSIS — J45909 Unspecified asthma, uncomplicated: Secondary | ICD-10-CM | POA: Diagnosis present

## 2021-05-07 DIAGNOSIS — Z7982 Long term (current) use of aspirin: Secondary | ICD-10-CM

## 2021-05-07 DIAGNOSIS — I4891 Unspecified atrial fibrillation: Secondary | ICD-10-CM | POA: Diagnosis present

## 2021-05-07 DIAGNOSIS — Z8249 Family history of ischemic heart disease and other diseases of the circulatory system: Secondary | ICD-10-CM

## 2021-05-07 DIAGNOSIS — C9000 Multiple myeloma not having achieved remission: Secondary | ICD-10-CM | POA: Diagnosis present

## 2021-05-07 DIAGNOSIS — I10 Essential (primary) hypertension: Secondary | ICD-10-CM | POA: Diagnosis not present

## 2021-05-07 DIAGNOSIS — I129 Hypertensive chronic kidney disease with stage 1 through stage 4 chronic kidney disease, or unspecified chronic kidney disease: Secondary | ICD-10-CM | POA: Diagnosis present

## 2021-05-07 DIAGNOSIS — E785 Hyperlipidemia, unspecified: Secondary | ICD-10-CM | POA: Diagnosis present

## 2021-05-07 DIAGNOSIS — I447 Left bundle-branch block, unspecified: Secondary | ICD-10-CM | POA: Diagnosis present

## 2021-05-07 DIAGNOSIS — I2510 Atherosclerotic heart disease of native coronary artery without angina pectoris: Secondary | ICD-10-CM | POA: Diagnosis present

## 2021-05-07 HISTORY — DX: Unspecified atrial fibrillation: I48.91

## 2021-05-07 LAB — CBC WITH DIFFERENTIAL/PLATELET
Abs Immature Granulocytes: 0.06 10*3/uL (ref 0.00–0.07)
Basophils Absolute: 0.1 10*3/uL (ref 0.0–0.1)
Basophils Relative: 1 %
Eosinophils Absolute: 0.2 10*3/uL (ref 0.0–0.5)
Eosinophils Relative: 2 %
HCT: 40.2 % (ref 39.0–52.0)
Hemoglobin: 12.8 g/dL — ABNORMAL LOW (ref 13.0–17.0)
Immature Granulocytes: 1 %
Lymphocytes Relative: 17 %
Lymphs Abs: 1.8 10*3/uL (ref 0.7–4.0)
MCH: 31.4 pg (ref 26.0–34.0)
MCHC: 31.8 g/dL (ref 30.0–36.0)
MCV: 98.8 fL (ref 80.0–100.0)
Monocytes Absolute: 1.1 10*3/uL — ABNORMAL HIGH (ref 0.1–1.0)
Monocytes Relative: 10 %
Neutro Abs: 7.5 10*3/uL (ref 1.7–7.7)
Neutrophils Relative %: 69 %
Platelets: 213 10*3/uL (ref 150–400)
RBC: 4.07 MIL/uL — ABNORMAL LOW (ref 4.22–5.81)
RDW: 12.7 % (ref 11.5–15.5)
WBC: 10.7 10*3/uL — ABNORMAL HIGH (ref 4.0–10.5)
nRBC: 0 % (ref 0.0–0.2)

## 2021-05-07 LAB — BASIC METABOLIC PANEL
Anion gap: 6 (ref 5–15)
BUN: 20 mg/dL (ref 8–23)
CO2: 27 mmol/L (ref 22–32)
Calcium: 9 mg/dL (ref 8.9–10.3)
Chloride: 105 mmol/L (ref 98–111)
Creatinine, Ser: 1.79 mg/dL — ABNORMAL HIGH (ref 0.61–1.24)
GFR, Estimated: 37 mL/min — ABNORMAL LOW (ref >60)
Glucose, Bld: 144 mg/dL — ABNORMAL HIGH (ref 70–99)
Potassium: 4 mmol/L (ref 3.5–5.1)
Sodium: 138 mmol/L (ref 135–145)

## 2021-05-07 LAB — RESP PANEL BY RT-PCR (FLU A&B, COVID) ARPGX2
Influenza A by PCR: NEGATIVE
Influenza B by PCR: NEGATIVE
SARS Coronavirus 2 by RT PCR: NEGATIVE

## 2021-05-07 LAB — MAGNESIUM: Magnesium: 2.3 mg/dL (ref 1.7–2.4)

## 2021-05-07 LAB — TROPONIN I (HIGH SENSITIVITY): Troponin I (High Sensitivity): 18 ng/L — ABNORMAL HIGH (ref <18)

## 2021-05-07 MED ORDER — MELATONIN 3 MG PO TABS
3.0000 mg | ORAL_TABLET | Freq: Every day | ORAL | Status: DC
  Administered 2021-05-07: 3 mg via ORAL
  Filled 2021-05-07 (×2): qty 1

## 2021-05-07 MED ORDER — ACETAMINOPHEN 325 MG PO TABS: 650.0000 mg | ORAL_TABLET | Freq: Four times a day (QID) | ORAL | Status: DC | PRN

## 2021-05-07 MED ORDER — METOPROLOL TARTRATE 12.5 MG HALF TABLET
12.5000 mg | ORAL_TABLET | Freq: Two times a day (BID) | ORAL | Status: DC
  Administered 2021-05-07 – 2021-05-08 (×3): 12.5 mg via ORAL
  Filled 2021-05-07 (×3): qty 1

## 2021-05-07 MED ORDER — AMIODARONE HCL IN DEXTROSE 360-4.14 MG/200ML-% IV SOLN: 60.0000 mg/h | INTRAVENOUS | Status: DC

## 2021-05-07 MED ORDER — ATORVASTATIN CALCIUM 40 MG PO TABS
40.0000 mg | ORAL_TABLET | Freq: Every day | ORAL | Status: DC
  Administered 2021-05-08 – 2021-05-09 (×2): 40 mg via ORAL
  Filled 2021-05-07 (×2): qty 1

## 2021-05-07 MED ORDER — TRAMADOL HCL 50 MG PO TABS: 50.0000 mg | ORAL_TABLET | Freq: Two times a day (BID) | ORAL | Status: DC | PRN

## 2021-05-07 MED ORDER — AMIODARONE LOAD VIA INFUSION
150.0000 mg | Freq: Once | INTRAVENOUS | Status: AC
  Administered 2021-05-07: 150 mg via INTRAVENOUS
  Filled 2021-05-07: qty 83.34

## 2021-05-07 MED ORDER — HEPARIN SODIUM (PORCINE) 5000 UNIT/ML IJ SOLN: 5000.0000 [IU] | Freq: Three times a day (TID) | INTRAMUSCULAR | Status: DC

## 2021-05-07 MED ORDER — ASPIRIN EC 81 MG PO TBEC
81.0000 mg | DELAYED_RELEASE_TABLET | Freq: Every day | ORAL | Status: DC
  Administered 2021-05-08: 81 mg via ORAL
  Filled 2021-05-07 (×2): qty 1

## 2021-05-07 MED ORDER — AMIODARONE HCL IN DEXTROSE 360-4.14 MG/200ML-% IV SOLN
30.0000 mg/h | INTRAVENOUS | Status: DC
  Administered 2021-05-08 – 2021-05-09 (×3): 30 mg/h via INTRAVENOUS
  Filled 2021-05-07 (×3): qty 200

## 2021-05-07 MED ORDER — BUDESONIDE 0.5 MG/2ML IN SUSP
0.5000 mg | Freq: Two times a day (BID) | RESPIRATORY_TRACT | Status: DC
Start: 2021-05-07 — End: 2021-05-09
  Administered 2021-05-08 – 2021-05-09 (×3): 0.5 mg via RESPIRATORY_TRACT
  Filled 2021-05-07 (×2): qty 2

## 2021-05-07 MED ORDER — AMIODARONE HCL IN DEXTROSE 360-4.14 MG/200ML-% IV SOLN
INTRAVENOUS | Status: AC
  Administered 2021-05-07: 60 mg/h via INTRAVENOUS
  Filled 2021-05-07: qty 200

## 2021-05-07 NOTE — ED Notes (Signed)
Pt had a couple runs of [facility] with no CP/SHOB present. Melina Copa, MD at bedside during event. Pt currently in Vent Bigem[place].

## 2021-05-07 NOTE — ED Triage Notes (Signed)
Pt from home BIB EMS for c/o CP earlier today (similar to pain felt prior to bypass). Pt took 1 adult ASA and nitro SL x4 '@home'$  and pain was relieved. Upon EMS arrival, pt was afib RVR (HR 110-220). 16 Cardizem IV and 217m NS given in route. Hx of ablation.

## 2021-05-07 NOTE — ED Provider Notes (Signed)
[facility] EMERGENCY DEPARTMENT Provider Note   CSN: [id number] Arrival date & time: 05/07/21  1803     History Chief Complaint  Patient presents with   Tachycardia   Chest Pain    Shawn Bauer is a 87 y.o. male.  He has a history of coronary disease CABG and A. fib.  He had an ablation about a year ago and has been in sinus since then.  Currently only on aspirin for anticoagulation.  He had an episode of chest pain earlier today 7 out of 10.  He took aspirin and nitroglycerin with some improvement.  EMS found up in A. fib with RVR and gave him IV Cardizem and IV fluids.  He currently is pain-free.  He said he felt somewhat dizzy when he experienced the chest pain.  No diaphoresis nausea or vomiting.  This discomfort was similar to when he needed to have a CABG.  The history is provided by the patient and the EMS personnel.  Chest Pain Pain location:  Substernal area Pain quality: pressure   Pain radiates to:  Does not radiate Pain severity:  Moderate Onset quality:  Sudden Duration:  5 minutes Timing:  Constant Progression:  Resolved Chronicity:  Recurrent Relieved by:  Aspirin and nitroglycerin Worsened by:  Nothing Ineffective treatments:  None tried Associated symptoms: dizziness   Associated symptoms: no abdominal pain, no cough, no diaphoresis, no fever, no nausea, no shortness of breath and no vomiting   Risk factors: coronary artery disease       Past Medical History:  Diagnosis Date   Asthma    CAD (coronary artery disease)    s/p CABG   Degenerative disk disease    cervical/spinal stenosis   Goals of care, counseling/discussion 09/30/2019   HTN (hypertension)    Hyperlipidemia    Kappa light chain myeloma ([place]) 09/30/2019    Patient Active Problem List   Diagnosis Date Noted   Typical atrial flutter ([place]) 03/29/2020   Secondary hypercoagulable state ([place]) 03/29/2020   Kappa light chain myeloma ([place]) 09/30/2019   Goals of care,  counseling/discussion 09/30/2019   Abnormal SPEP 09/08/2019   HYPERLIPIDEMIA-MIXED 10/18/2010   HYPERTENSION, BENIGN 10/18/2010   CORONARY ATHEROSLERO AUTOL VEIN BYPASS GRAFT 10/18/2010    Past Surgical History:  Procedure Laterality Date   A-FLUTTER ABLATION N/A 05/23/2020   Procedure: A-FLUTTER ABLATION;  Surgeon: Evans Lance, MD;  Location: [facility];  Service: Cardiovascular;  Laterality: N/A;   APPENDECTOMY     BACK SURGERY     bilateral inguinal hernia repair     CORONARY ARTERY BYPASS GRAFT  2001   after positive exercise treadmill test.  LIMA-LAD, Radial artery to LCx marginal, SVG to second diagonal, SVG to PDA   TONSILLECTOMY AND ADENOIDECTOMY         Family History  Problem Relation Age of Onset   Heart attack Father 38       died   Heart attack Brother     Social History   Tobacco Use   Smoking status: Former    Types: Cigarettes    Quit date: 08/31/1964    Years since quitting: 56.7   Smokeless tobacco: Former  Scientific laboratory technician Use: Never used  Substance Use Topics   Alcohol use: No   Drug use: No    Home Medications Prior to Admission medications   Medication Sig Start Date End Date Taking? Authorizing Provider  aspirin EC 81 MG tablet Take  81 mg by mouth daily. Swallow whole.    [provider]  budesonide-formoterol (SYMBICORT) 160-4.5 MCG/ACT inhaler Inhale 2 puffs into the lungs 2 (two) times daily.    [provider]  CINNAMON PO Take 1,000 mg by mouth daily.    [provider]  cyanocobalamin 1000 MCG tablet Take 1,000 mcg by mouth daily.    [provider]  lovastatin (MEVACOR) 40 MG tablet Take 80 mg by mouth daily.     [provider]  MELATONIN ER PO Take by mouth as needed.    [provider]  metoprolol succinate (TOPROL-XL) 50 MG 24 hr tablet Take 25 mg by mouth in the morning and at bedtime.  12/30/12   Sherren Mocha, MD  nitroGLYCERIN (NITROSTAT) 0.4 MG SL tablet Place  1 tablet (0.4 mg total) under the tongue every 5 (five) minutes as needed for chest pain. Patient not taking: No sig reported 11/03/19 11/02/20  Richardson Dopp T, PA-C  olmesartan (BENICAR) 20 MG tablet Take 20 mg by mouth daily. 10/20/18   [provider]  traMADol (ULTRAM) 50 MG tablet Take 1 tablet by mouth 2 (two) times daily as needed. 01/04/21   [provider]    Allergies    Patient has no known allergies.  Review of Systems   Review of Systems  Constitutional:  Negative for diaphoresis and fever.  HENT:  Negative for sore throat.   Eyes:  Negative for visual disturbance.  Respiratory:  Negative for cough and shortness of breath.   Cardiovascular:  Positive for chest pain.  Gastrointestinal:  Negative for abdominal pain, nausea and vomiting.  Genitourinary:  Negative for dysuria.  Musculoskeletal:  Negative for neck pain.  Skin:  Negative for rash.  Neurological:  Positive for dizziness.   Physical Exam Updated Vital Signs BP (!) 129/57   Pulse 83   Temp 97.7 F (36.5 C) (Oral)   Resp 14   SpO2 95%   Physical Exam Vitals and nursing note reviewed.  Constitutional:      Appearance: He is well-developed.  HENT:     Head: Normocephalic and atraumatic.  Eyes:     Conjunctiva/sclera: Conjunctivae normal.  Cardiovascular:     Rate and Rhythm: Normal rate. Rhythm irregular.     Heart sounds: No murmur heard. Pulmonary:     Effort: Pulmonary effort is normal. No respiratory distress.     Breath sounds: Normal breath sounds.  Abdominal:     Palpations: Abdomen is soft.     Tenderness: There is no abdominal tenderness.  Musculoskeletal:        General: Normal range of motion.     Cervical back: Neck supple.     Right lower leg: No tenderness.     Left lower leg: No tenderness.  Skin:    General: Skin is warm and dry.     Capillary Refill: Capillary refill takes less than 2 seconds.  Neurological:     General: No focal deficit present.     Mental  Status: He is alert.    ED Results / Procedures / Treatments   Labs (all labs ordered are listed, but only abnormal results are displayed) Labs Reviewed  CBC WITH DIFFERENTIAL/PLATELET - Abnormal; Notable for the following components:      Result Value   WBC 10.7 (*)    RBC 4.07 (*)    Hemoglobin 12.8 (*)    Monocytes Absolute 1.1 (*)    All other components within normal limits  BASIC METABOLIC PANEL - Abnormal; Notable for the following components:   Glucose, Bld 144 (*)    Creatinine, Ser 1.79 (*)    GFR, Estimated 37 (*)    All other components within normal limits  BASIC METABOLIC PANEL - Abnormal; Notable for the following components:   Glucose, Bld 103 (*)    Creatinine, Ser 1.59 (*)    Calcium 7.7 (*)    GFR, Estimated 43 (*)    All other components within normal limits  LIPID PANEL - Abnormal; Notable for the following components:   HDL 35 (*)    All other components within normal limits  TROPONIN I (HIGH SENSITIVITY) - Abnormal; Notable for the following components:   Troponin I (High Sensitivity) 18 (*)    All other components within normal limits  TROPONIN I (HIGH SENSITIVITY) - Abnormal; Notable for the following components:   Troponin I (High Sensitivity) 44 (*)    All other components within normal limits  TROPONIN I (HIGH SENSITIVITY) - Abnormal; Notable for the following components:   Troponin I (High Sensitivity) 39 (*)    All other components within normal limits  RESP PANEL BY RT-PCR (FLU A&B, COVID) ARPGX2  MAGNESIUM  TSH  HEMOGLOBIN A1C  CBC  HEPARIN LEVEL (UNFRACTIONATED)  TROPONIN I (HIGH SENSITIVITY)  TROPONIN I (HIGH SENSITIVITY)    EKG EKG Interpretation  Date/Time:  Sunday May 07 2021 18:46:39 EDT Ventricular Rate:  104 PR Interval:  285 QRS Duration: 103 QT Interval:  367 QTC Calculation: 348 R Axis:   78 Text Interpretation: Sinus tachycardia Ventricular bigeminy Prolonged PR interval Confirmed by Lorre Munroe ([id number]) on  05/08/2021 9:44:22 AM  Radiology No results found.  Procedures .Critical Care  Date/Time: 05/08/2021 10:30 AM Performed by: Hayden Rasmussen, MD Authorized by: Hayden Rasmussen, MD   Critical care provider statement:    Critical care time (minutes):  45   Critical care time was exclusive of:  Separately billable procedures and treating other patients   Critical care was necessary to treat or prevent imminent or life-threatening deterioration of the following conditions:  Cardiac failure   Critical care was time spent personally by me on the following activities:  Discussions with consultants, evaluation of patient's response to treatment, examination of patient, ordering and performing treatments and interventions, ordering and review of laboratory studies, ordering and review of radiographic studies, pulse oximetry, re-evaluation of patient's condition, obtaining history from patient or surrogate, review of old charts and development of treatment plan with patient or surrogate   Medications Ordered in ED Medications  amiodarone (NEXTERONE) 1.8 mg/mL load via infusion 150 mg (150 mg Intravenous Bolus from Bag 05/07/21 1823)    Followed by  amiodarone (NEXTERONE PREMIX) 360-4.14 MG/200ML-% (1.8 mg/mL) IV infusion (0 mg/hr Intravenous Stopped 05/08/21 0018)    Followed by  amiodarone (NEXTERONE PREMIX) 360-4.14 MG/200ML-% (1.8 mg/mL) IV infusion (30 mg/hr Intravenous Infusion Verify 05/08/21 0954)  aspirin EC tablet 81 mg (81 mg Oral Given 05/08/21 1001)  traMADol (ULTRAM) tablet 50 mg (has no administration in time range)  metoprolol tartrate (LOPRESSOR) tablet 12.5 mg (12.5 mg Oral Given 05/08/21 1001)  melatonin tablet 3 mg (3 mg Oral Given 05/07/21 2305)  budesonide (PULMICORT) nebulizer solution 0.5 mg (0.5 mg Inhalation Given 05/08/21 1001)  acetaminophen (TYLENOL) tablet 650 mg (has no administration in time range)  atorvastatin (LIPITOR) tablet 40 mg (40 mg Oral Given 05/08/21 1001)   heparin ADULT infusion 100 units/mL (25000 units/26m) (950 Units/hr Intravenous Infusion Verify  05/08/21 0955)  heparin bolus via infusion 3,000 Units (3,000 Units Intravenous Bolus from Bag 05/08/21 0538)    ED Course  I have reviewed the triage vital signs and the nursing notes.  Pertinent labs & imaging results that were available during my care of the patient were reviewed by me and considered in my medical decision making (see chart for details).  Clinical Course as of 05/08/21 1028  Sun May 07, 2021  1903 Patient having runs of wide-complex tachycardia.  Cardiology consulted.  Patient had pacer pads placed.  Amiodarone load and infusion begun.  He is asymptomatic with this. [MB]  2008 Cardiology Dr. Kalman Shan evaluated the patient and is admitted to the cardiology service. [MB]    Clinical Course User Index [MB] Hayden Rasmussen, MD   MDM Rules/Calculators/A&P                          This patient complains of chest pain and lightheadedness; this involves an extensive number of treatment Options and is a complaint that carries with it a high risk of complications and Morbidity. The differential includes ACS, arrhythmia, pneumonia, PE, pneumothorax, vascular, musculoskeletal, reflux  I ordered, reviewed and interpreted labs, which included CBC with mildly elevated white count hemoglobin similar to priors, chemistries with stable CKD low calcium, troponins mildly elevated will need to be trended, COVID testing negative I ordered medication IV amiodarone bolus and drip for nonsustained V. tach Additional history obtained from EMS Previous records obtained and reviewed in epic, follows with Dr. Lovena Le cardiology I consulted Dr. Kalman Shan cardiology and discussed lab and imaging findings  Critical Interventions: Evaluation and initiation of amiodarone for nonsustained V. tach  After the interventions stated above, I reevaluated the patient and found patient to be symptomatically improved.   Pain-free here.  Still having frequent ectopy.  Cardiology is recommending admission to the hospital for cardiac cath for possible ischemic etiology.  Patient agreeable to plan   Final Clinical Impression(s) / ED Diagnoses Final diagnoses:  Nonsustained ventricular tachycardia [facility])    Rx / DC Orders ED Discharge Orders     None        Hayden Rasmussen, MD 05/08/21 [phone]

## 2021-05-07 NOTE — ED Notes (Addendum)
Pt continues to have runs of [place]. Pt A/O and denies CP/SHOB at this time. Melina Copa, MD aware. Orders placed. See MAR for details.

## 2021-05-07 NOTE — H&P (Signed)
Cardiology History & Physical    Patient ID: Shawn Bauer MRN: [id number], DOB/AGE: Jun 30, 1934   Admit date: 05/07/2021  Primary Physician: [name], MD Primary Cardiologist: Sherren Mocha, MD  Patient Profile    Shawn Bauer is an 87 year old male with a history of CABG in 2001 (LIMA-LAD, free LRA-OM, SVG-D2, SVG-RPDA), typical AFL s/p CTI ablation, myeloma (on Velecade/decadron), CKD, asthma, and HTN  History of Present Illness    Reports acute onset chest pain earlier today as he was walking outside to [place] his yard. Pain continued after 5-6 minutes at rest and then subsided after 2 SL nitroglycerin tabs. Pain recurred again this evening around 5-6 PM so he called EMS. He took full-dose aspirin and 2 more nitro doses and was chest pain free on their arrival. He describes the episodes as a dull aching sensation, 7/10 severity at peak, associated with slight dizziness and similar to pain he was experiencing leading up to his CABG. He denies nausea, diaphoresis, or significant dyspnea. No recent leg swelling, PND, orthopnea, or syncope.   He was found to be in an intermittent WCT but HDS. Received Cardizem IV and small fluid bolus en route. ED strips show numerous runs of rapid monomorphic WCT, each lasting just few seconds at the longest, as well as frequent/bigeminal PVCs of similar morphology. No associated chest pain, and currently feels well. He was given amiodarone just prior to my evaluation and the NSVT seems to be improving. Initial labs were significant for borderline troponin of 18 and WBC 10.7, normal K/Mg, and otherwise unrevealing (Cr 1.79, Hgb 12.9 - both near baseline).   Past Medical History   Past Medical History:  Diagnosis Date   Asthma    Atrial fibrillation (HCC)    CAD (coronary artery disease)    s/p CABG   Degenerative disk disease    cervical/spinal stenosis   Goals of care, counseling/discussion 09/30/2019   HTN (hypertension)    Hyperlipidemia     Kappa light chain myeloma ([place]) 09/30/2019    Past Surgical History:  Procedure Laterality Date   A-FLUTTER ABLATION N/A 05/23/2020   Procedure: A-FLUTTER ABLATION;  Surgeon: Evans Lance, MD;  Location: [facility];  Service: Cardiovascular;  Laterality: N/A;   APPENDECTOMY     BACK SURGERY     bilateral inguinal hernia repair     CORONARY ARTERY BYPASS GRAFT  2001   after positive exercise treadmill test.  LIMA-LAD, Radial artery to LCx marginal, SVG to second diagonal, SVG to PDA   TONSILLECTOMY AND ADENOIDECTOMY       Allergies No Known Allergies  Home Medications    Prior to Admission medications   Medication Sig Start Date End Date Taking? Authorizing Provider  aspirin EC 81 MG tablet Take 81 mg by mouth daily. Swallow whole.   Yes [provider]  budesonide-formoterol (SYMBICORT) 160-4.5 MCG/ACT inhaler Inhale 2 puffs into the lungs 2 (two) times daily.   Yes [provider]  CINNAMON PO Take 1,000 mg by mouth daily.   Yes [provider]  cyanocobalamin 1000 MCG tablet Take 1,000 mcg by mouth daily.   Yes [provider]  lovastatin (MEVACOR) 40 MG tablet Take 80 mg by mouth daily.    Yes [provider]  metoprolol succinate (TOPROL-XL) 50 MG 24 hr tablet Take 25 mg by mouth in the morning and at bedtime.  12/30/12  Yes Sherren Mocha, MD  nitroGLYCERIN (NITROSTAT) 0.4 MG SL tablet Place  1 tablet (0.4 mg total) under the tongue every 5 (five) minutes as needed for chest pain. 11/03/19 05/07/21 Yes Weaver, Bluford Sedler T, PA-C  olmesartan (BENICAR) 20 MG tablet Take 20 mg by mouth daily. 10/20/18  Yes [provider]  traMADol (ULTRAM) 50 MG tablet Take 1 tablet by mouth 2 (two) times daily as needed. 01/04/21  Yes [provider]  MELATONIN ER PO Take 1 tablet by mouth daily as needed (For sleep).    [provider]    Family History    Family History  Problem Relation Age of Onset   Heart attack Father  84       died   Heart attack Brother    He indicated that his mother is deceased. He indicated that his father is deceased. He indicated that his brother is alive. He indicated that his maternal grandmother is deceased. He indicated that his maternal grandfather is deceased. He indicated that his paternal grandmother is deceased. He indicated that his paternal grandfather is deceased.   Social History    Social History   Socioeconomic History   Marital status: Married    Spouse name: Not on file   Number of children: 3   Years of education: Not on file   Highest education level: Not on file  Occupational History   Occupation: [employer]  Tobacco Use   Smoking status: Former    Types: Cigarettes    Quit date: 08/31/1964    Years since quitting: 56.7   Smokeless tobacco: Former  Scientific laboratory technician Use: Never used  Substance and Sexual Activity   Alcohol use: No   Drug use: No   Sexual activity: Not on file  Other Topics Concern   Not on file  Social History Narrative   Not on file   Social Determinants of Health   Financial Resource Strain: Not on file  Food Insecurity: Not on file  Transportation Needs: Not on file  Physical Activity: Not on file  Stress: Not on file  Social Connections: Not on file  Intimate Partner Violence: Not on file     Review of Systems    A comprehensive review of systems was performed with pertinent positives and negatives noted in the HPI  Physical Exam    BP (!) 113/42   Pulse (!) 37   Temp 97.7 F (36.5 C) (Oral)   Resp 11   SpO2 97%  General: Alert, NAD HEENT: Normal  Neck: No bruits or JVD. Lungs:  Resp regular and unlabored, CTA bilaterally. Heart: Regular rhythm, no s3, s4, or murmurs. Abdomen: Soft, non-tender, non-distended, BS +.  Extremities: Warm. No clubbing, cyanosis or edema. Palpable femoral pulses. Left radial artery excised. Right radial pulse 2+ Psych: Normal affect. Neuro: Alert and oriented. No  gross focal deficits. No abnormal movements.  Labs    Cardiac Panel (last 3 results) Recent Labs    05/07/21 1814  TROPONINIHS 18*    Lab Results  Component Value Date   WBC 10.7 (H) 05/07/2021   HGB 12.8 (L) 05/07/2021   HCT 40.2 05/07/2021   MCV 98.8 05/07/2021   PLT 213 05/07/2021    Recent Labs  Lab 05/07/21 1814  NA 138  K 4.0  CL 105  CO2 27  BUN 20  CREATININE 1.79*  CALCIUM 9.0  GLUCOSE 144*      Radiology Studies    No results found.  ECG & Cardiac Imaging   ECGs: Pre-hospital: sinus rhythm with  first degree AVB and frequent PVCs and NSVT - personally reviewed. ED arrival: sinus rhythm with first degree AVB and bigeminal PVCs, borderline criteria for IVCD and LAE - personally reviewed.  TTE 04/04/2020: 1. Left ventricular ejection fraction, by estimation, is 60 to 65%. The left ventricle has normal function. The left ventricle has no regional wall motion abnormalities. Left ventricular diastolic parameters were normal.   2. Right ventricular systolic function is low normal. The right ventricular size is mildly enlarged.   3. The mitral valve is normal in structure. Trivial mitral valve regurgitation.   4. The aortic valve is tricuspid. Aortic valve regurgitation is not visualized. Mild aortic valve sclerosis is present, with no evidence of aortic valve stenosis.   5. The inferior vena cava is normal in size with greater than 50% respiratory variability, suggesting right atrial pressure of 3 mmHg.   Assessment & Plan    Frequent monomorphic NSVT: numerous runs of MMVT on arrival though non-sustained and improving with amiodarone. Difficult localization - LBBB morphology with V3/V4 transition but inferior/right axis - suspect outflow tract though given preceding anginal symptoms and coronary history, cannot exclude ischemia; the rightward axis is also a little atypical for outflow tract VT.  - Continue amiodarone IV - Will also start low dose BB for  possible concomitant ACS with lidocaine second line if needed - TTE and coronary evaluation as noted below - Will keep Zoll pads in place for now  NSTEMI; remote history of CABG: given acute chest pain onset with angina features, mild rising troponin elevation, and frequent NSVT with acute onset, will presume ACS for now. Chest pain not correlated with arrhythmia. TIMI risk score is 5-6 (26-41% risk of all-cause mortality at 30 days). Currently chest pain-free.  - Continue trending troponin - TTE in the morning - Add-on hgb A1c and lipid profile for further risk stratification - Took full ASA pta, continue '81mg'$  daily - Start heparin, ACS protocol - Intensify statin to atorvastatin '80mg'$  daily - Start low-dose metoprolol - NPO for possible coronary angiography  - Would require femoral access due to LIMA and left radial grafts  CKD stage III: GFR 37, near recent baseline (40-45) - Hold ARB for now - Minimize contrast  - Monitor UOP/daily BMP  HTN: borderline low-BP currently - hold ARB - low-dose BB as above  Other chronic medical conditions:  -History of typical AFL s/p CTI ablation: no known recurrence post-ablation, off anticoagulation. -History of myeloma: good treatment response, plts normal. On Velcade/decadron (next dose 8/22) and Xgeva (next dose October) - Asthma: Budesonide only, holding b-agonist due to VT  Nutrition: NPO for possible cath DVT ppx: therapeutic heparin Advanced Care Planning: Full code  Signed, Marykay Lex, MD 05/07/2021, 10:18 PM

## 2021-05-07 NOTE — ED Notes (Signed)
Pt having multiple runs of VTACH. Pt place on pads. Melina Copa, MD aware.

## 2021-05-08 ENCOUNTER — Inpatient Hospital Stay (HOSPITAL_COMMUNITY): Payer: Medicare Other

## 2021-05-08 ENCOUNTER — Encounter (HOSPITAL_COMMUNITY)
Admission: EM | Disposition: A | Discharge status: Home / Self Care | Payer: Self-pay | Source: Home / Self Care | Attending: Cardiology

## 2021-05-08 ENCOUNTER — Encounter (HOSPITAL_COMMUNITY): Payer: Self-pay | Admitting: Interventional Cardiology

## 2021-05-08 ENCOUNTER — Other Ambulatory Visit: Payer: Self-pay

## 2021-05-08 DIAGNOSIS — I472 Ventricular tachycardia: Secondary | ICD-10-CM

## 2021-05-08 DIAGNOSIS — I25110 Atherosclerotic heart disease of native coronary artery with unstable angina pectoris: Secondary | ICD-10-CM | POA: Diagnosis not present

## 2021-05-08 DIAGNOSIS — N183 Chronic kidney disease, stage 3 unspecified: Secondary | ICD-10-CM

## 2021-05-08 DIAGNOSIS — I2510 Atherosclerotic heart disease of native coronary artery without angina pectoris: Secondary | ICD-10-CM

## 2021-05-08 DIAGNOSIS — I25810 Atherosclerosis of coronary artery bypass graft(s) without angina pectoris: Secondary | ICD-10-CM | POA: Diagnosis not present

## 2021-05-08 HISTORY — PX: LEFT HEART CATH AND CORS/GRAFTS ANGIOGRAPHY: CATH118250

## 2021-05-08 HISTORY — PX: CORONARY STENT INTERVENTION: CATH118234

## 2021-05-08 LAB — BASIC METABOLIC PANEL
Anion gap: 5 (ref 5–15)
BUN: 18 mg/dL (ref 8–23)
CO2: 24 mmol/L (ref 22–32)
Calcium: 7.7 mg/dL — ABNORMAL LOW (ref 8.9–10.3)
Chloride: 111 mmol/L (ref 98–111)
Creatinine, Ser: 1.59 mg/dL — ABNORMAL HIGH (ref 0.61–1.24)
GFR, Estimated: 43 mL/min — ABNORMAL LOW (ref >60)
Glucose, Bld: 103 mg/dL — ABNORMAL HIGH (ref 70–99)
Potassium: 3.6 mmol/L (ref 3.5–5.1)
Sodium: 140 mmol/L (ref 135–145)

## 2021-05-08 LAB — HEPARIN LEVEL (UNFRACTIONATED): Heparin Unfractionated: 0.56 IU/mL (ref 0.30–0.70)

## 2021-05-08 LAB — CBC
HCT: 40.1 % (ref 39.0–52.0)
Hemoglobin: 12.7 g/dL — ABNORMAL LOW (ref 13.0–17.0)
MCH: 31.3 pg (ref 26.0–34.0)
MCHC: 31.7 g/dL (ref 30.0–36.0)
MCV: 98.8 fL (ref 80.0–100.0)
Platelets: 183 10*3/uL (ref 150–400)
RBC: 4.06 MIL/uL — ABNORMAL LOW (ref 4.22–5.81)
RDW: 12.8 % (ref 11.5–15.5)
WBC: 8.8 10*3/uL (ref 4.0–10.5)
nRBC: 0 % (ref 0.0–0.2)

## 2021-05-08 LAB — LIPID PANEL
Cholesterol: 114 mg/dL (ref 0–200)
HDL: 35 mg/dL — ABNORMAL LOW (ref >40)
LDL Cholesterol: 69 mg/dL (ref 0–99)
Total CHOL/HDL Ratio: 3.3 RATIO
Triglycerides: 48 mg/dL (ref <150)
VLDL: 10 mg/dL (ref 0–40)

## 2021-05-08 LAB — ECHOCARDIOGRAM COMPLETE
Area-P 1/2: 3.93 cm2
S' Lateral: 3.3 cm
Weight: 2253.98 oz

## 2021-05-08 LAB — HEMOGLOBIN A1C
Hgb A1c MFr Bld: 6.5 % — ABNORMAL HIGH (ref 4.8–5.6)
Mean Plasma Glucose: 139.85 mg/dL

## 2021-05-08 LAB — TROPONIN I (HIGH SENSITIVITY)
Troponin I (High Sensitivity): 44 ng/L — ABNORMAL HIGH (ref <18)
Troponin I (High Sensitivity): 39 ng/L — ABNORMAL HIGH (ref <18)

## 2021-05-08 LAB — POCT ACTIVATED CLOTTING TIME: Activated Clotting Time: 398 seconds

## 2021-05-08 SURGERY — LEFT HEART CATH AND CORS/GRAFTS ANGIOGRAPHY
Anesthesia: LOCAL

## 2021-05-08 MED ORDER — SODIUM CHLORIDE 0.9 % IV SOLN: 250.0000 mL | INTRAVENOUS | Status: DC | PRN

## 2021-05-08 MED ORDER — SODIUM CHLORIDE 0.9 % WEIGHT BASED INFUSION
1.0000 mL/kg/h | INTRAVENOUS | Status: AC
  Administered 2021-05-08: 1 mL/kg/h via INTRAVENOUS

## 2021-05-08 MED ORDER — CLOPIDOGREL BISULFATE 300 MG PO TABS
ORAL_TABLET | ORAL | Status: AC
  Filled 2021-05-08: qty 1

## 2021-05-08 MED ORDER — HEPARIN (PORCINE) IN NACL 1000-0.9 UT/500ML-% IV SOLN
INTRAVENOUS | Status: AC
  Filled 2021-05-08: qty 1000

## 2021-05-08 MED ORDER — CLOPIDOGREL BISULFATE 300 MG PO TABS
ORAL_TABLET | ORAL | Status: DC | PRN
  Administered 2021-05-08: 600 mg via ORAL

## 2021-05-08 MED ORDER — HEPARIN BOLUS VIA INFUSION
3000.0000 [IU] | Freq: Once | INTRAVENOUS | Status: AC
  Administered 2021-05-08: 3000 [IU] via INTRAVENOUS
  Filled 2021-05-08: qty 3000

## 2021-05-08 MED ORDER — SODIUM CHLORIDE 0.9% FLUSH
3.0000 mL | Freq: Two times a day (BID) | INTRAVENOUS | Status: DC
  Administered 2021-05-09 (×2): 3 mL via INTRAVENOUS

## 2021-05-08 MED ORDER — LIDOCAINE HCL (PF) 1 % IJ SOLN
INTRAMUSCULAR | Status: AC
  Filled 2021-05-08: qty 30

## 2021-05-08 MED ORDER — HEPARIN (PORCINE) 25000 UT/250ML-% IV SOLN
950.0000 [IU]/h | INTRAVENOUS | Status: DC
  Administered 2021-05-08: 950 [IU]/h via INTRAVENOUS
  Filled 2021-05-08: qty 250

## 2021-05-08 MED ORDER — ACETAMINOPHEN 325 MG PO TABS: 650.0000 mg | ORAL_TABLET | ORAL | Status: DC | PRN

## 2021-05-08 MED ORDER — SODIUM CHLORIDE 0.9 % WEIGHT BASED INFUSION
1.0000 mL/kg/h | INTRAVENOUS | Status: DC
  Administered 2021-05-08: 1 mL/kg/h via INTRAVENOUS

## 2021-05-08 MED ORDER — OXYCODONE HCL 5 MG PO TABS: 5.0000 mg | ORAL_TABLET | ORAL | Status: DC | PRN

## 2021-05-08 MED ORDER — HYDRALAZINE HCL 20 MG/ML IJ SOLN: 10.0000 mg | INTRAMUSCULAR | Status: AC | PRN

## 2021-05-08 MED ORDER — SODIUM CHLORIDE 0.9 % WEIGHT BASED INFUSION: 3.0000 mL/kg/h | INTRAVENOUS | Status: DC

## 2021-05-08 MED ORDER — LIDOCAINE HCL (PF) 1 % IJ SOLN
INTRAMUSCULAR | Status: DC | PRN
  Administered 2021-05-08: 7 mL

## 2021-05-08 MED ORDER — BIVALIRUDIN BOLUS VIA INFUSION - CUPID
INTRAVENOUS | Status: DC | PRN
  Administered 2021-05-08: 47.625 mg via INTRAVENOUS

## 2021-05-08 MED ORDER — MIDAZOLAM HCL 2 MG/2ML IJ SOLN
INTRAMUSCULAR | Status: AC
  Filled 2021-05-08: qty 2

## 2021-05-08 MED ORDER — ASPIRIN 81 MG PO CHEW
81.0000 mg | CHEWABLE_TABLET | Freq: Every day | ORAL | Status: DC
  Administered 2021-05-09: 81 mg via ORAL
  Filled 2021-05-08: qty 1

## 2021-05-08 MED ORDER — BIVALIRUDIN TRIFLUOROACETATE 250 MG IV SOLR
INTRAVENOUS | Status: AC
  Filled 2021-05-08: qty 250

## 2021-05-08 MED ORDER — ONDANSETRON HCL 4 MG/2ML IJ SOLN: 4.0000 mg | Freq: Four times a day (QID) | INTRAMUSCULAR | Status: DC | PRN

## 2021-05-08 MED ORDER — MIDAZOLAM HCL 2 MG/2ML IJ SOLN
INTRAMUSCULAR | Status: DC | PRN
  Administered 2021-05-08: 1 mg via INTRAVENOUS

## 2021-05-08 MED ORDER — FENTANYL CITRATE (PF) 100 MCG/2ML IJ SOLN
INTRAMUSCULAR | Status: DC | PRN
  Administered 2021-05-08: 25 ug via INTRAVENOUS

## 2021-05-08 MED ORDER — CLOPIDOGREL BISULFATE 75 MG PO TABS
75.0000 mg | ORAL_TABLET | Freq: Every day | ORAL | Status: DC
  Administered 2021-05-09: 75 mg via ORAL
  Filled 2021-05-08: qty 1

## 2021-05-08 MED ORDER — SODIUM CHLORIDE 0.9% FLUSH: 3.0000 mL | INTRAVENOUS | Status: DC | PRN

## 2021-05-08 MED ORDER — HEPARIN (PORCINE) IN NACL 1000-0.9 UT/500ML-% IV SOLN
INTRAVENOUS | Status: DC | PRN
  Administered 2021-05-08 (×2): 500 mL

## 2021-05-08 MED ORDER — IOHEXOL 350 MG/ML SOLN
INTRAVENOUS | Status: DC | PRN
  Administered 2021-05-08: 195 mL

## 2021-05-08 MED ORDER — LABETALOL HCL 5 MG/ML IV SOLN: 10.0000 mg | INTRAVENOUS | Status: AC | PRN

## 2021-05-08 MED ORDER — FENTANYL CITRATE (PF) 100 MCG/2ML IJ SOLN
INTRAMUSCULAR | Status: AC
  Filled 2021-05-08: qty 2

## 2021-05-08 MED ORDER — SODIUM CHLORIDE 0.9 % IV SOLN
INTRAVENOUS | Status: AC | PRN
  Administered 2021-05-08: 1 mg/kg/h via INTRAVENOUS

## 2021-05-08 SURGICAL SUPPLY — 22 items
BALLN SAPPHIRE 2.5X15 (BALLOONS) ×4
BALLN SAPPHIRE ~~LOC~~ 2.5X12 (BALLOONS) ×2 IMPLANT
BALLOON SAPPHIRE 2.5X15 (BALLOONS) ×2 IMPLANT
CATH INFINITI 5 FR IM (CATHETERS) ×2 IMPLANT
CATH INFINITI 5FR JL4 (CATHETERS) ×2 IMPLANT
CATH INFINITI 5FR MPB2 (CATHETERS) ×2 IMPLANT
CATH VISTA GUIDE 6FR JR4 (CATHETERS) IMPLANT
CATH VISTA GUIDE 6FR JR4 SH (CATHETERS) ×2 IMPLANT
GUIDELINER 6F (CATHETERS) ×2 IMPLANT
KIT ENCORE 26 ADVANTAGE (KITS) ×2 IMPLANT
KIT HEART LEFT (KITS) ×2 IMPLANT
PACK CARDIAC CATHETERIZATION (CUSTOM PROCEDURE TRAY) ×2 IMPLANT
SHEATH PINNACLE 5F 10CM (SHEATH) ×2 IMPLANT
SHEATH PINNACLE 6F 10CM (SHEATH) ×2 IMPLANT
SHEATH PROBE COVER 6X72 (BAG) ×2 IMPLANT
STENT ONYX FRONTIER 2.5X12 (Permanent Stent) ×2 IMPLANT
STENT ONYX FRONTIER 2.5X22 (Permanent Stent) ×2 IMPLANT
TRANSDUCER W/STOPCOCK (MISCELLANEOUS) ×2 IMPLANT
TUBING CIL FLEX 10 FLL-RA (TUBING) ×2 IMPLANT
WIRE ASAHI PROWATER 180CM (WIRE) ×4 IMPLANT
WIRE EMERALD 3MM-J .035X150CM (WIRE) ×2 IMPLANT
WIRE HI TORQ BMW 190CM (WIRE) ×2 IMPLANT

## 2021-05-08 NOTE — CV Procedure (Signed)
Occlusion of SVG to RCA. Native RCA with 90% stenosis within a diffusely diseased heavily calcified segment in the mid to distal vessel. Occluded distal left main Occluded circumflex Patent free radial to obtuse marginal Patent SVG to diagonal Patent LIMA to LAD Complicated native RCA stent reducing 90% stenosis to 0% with overlapping 2.5 mm Onyx drug-eluting stents (22 x 2.5 and 12 x 2.5 mm) reducing the stenosis to less than 30%.  Overlapping stents were required due to balloon rupture resulting in spiral dissection propagating from distal to proximal. Will continue to run Angiomax for 2 hours to allow time for Plavix absorption.  Aspirin and Plavix for at least 1 year.

## 2021-05-08 NOTE — Progress Notes (Addendum)
Progress Note  Patient Name: Shawn Bauer [facility] Date of Encounter: 05/08/2021  [facility] Cardiologist: Sherren Mocha, MD   Subjective   Back pain from lying on stretcher.  Room also felt hot.  No further chest pain.  He does describe having discomfort prior to calling EMS that felt like his prior anginal symptoms before bypass surgery.  He also had associated dizziness.  Inpatient Medications    Scheduled Meds:  aspirin EC  81 mg Oral Daily   atorvastatin  40 mg Oral Daily   budesonide  0.5 mg Inhalation BID   melatonin  3 mg Oral QHS   metoprolol tartrate  12.5 mg Oral BID   Continuous Infusions:  amiodarone 30 mg/hr (05/08/21 0049)   heparin 950 Units/hr (05/08/21 0544)   PRN Meds: acetaminophen, traMADol   Vital Signs    Vitals:   05/08/21 0745 05/08/21 0800 05/08/21 0815 05/08/21 0830  BP: (!) 116/100 (!) 117/48 (!) 127/53 133/65  Pulse:      Resp: (!) 32 '11 15 11  '$ Temp:      TempSrc:      SpO2: 97% 97% 100% 96%    Intake/Output Summary (Last 24 hours) at 05/08/2021 0851 Last data filed at 05/08/2021 0004 Gross per 24 hour  Intake 200 ml  Output --  Net 200 ml   Last 3 Weights 04/03/2021 03/06/2021 02/17/2021  Weight (lbs) 140 lb 12.8 oz 140 lb 141 lb 0.6 oz  Weight (kg) 63.866 kg 63.504 kg 63.975 kg      Telemetry    Normal sinus rhythm with PVCs; prior to starting amiodarone, there are short bursts of nonsustained ventricular tachycardia- Personally Reviewed  ECG    Normal sinus rhythm, prolonged PR, PVCs in pattern of bigeminy- Personally Reviewed  Physical Exam   GEN: No acute distress.  Somewhat frail Neck: No JVD Cardiac: RRR, no murmurs, rubs, or gallops.  Respiratory: Clear to auscultation bilaterally. GI: Soft, nontender, non-distended  MS: No edema; No deformity. Neuro:  Nonfocal  Psych: Normal affect   Labs    High Sensitivity Troponin:   Recent Labs  Lab 05/07/21 1814 05/07/21 2300 05/08/21 0645  TROPONINIHS 18* 44* 39*       Chemistry Recent Labs  Lab 05/07/21 1814 05/08/21 0645  NA 138 140  K 4.0 3.6  CL 105 111  CO2 27 24  GLUCOSE 144* 103*  BUN 20 18  CREATININE 1.79* 1.59*  CALCIUM 9.0 7.7*  GFRNONAA 37* 43*  ANIONGAP 6 5     Hematology Recent Labs  Lab 05/07/21 1814  WBC 10.7*  RBC 4.07*  HGB 12.8*  HCT 40.2  MCV 98.8  MCH 31.4  MCHC 31.8  RDW 12.7  PLT 213    BNPNo results for input(s): BNP, PROBNP in the last 168 hours.   DDimer No results for input(s): DDIMER in the last 168 hours.   Radiology    No results found.  Cardiac Studies   Enzymes mildly elevated in a flat pattern  Patient Profile     87 y.o. male with known CAD and now with nonsustained ventricular tachycardia associated with chest discomfort and dizziness  Assessment & Plan    Plan for cardiac cath to evaluate for ischemic origin.  Continue IV heparin and IV amiodarone for the ventricular tachycardia at this point.  All questions about cardiac cath were answered.  The risks and benefits were discussed.  Apparently, he had a left radial graft.  Will need femoral approach.  Minimize dye due to chronic renal insufficiency.  The patient understands that risks include but are not limited to stroke (1 in 1000), death (1 in 70), kidney failure [usually temporary] (1 in 500), bleeding (1 in 200), allergic reaction [possibly serious] (1 in 200), and agrees to proceed.    For questions or updates, please contact [place] Please consult [email or web address] for contact info under        Signed, Larae Grooms, MD  05/08/2021, 8:51 AM

## 2021-05-08 NOTE — Progress Notes (Signed)
73f sheath aspirated and removed from right femoral artery. Manual pressure applied for 25 minutes. Site level 0, no s+s of hematoma. Tegaderm dressing applied, bedrest instructions given.   Bilateral dp and pt pulses palpable, pt pulses weaker.  Bedrest begins at 19:50:00

## 2021-05-08 NOTE — Progress Notes (Signed)
ANTICOAGULATION CONSULT NOTE - Initial Consult  Pharmacy Consult for Heparin Indication: chest pain/ACS  No Known Allergies  Patient Measurements: Weight: 63.9 kg (140 lb 14 oz)  Vital Signs: Temp: 98.1 F (36.7 C) (08/15 0740) Temp Source: Oral (08/15 0740) BP: 122/66 (08/15 1100) Pulse Rate: 72 (08/15 1100)  Labs: Recent Labs    05/07/21 1814 05/07/21 2300 05/08/21 0645 05/08/21 1055  HGB 12.8*  --   --  12.7*  HCT 40.2  --   --  40.1  PLT 213  --   --  183  HEPARINUNFRC  --   --   --  0.56  CREATININE 1.79*  --  1.59*  --   TROPONINIHS 18* 44* 39*  --      Estimated Creatinine Clearance: 31.3 mL/min (A) (by C-G formula based on SCr of 1.59 mg/dL (H)).   Medical History: Past Medical History:  Diagnosis Date   Asthma    Atrial fibrillation ([place])    CAD (coronary artery disease)    s/p CABG   Degenerative disk disease    cervical/spinal stenosis   Goals of care, counseling/discussion 09/30/2019   HTN (hypertension)    Hyperlipidemia    Kappa light chain myeloma ([place]) 09/30/2019    Medications:  No current facility-administered medications on file prior to encounter.   Current Outpatient Medications on File Prior to Encounter  Medication Sig Dispense Refill   aspirin EC 81 MG tablet Take 81 mg by mouth daily. Swallow whole.     budesonide-formoterol (SYMBICORT) 160-4.5 MCG/ACT inhaler Inhale 2 puffs into the lungs 2 (two) times daily.     CINNAMON PO Take 1,000 mg by mouth daily.     cyanocobalamin 1000 MCG tablet Take 1,000 mcg by mouth daily.     lovastatin (MEVACOR) 40 MG tablet Take 80 mg by mouth daily.      metoprolol succinate (TOPROL-XL) 50 MG 24 hr tablet Take 25 mg by mouth in the morning and at bedtime.      nitroGLYCERIN (NITROSTAT) 0.4 MG SL tablet Place 1 tablet (0.4 mg total) under the tongue every 5 (five) minutes as needed for chest pain. 25 tablet 11   olmesartan (BENICAR) 20 MG tablet Take 20 mg by mouth daily.     traMADol (ULTRAM)  50 MG tablet Take 1 tablet by mouth 2 (two) times daily as needed.     MELATONIN ER PO Take 1 tablet by mouth daily as needed (For sleep).       Assessment: 87 y.o. male with CABG in 2001 (LIMA-LAD, free LRA-OM, SVG-D2, SVG-RPDA), typical AFL s/p CTI ablation, myeloma (on Velecade/decadron), CKD, asthma, and HTN. Arrives with CP to ED - found to be in NSVT. Heparin consult for ACS/STEMI.  Heparin level 0.56 this AM. CBC wnl, no s/sx of bleeding.  Goal of Therapy:  Heparin level 0.3-0.7 units/ml Monitor platelets by anticoagulation protocol: Yes   Plan:  Continue heparin gtt at 950 units/hr -Monitor daily HL, CBC, and any s/sx of bleeding  Joetta Manners, PharmD, [facility] Emergency Medicine Clinical Pharmacist ED RPh Phone: [place]: [phone]

## 2021-05-08 NOTE — ED Notes (Signed)
PT alert, NAD, calm, interactive, resps e/u, speaking in clear complete sentences, VSS. Denies pain, nausea, palpitations, sob, n/t, dizziness or other sx. States, "feel normal". NSR, HR 70, no current ectopy.

## 2021-05-08 NOTE — Progress Notes (Deleted)
Progress Note  Patient Name: Shawn Bauer [facility] Date of Encounter: 05/08/2021  Primary Cardiologist: Sherren Mocha, MD   Subjective   Shawn Bauer is resting in bed comfortably. Denies episodes of chest pain, shortness of breath, dizziness, or lightheadedness since admission. Does have some lower back pain from laying in bed all evening. He understands plan to go for cardiac cath and echocardiogram. He has no further questions or concerns at this time.   Inpatient Medications    Scheduled Meds:  aspirin EC  81 mg Oral Daily   atorvastatin  40 mg Oral Daily   budesonide  0.5 mg Inhalation BID   melatonin  3 mg Oral QHS   metoprolol tartrate  12.5 mg Oral BID   Continuous Infusions:  amiodarone 30 mg/hr (05/08/21 0049)   heparin 950 Units/hr (05/08/21 0544)   PRN Meds: acetaminophen, traMADol   Vital Signs    Vitals:   05/08/21 0745 05/08/21 0800 05/08/21 0815 05/08/21 0830  BP: (!) 116/100 (!) 117/48 (!) 127/53 133/65  Pulse:      Resp: (!) 32 '11 15 11  '$ Temp:      TempSrc:      SpO2: 97% 97% 100% 96%    Intake/Output Summary (Last 24 hours) at 05/08/2021 0954 Last data filed at 05/08/2021 0004 Gross per 24 hour  Intake 200 ml  Output --  Net 200 ml   Telemetry    Episodes of nonsustained VT and frequent PVC's - Personally Reviewed  ECG    Sinus tachycardia, PVC's present. PR prolongation, type I AV block. No ST wave abnormalities. - Personally Reviewed  Physical Exam   GEN: No acute distress.   Neck: No JVD Cardiac: RRR, no murmurs, rubs, or gallops.  Respiratory: Clear to auscultation bilaterally. GI: Soft, nontender, non-distended  MS: No edema; No deformity. Extremities warm to touch. Pacer pads in place. Neuro:  Nonfocal  Psych: Normal affect   Labs    Chemistry Recent Labs  Lab 05/07/21 1814 05/08/21 0645  NA 138 140  K 4.0 3.6  CL 105 111  CO2 27 24  GLUCOSE 144* 103*  BUN 20 18  CREATININE 1.79* 1.59*  CALCIUM 9.0 7.7*  GFRNONAA 37*  43*  ANIONGAP 6 5     Hematology Recent Labs  Lab 05/07/21 1814  WBC 10.7*  RBC 4.07*  HGB 12.8*  HCT 40.2  MCV 98.8  MCH 31.4  MCHC 31.8  RDW 12.7  PLT 213   High Sensitivity Troponins - 18>44>39  Radiology    None  Cardiac Studies   TTE 04/04/2020: 1. Left ventricular ejection fraction, by estimation, is 60 to 65%. The left ventricle has normal function. The left ventricle has no regional wall motion abnormalities. Left ventricular diastolic parameters were normal.   2. Right ventricular systolic function is low normal. The right ventricular size is mildly enlarged.   3. The mitral valve is normal in structure. Trivial mitral valve regurgitation.   4. The aortic valve is tricuspid. Aortic valve regurgitation is not visualized. Mild aortic valve sclerosis is present, with no evidence of aortic valve stenosis.   5. The inferior vena cava is normal in size with greater than 50% respiratory variability, suggesting right atrial pressure of 3 mmHg.   AF Ablation 05/23/2020 1. Isthmus-dependent right atrial flutter upon presentation.  2. Successful radiofrequency ablation of atrial flutter along the cavotricuspid isthmus with complete bidirectional isthmus block achieved.  3. No inducible arrhythmias following ablation.  4. No early apparent complications.  Patient Profile     87 y.o. male with a PMHx of of CABG in 2001 (LIMA-LAD, free LRA-OM, SVG-D2, SVG-RPDA), typical AFL s/p CTI ablation, myeloma (on Velecade/decadron), CKD, asthma, and HTN admitted for episodes of chest pain and found to have nonsustained v. Tach and mildly elevated high sensitivity troponins.  Assessment & Plan    Frequent monomorphic NSVT: Continues to have episodes of NSVT, however, appear less frequent after amiodarone started. Patient has remained asymptomatic with pacer pads in place.  -Continue amiodarone IV -Continue low dose BB -Pending cardiac catheterization and TTE -Keep pacer pads in  place -Electrolytes have remained stable, continue daily BMP and mag   NSTEMI; remote history of CABG:  Patient has remained asymptomatic since admission, but has yet to exert himself.  High-sensitivity troponins mildly elevated. TIMI risk score is 5-6 (26-41% risk of all-cause mortality at 30 days).  Plan for cardiac catheterization today. -Cardiac catheterization today, patient has been n.p.o. since midnight -TTE pending -Continue IV heparin, aspirin metoprolol, atorvastatin -Heparin levels per pharmacy -A1c pending, total cholesterol 114 and LDL 69   CKD stage IIIb:  Recent baseline creatinine of 1.6-1.8.  Baseline GFR 40s.  Creatinine and GFR are both stable.  Would hold ARB in setting of hypotension.  -Continue to hold ARB for now -Minimize contrast  -Monitor UOP/daily BMP   HTN BP continues to fluctuate between normotensive and hypotensive episodes.  We will continue to hold ARB. -Hold ARB -Continue metoprolol   History of typical AFL s/p CTI ablation No known recurrence post-ablation, off anticoagulation.  IgG Kappa Myeloma Followed closely by heme/onc. Current treatment of Velcade/decadron (next dose 8/22) and Xgeva (next dose October). Platelets normal during admission.  -Continue to follow with heme-onc outpatient basis  History of Asthma No wheezing on exam today, continue to hold beta agonist medications   Nutrition: NPO for possible cath DVT ppx: therapeutic heparin Advanced Care Planning: Full code  Please await attending note for final recommedations  For questions or updates, please contact [place] Please consult [email or web address] for contact info under Cardiology/STEMI.   Signed, Sanjuana Letters, MD  Internal Medicine Resident PGY-2 05/08/2021, 9:54 AM

## 2021-05-08 NOTE — ED Notes (Signed)
Cardiology at BS

## 2021-05-08 NOTE — Progress Notes (Signed)
  Echocardiogram 2D Echocardiogram has been performed.  Shawn Bauer 05/08/2021, 11:52 AM

## 2021-05-08 NOTE — Progress Notes (Signed)
ANTICOAGULATION CONSULT NOTE - Initial Consult  Pharmacy Consult for Heparin Indication: chest pain/ACS  No Known Allergies  Patient Measurements:    Vital Signs: Temp: 97.7 F (36.5 C) (08/14 1811) Temp Source: Oral (08/14 1811) BP: 109/58 (08/15 0100) Pulse Rate: 67 (08/15 0100)  Labs: Recent Labs    05/07/21 1814 05/07/21 2300  HGB 12.8*  --   HCT 40.2  --   PLT 213  --   CREATININE 1.79*  --   TROPONINIHS 18* 44*    CrCl cannot be calculated (Unknown ideal weight.).   Medical History: Past Medical History:  Diagnosis Date   Asthma    Atrial fibrillation ([place])    CAD (coronary artery disease)    s/p CABG   Degenerative disk disease    cervical/spinal stenosis   Goals of care, counseling/discussion 09/30/2019   HTN (hypertension)    Hyperlipidemia    Kappa light chain myeloma ([place]) 09/30/2019    Medications:  No current facility-administered medications on file prior to encounter.   Current Outpatient Medications on File Prior to Encounter  Medication Sig Dispense Refill   aspirin EC 81 MG tablet Take 81 mg by mouth daily. Swallow whole.     budesonide-formoterol (SYMBICORT) 160-4.5 MCG/ACT inhaler Inhale 2 puffs into the lungs 2 (two) times daily.     CINNAMON PO Take 1,000 mg by mouth daily.     cyanocobalamin 1000 MCG tablet Take 1,000 mcg by mouth daily.     lovastatin (MEVACOR) 40 MG tablet Take 80 mg by mouth daily.      metoprolol succinate (TOPROL-XL) 50 MG 24 hr tablet Take 25 mg by mouth in the morning and at bedtime.      nitroGLYCERIN (NITROSTAT) 0.4 MG SL tablet Place 1 tablet (0.4 mg total) under the tongue every 5 (five) minutes as needed for chest pain. 25 tablet 11   olmesartan (BENICAR) 20 MG tablet Take 20 mg by mouth daily.     traMADol (ULTRAM) 50 MG tablet Take 1 tablet by mouth 2 (two) times daily as needed.     MELATONIN ER PO Take 1 tablet by mouth daily as needed (For sleep).       Assessment: 87 y.o. male with chest  pain/Afib for heparin Goal of Therapy:  Heparin level 0.3-0.7 units/ml Monitor platelets by anticoagulation protocol: Yes   Plan:  Heparin 3000 units IV bolus, then start heparin 950 units/hr Check heparin level in 8 hours.   Shawn Bauer 05/08/2021,2:32 AM

## 2021-05-09 ENCOUNTER — Other Ambulatory Visit (HOSPITAL_COMMUNITY): Payer: Self-pay

## 2021-05-09 ENCOUNTER — Encounter: Payer: Self-pay | Admitting: Hematology & Oncology

## 2021-05-09 ENCOUNTER — Other Ambulatory Visit: Payer: Self-pay | Admitting: Physician Assistant

## 2021-05-09 ENCOUNTER — Inpatient Hospital Stay (INDEPENDENT_AMBULATORY_CARE_PROVIDER_SITE_OTHER): Payer: Medicare Other

## 2021-05-09 DIAGNOSIS — I472 Ventricular tachycardia: Secondary | ICD-10-CM | POA: Diagnosis not present

## 2021-05-09 DIAGNOSIS — N183 Chronic kidney disease, stage 3 unspecified: Secondary | ICD-10-CM | POA: Diagnosis not present

## 2021-05-09 DIAGNOSIS — I4729 Other ventricular tachycardia: Secondary | ICD-10-CM

## 2021-05-09 DIAGNOSIS — I493 Ventricular premature depolarization: Secondary | ICD-10-CM

## 2021-05-09 DIAGNOSIS — I10 Essential (primary) hypertension: Secondary | ICD-10-CM

## 2021-05-09 DIAGNOSIS — I25110 Atherosclerotic heart disease of native coronary artery with unstable angina pectoris: Secondary | ICD-10-CM | POA: Diagnosis not present

## 2021-05-09 LAB — BASIC METABOLIC PANEL
Anion gap: 6 (ref 5–15)
BUN: 16 mg/dL (ref 8–23)
CO2: 26 mmol/L (ref 22–32)
Calcium: 8.7 mg/dL — ABNORMAL LOW (ref 8.9–10.3)
Chloride: 108 mmol/L (ref 98–111)
Creatinine, Ser: 1.65 mg/dL — ABNORMAL HIGH (ref 0.61–1.24)
GFR, Estimated: 41 mL/min — ABNORMAL LOW (ref >60)
Glucose, Bld: 140 mg/dL — ABNORMAL HIGH (ref 70–99)
Potassium: 4.3 mmol/L (ref 3.5–5.1)
Sodium: 140 mmol/L (ref 135–145)

## 2021-05-09 MED ORDER — METOPROLOL SUCCINATE ER 25 MG PO TB24
25.0000 mg | ORAL_TABLET | Freq: Every day | ORAL | Status: DC
  Administered 2021-05-09: 25 mg via ORAL
  Filled 2021-05-09: qty 1

## 2021-05-09 MED ORDER — CLOPIDOGREL BISULFATE 75 MG PO TABS
75.0000 mg | ORAL_TABLET | Freq: Every day | ORAL | 11 refills | Status: DC
  Filled 2021-05-09: qty 30, 30d supply, fill #0

## 2021-05-09 MED ORDER — ATORVASTATIN CALCIUM 40 MG PO TABS
40.0000 mg | ORAL_TABLET | Freq: Every day | ORAL | 3 refills | Status: DC
  Filled 2021-05-09: qty 90, 90d supply, fill #0

## 2021-05-09 MED ORDER — METOPROLOL SUCCINATE ER 25 MG PO TB24: 25.0000 mg | ORAL_TABLET | Freq: Two times a day (BID) | ORAL | Status: DC

## 2021-05-09 NOTE — Discharge Summary (Addendum)
Discharge Summary    Patient ID: Shawn Bauer MRN: [id number]; DOB: June 01, 1934  Admit date: 05/07/2021 Discharge date: 05/09/2021  PCP:  [name], MD   [facility] Providers Cardiologist:  Sherren Mocha, MD   {  Discharge Diagnoses    Principal Problem:   Non-ST elevation (NSTEMI) myocardial infarction [facility]) Active Problems:   HYPERTENSION, BENIGN   CORONARY ATHEROSLERO AUTOL VEIN BYPASS GRAFT   Typical atrial flutter ([place])   Secondary hypercoagulable state ([place])   Ventricular tachycardia ([place])   PVCs   CKD III   HLD   Diagnostic Studies/Procedures    CORONARY STENT INTERVENTION  05/08/21  LEFT HEART CATH AND CORS/GRAFTS ANGIOGRAPHY    Conclusion       Occlusion of saphenous vein graft to right coronary   Patent saphenous vein graft to diagonal   Patent free left radial to circumflex   Patent LIMA to LAD   Total occlusion of proximal circumflex   99% distal left main   Total occlusion of proximal to mid LAD   Native RCA heavily calcified with ostial to distal disease.  Focal 90% stenosis within a diffusely diseased segment felt to be culprit for angina.  TIMI grade III flow noted.   Successful high risk RCA PCI complicated by balloon rupture and spiral dissection treated with overlapping 2.5 mm drug-eluting stents 12 mm x 2.5 distal overlap with a 22 x 2.5 mm from distal to mid vessel.  TIMI grade III flow.  Residual stenosis at the overlap site approximately 20%.   Aspirin and Plavix for at least 12 months then monotherapy with Plavix thereafter. Bivalirudin for 2 hours before discontinuation.  Half dose infusion. Preventive therapy per treating team. At risk for development of femoral bleed.  Needs to be followed closely since sheath will not be pulled in the Cath Lab.   Diagnostic Dominance: Right Intervention   Implants        Echo 05/08/21  1. Left ventricular ejection fraction, by estimation, is 55 to 60%. The  left ventricle has normal  function. The left ventricle demonstrates global  hypokinesis. There is mild concentric left ventricular hypertrophy. Left  ventricular diastolic parameters  are consistent with Grade I diastolic dysfunction (impaired relaxation).   2. Right ventricular systolic function is normal. The right ventricular  size is normal. There is normal pulmonary artery systolic pressure.   3. The mitral valve is grossly normal. No evidence of mitral valve  regurgitation. No evidence of mitral stenosis.   4. The aortic valve is tricuspid. Aortic valve regurgitation is not  visualized. No aortic stenosis is present.   5. The inferior vena cava is normal in size with greater than 50%  respiratory variability, suggesting right atrial pressure of 3 mmHg.     History of Present Illness     Shawn Bauer is a 87 y.o. male with history of CAD s/p CABG in 2001, hypertension, hyperlipidemia, atrial flutter s/p ablation by Dr. Lovena Le, chronic kidney disease stage III and multiple myeloma presented for chest pain and dizziness evaluation.  Hospital Course     Consultants: None  Chest pain with mildly elevated troponin/CAD -High-sensitivity troponin 14>>44>>39.  His chest pain concerning for angina.  He was having frequent nonsustained VT.  Treated with IV heparin.  Cardiac catheterization showed occluded SVG to RCA and heavily calcified native RCA.  S/p successful high risk RCA PCI complicated by balloon rupture and spiral dissection treated with overlapping 2.5 mm drug-eluting stents 12 mm x 2.5 distal  overlap with a 22 x 2.5 mm from distal to mid vessel.  TIMI grade III flow.  Residual stenosis at the overlap site approximately 20%. Plan for aspirin and Plavix for at least 12 months then monotherapy with Plavix thereafter.  No recurrent chest pain.  Echocardiogram showed LV function of 55 to 60% and grade 1 diastolic dysfunction.  Ambulated without chest pain.    2. NSVT/ PVCS -Patient was having numerous runs of  monomorphic nonsustained VT on arrival.  He was started on IV amiodarone. Nonsustained VT has improved but continued to have frequent PVCS.  -No recurrent NSVT while off amiodarone but having multiple PVCs - Treated with metoprolol 12.5 mg twice daily while admitted however transitioned home dose of Toprol-XL 25 mg twice daily at discharge  -Magnesium > 2  -Potassium above 4 - Outpatient Zio-Live monitor   3.  CKD stage III -Creatinine stable at 1.5-1.6 range   4.  Hypertension -Blood pressure stable on beta-blocker -Resumed Benicar at discharge    5. HLD -05/08/2021: Cholesterol 114; HDL 35; LDL Cholesterol 69; Triglycerides 48; VLDL 10  - Continue statin     Did the patient have an acute coronary syndrome (MI, NSTEMI, STEMI, etc) this admission?:  Yes                               AHA/ACC Clinical Performance & Quality Measures: Aspirin prescribed? - Yes ADP Receptor Inhibitor (Plavix/Clopidogrel, Brilinta/Ticagrelor or Effient/Prasugrel) prescribed (includes medically managed patients)? - Yes Beta Blocker prescribed? - Yes High Intensity Statin (Lipitor 40-14m or Crestor 20-465m prescribed? - Yes EF assessed during THIS hospitalization? - Yes For EF <40%, was ACEI/ARB prescribed? - Yes For EF <40%, Aldosterone Antagonist (Spironolactone or Eplerenone) prescribed? - Not Applicable (EF >/= 4022%Cardiac Rehab Phase II ordered (including medically managed patients)? - Yes      _____________  Discharge Vitals Blood pressure 131/81, pulse 86, temperature 98.2 F (36.8 C), temperature source Oral, resp. rate 18, height '5\' 10"'  (1.778 m), weight 63.5 kg, SpO2 98 %.  Filed Weights   05/08/21 1045 05/08/21 1203  Weight: 63.9 kg 63.5 kg    Labs & Radiologic Studies    CBC Recent Labs    05/07/21 1814 05/08/21 1055  WBC 10.7* 8.8  NEUTROABS 7.5  --   HGB 12.8* 12.7*  HCT 40.2 40.1  MCV 98.8 98.8  PLT 213 18025 Basic Metabolic Panel Recent Labs    05/07/21 1814  05/08/21 0645 05/09/21 0306  NA 138 140 140  K 4.0 3.6 4.3  CL 105 111 108  CO2 '27 24 26  ' GLUCOSE 144* 103* 140*  BUN '20 18 16  ' CREATININE 1.79* 1.59* 1.65*  CALCIUM 9.0 7.7* 8.7*  MG 2.3  --   --     High Sensitivity Troponin:   Recent Labs  Lab 05/07/21 1814 05/07/21 2300 05/08/21 0645  TROPONINIHS 18* 44* 39*   Hemoglobin A1C Recent Labs    05/08/21 1055  HGBA1C 6.5*   Fasting Lipid Panel Recent Labs    05/08/21 0645  CHOL 114  HDL 35*  LDLCALC 69  TRIG 48  CHOLHDL 3.3    _____________  CARDIAC CATHETERIZATION  Result Date: 05/08/2021   Occlusion of saphenous vein graft to right coronary   Patent saphenous vein graft to diagonal   Patent free left radial to circumflex   Patent LIMA to LAD   Total occlusion of proximal circumflex  99% distal left main   Total occlusion of proximal to mid LAD   Native RCA heavily calcified with ostial to distal disease.  Focal 90% stenosis within a diffusely diseased segment felt to be culprit for angina.  TIMI grade III flow noted.   Successful high risk RCA PCI complicated by balloon rupture and spiral dissection treated with overlapping 2.5 mm drug-eluting stents 12 mm x 2.5 distal overlap with a 22 x 2.5 mm from distal to mid vessel.  TIMI grade III flow.  Residual stenosis at the overlap site approximately 20%. Aspirin and Plavix for at least 12 months then monotherapy with Plavix thereafter. Bivalirudin for 2 hours before discontinuation.  Half dose infusion. Preventive therapy per treating team. At risk for development of femoral bleed.  Needs to be followed closely since sheath will not be pulled in the Cath Lab.   ECHOCARDIOGRAM COMPLETE  Result Date: 05/08/2021    ECHOCARDIOGRAM REPORT   Patient Name:   Shawn Bauer [facility] Date of Exam: 05/08/2021 Medical Rec #:  [id number]      Height:       71.0 in Accession #:    [id number]     Weight:       140.9 lb Date of Birth:  1933/10/25     BSA:          1.817 m Patient Age:    45 years        BP:           122/66 mmHg Patient Gender: M              HR:           76 bpm. Exam Location:  Inpatient Procedure: 2D Echo, Cardiac Doppler and Color Doppler Indications:    Ventricular tachycardia  History:        Patient has prior history of Echocardiogram examinations, most                 recent 04/04/2020. CAD, Prior CABG, Arrythmias:Atrial                 Fibrillation and ablation, Signs/Symptoms:Chest Pain and CKD;                 Risk Factors:Hypertension.  Sonographer:    Dustin Flock RDCS Referring Phys: [place]  1. Left ventricular ejection fraction, by estimation, is 55 to 60%. The left ventricle has normal function. The left ventricle demonstrates global hypokinesis. There is mild concentric left ventricular hypertrophy. Left ventricular diastolic parameters are consistent with Grade I diastolic dysfunction (impaired relaxation).  2. Right ventricular systolic function is normal. The right ventricular size is normal. There is normal pulmonary artery systolic pressure.  3. The mitral valve is grossly normal. No evidence of mitral valve regurgitation. No evidence of mitral stenosis.  4. The aortic valve is tricuspid. Aortic valve regurgitation is not visualized. No aortic stenosis is present.  5. The inferior vena cava is normal in size with greater than 50% respiratory variability, suggesting right atrial pressure of 3 mmHg. Comparison(s): A prior study was performed on 04/04/2020. PVCs more frequent during exam. FINDINGS  Left Ventricle: Left ventricular ejection fraction, by estimation, is 55 to 60%. The left ventricle has normal function. The left ventricle demonstrates global hypokinesis. The left ventricular internal cavity size was normal in size. There is mild concentric left ventricular hypertrophy. Left ventricular diastolic parameters are consistent with Grade I diastolic dysfunction (impaired relaxation). Right Ventricle: The right  ventricular size is normal. No  increase in right ventricular wall thickness. Right ventricular systolic function is normal. There is normal pulmonary artery systolic pressure. The tricuspid regurgitant velocity is 2.12 m/s, and  with an assumed right atrial pressure of 3 mmHg, the estimated right ventricular systolic pressure is 44.3 mmHg. Left Atrium: Left atrial size was normal in size. Right Atrium: Right atrial size was normal in size. Pericardium: There is no evidence of pericardial effusion. Mitral Valve: The mitral valve is grossly normal. No evidence of mitral valve regurgitation. No evidence of mitral valve stenosis. Tricuspid Valve: The tricuspid valve is grossly normal. Tricuspid valve regurgitation is trivial. Aortic Valve: The aortic valve is tricuspid. Aortic valve regurgitation is not visualized. No aortic stenosis is present. Pulmonic Valve: The pulmonic valve was not well visualized. Pulmonic valve regurgitation is mild. No evidence of pulmonic stenosis. Aorta: The aortic root is normal in size and structure and the ascending aorta was not well visualized. Venous: The inferior vena cava is normal in size with greater than 50% respiratory variability, suggesting right atrial pressure of 3 mmHg. IAS/Shunts: The atrial septum is grossly normal.  LEFT VENTRICLE PLAX 2D LVIDd:         4.10 cm  Diastology LVIDs:         3.30 cm  LV e' medial:    6.20 cm/s LV PW:         1.20 cm  LV E/e' medial:  12.9 LV IVS:        1.20 cm  LV e' lateral:   8.81 cm/s LVOT diam:     2.10 cm  LV E/e' lateral: 9.1 LV SV:         51 LV SV Index:   28 LVOT Area:     3.46 cm  RIGHT VENTRICLE RV Basal diam:  3.20 cm RV S prime:     3.92 cm/s TAPSE (M-mode): 1.5 cm LEFT ATRIUM             Index       RIGHT ATRIUM           Index LA diam:        3.60 cm 1.98 cm/m  RA Area:     19.40 cm LA Vol (A2C):   30.1 ml 16.57 ml/m RA Volume:   54.00 ml  29.72 ml/m LA Vol (A4C):   41.0 ml 22.57 ml/m LA Biplane Vol: 38.3 ml 21.08 ml/m  AORTIC VALVE LVOT Vmax:   64.60  cm/s LVOT Vmean:  41.400 cm/s LVOT VTI:    0.148 m  AORTA Ao Root diam: 3.30 cm MITRAL VALVE               TRICUSPID VALVE MV Area (PHT): 3.93 cm    TR Peak grad:   18.0 mmHg MV Decel Time: 193 msec    TR Vmax:        212.00 cm/s MV E velocity: 80.10 cm/s MV A velocity: 66.40 cm/s  SHUNTS MV E/A ratio:  1.21        Systemic VTI:  0.15 m                            Systemic Diam: 2.10 cm Rudean Haskell MD Electronically signed by Rudean Haskell MD Signature Date/Time: 05/08/2021/1:24:14 PM    Final    Disposition   Pt is being discharged home today in good condition.  Follow-up Plans & Appointments  Follow-up Information     Sherren Mocha, MD Follow up.   Specialty: Cardiology Why: office will call with date and time of appointment and monitor Contact information: [address] [place] [address] [phone]                Discharge Instructions     Amb Referral to Cardiac Rehabilitation   Complete by: As directed    Diagnosis: Coronary Stents   After initial evaluation and assessments completed: Virtual Based Care may be provided alone or in conjunction with Phase 2 Cardiac Rehab based on patient barriers.: Yes   Diet - low sodium heart healthy   Complete by: As directed    Discharge instructions   Complete by: As directed    No driving for 48 hours. No lifting over 5 lbs for 1 week. No sexual activity for 1 week. Keep procedure site clean & dry. If you notice increased pain, swelling, bleeding or pus, call/return!  You may shower, but no soaking baths/hot tubs/pools for 1 week.   Increase activity slowly   Complete by: As directed        Discharge Medications   Allergies as of 05/09/2021   No Known Allergies      Medication List     STOP taking these medications    lovastatin 40 MG tablet Commonly known as: MEVACOR       TAKE these medications    aspirin EC 81 MG tablet Take 81 mg by mouth daily. Swallow whole.    atorvastatin 40 MG tablet Commonly known as: LIPITOR Take 1 tablet (40 mg total) by mouth daily.   budesonide-formoterol 160-4.5 MCG/ACT inhaler Commonly known as: SYMBICORT Inhale 2 puffs into the lungs 2 (two) times daily.   CINNAMON PO Take 1,000 mg by mouth daily.   clopidogrel 75 MG tablet Commonly known as: PLAVIX Take 1 tablet (75 mg total) by mouth daily with breakfast.   cyanocobalamin 1000 MCG tablet Take 1,000 mcg by mouth daily.   MELATONIN ER PO Take 1 tablet by mouth daily as needed (For sleep).   metoprolol succinate 50 MG 24 hr tablet Commonly known as: TOPROL-XL Take 25 mg by mouth in the morning and at bedtime.   nitroGLYCERIN 0.4 MG SL tablet Commonly known as: NITROSTAT Place 1 tablet (0.4 mg total) under the tongue every 5 (five) minutes as needed for chest pain.   olmesartan 20 MG tablet Commonly known as: BENICAR Take 20 mg by mouth daily.   traMADol 50 MG tablet Commonly known as: ULTRAM Take 1 tablet by mouth 2 (two) times daily as needed.        Outstanding Labs/Studies   BMP at follow up   Duration of Discharge Encounter   Greater than 30 minutes including physician time.  SignedLeanor Kail, PA 05/10/2021, 10:00 AM  I have examined the patient and reviewed assessment and plan and discussed with patient.  Agree with above as stated.  Stable access site.  No femoral hematoma. Stopped Amiodarone.  No further VT like he had prior to PCI.  Will plan for monitor at home.  He is anxious to go home as he helps his wife after her recent fall and jaw injury.     Larae Grooms

## 2021-05-09 NOTE — Progress Notes (Unsigned)
Enrolled patient for a 14 day Zio AT monitor to be mailed to patients home.  

## 2021-05-09 NOTE — Progress Notes (Signed)
Pt being discharged home with family, IV and tele removed, all belongings sent with pt. All d/c instructions and medication instruction s given and explained to pt. All questions and concerns addressed with pt.

## 2021-05-09 NOTE — Progress Notes (Signed)
Discharge home today

## 2021-05-09 NOTE — Progress Notes (Addendum)
Progress Note  Patient Name: Shawn Bauer [facility] Date of Encounter: 05/09/2021  [facility] Cardiologist: Sherren Mocha, MD   Subjective   Wants to go home to take care of wife.  No recurrent chest pain.  Will ambulate this morning.  Telemetry with frequent PVCs.  Inpatient Medications    Scheduled Meds:  aspirin  81 mg Oral Daily   atorvastatin  40 mg Oral Daily   budesonide  0.5 mg Inhalation BID   clopidogrel  75 mg Oral Q breakfast   melatonin  3 mg Oral QHS   metoprolol tartrate  12.5 mg Oral BID   sodium chloride flush  3 mL Intravenous Q12H   Continuous Infusions:  sodium chloride     amiodarone 30 mg/hr (05/09/21 0011)   PRN Meds: sodium chloride, acetaminophen, ondansetron (ZOFRAN) IV, oxyCODONE, sodium chloride flush, traMADol   Vital Signs    Vitals:   05/08/21 1947 05/08/21 2112 05/08/21 2352 05/09/21 0417  BP: (!) 142/72 129/65 127/74 (!) 120/58  Pulse: 77 85 79 77  Resp: '17  19 12  ' Temp: 98.2 F (36.8 C)  98.3 F (36.8 C) 98.3 F (36.8 C)  TempSrc: Oral  Oral Oral  SpO2: 99%  99% 95%  Weight:      Height:        Intake/Output Summary (Last 24 hours) at 05/09/2021 0811 Last data filed at 05/08/2021 2323 Gross per 24 hour  Intake 565.48 ml  Output 1125 ml  Net -559.52 ml   Last 3 Weights 05/08/2021 05/08/2021 04/03/2021  Weight (lbs) 140 lb 140 lb 14 oz 140 lb 12.8 oz  Weight (kg) 63.504 kg 63.9 kg 63.866 kg      Telemetry    Sinus rhythm at controlled ventricular rate, frequent PVC, ventricular bigeminy- Personally Reviewed  ECG    Sinus rhythm, prolonged PR interval- Personally Reviewed  Physical Exam   GEN: No acute distress.   Neck: No JVD Cardiac: RRR, no murmurs, rubs, or gallops.  Right groin cath site without hematoma or ecchymosis. Respiratory: Clear to auscultation bilaterally. GI: Soft, nontender, non-distended  MS: No edema; No deformity. Neuro:  Nonfocal  Psych: Normal affect   Labs    High Sensitivity Troponin:    Recent Labs  Lab 05/07/21 1814 05/07/21 2300 05/08/21 0645  TROPONINIHS 18* 44* 39*      Chemistry Recent Labs  Lab 05/07/21 1814 05/08/21 0645 05/09/21 0306  NA 138 140 140  K 4.0 3.6 4.3  CL 105 111 108  CO2 '27 24 26  ' GLUCOSE 144* 103* 140*  BUN '20 18 16  ' CREATININE 1.79* 1.59* 1.65*  CALCIUM 9.0 7.7* 8.7*  GFRNONAA 37* 43* 41*  ANIONGAP '6 5 6     ' Hematology Recent Labs  Lab 05/07/21 1814 05/08/21 1055  WBC 10.7* 8.8  RBC 4.07* 4.06*  HGB 12.8* 12.7*  HCT 40.2 40.1  MCV 98.8 98.8  MCH 31.4 31.3  MCHC 31.8 31.7  RDW 12.7 12.8  PLT 213 183    Radiology    CARDIAC CATHETERIZATION  Result Date: 05/08/2021   Occlusion of saphenous vein graft to right coronary   Patent saphenous vein graft to diagonal   Patent free left radial to circumflex   Patent LIMA to LAD   Total occlusion of proximal circumflex   99% distal left main   Total occlusion of proximal to mid LAD   Native RCA heavily calcified with ostial to distal disease.  Focal 90% stenosis within a diffusely diseased  segment felt to be culprit for angina.  TIMI grade III flow noted.   Successful high risk RCA PCI complicated by balloon rupture and spiral dissection treated with overlapping 2.5 mm drug-eluting stents 12 mm x 2.5 distal overlap with a 22 x 2.5 mm from distal to mid vessel.  TIMI grade III flow.  Residual stenosis at the overlap site approximately 20%. Aspirin and Plavix for at least 12 months then monotherapy with Plavix thereafter. Bivalirudin for 2 hours before discontinuation.  Half dose infusion. Preventive therapy per treating team. At risk for development of femoral bleed.  Needs to be followed closely since sheath will not be pulled in the Cath Lab.   ECHOCARDIOGRAM COMPLETE  Result Date: 05/08/2021    ECHOCARDIOGRAM REPORT   Patient Name:   Shawn Bauer [facility] Date of Exam: 05/08/2021 Medical Rec #:  [id number]      Height:       71.0 in Accession #:    [id number]     Weight:       140.9 lb Date of  Birth:  Nov 24, 1933     BSA:          1.817 m Patient Age:    87 years       BP:           122/66 mmHg Patient Gender: M              HR:           76 bpm. Exam Location:  Inpatient Procedure: 2D Echo, Cardiac Doppler and Color Doppler Indications:    Ventricular tachycardia  History:        Patient has prior history of Echocardiogram examinations, most                 recent 04/04/2020. CAD, Prior CABG, Arrythmias:Atrial                 Fibrillation and ablation, Signs/Symptoms:Chest Pain and CKD;                 Risk Factors:Hypertension.  Sonographer:    Dustin Flock RDCS Referring Phys: Gal[place]o  1. Left ventricular ejection fraction, by estimation, is 55 to 60%. The left ventricle has normal function. The left ventricle demonstrates global hypokinesis. There is mild concentric left ventricular hypertrophy. Left ventricular diastolic parameters are consistent with Grade I diastolic dysfunction (impaired relaxation).  2. Right ventricular systolic function is normal. The right ventricular size is normal. There is normal pulmonary artery systolic pressure.  3. The mitral valve is grossly normal. No evidence of mitral valve regurgitation. No evidence of mitral stenosis.  4. The aortic valve is tricuspid. Aortic valve regurgitation is not visualized. No aortic stenosis is present.  5. The inferior vena cava is normal in size with greater than 50% respiratory variability, suggesting right atrial pressure of 3 mmHg. Comparison(s): A prior study was performed on 04/04/2020. PVCs more frequent during exam. FINDINGS  Left Ventricle: Left ventricular ejection fraction, by estimation, is 55 to 60%. The left ventricle has normal function. The left ventricle demonstrates global hypokinesis. The left ventricular internal cavity size was normal in size. There is mild concentric left ventricular hypertrophy. Left ventricular diastolic parameters are consistent with Grade I diastolic dysfunction  (impaired relaxation). Right Ventricle: The right ventricular size is normal. No increase in right ventricular wall thickness. Right ventricular systolic function is normal. There is normal pulmonary artery systolic pressure. The tricuspid regurgitant velocity is 2.12 m/s,  and  with an assumed right atrial pressure of 3 mmHg, the estimated right ventricular systolic pressure is 82.5 mmHg. Left Atrium: Left atrial size was normal in size. Right Atrium: Right atrial size was normal in size. Pericardium: There is no evidence of pericardial effusion. Mitral Valve: The mitral valve is grossly normal. No evidence of mitral valve regurgitation. No evidence of mitral valve stenosis. Tricuspid Valve: The tricuspid valve is grossly normal. Tricuspid valve regurgitation is trivial. Aortic Valve: The aortic valve is tricuspid. Aortic valve regurgitation is not visualized. No aortic stenosis is present. Pulmonic Valve: The pulmonic valve was not well visualized. Pulmonic valve regurgitation is mild. No evidence of pulmonic stenosis. Aorta: The aortic root is normal in size and structure and the ascending aorta was not well visualized. Venous: The inferior vena cava is normal in size with greater than 50% respiratory variability, suggesting right atrial pressure of 3 mmHg. IAS/Shunts: The atrial septum is grossly normal.  LEFT VENTRICLE PLAX 2D LVIDd:         4.10 cm  Diastology LVIDs:         3.30 cm  LV e' medial:    6.20 cm/s LV PW:         1.20 cm  LV E/e' medial:  12.9 LV IVS:        1.20 cm  LV e' lateral:   8.81 cm/s LVOT diam:     2.10 cm  LV E/e' lateral: 9.1 LV SV:         51 LV SV Index:   28 LVOT Area:     3.46 cm  RIGHT VENTRICLE RV Basal diam:  3.20 cm RV S prime:     3.92 cm/s TAPSE (M-mode): 1.5 cm LEFT ATRIUM             Index       RIGHT ATRIUM           Index LA diam:        3.60 cm 1.98 cm/m  RA Area:     19.40 cm LA Vol (A2C):   30.1 ml 16.57 ml/m RA Volume:   54.00 ml  29.72 ml/m LA Vol (A4C):   41.0 ml  22.57 ml/m LA Biplane Vol: 38.3 ml 21.08 ml/m  AORTIC VALVE LVOT Vmax:   64.60 cm/s LVOT Vmean:  41.400 cm/s LVOT VTI:    0.148 m  AORTA Ao Root diam: 3.30 cm MITRAL VALVE               TRICUSPID VALVE MV Area (PHT): 3.93 cm    TR Peak grad:   18.0 mmHg MV Decel Time: 193 msec    TR Vmax:        212.00 cm/s MV E velocity: 80.10 cm/s MV A velocity: 66.40 cm/s  SHUNTS MV E/A ratio:  1.21        Systemic VTI:  0.15 m                            Systemic Diam: 2.10 cm Rudean Haskell MD Electronically signed by Rudean Haskell MD Signature Date/Time: 05/08/2021/1:24:14 PM    Final     Cardiac Studies   CORONARY STENT INTERVENTION  05/08/21  LEFT HEART CATH AND CORS/GRAFTS ANGIOGRAPHY   Conclusion      Occlusion of saphenous vein graft to right coronary   Patent saphenous vein graft to diagonal   Patent free left radial to circumflex  Patent LIMA to LAD   Total occlusion of proximal circumflex   99% distal left main   Total occlusion of proximal to mid LAD   Native RCA heavily calcified with ostial to distal disease.  Focal 90% stenosis within a diffusely diseased segment felt to be culprit for angina.  TIMI grade III flow noted.   Successful high risk RCA PCI complicated by balloon rupture and spiral dissection treated with overlapping 2.5 mm drug-eluting stents 12 mm x 2.5 distal overlap with a 22 x 2.5 mm from distal to mid vessel.  TIMI grade III flow.  Residual stenosis at the overlap site approximately 20%.   Aspirin and Plavix for at least 12 months then monotherapy with Plavix thereafter. Bivalirudin for 2 hours before discontinuation.  Half dose infusion. Preventive therapy per treating team. At risk for development of femoral bleed.  Needs to be followed closely since sheath will not be pulled in the Cath Lab.  Diagnostic Dominance: Right Intervention  Implants      Echo 05/08/21  1. Left ventricular ejection fraction, by estimation, is 55 to 60%. The  left ventricle  has normal function. The left ventricle demonstrates global  hypokinesis. There is mild concentric left ventricular hypertrophy. Left  ventricular diastolic parameters  are consistent with Grade I diastolic dysfunction (impaired relaxation).   2. Right ventricular systolic function is normal. The right ventricular  size is normal. There is normal pulmonary artery systolic pressure.   3. The mitral valve is grossly normal. No evidence of mitral valve  regurgitation. No evidence of mitral stenosis.   4. The aortic valve is tricuspid. Aortic valve regurgitation is not  visualized. No aortic stenosis is present.   5. The inferior vena cava is normal in size with greater than 50%  respiratory variability, suggesting right atrial pressure of 3 mmHg.   Patient Profile     87 y.o. male with history of CAD s/p CABG in 2001, hypertension, hyperlipidemia, atrial flutter s/p ablation by Dr. Lovena Le, chronic kidney disease stage III and multiple myeloma presented for chest pain and dizziness evaluation.  Assessment & Plan    Chest pain with mildly elevated troponin/CAD -High-sensitivity troponin 14>>44>>39.  His chest pain concerning for angina.  He was having frequent nonsustained VT.  Treated with IV heparin.  Cardiac catheterization showed occluded SVG to RCA and heavily calcified native RCA.  S/p successful high risk RCA PCI complicated by balloon rupture and spiral dissection treated with overlapping 2.5 mm drug-eluting stents 12 mm x 2.5 distal overlap with a 22 x 2.5 mm from distal to mid vessel.  TIMI grade III flow.  Residual stenosis at the overlap site approximately 20%. Plan for aspirin and Plavix for at least 12 months then monotherapy with Plavix thereafter.  No recurrent chest pain.  Echocardiogram showed LV function of 55 to 60% and grade 1 diastolic dysfunction.  Will ambulate with cardiac rehab this morning.   2. NSVT/ PVCS -Patient was having numerous runs of monomorphic nonsustained VT  on arrival.  He is started on IV amiodarone.  Still having frequent PVCs.  Nonsustained VT has improved. -Currently on metoprolol 12.5 mg twice daily, transition to Toprol-XL 25 mg daily (he was on Toprol-XL 25 mg twice daily at home) -Continue IV amiodarone for now>>Will review plan with MD.>>?  EP eval while here versus switch to p.o. amiodarone -Magnesium 2.3 -Potassium above 4  3.  CKD stage III -Creatinine stable at 1.5-1.6 range  4.  Hypertension -Blood pressure stable on  beta-blocker -Home Benicar on hold  5. HLD -05/08/2021: Cholesterol 114; HDL 35; LDL Cholesterol 69; Triglycerides 48; VLDL 10  - Continue statin   For questions or updates, please contact [place] Please consult [email or web address] for contact info under        Signed, Leanor Kail, PA  05/09/2021, 8:11 AM      I have examined the patient and reviewed assessment and plan and discussed with patient.  Agree with above as stated.  Stopped Amiodarone.  No further VT like he had prior to PCI.  Will plan for monitor at home.  He is anxious to go home as he helps his wife after her recent fall and jaw injury.    Larae Grooms

## 2021-05-09 NOTE — Progress Notes (Signed)
CARDIAC REHAB PHASE I   PRE:  Rate/Rhythm: 55 SR with PVCs  BP:  Sitting: 123/60      SaO2: 99 RA  MODE:  Ambulation: 150 ft   POST:  Rate/Rhythm: 110 ST with PVCs  BP:  Sitting: 158/69    SaO2: 94 RA   Pt ambulated 18f in hallway assist of one with front wheel walker and gait belt. Pt denies CP, SOB, or dizziness. Does endorse leg weakness. Stressed importance of safety and calling for help. Pt returned to recliner, breakfast order placed. Pt educated on importance of ASA and Plavix. Pt given heart healthy diet. Reviewed site care, restrictions, and exercise guidelines. Will refer to CRP II [place]. Pt anxious to go home to help care for wife who has recently fallen.  [id number] Falco RN BSN 05/09/2021 9:12 AM

## 2021-05-09 NOTE — Plan of Care (Signed)
Pt being discharged home with family

## 2021-05-10 DIAGNOSIS — I214 Non-ST elevation (NSTEMI) myocardial infarction: Secondary | ICD-10-CM

## 2021-05-12 ENCOUNTER — Other Ambulatory Visit (HOSPITAL_COMMUNITY): Payer: Self-pay

## 2021-05-12 ENCOUNTER — Telehealth (HOSPITAL_COMMUNITY): Payer: Self-pay | Admitting: Student-PharmD

## 2021-05-12 DIAGNOSIS — I472 Ventricular tachycardia: Secondary | ICD-10-CM | POA: Diagnosis not present

## 2021-05-12 DIAGNOSIS — I493 Ventricular premature depolarization: Secondary | ICD-10-CM | POA: Diagnosis not present

## 2021-05-12 NOTE — Telephone Encounter (Signed)
Pharmacy Transitions of Care Follow-up Telephone Call  Date of discharge: 05-09-2021  Discharge Diagnosis: NSTEMI  How have you been since you were released from the hospital?  Doing well and has no concerns or questions about medications.   Medication changes made at discharge:  START taking: atorvastatin (LIPITOR)  clopidogrel (PLAVIX)         STOP taking: lovastatin 40 MG tablet (MEVACOR)   Medication changes verified by the patient? Yes   Medication Accessibility:  [facility] [address] in [place]    Was the patient provided with refills on discharged medications? Yes (11 for Plavix, 3 for Lipitor)  Have all prescriptions been transferred from [facility] to home pharmacy? Yes   Is the patient able to afford medications? Patient has Tax inspector    Medication Review:  CLOPIDOGREL (PLAVIX) Clopidogrel 75 mg once daily with breakfast - Educated patient on expected duration of therapy of 12 months with clopidogrel with ASA 81 mg, then clopidogrel monotherapy.  - Advised patient of medications to avoid (NSAIDs, ASA)  - Educated that Tylenol (acetaminophen) will be the preferred analgesic to prevent risk of bleeding  - Emphasized importance of monitoring for signs and symptoms of bleeding (abnormal bruising, prolonged bleeding, nose bleeds, bleeding from gums, discolored urine, black tarry stools)  - Advised patient to alert all providers of anticoagulation therapy prior to starting a new medication or having a procedure   Follow-up Appointments:  PCP Hospital f/u appt confirmed? None scheduled  Specialist Hospital f/u appt confirmed?  Scheduled to see Dr. Kathlen Mody on 06/19/2021 @ Cardiology.   If their condition worsens, is the pt aware to call PCP or go to the Emergency Dept.? Yes  Final Patient Assessment: Patient has recently discharged 3 days ago. Patient is taking new medications appropriately and has also been taking ASA '81mg'$  daily as well.  [facility] [place].

## 2021-05-16 ENCOUNTER — Inpatient Hospital Stay (HOSPITAL_BASED_OUTPATIENT_CLINIC_OR_DEPARTMENT_OTHER): Payer: Medicare Other | Admitting: Hematology & Oncology

## 2021-05-16 ENCOUNTER — Encounter: Payer: Self-pay | Admitting: Hematology & Oncology

## 2021-05-16 ENCOUNTER — Inpatient Hospital Stay: Payer: Medicare Other

## 2021-05-16 ENCOUNTER — Other Ambulatory Visit: Payer: Self-pay

## 2021-05-16 ENCOUNTER — Telehealth: Payer: Self-pay

## 2021-05-16 VITALS — BP 142/60 | HR 73 | Temp 98.9°F | Resp 18 | Ht 71.0 in | Wt 139.0 lb

## 2021-05-16 DIAGNOSIS — C9000 Multiple myeloma not having achieved remission: Secondary | ICD-10-CM

## 2021-05-16 DIAGNOSIS — Z5111 Encounter for antineoplastic chemotherapy: Secondary | ICD-10-CM | POA: Diagnosis present

## 2021-05-16 LAB — CMP (CANCER CENTER ONLY)
ALT: 15 U/L (ref 0–44)
AST: 15 U/L (ref 15–41)
Albumin: 3.7 g/dL (ref 3.5–5.0)
Alkaline Phosphatase: 44 U/L (ref 38–126)
Anion gap: 6 (ref 5–15)
BUN: 17 mg/dL (ref 8–23)
CO2: 31 mmol/L (ref 22–32)
Calcium: 9.5 mg/dL (ref 8.9–10.3)
Chloride: 102 mmol/L (ref 98–111)
Creatinine: 1.68 mg/dL — ABNORMAL HIGH (ref 0.61–1.24)
GFR, Estimated: 40 mL/min — ABNORMAL LOW (ref >60)
Glucose, Bld: 104 mg/dL — ABNORMAL HIGH (ref 70–99)
Potassium: 3.8 mmol/L (ref 3.5–5.1)
Sodium: 139 mmol/L (ref 135–145)
Total Bilirubin: 0.8 mg/dL (ref 0.3–1.2)
Total Protein: 6.1 g/dL — ABNORMAL LOW (ref 6.5–8.1)

## 2021-05-16 LAB — CBC WITH DIFFERENTIAL (CANCER CENTER ONLY)
Abs Immature Granulocytes: 0.04 10*3/uL (ref 0.00–0.07)
Basophils Absolute: 0.1 10*3/uL (ref 0.0–0.1)
Basophils Relative: 1 %
Eosinophils Absolute: 0.4 10*3/uL (ref 0.0–0.5)
Eosinophils Relative: 5 %
HCT: 38.1 % — ABNORMAL LOW (ref 39.0–52.0)
Hemoglobin: 12.5 g/dL — ABNORMAL LOW (ref 13.0–17.0)
Immature Granulocytes: 1 %
Lymphocytes Relative: 27 %
Lymphs Abs: 2.4 10*3/uL (ref 0.7–4.0)
MCH: 32.1 pg (ref 26.0–34.0)
MCHC: 32.8 g/dL (ref 30.0–36.0)
MCV: 97.7 fL (ref 80.0–100.0)
Monocytes Absolute: 0.9 10*3/uL (ref 0.1–1.0)
Monocytes Relative: 10 %
Neutro Abs: 5 10*3/uL (ref 1.7–7.7)
Neutrophils Relative %: 56 %
Platelet Count: 229 10*3/uL (ref 150–400)
RBC: 3.9 MIL/uL — ABNORMAL LOW (ref 4.22–5.81)
RDW: 12.8 % (ref 11.5–15.5)
WBC Count: 8.8 10*3/uL (ref 4.0–10.5)
nRBC: 0 % (ref 0.0–0.2)

## 2021-05-16 LAB — LACTATE DEHYDROGENASE: LDH: 184 U/L (ref 98–192)

## 2021-05-16 MED ORDER — DEXAMETHASONE 4 MG PO TABS
20.0000 mg | ORAL_TABLET | ORAL | Status: DC
  Administered 2021-05-16: 20 mg via ORAL
  Filled 2021-05-16: qty 5

## 2021-05-16 MED ORDER — BORTEZOMIB CHEMO SQ INJECTION 3.5 MG (2.5MG/ML)
1.3000 mg/m2 | Freq: Once | INTRAMUSCULAR | Status: AC
  Administered 2021-05-16: 2.25 mg via SUBCUTANEOUS
  Filled 2021-05-16: qty 0.9

## 2021-05-16 MED ORDER — PROCHLORPERAZINE MALEATE 10 MG PO TABS
10.0000 mg | ORAL_TABLET | Freq: Once | ORAL | Status: AC
  Administered 2021-05-16: 10 mg via ORAL
  Filled 2021-05-16: qty 1

## 2021-05-16 NOTE — Progress Notes (Signed)
Hematology and Oncology Follow Up Visit  Shawn Bauer [facility] [id number] 06-21-1934 87 y.o. 05/16/2021   Principle Diagnosis:  IgG Kappa myeloma -- high risk due to t(4:21)   Current Therapy:        Velcade/Decadron -- started 10/09/2019, s/p cycle #`18 --treatment frequency changed to every other week on 01/21/2021 Xgeva 120 mg IM q 3 months -- next dose on 12/2020   Interim History: Shawn Bauer is here today for follow-up.  Unfortunately, Shawn Bauer has been quite busy since Shawn Bauer was last here.  Shawn Bauer had chest pain a couple weekends ago.  Shawn Bauer went to the emergency room.  Shawn Bauer was found to have a blockage.  Shawn Bauer had a stent placed.  Shawn Bauer is now on Plavix and aspirin.  The is also on Lipitor.  This past weekend, Shawn Bauer wife fell and broke her jaw.  As such, is busy taking care of her.  Shawn Bauer has a cardiac monitor on him right now.  Thankfully, the myeloma has been holding pretty steady.  Shawn Bauer has had no problem with the myeloma.  Shawn Bauer has been responding as expected.  Back in July, there is no monoclonal spike in Shawn Bauer blood.  Shawn Bauer IgG level was 682 mg/dL.  The Kappa light chain was 19 mg/dL.  Shawn Bauer has had no change in bowel or bladder habits.  Shawn Bauer has had no leg swelling.  Shawn Bauer has had no cough or shortness of breath.  There has been no bleeding.  Overall, Shawn Bauer performance status is ECOG 1.     Medications:  Allergies as of 05/16/2021   No Known Allergies      Medication List        Accurate as of May 16, 2021 10:27 AM. If you have any questions, ask your nurse or doctor.          aspirin EC 81 MG tablet Take 81 mg by mouth daily. Swallow whole.   atorvastatin 40 MG tablet Commonly known as: LIPITOR Take 1 tablet (40 mg total) by mouth daily.   budesonide-formoterol 160-4.5 MCG/ACT inhaler Commonly known as: SYMBICORT Inhale 2 puffs into the lungs 2 (two) times daily.   CINNAMON PO Take 1,000 mg by mouth daily.   clopidogrel 75 MG tablet Commonly known as: PLAVIX Take 1 tablet (75 mg total) by mouth  daily with breakfast.   cyanocobalamin 1000 MCG tablet Take 1,000 mcg by mouth daily.   MELATONIN ER PO Take 1 tablet by mouth daily as needed (For sleep).   metoprolol succinate 50 MG 24 hr tablet Commonly known as: TOPROL-XL Take 25 mg by mouth in the morning and at bedtime.   nitroGLYCERIN 0.4 MG SL tablet Commonly known as: NITROSTAT Place 1 tablet (0.4 mg total) under the tongue every 5 (five) minutes as needed for chest pain.   olmesartan 20 MG tablet Commonly known as: BENICAR Take 20 mg by mouth daily.   traMADol 50 MG tablet Commonly known as: ULTRAM Take 1 tablet by mouth 2 (two) times daily as needed.        Allergies: No Known Allergies  Past Medical History, Surgical history, Social history, and Family History were reviewed and updated.  Review of Systems: Review of Systems  Constitutional: Negative.   HENT: Negative.    Eyes: Negative.   Respiratory: Negative.    Cardiovascular:  Positive for palpitations.  Gastrointestinal: Negative.   Genitourinary: Negative.   Musculoskeletal: Negative.   Skin: Negative.   Neurological: Negative.   Endo/Heme/Allergies: Negative.   Psychiatric/Behavioral: Negative.  Physical Exam:  height is '5\' 11"'$  (1.803 m) and weight is 139 lb (63 kg). Shawn Bauer oral temperature is 98.9 F (37.2 C). Shawn Bauer blood pressure is 142/60 (abnormal) and Shawn Bauer pulse is 73. Shawn Bauer respiration is 18 and oxygen saturation is 100%.   Wt Readings from Last 3 Encounters:  05/16/21 139 lb (63 kg)  05/08/21 140 lb (63.5 kg)  04/03/21 140 lb 12.8 oz (63.9 kg)    Physical Exam Vitals reviewed.  HENT:     Head: Normocephalic and atraumatic.  Eyes:     Pupils: Pupils are equal, round, and reactive to light.  Cardiovascular:     Rate and Rhythm: Normal rate and regular rhythm.     Heart sounds: Normal heart sounds.  Pulmonary:     Effort: Pulmonary effort is normal.     Breath sounds: Normal breath sounds.  Abdominal:     General: Bowel sounds  are normal.     Palpations: Abdomen is soft.  Musculoskeletal:        General: No tenderness or deformity. Normal range of motion.     Cervical back: Normal range of motion.  Lymphadenopathy:     Cervical: No cervical adenopathy.  Skin:    General: Skin is warm and dry.     Findings: No erythema or rash.  Neurological:     Mental Status: Shawn Bauer is alert and oriented to person, place, and time.  Psychiatric:        Behavior: Behavior normal.        Thought Content: Thought content normal.        Judgment: Judgment normal.     Lab Results  Component Value Date   WBC 8.8 05/16/2021   HGB 12.5 (L) 05/16/2021   HCT 38.1 (L) 05/16/2021   MCV 97.7 05/16/2021   PLT 229 05/16/2021   No results found for: FERRITIN, IRON, TIBC, UIBC, IRONPCTSAT Lab Results  Component Value Date   RBC 3.90 (L) 05/16/2021   Lab Results  Component Value Date   KPAFRELGTCHN 189.6 (H) 04/03/2021   LAMBDASER 10.5 04/03/2021   KAPLAMBRATIO 18.06 (H) 04/03/2021   Lab Results  Component Value Date   IGGSERUM 682 04/03/2021   IGA 127 04/03/2021   IGMSERUM 27 04/03/2021   Lab Results  Component Value Date   TOTALPROTELP 5.9 (L) 04/03/2021   ALBUMINELP 3.5 04/03/2021   A1GS 0.2 04/03/2021   A2GS 0.7 04/03/2021   BETS 0.8 04/03/2021   GAMS 0.7 04/03/2021   MSPIKE Not Observed 04/03/2021   SPEI Comment 04/03/2021     Chemistry      Component Value Date/Time   NA 139 05/16/2021 0933   K 3.8 05/16/2021 0933   CL 102 05/16/2021 0933   CO2 31 05/16/2021 0933   BUN 17 05/16/2021 0933   CREATININE 1.68 (H) 05/16/2021 0933      Component Value Date/Time   CALCIUM 9.5 05/16/2021 0933   ALKPHOS 44 05/16/2021 0933   AST 15 05/16/2021 0933   ALT 15 05/16/2021 0933   BILITOT 0.8 05/16/2021 0933       Impression and Plan: Shawn Bauer is a very pleasant 87 yo caucasian gentleman with IgG kappa myeloma.  So far, everything is going nicely with respect to the myeloma.  I hope that Shawn Bauer cardiac status  improves.  Shawn Bauer is doing well with Shawn Bauer protocol.  Shawn Bauer is on treatment every other week.  I think this works well for him.  We will plan to see him back ourselves  in a month.  Shawn Bauer will come back in 2 weeks for treatment.  If, there are any problems, from a cardiac standpoint, I do not see any issues with him having intervention that is necessary.   [name], MD 8/23/202210:27 AM

## 2021-05-16 NOTE — Telephone Encounter (Signed)
Per 05/16/21 los appts have been made and pt to gain sch at [facility] and through tx/avs

## 2021-05-16 NOTE — Patient Instructions (Signed)
Bortezomib injection What is this medication? BORTEZOMIB (bor TEZ oh mib) targets proteins in cancer cells and stops thecancer cells from growing. It treats multiple myeloma and mantle cell lymphoma. This medicine may be used for other purposes; ask your health care provider orpharmacist if you have questions. COMMON BRAND NAME(S): Velcade What should I tell my care team before I take this medication? They need to know if you have any of these conditions: dehydration diabetes (high blood sugar) heart disease liver disease tingling of the fingers or toes or other nerve disorder an unusual or allergic reaction to bortezomib, mannitol, boron, other medicines, foods, dyes, or preservatives pregnant or trying to get pregnant breast-feeding How should I use this medication? This medicine is injected into a vein or under the skin. It is given by ahealth care provider in a hospital or clinic setting. Talk to your health care provider about the use of this medicine in children.Special care may be needed. Overdosage: If you think you have taken too much of this medicine contact apoison control center or emergency room at once. NOTE: This medicine is only for you. Do not share this medicine with others. What if I miss a dose? Keep appointments for follow-up doses. It is important not to miss your dose.Call your health care provider if you are unable to keep an appointment. What may interact with this medication? This medicine may interact with the following medications: ketoconazole rifampin This list may not describe all possible interactions. Give your health care provider a list of all the medicines, herbs, non-prescription drugs, or dietary supplements you use. Also tell them if you smoke, drink alcohol, or use illegaldrugs. Some items may interact with your medicine. What should I watch for while using this medication? Your condition will be monitored carefully while you are receiving  thismedicine. You may need blood work done while you are taking this medicine. You may get drowsy or dizzy. Do not drive, use machinery, or do anything that needs mental alertness until you know how this medicine affects you. Do not stand up or sit up quickly, especially if you are an older patient. Thisreduces the risk of dizzy or fainting spells This medicine may increase your risk of getting an infection. Call your health care provider for advice if you get a fever, chills, sore throat, or other symptoms of a cold or flu. Do not treat yourself. Try to avoid being aroundpeople who are sick. Check with your health care provider if you have severe diarrhea, nausea, and vomiting, or if you sweat a lot. The loss of too much body fluid may make itdangerous for you to take this medicine. Do not become pregnant while taking this medicine or for 7 months after stopping it. Women should inform their health care provider if they wish to become pregnant or think they might be pregnant. Men should not father a child while taking this medicine and for 4 months after stopping it. There is a potential for serious harm to an unborn child. Talk to your health care provider for more information. Do not breast-feed an infant while taking thismedicine or for 2 months after stopping it. This medicine may make it more difficult to get pregnant or father a child.Talk to your health care provider if you are concerned about your fertility. What side effects may I notice from receiving this medication? Side effects that you should report to your doctor or health care professionalas soon as possible: allergic reactions (skin rash; itching or hives; swelling   of the face, lips, or tongue) bleeding (bloody or black, tarry stools; red or dark brown urine; spitting up blood or brown material that looks like coffee grounds; red spots on the skin; unusual bruising or bleeding from the eye, gums, or nose) blurred vision or changes in  vision confusion constipation headache heart failure (trouble breathing; fast, irregular heartbeat; sudden weight gain; swelling of the ankles, feet, hands) infection (fever, chills, cough, sore throat, pain or trouble passing urine) lack or loss of appetite liver injury (dark yellow or brown urine; general ill feeling or flu-like symptoms; loss of appetite, right upper belly pain; yellowing of the eyes or skin) low blood pressure (dizziness; feeling faint or lightheaded, falls; unusually weak or tired) muscle cramps pain, redness, or irritation at site where injected pain, tingling, numbness in the hands or feet seizures trouble breathing unusual bruising or bleeding Side effects that usually do not require medical attention (report to yourdoctor or health care professional if they continue or are bothersome): diarrhea nausea stomach pain trouble sleeping vomiting This list may not describe all possible side effects. Call your doctor for medical advice about side effects. You may report side effects to FDA at1-800-FDA-1088. Where should I keep my medication? This medicine is given in a hospital or clinic. It will not be stored at home. NOTE: This sheet is a summary. It may not cover all possible information. If you have questions about this medicine, talk to your doctor, pharmacist, orhealth care provider.  2022 Elsevier/Gold Standard (2020-09-01 13:22:53)  

## 2021-05-17 ENCOUNTER — Encounter (HOSPITAL_COMMUNITY): Payer: Self-pay | Admitting: Interventional Cardiology

## 2021-05-17 ENCOUNTER — Telehealth (HOSPITAL_COMMUNITY): Payer: Self-pay

## 2021-05-17 LAB — KAPPA/LAMBDA LIGHT CHAINS
Kappa free light chain: 253.4 mg/L — ABNORMAL HIGH (ref 3.3–19.4)
Kappa, lambda light chain ratio: 18.36 — ABNORMAL HIGH (ref 0.26–1.65)
Lambda free light chains: 13.8 mg/L (ref 5.7–26.3)

## 2021-05-17 NOTE — Telephone Encounter (Signed)
Pt insurance is active and benefits verified through Medicare a/b Co-pay 0, DED $233/$233 met, out of pocket 0/0 met, co-insurance 20%. no pre-authorization required. Passport, 05/17/2021_0 :Shawn Bauer, REF# [id number]   2ndary insurance is active and benefits verified through [place]. Co-pay 0, DED 0/0 met, out of pocket 0/0 met, co-insurance 0. No pre-authorization required.   Will contact patient to see if he is interested in the Cardiac Rehab Program. If interested, patient will need to complete follow up appt. Once completed, patient will be contacted for scheduling upon review by the RN Navigator.

## 2021-05-17 NOTE — Telephone Encounter (Signed)
Attempted to call patient in regards to Cardiac Rehab - unable to leave a message

## 2021-05-18 LAB — IGG, IGA, IGM
IgA: 125 mg/dL (ref 61–437)
IgG (Immunoglobin G), Serum: 683 mg/dL (ref 603–1613)
IgM (Immunoglobulin M), Srm: 48 mg/dL (ref 15–143)

## 2021-05-30 ENCOUNTER — Inpatient Hospital Stay: Payer: Medicare Other | Attending: Family

## 2021-05-30 ENCOUNTER — Other Ambulatory Visit: Payer: Self-pay

## 2021-05-30 VITALS — BP 144/45 | HR 41 | Temp 98.0°F | Resp 17

## 2021-05-30 DIAGNOSIS — I472 Ventricular tachycardia: Secondary | ICD-10-CM | POA: Diagnosis not present

## 2021-05-30 DIAGNOSIS — C9000 Multiple myeloma not having achieved remission: Secondary | ICD-10-CM

## 2021-05-30 MED ORDER — PROCHLORPERAZINE MALEATE 10 MG PO TABS
10.0000 mg | ORAL_TABLET | Freq: Once | ORAL | Status: AC
  Administered 2021-05-30: 10 mg via ORAL
  Filled 2021-05-30: qty 1

## 2021-05-30 MED ORDER — BORTEZOMIB CHEMO SQ INJECTION 3.5 MG (2.5MG/ML)
1.3000 mg/m2 | Freq: Once | INTRAMUSCULAR | Status: AC
  Administered 2021-05-30: 2.25 mg via SUBCUTANEOUS
  Filled 2021-05-30: qty 0.9

## 2021-05-30 MED ORDER — DEXAMETHASONE 4 MG PO TABS
20.0000 mg | ORAL_TABLET | ORAL | Status: DC
  Administered 2021-05-30: 20 mg via ORAL

## 2021-05-30 NOTE — Progress Notes (Signed)
Per Dr. Marin Olp ok to proceed with treatment without labs and with heart rate

## 2021-05-30 NOTE — Patient Instructions (Signed)
[place] Discharge Instructions for Patients Receiving Chemotherapy  Today you received the following chemotherapy agents Velcade  To help prevent nausea and vomiting after your treatment, we encourage you to take your nausea medication as prescribed by MD.   If you develop nausea and vomiting that is not controlled by your nausea medication, call the clinic.   BELOW ARE SYMPTOMS THAT SHOULD BE REPORTED IMMEDIATELY: *FEVER GREATER THAN 100.5 F *CHILLS WITH OR WITHOUT FEVER NAUSEA AND VOMITING THAT IS NOT CONTROLLED WITH YOUR NAUSEA MEDICATION *UNUSUAL SHORTNESS OF BREATH *UNUSUAL BRUISING OR BLEEDING TENDERNESS IN MOUTH AND THROAT WITH OR WITHOUT PRESENCE OF ULCERS *URINARY PROBLEMS *BOWEL PROBLEMS UNUSUAL RASH Items with * indicate a potential emergency and should be followed up as soon as possible.  Feel free to call the clinic should you have any questions or concerns. The clinic phone number is [phone].  Please show the [place] at check-in to the Emergency Department and triage nurse.

## 2021-05-31 ENCOUNTER — Other Ambulatory Visit: Payer: Self-pay | Admitting: Student

## 2021-05-31 ENCOUNTER — Other Ambulatory Visit: Payer: Self-pay

## 2021-05-31 ENCOUNTER — Encounter (HOSPITAL_COMMUNITY): Payer: Self-pay | Admitting: Cardiology

## 2021-05-31 ENCOUNTER — Emergency Department (HOSPITAL_COMMUNITY): Payer: Medicare Other

## 2021-05-31 ENCOUNTER — Inpatient Hospital Stay (HOSPITAL_COMMUNITY)
Admission: EM | Admit: 2021-05-31 | Discharge: 2021-06-02 | DRG: 281 | Disposition: A | Discharge status: Home / Self Care | Payer: Medicare Other | Attending: Cardiology | Admitting: Cardiology

## 2021-05-31 DIAGNOSIS — I4892 Unspecified atrial flutter: Secondary | ICD-10-CM | POA: Diagnosis present

## 2021-05-31 DIAGNOSIS — D72829 Elevated white blood cell count, unspecified: Secondary | ICD-10-CM | POA: Diagnosis present

## 2021-05-31 DIAGNOSIS — I25708 Atherosclerosis of coronary artery bypass graft(s), unspecified, with other forms of angina pectoris: Secondary | ICD-10-CM | POA: Diagnosis not present

## 2021-05-31 DIAGNOSIS — R079 Chest pain, unspecified: Secondary | ICD-10-CM

## 2021-05-31 DIAGNOSIS — E785 Hyperlipidemia, unspecified: Secondary | ICD-10-CM | POA: Diagnosis present

## 2021-05-31 DIAGNOSIS — J45909 Unspecified asthma, uncomplicated: Secondary | ICD-10-CM | POA: Diagnosis present

## 2021-05-31 DIAGNOSIS — I4729 Other ventricular tachycardia: Secondary | ICD-10-CM

## 2021-05-31 DIAGNOSIS — I472 Ventricular tachycardia, unspecified: Secondary | ICD-10-CM

## 2021-05-31 DIAGNOSIS — I493 Ventricular premature depolarization: Secondary | ICD-10-CM | POA: Diagnosis present

## 2021-05-31 DIAGNOSIS — I25810 Atherosclerosis of coronary artery bypass graft(s) without angina pectoris: Secondary | ICD-10-CM | POA: Diagnosis present

## 2021-05-31 DIAGNOSIS — Z20822 Contact with and (suspected) exposure to covid-19: Secondary | ICD-10-CM | POA: Diagnosis present

## 2021-05-31 DIAGNOSIS — I1310 Hypertensive heart and chronic kidney disease without heart failure, with stage 1 through stage 4 chronic kidney disease, or unspecified chronic kidney disease: Secondary | ICD-10-CM | POA: Diagnosis present

## 2021-05-31 DIAGNOSIS — E7800 Pure hypercholesterolemia, unspecified: Secondary | ICD-10-CM | POA: Diagnosis present

## 2021-05-31 DIAGNOSIS — I2510 Atherosclerotic heart disease of native coronary artery without angina pectoris: Secondary | ICD-10-CM | POA: Diagnosis present

## 2021-05-31 DIAGNOSIS — Z955 Presence of coronary angioplasty implant and graft: Secondary | ICD-10-CM

## 2021-05-31 DIAGNOSIS — I2582 Chronic total occlusion of coronary artery: Secondary | ICD-10-CM | POA: Diagnosis present

## 2021-05-31 DIAGNOSIS — Z87891 Personal history of nicotine dependence: Secondary | ICD-10-CM

## 2021-05-31 DIAGNOSIS — Z8249 Family history of ischemic heart disease and other diseases of the circulatory system: Secondary | ICD-10-CM

## 2021-05-31 DIAGNOSIS — Z8679 Personal history of other diseases of the circulatory system: Secondary | ICD-10-CM

## 2021-05-31 DIAGNOSIS — R008 Other abnormalities of heart beat: Secondary | ICD-10-CM | POA: Diagnosis present

## 2021-05-31 DIAGNOSIS — Z23 Encounter for immunization: Secondary | ICD-10-CM

## 2021-05-31 DIAGNOSIS — I10 Essential (primary) hypertension: Secondary | ICD-10-CM | POA: Diagnosis present

## 2021-05-31 DIAGNOSIS — N1831 Chronic kidney disease, stage 3a: Secondary | ICD-10-CM | POA: Diagnosis present

## 2021-05-31 DIAGNOSIS — Z79899 Other long term (current) drug therapy: Secondary | ICD-10-CM

## 2021-05-31 DIAGNOSIS — C9000 Multiple myeloma not having achieved remission: Secondary | ICD-10-CM | POA: Diagnosis present

## 2021-05-31 DIAGNOSIS — I214 Non-ST elevation (NSTEMI) myocardial infarction: Secondary | ICD-10-CM

## 2021-05-31 LAB — COMPREHENSIVE METABOLIC PANEL
ALT: 37 U/L (ref 0–44)
AST: 31 U/L (ref 15–41)
Albumin: 3.1 g/dL — ABNORMAL LOW (ref 3.5–5.0)
Alkaline Phosphatase: 40 U/L (ref 38–126)
Anion gap: 10 (ref 5–15)
BUN: 30 mg/dL — ABNORMAL HIGH (ref 8–23)
CO2: 20 mmol/L — ABNORMAL LOW (ref 22–32)
Calcium: 8.3 mg/dL — ABNORMAL LOW (ref 8.9–10.3)
Chloride: 106 mmol/L (ref 98–111)
Creatinine, Ser: 1.95 mg/dL — ABNORMAL HIGH (ref 0.61–1.24)
GFR, Estimated: 33 mL/min — ABNORMAL LOW (ref >60)
Glucose, Bld: 137 mg/dL — ABNORMAL HIGH (ref 70–99)
Potassium: 4.2 mmol/L (ref 3.5–5.1)
Sodium: 136 mmol/L (ref 135–145)
Total Bilirubin: 0.9 mg/dL (ref 0.3–1.2)
Total Protein: 5.5 g/dL — ABNORMAL LOW (ref 6.5–8.1)

## 2021-05-31 LAB — RESP PANEL BY RT-PCR (FLU A&B, COVID) ARPGX2
Influenza A by PCR: NEGATIVE
Influenza B by PCR: NEGATIVE
SARS Coronavirus 2 by RT PCR: NEGATIVE

## 2021-05-31 LAB — CBC WITH DIFFERENTIAL/PLATELET
Abs Immature Granulocytes: 0.11 10*3/uL — ABNORMAL HIGH (ref 0.00–0.07)
Basophils Absolute: 0 10*3/uL (ref 0.0–0.1)
Basophils Relative: 0 %
Eosinophils Absolute: 0 10*3/uL (ref 0.0–0.5)
Eosinophils Relative: 0 %
HCT: 35.4 % — ABNORMAL LOW (ref 39.0–52.0)
Hemoglobin: 11.5 g/dL — ABNORMAL LOW (ref 13.0–17.0)
Immature Granulocytes: 1 %
Lymphocytes Relative: 11 %
Lymphs Abs: 1.7 10*3/uL (ref 0.7–4.0)
MCH: 32.1 pg (ref 26.0–34.0)
MCHC: 32.5 g/dL (ref 30.0–36.0)
MCV: 98.9 fL (ref 80.0–100.0)
Monocytes Absolute: 1 10*3/uL (ref 0.1–1.0)
Monocytes Relative: 7 %
Neutro Abs: 12.4 10*3/uL — ABNORMAL HIGH (ref 1.7–7.7)
Neutrophils Relative %: 81 %
Platelets: 212 10*3/uL (ref 150–400)
RBC: 3.58 MIL/uL — ABNORMAL LOW (ref 4.22–5.81)
RDW: 13.1 % (ref 11.5–15.5)
WBC: 15.3 10*3/uL — ABNORMAL HIGH (ref 4.0–10.5)
nRBC: 0 % (ref 0.0–0.2)

## 2021-05-31 LAB — TROPONIN I (HIGH SENSITIVITY)
Troponin I (High Sensitivity): 19 ng/L — ABNORMAL HIGH (ref <18)
Troponin I (High Sensitivity): 1520 ng/L (ref <18)
Troponin I (High Sensitivity): 2165 ng/L (ref <18)
Troponin I (High Sensitivity): 2237 ng/L (ref <18)

## 2021-05-31 LAB — MAGNESIUM: Magnesium: 2.2 mg/dL (ref 1.7–2.4)

## 2021-05-31 MED ORDER — MELATONIN ER 1 MG PO TBCR: EXTENDED_RELEASE_TABLET | Freq: Every day | ORAL | Status: DC | PRN

## 2021-05-31 MED ORDER — CLOPIDOGREL BISULFATE 75 MG PO TABS
75.0000 mg | ORAL_TABLET | Freq: Every day | ORAL | Status: DC
  Administered 2021-06-01 – 2021-06-02 (×2): 75 mg via ORAL
  Filled 2021-05-31 (×3): qty 1

## 2021-05-31 MED ORDER — MOMETASONE FURO-FORMOTEROL FUM 200-5 MCG/ACT IN AERO: 2.0000 | INHALATION_SPRAY | Freq: Two times a day (BID) | RESPIRATORY_TRACT | Status: DC

## 2021-05-31 MED ORDER — HEPARIN BOLUS VIA INFUSION
3500.0000 [IU] | Freq: Once | INTRAVENOUS | Status: AC
  Administered 2021-05-31: 3500 [IU] via INTRAVENOUS
  Filled 2021-05-31: qty 3500

## 2021-05-31 MED ORDER — NITROGLYCERIN 0.4 MG SL SUBL: 0.4000 mg | SUBLINGUAL_TABLET | SUBLINGUAL | Status: DC | PRN

## 2021-05-31 MED ORDER — ATORVASTATIN CALCIUM 40 MG PO TABS
40.0000 mg | ORAL_TABLET | Freq: Every day | ORAL | Status: DC
  Administered 2021-06-01 – 2021-06-02 (×2): 40 mg via ORAL
  Filled 2021-05-31 (×2): qty 1

## 2021-05-31 MED ORDER — SODIUM CHLORIDE 0.9% FLUSH: 3.0000 mL | INTRAVENOUS | Status: DC | PRN

## 2021-05-31 MED ORDER — SODIUM CHLORIDE 0.9 % IV SOLN
250.0000 mL | INTRAVENOUS | Status: DC | PRN
Start: 2021-05-31 — End: 2021-06-01

## 2021-05-31 MED ORDER — ACETAMINOPHEN 325 MG PO TABS: 650.0000 mg | ORAL_TABLET | ORAL | Status: DC | PRN

## 2021-05-31 MED ORDER — AMIODARONE HCL IN DEXTROSE 360-4.14 MG/200ML-% IV SOLN
30.0000 mg/h | INTRAVENOUS | Status: DC
  Administered 2021-05-31 – 2021-06-02 (×4): 30 mg/h via INTRAVENOUS
  Filled 2021-05-31 (×3): qty 200

## 2021-05-31 MED ORDER — ONDANSETRON HCL 4 MG/2ML IJ SOLN: 4.0000 mg | Freq: Four times a day (QID) | INTRAMUSCULAR | Status: DC | PRN

## 2021-05-31 MED ORDER — HEPARIN (PORCINE) 25000 UT/250ML-% IV SOLN
900.0000 [IU]/h | INTRAVENOUS | Status: DC
  Administered 2021-05-31: 900 [IU]/h via INTRAVENOUS
  Filled 2021-05-31 (×2): qty 250

## 2021-05-31 MED ORDER — HEPARIN SODIUM (PORCINE) 5000 UNIT/ML IJ SOLN: 5000.0000 [IU] | Freq: Three times a day (TID) | INTRAMUSCULAR | Status: DC

## 2021-05-31 MED ORDER — INFLUENZA VAC A&B SA ADJ QUAD 0.5 ML IM PRSY
0.5000 mL | PREFILLED_SYRINGE | INTRAMUSCULAR | Status: AC
  Administered 2021-06-02: 0.5 mL via INTRAMUSCULAR
  Filled 2021-05-31: qty 0.5

## 2021-05-31 MED ORDER — MELATONIN 3 MG PO TABS: 3.0000 mg | ORAL_TABLET | Freq: Every evening | ORAL | Status: DC | PRN

## 2021-05-31 MED ORDER — SODIUM CHLORIDE 0.9% FLUSH
3.0000 mL | Freq: Two times a day (BID) | INTRAVENOUS | Status: DC
Start: 2021-05-31 — End: 2021-06-02
  Administered 2021-05-31 – 2021-06-01 (×2): 3 mL via INTRAVENOUS

## 2021-05-31 MED ORDER — AMIODARONE LOAD VIA INFUSION
150.0000 mg | Freq: Once | INTRAVENOUS | Status: AC
  Administered 2021-05-31: 150 mg via INTRAVENOUS
  Filled 2021-05-31: qty 83.34

## 2021-05-31 MED ORDER — VITAMIN B-12 1000 MCG PO TABS
1000.0000 ug | ORAL_TABLET | Freq: Every day | ORAL | Status: DC
  Administered 2021-06-01 – 2021-06-02 (×2): 1000 ug via ORAL
  Filled 2021-05-31 (×2): qty 1

## 2021-05-31 MED ORDER — ASPIRIN EC 81 MG PO TBEC
81.0000 mg | DELAYED_RELEASE_TABLET | Freq: Every day | ORAL | Status: DC
  Administered 2021-06-02: 81 mg via ORAL
  Filled 2021-05-31: qty 1

## 2021-05-31 MED ORDER — AMIODARONE HCL IN DEXTROSE 360-4.14 MG/200ML-% IV SOLN
60.0000 mg/h | INTRAVENOUS | Status: DC
  Administered 2021-05-31: 60 mg/h via INTRAVENOUS
  Filled 2021-05-31: qty 200

## 2021-05-31 MED ORDER — SODIUM CHLORIDE 0.9 % WEIGHT BASED INFUSION
1.0000 mL/kg/h | INTRAVENOUS | Status: DC
  Administered 2021-05-31 – 2021-06-01 (×2): 1 mL/kg/h via INTRAVENOUS

## 2021-05-31 NOTE — Progress Notes (Signed)
error 

## 2021-05-31 NOTE — Progress Notes (Signed)
   Repeat troponin came back at 1,520. Patient chest pain free. Will trend troponin to peak. Discussed with Dr. Martinique. Will start IV Heparin and plan for cardiac catheterization tomorrow. The patient understands that risks include but are not limited to stroke (1 in 1000), death (1 in 57), kidney failure [usually temporary] (1 in 500), bleeding (1 in 200), allergic reaction [possibly serious] (1 in 200), and agrees to proceed. Patient currently on the board for 06/01/2021 at 13:30 with Dr. Angelena Form. Will place pre-cath orders. Creatinine 1.95 on admission (baseline 1.5 to 1.6) - will start pre-cath fluids tonight at 8pm and recheck BMET in the morning.  Darreld Mclean, PA-C 05/31/2021 4:58 PM

## 2021-05-31 NOTE — Progress Notes (Signed)
Notified by lab patient has critical troponin level of 1520. Sande Rives PA paged and notified. Pt. Denies any pain or discomfort at this time.

## 2021-05-31 NOTE — ED Provider Notes (Signed)
[facility] EMERGENCY DEPARTMENT Provider Note   CSN: [id number] Arrival date & time: 05/31/21  0957     History Chief Complaint  Patient presents with   Chest Pain    Shawn Bauer is a 87 y.o. male.  Patient here with chest pain.  History of atrial fibrillation/atrial flutter/ventricular tachycardia.  Currently on Plavix.  Not on anticoagulation otherwise.  Had a stent placed 3 weeks ago and has been wearing heart patch recently.  That was mailed in.  Has not had chest pain since stent was placed but now chest pain this morning that got better with nitroglycerin x2.  States that he feels like his heart rates been low and fast at times.  Per EMS he has had some runs of nonsustained V. tach.  The history is provided by the patient.  Chest Pain Pain location:  Substernal area Pain quality: pressure   Pain severity:  Mild Onset quality:  Gradual Duration:  3 hours Progression:  Resolved Chronicity:  Recurrent Relieved by:  Nothing Worsened by:  Nothing Associated symptoms: palpitations   Associated symptoms: no abdominal pain, no back pain, no cough, no fever, no shortness of breath and no vomiting   Risk factors: coronary artery disease    HPI: A 87 year old patient with a history of hypertension and hypercholesterolemia presents for evaluation of chest pain. Initial onset of pain was less than one hour ago. The patient's chest pain is not worse with exertion and is relieved by nitroglycerin. The patient's chest pain is middle- or left-sided, is not well-localized, is not described as heaviness/pressure/tightness, is not sharp and does not radiate to the arms/jaw/neck. The patient does not complain of nausea and denies diaphoresis. The patient has no history of stroke, has no history of peripheral artery disease, has not smoked in the past 90 days, denies any history of treated diabetes, has no relevant family history of coronary artery disease (first degree relative  at less than age 53) and does not have an elevated BMI (>=30).   Past Medical History:  Diagnosis Date   Asthma    Atrial fibrillation ([place])    CAD (coronary artery disease)    s/p CABG   Degenerative disk disease    cervical/spinal stenosis   Goals of care, counseling/discussion 09/30/2019   HTN (hypertension)    Hyperlipidemia    Kappa light chain myeloma ([place]) 09/30/2019    Patient Active Problem List   Diagnosis Date Noted   NSVT (nonsustained ventricular tachycardia) ([place]) 05/31/2021   Non-ST elevation (NSTEMI) myocardial infarction ([place]) 05/10/2021   Ventricular tachycardia ([place]) 05/07/2021   Typical atrial flutter ([place]) 03/29/2020   Secondary hypercoagulable state ([place]) 03/29/2020   Kappa light chain myeloma ([place]) 09/30/2019   Goals of care, counseling/discussion 09/30/2019   Abnormal SPEP 09/08/2019   HYPERLIPIDEMIA-MIXED 10/18/2010   HYPERTENSION, BENIGN 10/18/2010   CORONARY ATHEROSLERO AUTOL VEIN BYPASS GRAFT 10/18/2010    Past Surgical History:  Procedure Laterality Date   A-FLUTTER ABLATION N/A 05/23/2020   Procedure: A-FLUTTER ABLATION;  Surgeon: Evans Lance, MD;  Location: [place] [facility];  Service: Cardiovascular;  Laterality: N/A;   APPENDECTOMY     BACK SURGERY     bilateral inguinal hernia repair     CORONARY ARTERY BYPASS GRAFT  2001   after positive exercise treadmill test.  LIMA-LAD, Radial artery to LCx marginal, SVG to second diagonal, SVG to PDA   CORONARY STENT INTERVENTION N/A 05/08/2021   Procedure: CORONARY STENT INTERVENTION;  Surgeon:  Belva Crome, MD;  Location: [place] [facility];  Service: Cardiovascular;  Laterality: N/A;   LEFT HEART CATH AND CORS/GRAFTS ANGIOGRAPHY N/A 05/08/2021   Procedure: LEFT HEART CATH AND CORS/GRAFTS ANGIOGRAPHY;  Surgeon: Belva Crome, MD;  Location: [place] [facility];  Service: Cardiovascular;  Laterality: N/A;   TONSILLECTOMY AND ADENOIDECTOMY         Family History  Problem Relation Age of  Onset   Heart attack Father 80       died   Heart attack Brother     Social History   Tobacco Use   Smoking status: Former    Types: Cigarettes    Quit date: 08/31/1964    Years since quitting: 56.7   Smokeless tobacco: Former    Types: Chew    Quit date: 04/24/2016  Vaping Use   Vaping Use: Never used  Substance Use Topics   Alcohol use: No   Drug use: No    Home Medications Prior to Admission medications   Medication Sig Start Date End Date Taking? Authorizing Provider  aspirin EC 81 MG tablet Take 81 mg by mouth daily. Swallow whole.   Yes [provider]  atorvastatin (LIPITOR) 40 MG tablet Take 1 tablet (40 mg total) by mouth daily. 05/09/21  Yes Bhagat, Bhavinkumar, PA  clopidogrel (PLAVIX) 75 MG tablet Take 1 tablet (75 mg total) by mouth daily with breakfast. 05/10/21  Yes Bhagat, Bhavinkumar, PA  metoprolol succinate (TOPROL-XL) 50 MG 24 hr tablet Take 25 mg by mouth in the morning and at bedtime.  12/30/12  Yes Sherren Mocha, MD  olmesartan (BENICAR) 20 MG tablet Take 20 mg by mouth daily. 10/20/18  Yes [provider]  budesonide-formoterol (SYMBICORT) 160-4.5 MCG/ACT inhaler Inhale 2 puffs into the lungs 2 (two) times daily.    [provider]  CINNAMON PO Take 1,000 mg by mouth daily.    [provider]  cyanocobalamin 1000 MCG tablet Take 1,000 mcg by mouth daily.    [provider]  MELATONIN ER PO Take 1 tablet by mouth daily as needed (For sleep).    [provider]  nitroGLYCERIN (NITROSTAT) 0.4 MG SL tablet Place 1 tablet (0.4 mg total) under the tongue every 5 (five) minutes as needed for chest pain. Patient not taking: Reported on 05/16/2021 11/03/19 05/07/21  Richardson Dopp T, PA-C  traMADol (ULTRAM) 50 MG tablet Take 1 tablet by mouth 2 (two) times daily as needed. 01/04/21   [provider]    Allergies    Patient has no known allergies.  Review of Systems   Review of Systems  Constitutional:   Negative for chills and fever.  HENT:  Negative for ear pain and sore throat.   Eyes:  Negative for pain and visual disturbance.  Respiratory:  Negative for cough and shortness of breath.   Cardiovascular:  Positive for chest pain and palpitations.  Gastrointestinal:  Negative for abdominal pain and vomiting.  Genitourinary:  Negative for dysuria and hematuria.  Musculoskeletal:  Negative for arthralgias and back pain.  Skin:  Negative for color change and rash.  Neurological:  Negative for seizures and syncope.  All other systems reviewed and are negative.  Physical Exam Updated Vital Signs BP (!) 116/58   Pulse 80   Temp 97.8 F (36.6 C) (Oral)   Resp 13   SpO2 99%   Physical Exam Vitals and nursing note reviewed.  Constitutional:      Appearance: He is well-developed.  HENT:     Head: Normocephalic and atraumatic.  Eyes:     Conjunctiva/sclera: Conjunctivae normal.     Pupils: Pupils are equal, round, and reactive to light.  Cardiovascular:     Rate and Rhythm: Regular rhythm. Tachycardia present.     Pulses:          Radial pulses are 2+ on the right side and 2+ on the left side.     Heart sounds: Normal heart sounds. No murmur heard. Pulmonary:     Effort: Pulmonary effort is normal. No respiratory distress.     Breath sounds: Normal breath sounds.  Abdominal:     Palpations: Abdomen is soft.     Tenderness: There is no abdominal tenderness.  Musculoskeletal:        General: Normal range of motion.     Cervical back: Normal range of motion and neck supple.     Right lower leg: No edema.     Left lower leg: No edema.  Skin:    General: Skin is warm and dry.  Neurological:     Mental Status: He is alert.    ED Results / Procedures / Treatments   Labs (all labs ordered are listed, but only abnormal results are displayed) Labs Reviewed  CBC WITH DIFFERENTIAL/PLATELET - Abnormal; Notable for the following components:      Result Value   WBC 15.3 (*)    RBC  3.58 (*)    Hemoglobin 11.5 (*)    HCT 35.4 (*)    Neutro Abs 12.4 (*)    Abs Immature Granulocytes 0.11 (*)    All other components within normal limits  COMPREHENSIVE METABOLIC PANEL - Abnormal; Notable for the following components:   CO2 20 (*)    Glucose, Bld 137 (*)    BUN 30 (*)    Creatinine, Ser 1.95 (*)    Calcium 8.3 (*)    Total Protein 5.5 (*)    Albumin 3.1 (*)    GFR, Estimated 33 (*)    All other components within normal limits  TROPONIN I (HIGH SENSITIVITY) - Abnormal; Notable for the following components:   Troponin I (High Sensitivity) 19 (*)    All other components within normal limits  RESP PANEL BY RT-PCR (FLU A&B, COVID) ARPGX2  MAGNESIUM  TROPONIN I (HIGH SENSITIVITY)    EKG EKG Interpretation  Date/Time:  Wednesday May 31 2021 11:49:45 EDT Ventricular Rate:  72 PR Interval:  254 QRS Duration: 104 QT Interval:  387 QTC Calculation: 424 R Axis:   82 Text Interpretation: Sinus rhythm Prolonged PR interval Borderline right axis deviation Confirmed by Lennice Sites ([id number]) on 05/31/2021 11:52:55 AM  Radiology DG Chest Portable 1 View  Result Date: 05/31/2021 CLINICAL DATA:  Chest pain EXAM: PORTABLE CHEST 1 VIEW COMPARISON:  01/21/2009 FINDINGS: Post CABG changes. Heart size is upper limits of normal. Atherosclerotic calcification of the aortic knob. No focal airspace consolidation, pleural effusion, or pneumothorax. IMPRESSION: No active disease. Electronically Signed   By: Davina Poke D.O.   On: 05/31/2021 11:44    Procedures .Critical Care  Date/Time: 05/31/2021 1:47 PM Performed by: Lennice Sites, DO Authorized by: Lennice Sites, DO   Critical care provider statement:    Critical care time (minutes):  35   Critical care was necessary to treat or prevent imminent or life-threatening deterioration of the following conditions:  Cardiac failure   Critical care was time spent personally by me on the following activities:  Blood draw for  specimens, development of treatment plan with patient or surrogate, discussions with primary provider, evaluation of patient's response to treatment, examination of patient, obtaining history from patient or surrogate, ordering and performing treatments and interventions, ordering and review of laboratory studies, ordering and review of radiographic studies, pulse oximetry, re-evaluation of patient's condition and review of old charts   Care discussed with: admitting provider     Medications Ordered in ED Medications  amiodarone (NEXTERONE) 1.8 mg/mL load via infusion 150 mg (150 mg Intravenous Bolus from Bag 05/31/21 1012)    Followed by  amiodarone (NEXTERONE PREMIX) 360-4.14 MG/200ML-% (1.8 mg/mL) IV infusion (60 mg/hr Intravenous Infusion Verify 05/31/21 1212)    Followed by  amiodarone (NEXTERONE PREMIX) 360-4.14 MG/200ML-% (1.8 mg/mL) IV infusion (has no administration in time range)    ED Course  I have reviewed the triage vital signs and the nursing notes.  Pertinent labs & imaging results that were available during my care of the patient were reviewed by me and considered in my medical decision making (see chart for details).    MDM Rules/Calculators/A&P HEAR Score: 5                         Craige Rzepka [facility] is here with chest pain.  Unremarkable vitals except for tachycardia.  Had cardiac stent placed several weeks ago has been chest pain-free since this morning.  Took 2 nitroglycerin with improvement.  Per EMS they noticed runs of nonsustained V. tach.  Upon chart review had suspect the same heart rhythm issues prior to stent placement.  He was on amiodarone during hospital stay but after stent placement he was only having some frequent PVCs and he was discharged to home with metoprolol and wore a Zio patch.  He states there has been some high heart rates and low heart rates.  Has not had any episodes of syncope or lightheadedness.  He is having some runs of nonsustained V. tach on the  monitor or what I suspect are V. tach.  About 8-10 beats several times.  But he is asymptomatic.  We will start amiodarone bolus and infusion and consult cardiology.  Talked with Dr. Martinique who will come and evaluate the patient.  Not sure if these are actually atrial fibrillation but will defer to cardiology but he seems hemodynamically stable on amiodarone and tachycardic episodes seem to be occurring less.  We will get lab work including troponin.  EKG does not show any obvious ischemic changes.  Question whether this is A. fib or nonsustained V. tach.  1:47 PM lab work is overall unremarkable.  Troponin 19.  He appears to have converted back to normal sinus rhythm.  Awaiting cardiology recommendations.  Patient remains hemodynamically stable.  To be admitted to cardiology for further care.  Remains on amiodarone drip.  This chart was dictated using voice recognition software.  Despite best efforts to proofread,  errors can occur which can change the documentation meaning.   Final Clinical Impression(s) / ED Diagnoses Final diagnoses:  V-tach [facility])    Rx / DC Orders ED Discharge Orders     None        Lennice Sites, DO 05/31/21 1348

## 2021-05-31 NOTE — Progress Notes (Signed)
ANTICOAGULATION CONSULT NOTE - Initial Consult  Pharmacy Consult for IV Heparin Indication: chest pain/ACS  No Known Allergies  Patient Measurements: Height: '5\' 11"'$  (180.3 cm) Weight: 64.2 kg (141 lb 8.6 oz) IBW/kg (Calculated) : 75.3 Heparin Dosing Weight: 64.2 kg  Vital Signs: Temp: 97.8 F (36.6 C) (09/07 1514) Temp Source: Oral (09/07 1514) BP: 133/53 (09/07 1514) Pulse Rate: 113 (09/07 1514)  Labs: Recent Labs    05/31/21 1015 05/31/21 1435  HGB 11.5*  --   HCT 35.4*  --   PLT 212  --   CREATININE 1.95*  --   TROPONINIHS 19* 1,520*    Estimated Creatinine Clearance: 25.6 mL/min (A) (by C-G formula based on SCr of 1.95 mg/dL (H)).   Medical History: Past Medical History:  Diagnosis Date   Asthma    Atrial fibrillation (H[facility])    CAD (coronary artery disease)    s/p CABG   Degenerative disk disease    cervical/spinal stenosis   Goals of care, counseling/discussion 09/30/2019   HTN (hypertension)    Hyperlipidemia    Kappa light chain myeloma ([place]) 09/30/2019    Medications:  Infusions:   amiodarone 30 mg/hr (05/31/21 1620)   heparin      Assessment: 87 yo male with hx of afib s/p ablation, not on anticoagulation PTA.  Now admitted with chest pain, pharmacy asked to start IV heparin for r/o ACS.  Baseline CBC WNL.  Goal of Therapy:  Heparin level 0.3-0.7 units/ml Monitor platelets by anticoagulation protocol: Yes   Plan:  Start IV heparin with bolus of 3500 units x 1. Then start heparin gtt at 900 units/hr. Check heparin level in 8 hrs Daily heparin level and CBC.   [place] Crane, Roylene Reason, BCCP Clinical Pharmacist  05/31/2021 4:46 PM   [facility] pharmacy phone numbers are listed on [email or web address]

## 2021-05-31 NOTE — ED Triage Notes (Signed)
Patient BIB [facility] for chest pain. EMS reports multiple runs of asymptomatic vtach en route. Patient alert, oriented, and in no apparent distress at this time.  Patient reports sudden onset of chest pain while washing dishes this morning at which point he sat down and took two expired nitroglycerin five minutes apart from each other, feeling relief after the second dose.

## 2021-05-31 NOTE — H&P (Addendum)
Cardiology Admission History and Physical:   Patient ID: Shawn Bauer MRN: [id number]; DOB: Sep 10, 1934   Admission date: 05/31/2021  PCP:  [name], MD   [facility] Providers Cardiologist:  Sherren Mocha, MD   Chief Complaint:  chest pain   Patient Profile:   Shawn Bauer is a 87 y.o. male with a history of CAD s/ p remote CABG in 2001 with recent NSTEMI on 05/07/2021 s/p high risk PCI of RCA complicated by balloon rupture and spiral dissection treated with overlapping DES stenting x2 from distal to mid vessel, non-sustained VT, PVCs, atrial flutter s/p ablation in 04/2020 hypertension, hyperlipidemia, CKD stage III, multiple myeloma, and remote tobacco abuse (quit in 1965) who presented with chest pain and was found to have frequent runs of non-sustained VT.  History of Present Illness:   Shawn Bauer 87 year old male with the above history who is followed by Dr. Burt Knack and Dr. Lovena Le.  He was recently admitted from 05/07/2021 to 05/09/2021 with chest pain and mildly elevated troponin.  He was noted to have frequent runs of nonsustained VT.  Cardiac catheterization showed occluded SVG to RCA and heavily calcified native RCA.  He underwent high risk PCI of the RCA which was complicated by balloon rupture and spiral distal section.  This was treated with 2 overlapping DES stents with residual stenosis at the overlap site of about 20%.  Her chest pain after this.  Plan was for dual antiplatelet therapy with aspirin and Plavix for 12 months.  Echo during admission showed LVEF of 55 to 60% and grade 1 diastolic dysfunction.  He was initially started on IV amiodarone with improvement in his nonsustained VT but he continued to have frequent PVCs.  Amiodarone was eventually discontinued and he was started on Metoprolol.  He was discharged on Toprol-XL 25 mg twice daily and outpatient ZIO monitor was ordered at discharge.  Patient returns to the ED today via [facility] for evaluation of chest  pain. Patient states he was doing well since discharge with no recurrent pain until this morning when he developed substernal nonradiating chest pain that he describes as a dullness while washing dishes.  He states this pain felt similar to the pain he had prior to his recent non-STEMI but less severe.  He took 2 doses of sublingual nitro (although he states this was expired) as well as a full dose of aspirin 365 mg with some mild improvement but no full relief.  Therefore 911 was called.  He denies any associated symptoms with this -no shortness of breath, diaphoresis, nausea, vomiting.  He does have some chronic shortness of breath if he overexerts himself due to his asthma but this is stable.  He states he was moving branches from a fall injury yesterday in his yard and got significantly short of breath and fatigued with this but has otherwise been in his usual state of health.  No orthopnea, PND, lower extremity edema.  At his oncology office yesterday and his heart rates were in the 30s; however, I suspect this was due to bigeminy PVCs.  No lightheadedness, dizziness, syncope.  He is compliant with all of his medications including DAPT and denies any abnormal bleeding.  Per EMS report he had multiple runs of asymptomatic VT in route to the ED.  Upon arrival to the ED, patient mildly hypertensive.  Initial EKG shows multiple runs of nonsustained VT.  Difficult to assess underlying rhythm but looks like sinus. WBC 15.3, Hgb 11.5, Plts 212. Na  136, K 4.2, Glucose 137, BUN 30, Cr 1.95. Albumin 3.1; otherwise, LFTs normal. All other labs including high-sensitivity troponin pending at this time.  Has been started on IV Amiodarone and Cardiology has been asked to see.  At the time of this evaluation, patient is resting comfortably in no acute distress.  He is chest pain-free at this time.  The IV amiodarone has helped the nonsustained VT but he is still having frequent PVCs in a bigeminy pattern.  He is  asymptomatic with this.  PVCs are not palpable and pulse is in the 40s but heart rate on telemetry monitor is in the 80s to 90s.  Of note, patient states he wore his Zio monitor as instructed and his wife is going to mail that back in today.  Past Medical History:  Diagnosis Date   Asthma    Atrial fibrillation ([place])    CAD (coronary artery disease)    s/p CABG   Degenerative disk disease    cervical/spinal stenosis   Goals of care, counseling/discussion 09/30/2019   HTN (hypertension)    Hyperlipidemia    Kappa light chain myeloma ([place]) 09/30/2019    Past Surgical History:  Procedure Laterality Date   A-FLUTTER ABLATION N/A 05/23/2020   Procedure: A-FLUTTER ABLATION;  Surgeon: Evans Lance, MD;  Location: [place] [facility];  Service: Cardiovascular;  Laterality: N/A;   APPENDECTOMY     BACK SURGERY     bilateral inguinal hernia repair     CORONARY ARTERY BYPASS GRAFT  2001   after positive exercise treadmill test.  LIMA-LAD, Radial artery to LCx marginal, SVG to second diagonal, SVG to PDA   CORONARY STENT INTERVENTION N/A 05/08/2021   Procedure: CORONARY STENT INTERVENTION;  Surgeon: Belva Crome, MD;  Location: [place] [facility];  Service: Cardiovascular;  Laterality: N/A;   LEFT HEART CATH AND CORS/GRAFTS ANGIOGRAPHY N/A 05/08/2021   Procedure: LEFT HEART CATH AND CORS/GRAFTS ANGIOGRAPHY;  Surgeon: Belva Crome, MD;  Location: [place] [facility];  Service: Cardiovascular;  Laterality: N/A;   TONSILLECTOMY AND ADENOIDECTOMY       Medications Prior to Admission: Prior to Admission medications   Medication Sig Start Date End Date Taking? Authorizing Provider  aspirin EC 81 MG tablet Take 81 mg by mouth daily. Swallow whole.    [provider]  atorvastatin (LIPITOR) 40 MG tablet Take 1 tablet (40 mg total) by mouth daily. 05/09/21   Bhagat, Crista Luria, PA  budesonide-formoterol (SYMBICORT) 160-4.5 MCG/ACT inhaler Inhale 2 puffs into the lungs 2 (two) times daily.     [provider]  CINNAMON PO Take 1,000 mg by mouth daily.    [provider]  clopidogrel (PLAVIX) 75 MG tablet Take 1 tablet (75 mg total) by mouth daily with breakfast. 05/10/21   Bhagat, Bhavinkumar, PA  cyanocobalamin 1000 MCG tablet Take 1,000 mcg by mouth daily.    [provider]  MELATONIN ER PO Take 1 tablet by mouth daily as needed (For sleep).    [provider]  metoprolol succinate (TOPROL-XL) 50 MG 24 hr tablet Take 25 mg by mouth in the morning and at bedtime.  12/30/12   Sherren Mocha, MD  nitroGLYCERIN (NITROSTAT) 0.4 MG SL tablet Place 1 tablet (0.4 mg total) under the tongue every 5 (five) minutes as needed for chest pain. Patient not taking: Reported on 05/16/2021 11/03/19 05/07/21  Richardson Dopp T, PA-C  olmesartan (BENICAR) 20 MG tablet Take 20 mg by mouth daily. 10/20/18  [provider]  traMADol (ULTRAM) 50 MG tablet Take 1 tablet by mouth 2 (two) times daily as needed. 01/04/21   [provider]     Allergies:   No Known Allergies  Social History:   Social History   Socioeconomic History   Marital status: Married    Spouse name: Not on file   Number of children: 3   Years of education: Not on file   Highest education level: Not on file  Occupational History   Occupation: Shawn [employer]  Tobacco Use   Smoking status: Former    Types: Cigarettes    Quit date: 08/31/1964    Years since quitting: 56.7   Smokeless tobacco: Former    Types: Chew    Quit date: 04/24/2016  Vaping Use   Vaping Use: Never used  Substance and Sexual Activity   Alcohol use: No   Drug use: No   Sexual activity: Not on file  Other Topics Concern   Not on file  Social History Narrative   Not on file   Social Determinants of Health   Financial Resource Strain: Not on file  Food Insecurity: Not on file  Transportation Needs: Not on file  Physical Activity: Not on file  Stress: Not on file  Social Connections: Not on  file  Intimate Partner Violence: Not on file    Family History:   The patient's family history includes Heart attack in his brother; Heart attack (age of onset: 72) in his father.    ROS:  Please see the history of present illness. Review of Systems  Constitutional:  Negative for chills and fever.  HENT:  Negative for congestion.   Respiratory:  Positive for shortness of breath. Negative for cough and hemoptysis.   Cardiovascular:  Positive for chest pain. Negative for palpitations, orthopnea, leg swelling and PND.  Gastrointestinal:  Negative for blood in stool, melena, nausea and vomiting.  Genitourinary:  Negative for hematuria.  Musculoskeletal:  Negative for myalgias.  Neurological:  Negative for dizziness and loss of consciousness.  Endo/Heme/Allergies:  Does not bruise/bleed easily.  Psychiatric/Behavioral:  Substance abuse: remote tobacco use.      Physical Exam/Data:   Vitals:   05/31/21 1115 05/31/21 1130 05/31/21 1145 05/31/21 1200  BP: (!) 109/50 (!) 110/50 110/62 (!) 116/58  Pulse: (!) 37 60 74 80  Resp: '13 12 14 13  ' Temp:      TempSrc:      SpO2: 100% 100% 99% 99%    Intake/Output Summary (Last 24 hours) at 05/31/2021 1301 Last data filed at 05/31/2021 1212 Gross per 24 hour  Intake 129.8 ml  Output --  Net 129.8 ml   Last 3 Weights 05/16/2021 05/08/2021 05/08/2021  Weight (lbs) 139 lb 140 lb 140 lb 14 oz  Weight (kg) 63.05 kg 63.504 kg 63.9 kg     There is no height or weight on file to calculate BMI.  General: 87 y.o. Caucasian male resting comfortably in no acute distress. HEENT: Normocephalic and atraumatic. Sclera clear.  Neck: Supple. No carotid bruits. No JVD. Heart: RRR (although PVC are not noted on auscultation or palpation of radial pulse). Distinct S1 and S2. No murmurs, gallops, or rubs.  Lungs: No increased work of breathing. Clear to ausculation bilaterally. No wheezes, rhonchi, or rales.  Abdomen: Soft, non-distended, and non-tender to  palpation. Bowel sounds present. Extremities: No lower extremity edema.    Skin: Warm and dry. Neuro: Alert and oriented x3. No focal deficits. Psych: Normal  affect. Responds appropriately.  EKG:  Initial EKG was personally reviewed and showed multiple runs of nonsustained VT.  Difficult to assess underlying rhythm but looks like sinus. Repeat EKG showed normal sinus rhythm, rate 72 bpm, with 1st degree AV block. No acute ST/T changes. PVC and non-sustained VT resolved.  Relevant CV Studies:  Echocardiogram 05/08/2021: Impressions:  1. Left ventricular ejection fraction, by estimation, is 55 to 60%. The  left ventricle has normal function. The left ventricle demonstrates global  hypokinesis. There is mild concentric left ventricular hypertrophy. Left  ventricular diastolic parameters  are consistent with Grade I diastolic dysfunction (impaired relaxation).   2. Right ventricular systolic function is normal. The right ventricular  size is normal. There is normal pulmonary artery systolic pressure.   3. The mitral valve is grossly normal. No evidence of mitral valve  regurgitation. No evidence of mitral stenosis.   4. The aortic valve is tricuspid. Aortic valve regurgitation is not  visualized. No aortic stenosis is present.   5. The inferior vena cava is normal in size with greater than 50%  respiratory variability, suggesting right atrial pressure of 3 mmHg.   Comparison(s): A prior study was performed on 04/04/2020. PVCs more  frequent during exam. _______________  Left Cardiac Catheterization 05/08/2021:   Occlusion of saphenous vein graft to right coronary   Patent saphenous vein graft to diagonal   Patent free left radial to circumflex   Patent LIMA to LAD   Total occlusion of proximal circumflex   99% distal left main   Total occlusion of proximal to mid LAD   Native RCA heavily calcified with ostial to distal disease.  Focal 90% stenosis within a diffusely diseased segment  felt to be culprit for angina.  TIMI grade III flow noted.   Successful high risk RCA PCI complicated by balloon rupture and spiral dissection treated with overlapping 2.5 mm drug-eluting stents 12 mm x 2.5 distal overlap with a 22 x 2.5 mm from distal to mid vessel.  TIMI grade III flow.  Residual stenosis at the overlap site approximately 20%.   Aspirin and Plavix for at least 12 months then monotherapy with Plavix thereafter. Bivalirudin for 2 hours before discontinuation.  Half dose infusion. Preventive therapy per treating team. At risk for development of femoral bleed.  Needs to be followed closely since sheath will not be pulled in the Cath Lab. Diagnostic Dominance: Right Intervention      Laboratory Data:  High Sensitivity Troponin:   Recent Labs  Lab 05/07/21 1814 05/07/21 2300 05/08/21 0645 05/31/21 1015  TROPONINIHS 18* 44* 39* 19*      Chemistry Recent Labs  Lab 05/31/21 1015  NA 136  K 4.2  CL 106  CO2 20*  GLUCOSE 137*  BUN 30*  CREATININE 1.95*  CALCIUM 8.3*  GFRNONAA 33*  ANIONGAP 10    Recent Labs  Lab 05/31/21 1015  PROT 5.5*  ALBUMIN 3.1*  AST 31  ALT 37  ALKPHOS 40  BILITOT 0.9   Hematology Recent Labs  Lab 05/31/21 1015  WBC 15.3*  RBC 3.58*  HGB 11.5*  HCT 35.4*  MCV 98.9  MCH 32.1  MCHC 32.5  RDW 13.1  PLT 212   BNPNo results for input(s): BNP, PROBNP in the last 168 hours.  DDimer No results for input(s): DDIMER in the last 168 hours.   Radiology/Studies:  DG Chest Portable 1 View  Result Date: 05/31/2021 CLINICAL DATA:  Chest pain EXAM: PORTABLE CHEST 1 VIEW COMPARISON:  01/21/2009 FINDINGS: Post CABG changes. Heart size is upper limits of normal. Atherosclerotic calcification of the aortic knob. No focal airspace consolidation, pleural effusion, or pneumothorax. IMPRESSION: No active disease. Electronically Signed   By: Davina Poke D.O.   On: 05/31/2021 11:44     Assessment and Plan:   Chest Pain History of  CAD with Recent NSTEMI -Patient presented with chest pain that resolved with nitro.  He was recently admitted for non-STEMI about 3 weeks ago and underwent complex PCI to the RCA complicated by balloon rupture and spiral dissection.  This was treated with 2 overlapping DES stents. - EKG shows frequent runs of nonsustained VT. No acute ST/T changes. - Initial high-sensitivity troponin borderline positive at 19. Repeat pending. - Patient is currently chest pain-free. - Continue DAPT with aspirin and Plavix. - Continue home beta-blocker and high intensity statin. - Do not suspect new ischemic event at this time. Will discuss with MD about whether repeat cardiac cath is needed.  Non-Sustained VT -Had nonsustained VT during recent admission for non-STEMI and was initially placed on IV Amiodarone with improvement.  Amiodarone was ultimately stopped and patient was discharged on beta-blocker.  Patient presented with multiple short runs of nonsustained VT (longest run about 13 beats) and frequent PVCs and bigeminy pattern. - Potassium 4.2 and Magnesium 4.2. - Started on IV Amiodarone in the ED with improvement in non-sustained VT but still having bigeminy PVCs. Continue. - Continue home Toprol XL 25 mg twice daily. - Do not suspect new ischemic evaluation. May need EP consult.  History of Atrial Flutter s/p Ablation in 04/2020 - Underlying rhythm difficult to assess on EKG but looks like sinus on telemetry.  - Continue beta-blocker as above. - No longer on anticoagulation.   Hypertension  - BP soft at time but stable.  - Continue Toprol-XL as above.   Hyperlipidemia - LDL 69 on 05/08/2021. - Continue Lipitor 40m daily.  Acute on CKD Stage III - Creatinine 1.95 on admission. Baseline creatinine around 1.5 to 1.6.  - Will hold Olmesartan for now given soft BP at time. - Recheck BMET in the morning.  Leukocytosis - WBC 15.3 on admission.  - Afebrile and no obvious signs of infection. - Chest  x-ray pending. - Possibly reactive leukocytosis. Will continue to monitor.  Multiple Myeloma - Followed by Oncology as outpatient and being treated with Velcade.   Risk Assessment/Risk Scores:   HEAR Score (for undifferentiated chest pain):  HEAR Score: 5{  Severity of Illness: The appropriate patient status for this patient is OBSERVATION. Observation status is judged to be reasonable and necessary in order to provide the required intensity of service to ensure the patient's safety. The patient's presenting symptoms, physical exam findings, and initial radiographic and laboratory data in the context of their medical condition is felt to place them at decreased risk for further clinical deterioration. Furthermore, it is anticipated that the patient will be medically stable for discharge from the hospital within 2 midnights of admission. The following factors support the patient status of observation.   " The patient's presenting symptoms include chest pain. " The physical exam findings include non-sustained VT and frequent PVCs. " The initial radiographic and laboratory data as above.  For questions or updates, please contact CSattleyPlease consult [email or web address] for contact info under     Signed, CDarreld Mclean PA-C  05/31/2021 1:01 PM

## 2021-06-01 ENCOUNTER — Encounter (HOSPITAL_COMMUNITY)
Admission: EM | Disposition: A | Discharge status: Home / Self Care | Payer: Self-pay | Source: Home / Self Care | Attending: Cardiology

## 2021-06-01 DIAGNOSIS — I214 Non-ST elevation (NSTEMI) myocardial infarction: Secondary | ICD-10-CM

## 2021-06-01 DIAGNOSIS — C9000 Multiple myeloma not having achieved remission: Secondary | ICD-10-CM | POA: Diagnosis present

## 2021-06-01 DIAGNOSIS — I2510 Atherosclerotic heart disease of native coronary artery without angina pectoris: Secondary | ICD-10-CM

## 2021-06-01 DIAGNOSIS — Z23 Encounter for immunization: Secondary | ICD-10-CM | POA: Diagnosis present

## 2021-06-01 DIAGNOSIS — I493 Ventricular premature depolarization: Secondary | ICD-10-CM | POA: Diagnosis present

## 2021-06-01 DIAGNOSIS — Z8679 Personal history of other diseases of the circulatory system: Secondary | ICD-10-CM | POA: Diagnosis not present

## 2021-06-01 DIAGNOSIS — Z79899 Other long term (current) drug therapy: Secondary | ICD-10-CM | POA: Diagnosis not present

## 2021-06-01 DIAGNOSIS — I4892 Unspecified atrial flutter: Secondary | ICD-10-CM | POA: Diagnosis present

## 2021-06-01 DIAGNOSIS — N1831 Chronic kidney disease, stage 3a: Secondary | ICD-10-CM | POA: Diagnosis present

## 2021-06-01 DIAGNOSIS — E7800 Pure hypercholesterolemia, unspecified: Secondary | ICD-10-CM | POA: Diagnosis present

## 2021-06-01 DIAGNOSIS — I25810 Atherosclerosis of coronary artery bypass graft(s) without angina pectoris: Secondary | ICD-10-CM | POA: Diagnosis present

## 2021-06-01 DIAGNOSIS — J45909 Unspecified asthma, uncomplicated: Secondary | ICD-10-CM | POA: Diagnosis present

## 2021-06-01 DIAGNOSIS — I10 Essential (primary) hypertension: Secondary | ICD-10-CM | POA: Diagnosis not present

## 2021-06-01 DIAGNOSIS — I472 Ventricular tachycardia: Secondary | ICD-10-CM | POA: Diagnosis present

## 2021-06-01 DIAGNOSIS — Z8249 Family history of ischemic heart disease and other diseases of the circulatory system: Secondary | ICD-10-CM | POA: Diagnosis not present

## 2021-06-01 DIAGNOSIS — Z87891 Personal history of nicotine dependence: Secondary | ICD-10-CM | POA: Diagnosis not present

## 2021-06-01 DIAGNOSIS — I1310 Hypertensive heart and chronic kidney disease without heart failure, with stage 1 through stage 4 chronic kidney disease, or unspecified chronic kidney disease: Secondary | ICD-10-CM | POA: Diagnosis present

## 2021-06-01 DIAGNOSIS — Z20822 Contact with and (suspected) exposure to covid-19: Secondary | ICD-10-CM | POA: Diagnosis present

## 2021-06-01 DIAGNOSIS — Z955 Presence of coronary angioplasty implant and graft: Secondary | ICD-10-CM | POA: Diagnosis not present

## 2021-06-01 DIAGNOSIS — I2582 Chronic total occlusion of coronary artery: Secondary | ICD-10-CM | POA: Diagnosis present

## 2021-06-01 DIAGNOSIS — R008 Other abnormalities of heart beat: Secondary | ICD-10-CM | POA: Diagnosis present

## 2021-06-01 DIAGNOSIS — D72829 Elevated white blood cell count, unspecified: Secondary | ICD-10-CM | POA: Diagnosis present

## 2021-06-01 HISTORY — PX: LEFT HEART CATH AND CORONARY ANGIOGRAPHY: CATH118249

## 2021-06-01 LAB — CBC
HCT: 34.4 % — ABNORMAL LOW (ref 39.0–52.0)
Hemoglobin: 11.7 g/dL — ABNORMAL LOW (ref 13.0–17.0)
MCH: 32.6 pg (ref 26.0–34.0)
MCHC: 34 g/dL (ref 30.0–36.0)
MCV: 95.8 fL (ref 80.0–100.0)
Platelets: 161 10*3/uL (ref 150–400)
RBC: 3.59 MIL/uL — ABNORMAL LOW (ref 4.22–5.81)
RDW: 13.2 % (ref 11.5–15.5)
WBC: 14.5 10*3/uL — ABNORMAL HIGH (ref 4.0–10.5)
nRBC: 0 % (ref 0.0–0.2)

## 2021-06-01 LAB — BASIC METABOLIC PANEL
Anion gap: 8 (ref 5–15)
BUN: 28 mg/dL — ABNORMAL HIGH (ref 8–23)
CO2: 20 mmol/L — ABNORMAL LOW (ref 22–32)
Calcium: 8.2 mg/dL — ABNORMAL LOW (ref 8.9–10.3)
Chloride: 110 mmol/L (ref 98–111)
Creatinine, Ser: 1.92 mg/dL — ABNORMAL HIGH (ref 0.61–1.24)
GFR, Estimated: 34 mL/min — ABNORMAL LOW (ref >60)
Glucose, Bld: 147 mg/dL — ABNORMAL HIGH (ref 70–99)
Potassium: 4 mmol/L (ref 3.5–5.1)
Sodium: 138 mmol/L (ref 135–145)

## 2021-06-01 LAB — POCT ACTIVATED CLOTTING TIME: Activated Clotting Time: 144 seconds

## 2021-06-01 LAB — HEPARIN LEVEL (UNFRACTIONATED)
Heparin Unfractionated: 0.4 IU/mL (ref 0.30–0.70)
Heparin Unfractionated: 0.4 IU/mL (ref 0.30–0.70)

## 2021-06-01 SURGERY — LEFT HEART CATH AND CORONARY ANGIOGRAPHY
Anesthesia: LOCAL

## 2021-06-01 MED ORDER — SODIUM CHLORIDE 0.9% FLUSH
3.0000 mL | Freq: Two times a day (BID) | INTRAVENOUS | Status: DC
  Administered 2021-06-01 – 2021-06-02 (×2): 3 mL via INTRAVENOUS

## 2021-06-01 MED ORDER — ONDANSETRON HCL 4 MG/2ML IJ SOLN: 4.0000 mg | Freq: Four times a day (QID) | INTRAMUSCULAR | Status: DC | PRN

## 2021-06-01 MED ORDER — HEPARIN (PORCINE) IN NACL 1000-0.9 UT/500ML-% IV SOLN
INTRAVENOUS | Status: DC | PRN
  Administered 2021-06-01 (×2): 500 mL

## 2021-06-01 MED ORDER — HYDRALAZINE HCL 20 MG/ML IJ SOLN: 10.0000 mg | INTRAMUSCULAR | Status: AC | PRN

## 2021-06-01 MED ORDER — SODIUM CHLORIDE 0.9 % IV SOLN: 250.0000 mL | INTRAVENOUS | Status: DC | PRN

## 2021-06-01 MED ORDER — HEPARIN (PORCINE) IN NACL 1000-0.9 UT/500ML-% IV SOLN
INTRAVENOUS | Status: AC
  Filled 2021-06-01: qty 1000

## 2021-06-01 MED ORDER — CLOPIDOGREL BISULFATE 75 MG PO TABS: 75.0000 mg | ORAL_TABLET | Freq: Every day | ORAL | Status: DC

## 2021-06-01 MED ORDER — ACETAMINOPHEN 325 MG PO TABS: 650.0000 mg | ORAL_TABLET | ORAL | Status: DC | PRN

## 2021-06-01 MED ORDER — ATORVASTATIN CALCIUM 80 MG PO TABS: 80.0000 mg | ORAL_TABLET | Freq: Every day | ORAL | Status: DC

## 2021-06-01 MED ORDER — IOHEXOL 350 MG/ML SOLN
INTRAVENOUS | Status: DC | PRN
  Administered 2021-06-01: 30 mL

## 2021-06-01 MED ORDER — LIDOCAINE HCL (PF) 1 % IJ SOLN
INTRAMUSCULAR | Status: DC | PRN
  Administered 2021-06-01: 25 mL

## 2021-06-01 MED ORDER — MORPHINE SULFATE (PF) 2 MG/ML IV SOLN
2.0000 mg | INTRAVENOUS | Status: DC | PRN
Start: 2021-06-01 — End: 2021-06-02

## 2021-06-01 MED ORDER — SODIUM CHLORIDE 0.9% FLUSH: 3.0000 mL | INTRAVENOUS | Status: DC | PRN

## 2021-06-01 MED ORDER — METOPROLOL SUCCINATE ER 25 MG PO TB24
25.0000 mg | ORAL_TABLET | Freq: Two times a day (BID) | ORAL | Status: DC
  Administered 2021-06-01 (×2): 25 mg via ORAL
  Filled 2021-06-01 (×3): qty 1

## 2021-06-01 MED ORDER — ASPIRIN 81 MG PO CHEW
81.0000 mg | CHEWABLE_TABLET | Freq: Once | ORAL | Status: AC
  Administered 2021-06-01: 81 mg via ORAL
  Filled 2021-06-01: qty 1

## 2021-06-01 MED ORDER — SODIUM CHLORIDE 0.9 % IV SOLN: INTRAVENOUS | Status: AC

## 2021-06-01 MED ORDER — ASPIRIN 81 MG PO CHEW: 81.0000 mg | CHEWABLE_TABLET | Freq: Every day | ORAL | Status: DC

## 2021-06-01 MED ORDER — LIDOCAINE HCL (PF) 1 % IJ SOLN
INTRAMUSCULAR | Status: AC
  Filled 2021-06-01: qty 30

## 2021-06-01 MED ORDER — LABETALOL HCL 5 MG/ML IV SOLN: 10.0000 mg | INTRAVENOUS | Status: AC | PRN

## 2021-06-01 SURGICAL SUPPLY — 8 items
CATH INFINITI 5FR MULTPACK ANG (CATHETERS) ×2 IMPLANT
ELECT DEFIB PAD ADLT CADENCE (PAD) ×2 IMPLANT
KIT HEART LEFT (KITS) ×2 IMPLANT
PACK CARDIAC CATHETERIZATION (CUSTOM PROCEDURE TRAY) ×2 IMPLANT
SHEATH PINNACLE 5F 10CM (SHEATH) ×2 IMPLANT
TRANSDUCER W/STOPCOCK (MISCELLANEOUS) ×2 IMPLANT
TUBING CIL FLEX 10 FLL-RA (TUBING) ×2 IMPLANT
WIRE EMERALD 3MM-J .035X150CM (WIRE) ×2 IMPLANT

## 2021-06-01 NOTE — Consult Note (Addendum)
Cardiology Consultation:   Patient ID: EGON DITTUS MRN: [id number]; DOB: 06-11-1934  Admit date: 05/31/2021 Date of Consult: 06/01/2021  PCP:  [name], MD   [facility] Providers Cardiologist:  Sherren Mocha, MD  EP: Dr. Lovena Le   Patient Profile:   CALIN FANTROY is a 87 y.o. male with a hx of CAD (CABG 7616, angina > complicated PCI to RCA 0/73/71), multiple myeloma ( IgG kappa myelom, current chemo is Velcade/Decadron every other week), HTN, HLD, AFlutter (ablated Aug 2021 by Dr. Lovena Le, off a/c) who is being seen 06/01/2021 for the evaluation of recurrent NSVT at the request of Dr. Martinique.  History of Present Illness:   Mr. Wainer was admitted 05/07/21 with c/o CP called EMS and found in [facility] that was hemodynamically stable treated in route with diltiazem (?). Cardiology cared for him during this admission, felt to be VT noting multe NSVT episodes in the ER that was improved with amiodarone gtt.  Trops  14>>44>>39.  His chest pain concerning for angina.  He was having frequent nonsustained VT > 05/08/21 had cath with occl RCA graft and severe RCA disease, PCI complicated by balloon rupture and dissection and overlapping stents placed DISCHARGED 05/09/21, off amiodarone with monitor  He completed the monitor yesterday, not yet returned  YESTERDAY he developed similar CP to prior admission though not as severe, found his pulse slow, NTG relieved pain. Had been compliant with his meds including DAPT. Found again to have frequent NSVT and started on amiodarone gtt, planned to cycle Trops HS Trops this admission 19 > 1520 > 2165 > 2237 Started on hep gtt and planned for cath He had LHC today with stable findings, no PCI undertaken, patent stents  EP is asked to weigh on his NSVT/PVC burden and management.  LABS K+ 4.2 > 4.0 BUN/Creat 30/1.95 > 28/1.92 (1.5 looks his baseline) Mag 2.2 WBC 14.5 H/H 11/34 Plts 161   He reports that after he went home last admission he felt  really good, felt like he had more energy and less winded.  Stayed feeling well until yesterday, started to have some central CP, similar to his prior but not nearly as bad.  NO palpitations, no lightheadedness, no near syncope or syncope.  He denies ever fainting  Feels well today   Past Medical History:  Diagnosis Date   Asthma    Atrial fibrillation ([place])    CAD (coronary artery disease)    s/p CABG   Degenerative disk disease    cervical/spinal stenosis   Goals of care, counseling/discussion 09/30/2019   HTN (hypertension)    Hyperlipidemia    Kappa light chain myeloma ([place]) 09/30/2019    Past Surgical History:  Procedure Laterality Date   A-FLUTTER ABLATION N/A 05/23/2020   Procedure: A-FLUTTER ABLATION;  Surgeon: Evans Lance, MD;  Location: [place] [facility];  Service: Cardiovascular;  Laterality: N/A;   APPENDECTOMY     BACK SURGERY     bilateral inguinal hernia repair     CORONARY ARTERY BYPASS GRAFT  2001   after positive exercise treadmill test.  LIMA-LAD, Radial artery to LCx marginal, SVG to second diagonal, SVG to PDA   CORONARY STENT INTERVENTION N/A 05/08/2021   Procedure: CORONARY STENT INTERVENTION;  Surgeon: Belva Crome, MD;  Location: [place] [facility];  Service: Cardiovascular;  Laterality: N/A;   LEFT HEART CATH AND CORS/GRAFTS ANGIOGRAPHY N/A 05/08/2021   Procedure: LEFT HEART CATH AND CORS/GRAFTS ANGIOGRAPHY;  Surgeon: Belva Crome, MD;  Location: [place] [facility];  Service: Cardiovascular;  Laterality: N/A;   TONSILLECTOMY AND ADENOIDECTOMY       Home Medications:  Prior to Admission medications   Medication Sig Start Date End Date Taking? Authorizing Provider  aspirin EC 81 MG tablet Take 81 mg by mouth daily. Swallow whole.   Yes [provider]  atorvastatin (LIPITOR) 40 MG tablet Take 1 tablet (40 mg total) by mouth daily. 05/09/21  Yes Bhagat, Bhavinkumar, PA  CINNAMON PO Take 1,000 mg by mouth daily.   Yes [provider]  clopidogrel (PLAVIX) 75 MG tablet Take 1 tablet (75 mg total) by mouth daily with breakfast. 05/10/21  Yes Bhagat, Bhavinkumar, PA  cyanocobalamin 1000 MCG tablet Take 1,000 mcg by mouth daily.   Yes [provider]  metoprolol succinate (TOPROL-XL) 50 MG 24 hr tablet Take 25 mg by mouth in the morning and at bedtime.  12/30/12  Yes Sherren Mocha, MD  nitroGLYCERIN (NITROSTAT) 0.4 MG SL tablet Place 1 tablet (0.4 mg total) under the tongue every 5 (five) minutes as needed for chest pain. 11/03/19 05/31/21 Yes Weaver, Scott T, PA-C  olmesartan (BENICAR) 20 MG tablet Take 20 mg by mouth in the morning. 10/20/18  Yes [provider]  SYMBICORT 160-4.5 MCG/ACT inhaler Inhale 2 puffs into the lungs 2 (two) times daily.   Yes [provider]  traMADol (ULTRAM) 50 MG tablet Take 50 mg by mouth 2 (two) times daily as needed (for pain). 01/04/21  Yes [provider]  budesonide-formoterol (SYMBICORT) 160-4.5 MCG/ACT inhaler Inhale 2 puffs into the lungs 2 (two) times daily. Patient not taking: No sig reported    [provider]    Inpatient Medications: Scheduled Meds:  [MAR Hold] aspirin EC  81 mg Oral Daily   [MAR Hold] atorvastatin  40 mg Oral Daily   [MAR Hold] clopidogrel  75 mg Oral Q breakfast   influenza vaccine adjuvanted  0.5 mL Intramuscular Tomorrow-1000   [MAR Hold] metoprolol succinate  25 mg Oral BID   [MAR Hold] sodium chloride flush  3 mL Intravenous Q12H   [MAR Hold] cyanocobalamin  1,000 mcg Oral Daily   Continuous Infusions:  sodium chloride     sodium chloride     sodium chloride 1 mL/kg/hr (06/01/21 0755)   amiodarone 30 mg/hr (06/01/21 1145)   heparin Stopped (06/01/21 1315)   PRN Meds: sodium chloride, [MAR Hold] acetaminophen, acetaminophen, hydrALAZINE, labetalol, [MAR Hold] melatonin, morphine injection, [MAR Hold] nitroGLYCERIN, [MAR Hold] ondansetron (ZOFRAN) IV, ondansetron (ZOFRAN) IV, sodium chloride flush  Allergies:    No Known Allergies  Social History:   Social History   Socioeconomic History   Marital status: Married    Spouse name: Not on file   Number of children: 3   Years of education: Not on file   Highest education level: Not on file  Occupational History   Occupation: [employer]  Tobacco Use   Smoking status: Former    Types: Cigarettes    Quit date: 08/31/1964    Years since quitting: 56.7   Smokeless tobacco: Former    Types: Chew    Quit date: 04/24/2016  Vaping Use   Vaping Use: Never used  Substance and Sexual Activity   Alcohol use: No   Drug use: No   Sexual activity: Not on file  Other Topics Concern   Not on file  Social History Narrative   Not on file   Social Determinants of Health   Financial  Resource Strain: Not on file  Food Insecurity: Not on file  Transportation Needs: Not on file  Physical Activity: Not on file  Stress: Not on file  Social Connections: Not on file  Intimate Partner Violence: Not on file    Family History:    Family History  Problem Relation Age of Onset   Heart attack Father 63       died   Heart attack Brother      ROS:  Please see the history of present illness.  All other ROS reviewed and negative.     Physical Exam/Data:   Vitals:   06/01/21 1505 06/01/21 1510 06/01/21 1515 06/01/21 1520  BP: (!) 121/57 (!) 122/39 107/65 (!) 141/47  Pulse: 76 70 74 80  Resp: '15 16 19 13  ' Temp:      TempSrc:      SpO2: 97% 96% 96% 97%  Weight:      Height:        Intake/Output Summary (Last 24 hours) at 06/01/2021 1526 Last data filed at 06/01/2021 1526 Gross per 24 hour  Intake 1844.46 ml  Output 280 ml  Net 1564.46 ml   Last 3 Weights 05/31/2021 05/16/2021 05/08/2021  Weight (lbs) 141 lb 8.6 oz 139 lb 140 lb  Weight (kg) 64.2 kg 63.05 kg 63.504 kg     Body mass index is 19.74 kg/m.  General:  Well nourished, well developed, in no acute distress HEENT: normal Lymph: no adenopathy Neck: no JVD Endocrine:  No  thryomegaly Vascular: No carotid bruits  Cardiac: RRR; no murmurs, gallops or rubs Lungs:  CTA b/l, no wheezing, rhonchi or rales  Abd: soft, nontender  Ext: no edema Musculoskeletal:  No deformities, age appropriate atrophy Skin: warm and dry  Neuro:  no focal abnormalities noted Psych:  Normal affect   EKG:  The EKG was personally reviewed and demonstrates:    SR, PVCs and NSVT SR 72bpm (no ectopy), 1st degree Avblock 243m, no ischemic looking changes  Last admission: SR, 1st degree AVblock 2747m V bigemeny SR 82, no ectopy  Telemetry:  Telemetry was personally reviewed and demonstrates:   SR, 1st degree AVblock, initially NSVT episodes were quit frequent, then more intermittent and of late few if any PVCs remains frequent, single PVCs, often bigeminy though these seem to have started to settle as well  Relevant CV Studies:   06/01/21: LHC   Mid LM to Dist LM lesion is 99% stenosed.   Prox LAD lesion is 100% stenosed.   Ost Cx to Prox Cx lesion is 100% stenosed.   Ost RCA to Prox RCA lesion is 60% stenosed.   Mid RCA lesion is 15% stenosed.   Origin lesion is 100% stenosed.   Dist RCA lesion is 40% stenosed.   Non-stenotic Mid RCA to Dist RCA lesion was previously treated.   05/08/21: LHC/PCI   Occlusion of saphenous vein graft to right coronary   Patent saphenous vein graft to diagonal   Patent free left radial to circumflex   Patent LIMA to LAD   Total occlusion of proximal circumflex   99% distal left main   Total occlusion of proximal to mid LAD   Native RCA heavily calcified with ostial to distal disease.  Focal 90% stenosis within a diffusely diseased segment felt to be culprit for angina.  TIMI grade III flow noted.   Successful high risk RCA PCI complicated by balloon rupture and spiral dissection treated with overlapping 2.5 mm drug-eluting stents 12 mm  x 2.5 distal overlap with a 22 x 2.5 mm from distal to mid vessel.  TIMI grade III flow.  Residual stenosis  at the overlap site approximately 20%.  05/08/21: TTE IMPRESSIONS   1. Left ventricular ejection fraction, by estimation, is 55 to 60%. The  left ventricle has normal function. The left ventricle demonstrates global  hypokinesis. There is mild concentric left ventricular hypertrophy. Left  ventricular diastolic parameters  are consistent with Grade I diastolic dysfunction (impaired relaxation).   2. Right ventricular systolic function is normal. The right ventricular  size is normal. There is normal pulmonary artery systolic pressure.   3. The mitral valve is grossly normal. No evidence of mitral valve  regurgitation. No evidence of mitral stenosis.   4. The aortic valve is tricuspid. Aortic valve regurgitation is not  visualized. No aortic stenosis is present.   5. The inferior vena cava is normal in size with greater than 50%  respiratory variability, suggesting right atrial pressure of 3 mmHg.   Comparison(s): A prior study was performed on 04/04/2020. PVCs more  frequent during exam.     Laboratory Data:  High Sensitivity Troponin:   Recent Labs  Lab 05/08/21 0645 05/31/21 1015 05/31/21 1435 05/31/21 1725 05/31/21 1843  TROPONINIHS 39* 19* 1,520* 2,165* 2,237*     Chemistry Recent Labs  Lab 05/31/21 1015 06/01/21 0154  NA 136 138  K 4.2 4.0  CL 106 110  CO2 20* 20*  GLUCOSE 137* 147*  BUN 30* 28*  CREATININE 1.95* 1.92*  CALCIUM 8.3* 8.2*  GFRNONAA 33* 34*  ANIONGAP 10 8    Recent Labs  Lab 05/31/21 1015  PROT 5.5*  ALBUMIN 3.1*  AST 31  ALT 37  ALKPHOS 40  BILITOT 0.9   Hematology Recent Labs  Lab 05/31/21 1015 06/01/21 0154  WBC 15.3* 14.5*  RBC 3.58* 3.59*  HGB 11.5* 11.7*  HCT 35.4* 34.4*  MCV 98.9 95.8  MCH 32.1 32.6  MCHC 32.5 34.0  RDW 13.1 13.2  PLT 212 161   BNPNo results for input(s): BNP, PROBNP in the last 168 hours.  DDimer No results for input(s): DDIMER in the last 168 hours.   Radiology/Studies:   DG Chest Portable 1  View Result Date: 05/31/2021 CLINICAL DATA:  Chest pain EXAM: PORTABLE CHEST 1 VIEW COMPARISON:  01/21/2009 FINDINGS: Post CABG changes. Heart size is upper limits of normal. Atherosclerotic calcification of the aortic knob. No focal airspace consolidation, pleural effusion, or pneumothorax. IMPRESSION: No active disease. Electronically Signed   By: Davina Poke D.O.   On: 05/31/2021 11:44     Assessment and Plan:   NSVT, PVCs Agree, likely the cause of his noted slow HRs at home No syncope, no near syncope  Interestingly his HS Trop started where he was last admission though had a sharp rise to >2000 Cath though with no new dz LVEF was 55-60% by his echo last admission described as having global hypokinesis  Unclear if there is benefit in a new echo  Continue amiodarone gtt with improvement in his NSVT and ectopy burden with plans to transition to PO Titrate BB to BP tolerance I do not think there is a role for ICD at this time  Dr. Lovena Le will see later today    Risk Assessment/Risk Scores:     For questions or updates, please contact [facility] Please consult [email or web address] for contact info under    Signed, Baldwin Jamaica, PA-C  06/01/2021 3:26 PM  EP attending  Patient seen and examined.  Agree with the findings as noted above.  The patient is a very pleasant 87 year old man who is well-known to me with longstanding coronary artery disease status post bypass surgery over 20 years ago, who also had recent PCI of the right coronary artery, complicated by dissection.  The patient has a history of atrial flutter and is status post catheter ablation 1 year ago.  He presented to the hospital with a wide QRS tachycardia which was hemodynamically stable.  He has been treated with amiodarone.  He is improved.  He did have shortness of breath and chest pressure with this tachycardia.  His cardiac catheterization demonstrated that his stents were open and echo demonstrated  preserved LV systolic function.  His exam is notable for a pleasant elderly man in no distress.  Cardiovascular exam revealed a regular rate and rhythm.  The lungs were clear.  The extremities were warm and demonstrated no edema.  Neuro exam was nonfocal.  Review of his EKGs demonstrates sinus rhythm with frequent ventricular ectopy.  I do not see a twelve-lead EKG of sustained VT but there is nonsustained VT.  The morphology is a left bundle branch inferior axis with a QS complex in lead I which could suggest an epicardial focus.  Assessment and plan 1.  Ventricular tachycardia -the patient's advanced age and multiple comorbidities make him a poor candidate for ICD implantation.  In addition he was not particularly symptomatic and he has preserved LV systolic function.  I would recommend amiodarone and continued beta-blocker therapy. 2.  Coronary artery disease -continue medical therapy. 3.  Atrial flutter -he is status post ablation with no recurrent atrial flutter or other atrial arrhythmias.  Cristopher Peru, MD

## 2021-06-01 NOTE — H&P (View-Only) (Signed)
Progress Note  Patient Name: Shawn Bauer [facility] Date of Encounter: 06/01/2021  [facility] Cardiologist: Sherren Mocha, MD   Subjective   No chest pain overnight. Lots of continued ectopy noted on telemetry, but he is asymptomatic. Planned for cardiac cath today.    Inpatient Medications    Scheduled Meds:  aspirin EC  81 mg Oral Daily   atorvastatin  40 mg Oral Daily   clopidogrel  75 mg Oral Q breakfast   influenza vaccine adjuvanted  0.5 mL Intramuscular Tomorrow-1000   sodium chloride flush  3 mL Intravenous Q12H   cyanocobalamin  1,000 mcg Oral Daily   Continuous Infusions:  sodium chloride     sodium chloride 1 mL/kg/hr (05/31/21 2009)   amiodarone 30 mg/hr (06/01/21 0454)   heparin 900 Units/hr (06/01/21 0454)   PRN Meds: sodium chloride, acetaminophen, melatonin, nitroGLYCERIN, ondansetron (ZOFRAN) IV, sodium chloride flush   Vital Signs    Vitals:   05/31/21 1504 05/31/21 1514 05/31/21 1952 06/01/21 0343  BP:  (!) 133/53 131/84 137/63  Pulse:  (!) 113 77 80  Resp:  '19 18 18  ' Temp:  97.8 F (36.6 C) 98 F (36.7 C) 98.1 F (36.7 C)  TempSrc:  Oral Oral Oral  SpO2:  97% 97% 95%  Weight: 64.2 kg     Height: '5\' 11"'  (1.803 m)       Intake/Output Summary (Last 24 hours) at 06/01/2021 0658 Last data filed at 06/01/2021 0454 Gross per 24 hour  Intake 736.27 ml  Output --  Net 736.27 ml   Last 3 Weights 05/31/2021 05/16/2021 05/08/2021  Weight (lbs) 141 lb 8.6 oz 139 lb 140 lb  Weight (kg) 64.2 kg 63.05 kg 63.504 kg      Telemetry    SR with intermittent bigeminy, continued multiple runs of short NSVT - Personally Reviewed  ECG    No new tracing this morning  Physical Exam   GEN: No acute distress.   Neck: No JVD Cardiac: RRR, no murmurs, rubs, or gallops.  Respiratory: Clear to auscultation bilaterally. GI: Soft, nontender, non-distended  MS: No edema; No deformity. Scatter bruising to arms Neuro:  Nonfocal  Psych: Normal affect   Labs    High  Sensitivity Troponin:   Recent Labs  Lab 05/08/21 0645 05/31/21 1015 05/31/21 1435 05/31/21 1725 05/31/21 1843  TROPONINIHS 39* 19* 1,520* 2,165* 2,237*      Chemistry Recent Labs  Lab 05/31/21 1015 06/01/21 0154  NA 136 138  K 4.2 4.0  CL 106 110  CO2 20* 20*  GLUCOSE 137* 147*  BUN 30* 28*  CREATININE 1.95* 1.92*  CALCIUM 8.3* 8.2*  PROT 5.5*  --   ALBUMIN 3.1*  --   AST 31  --   ALT 37  --   ALKPHOS 40  --   BILITOT 0.9  --   GFRNONAA 33* 34*  ANIONGAP 10 8     Hematology Recent Labs  Lab 05/31/21 1015 06/01/21 0154  WBC 15.3* 14.5*  RBC 3.58* 3.59*  HGB 11.5* 11.7*  HCT 35.4* 34.4*  MCV 98.9 95.8  MCH 32.1 32.6  MCHC 32.5 34.0  RDW 13.1 13.2  PLT 212 161    BNPNo results for input(s): BNP, PROBNP in the last 168 hours.   DDimer No results for input(s): DDIMER in the last 168 hours.   Radiology    DG Chest Portable 1 View  Result Date: 05/31/2021 CLINICAL DATA:  Chest pain EXAM: PORTABLE CHEST 1 VIEW COMPARISON:  01/21/2009 FINDINGS: Post CABG changes. Heart size is upper limits of normal. Atherosclerotic calcification of the aortic knob. No focal airspace consolidation, pleural effusion, or pneumothorax. IMPRESSION: No active disease. Electronically Signed   By: Davina Poke D.O.   On: 05/31/2021 11:44    Cardiac Studies   Left Cardiac Catheterization 05/08/2021:   Occlusion of saphenous vein graft to right coronary   Patent saphenous vein graft to diagonal   Patent free left radial to circumflex   Patent LIMA to LAD   Total occlusion of proximal circumflex   99% distal left main   Total occlusion of proximal to mid LAD   Native RCA heavily calcified with ostial to distal disease.  Focal 90% stenosis within a diffusely diseased segment felt to be culprit for angina.  TIMI grade III flow noted.   Successful high risk RCA PCI complicated by balloon rupture and spiral dissection treated with overlapping 2.5 mm drug-eluting stents 12 mm x 2.5  distal overlap with a 22 x 2.5 mm from distal to mid vessel.  TIMI grade III flow.  Residual stenosis at the overlap site approximately 20%.   Aspirin and Plavix for at least 12 months then monotherapy with Plavix thereafter. Bivalirudin for 2 hours before discontinuation.  Half dose infusion. Preventive therapy per treating team. At risk for development of femoral bleed.  Needs to be followed closely since sheath will not be pulled in the Cath Lab. Diagnostic Dominance: Right Intervention      Echo: 05/08/21  IMPRESSIONS     1. Left ventricular ejection fraction, by estimation, is 55 to 60%. The  left ventricle has normal function. The left ventricle demonstrates global  hypokinesis. There is mild concentric left ventricular hypertrophy. Left  ventricular diastolic parameters  are consistent with Grade I diastolic dysfunction (impaired relaxation).   2. Right ventricular systolic function is normal. The right ventricular  size is normal. There is normal pulmonary artery systolic pressure.   3. The mitral valve is grossly normal. No evidence of mitral valve  regurgitation. No evidence of mitral stenosis.   4. The aortic valve is tricuspid. Aortic valve regurgitation is not  visualized. No aortic stenosis is present.   5. The inferior vena cava is normal in size with greater than 50%  respiratory variability, suggesting right atrial pressure of 3 mmHg.   Comparison(s): A prior study was performed on 04/04/2020. PVCs more  frequent during exam.   Patient Profile     87 y.o. male with a history of CAD s/ p remote CABG in 2001 with recent NSTEMI on 05/07/2021 s/p high risk PCI of RCA complicated by balloon rupture and spiral dissection treated with overlapping DES stenting x2 from distal to mid vessel, non-sustained VT, PVCs, atrial flutter s/p ablation in 04/2020 hypertension, hyperlipidemia, CKD stage III, multiple myeloma, and remote tobacco abuse (quit in 1965) who presented with  chest pain and was found to have frequent runs of non-sustained VT.  Assessment & Plan    Chest Pain/History of CAD with Recent NSTEMI: Patient presented with chest pain that resolved with nitro.  He was recently admitted for non-STEMI about 3 weeks ago and underwent complex PCI to the RCA complicated by balloon rupture and spiral dissection.  This was treated with 2 overlapping DES stents. -- hsTn continued to rise this admission, up to 2237, therefore planned for cardiac cath today -- Continue aspirin, Plavix, beta-blocker and high intensity statin.  Non-Sustained VT: Had nonsustained VT during recent admission for non-STEMI and  was initially placed on IV Amiodarone with improvement.  Amiodarone was ultimately stopped and patient was discharged on beta-blocker during recent prior admission.   -- Patient presented with multiple short runs of nonsustained VT. Started on IV amiodarone. Continues to have freq ectopy with runs of short NSVT, PVCs, bigeminy  -- Does not appear the home Toprol XL 25 mg twice daily was continued on admission, restarted this morning -- planned for cardiac cath today, may need EP consult pending results   History of Atrial Flutter s/p Ablation in 04/2020:  -- Continue beta-blocker as above. -- No longer on anticoagulation.    Hypertension: stable -- Continue Toprol-XL as above.    Hyperlipidemia: LDL 69 on 05/08/2021. -- Continue Lipitor 29m daily.   Acute on CKD Stage III: Creatinine 1.95 on admission. Baseline creatinine around 1.5 to 1.6.  -- ARB held on admission -- Cr 1.95>>1.92, receiving pre cath hydration    Leukocytosis: WBC 15.3>>14.5, Afebrile and no obvious signs of infection. CXR negative. -- suspect reactive leukocytosis. Will continue to monitor.   Multiple Myeloma: Followed by Oncology as outpatient and being treated with Velcade.    For questions or updates, please contact [place] consult [email or web address] for contact info under         Signed, LReino Bellis NP  06/01/2021, 6:59 AM

## 2021-06-01 NOTE — Progress Notes (Addendum)
SITE AREA: right femoral/groin  SITE PRIOR TO REMOVAL:  LEVEL 0  PRESSURE APPLIED FOR: approximately 20 minutes  MANUAL: yes  PATIENT STATUS DURING PULL: stable  POST PULL SITE:  LEVEL 0   POST PULL INSTRUCTIONS GIVEN: yes  POST PULL PULSES PRESENT: +1 bilateral pedal pulses  DRESSING APPLIED: gauze with tegaderm  BEDREST BEGINS @ [id number]  COMMENTS: Amiodarone continues infusing at 16.35m/hr='30mg'$ /hr  Area to left lateral mid abdomen noted upon arrival from lab, per report area marked at this site by lab, reddened and slightly bruised, will report to floor rn

## 2021-06-01 NOTE — Progress Notes (Signed)
ANTICOAGULATION CONSULT NOTE   Pharmacy Consult for IV Heparin Indication: chest pain/ACS  No Known Allergies  Patient Measurements: Height: '5\' 11"'$  (180.3 cm) Weight: 64.2 kg (141 lb 8.6 oz) IBW/kg (Calculated) : 75.3 Heparin Dosing Weight: 64.2 kg  Vital Signs: Temp: 98 F (36.7 C) (09/07 1952) Temp Source: Oral (09/07 1952) BP: 131/84 (09/07 1952) Pulse Rate: 77 (09/07 1952)  Labs: Recent Labs    05/31/21 1015 05/31/21 1435 05/31/21 1725 05/31/21 1843 06/01/21 0154  HGB 11.5*  --   --   --  11.7*  HCT 35.4*  --   --   --  34.4*  PLT 212  --   --   --  161  HEPARINUNFRC  --   --   --   --  0.40  CREATININE 1.95*  --   --   --  1.92*  TROPONINIHS 19* 1,520* 2,165* 2,237*  --      Estimated Creatinine Clearance: 26 mL/min (A) (by C-G formula based on SCr of 1.92 mg/dL (H)).   Medical History: Past Medical History:  Diagnosis Date   Asthma    Atrial fibrillation (HCC)    CAD (coronary artery disease)    s/p CABG   Degenerative disk disease    cervical/spinal stenosis   Goals of care, counseling/discussion 09/30/2019   HTN (hypertension)    Hyperlipidemia    Kappa light chain myeloma ([place]) 09/30/2019    Medications:  Infusions:   sodium chloride     sodium chloride 1 mL/kg/hr (05/31/21 2009)   amiodarone 30 mg/hr (05/31/21 2355)   heparin 900 Units/hr (05/31/21 2355)    Assessment: 87 yo male with hx of afib s/p ablation, not on anticoagulation PTA.  Now admitted with chest pain, pharmacy asked to start IV heparin for r/o ACS.  Initial heparin level at goal on 900 units/hr. CBC stable from yesterday. No bleeding issues noted.   Goal of Therapy:  Heparin level 0.3-0.7 units/ml Monitor platelets by anticoagulation protocol: Yes   Plan:  Continue heparin at 900 units/hr Check confirmatory heparin level later this morning.   Erin Hearing PharmD., BCPS Clinical Pharmacist 06/01/2021 2:57 AM

## 2021-06-01 NOTE — Progress Notes (Signed)
Progress Note  Patient Name: Shawn Bauer Date of Encounter: 06/01/2021  [facility] Cardiologist: Sherren Mocha, MD   Subjective   No chest pain overnight. Lots of continued ectopy noted on telemetry, but he is asymptomatic. Planned for cardiac cath today.    Inpatient Medications    Scheduled Meds:  aspirin EC  81 mg Oral Daily   atorvastatin  40 mg Oral Daily   clopidogrel  75 mg Oral Q breakfast   influenza vaccine adjuvanted  0.5 mL Intramuscular Tomorrow-1000   sodium chloride flush  3 mL Intravenous Q12H   cyanocobalamin  1,000 mcg Oral Daily   Continuous Infusions:  sodium chloride     sodium chloride 1 mL/kg/hr (05/31/21 2009)   amiodarone 30 mg/hr (06/01/21 0454)   heparin 900 Units/hr (06/01/21 0454)   PRN Meds: sodium chloride, acetaminophen, melatonin, nitroGLYCERIN, ondansetron (ZOFRAN) IV, sodium chloride flush   Vital Signs    Vitals:   05/31/21 1504 05/31/21 1514 05/31/21 1952 06/01/21 0343  BP:  (!) 133/53 131/84 137/63  Pulse:  (!) 113 77 80  Resp:  '19 18 18  ' Temp:  97.8 F (36.6 C) 98 F (36.7 C) 98.1 F (36.7 C)  TempSrc:  Oral Oral Oral  SpO2:  97% 97% 95%  Weight: 64.2 kg     Height: '5\' 11"'  (1.803 m)       Intake/Output Summary (Last 24 hours) at 06/01/2021 0658 Last data filed at 06/01/2021 0454 Gross per 24 hour  Intake 736.27 ml  Output --  Net 736.27 ml   Last 3 Weights 05/31/2021 05/16/2021 05/08/2021  Weight (lbs) 141 lb 8.6 oz 139 lb 140 lb  Weight (kg) 64.2 kg 63.05 kg 63.504 kg      Telemetry    SR with intermittent bigeminy, continued multiple runs of short NSVT - Personally Reviewed  ECG    No new tracing this morning  Physical Exam   GEN: No acute distress.   Neck: No JVD Cardiac: RRR, no murmurs, rubs, or gallops.  Respiratory: Clear to auscultation bilaterally. GI: Soft, nontender, non-distended  MS: No edema; No deformity. Scatter bruising to arms Neuro:  Nonfocal  Psych: Normal affect   Labs    High  Sensitivity Troponin:   Recent Labs  Lab 05/08/21 0645 05/31/21 1015 05/31/21 1435 05/31/21 1725 05/31/21 1843  TROPONINIHS 39* 19* 1,520* 2,165* 2,237*      Chemistry Recent Labs  Lab 05/31/21 1015 06/01/21 0154  NA 136 138  K 4.2 4.0  CL 106 110  CO2 20* 20*  GLUCOSE 137* 147*  BUN 30* 28*  CREATININE 1.95* 1.92*  CALCIUM 8.3* 8.2*  PROT 5.5*  --   ALBUMIN 3.1*  --   AST 31  --   ALT 37  --   ALKPHOS 40  --   BILITOT 0.9  --   GFRNONAA 33* 34*  ANIONGAP 10 8     Hematology Recent Labs  Lab 05/31/21 1015 06/01/21 0154  WBC 15.3* 14.5*  RBC 3.58* 3.59*  HGB 11.5* 11.7*  HCT 35.4* 34.4*  MCV 98.9 95.8  MCH 32.1 32.6  MCHC 32.5 34.0  RDW 13.1 13.2  PLT 212 161    BNPNo results for input(s): BNP, PROBNP in the last 168 hours.   DDimer No results for input(s): DDIMER in the last 168 hours.   Radiology    DG Chest Portable 1 View  Result Date: 05/31/2021 CLINICAL DATA:  Chest pain EXAM: PORTABLE CHEST 1 VIEW COMPARISON:  01/21/2009 FINDINGS: Post CABG changes. Heart size is upper limits of normal. Atherosclerotic calcification of the aortic knob. No focal airspace consolidation, pleural effusion, or pneumothorax. IMPRESSION: No active disease. Electronically Signed   By: Davina Poke D.O.   On: 05/31/2021 11:44    Cardiac Studies   Left Cardiac Catheterization 05/08/2021:   Occlusion of saphenous vein graft to right coronary   Patent saphenous vein graft to diagonal   Patent free left radial to circumflex   Patent LIMA to LAD   Total occlusion of proximal circumflex   99% distal left main   Total occlusion of proximal to mid LAD   Native RCA heavily calcified with ostial to distal disease.  Focal 90% stenosis within a diffusely diseased segment felt to be culprit for angina.  TIMI grade III flow noted.   Successful high risk RCA PCI complicated by balloon rupture and spiral dissection treated with overlapping 2.5 mm drug-eluting stents 12 mm x 2.5  distal overlap with a 22 x 2.5 mm from distal to mid vessel.  TIMI grade III flow.  Residual stenosis at the overlap site approximately 20%.   Aspirin and Plavix for at least 12 months then monotherapy with Plavix thereafter. Bivalirudin for 2 hours before discontinuation.  Half dose infusion. Preventive therapy per treating team. At risk for development of femoral bleed.  Needs to be followed closely since sheath will not be pulled in the Cath Lab. Diagnostic Dominance: Right Intervention      Echo: 05/08/21  IMPRESSIONS     1. Left ventricular ejection fraction, by estimation, is 55 to 60%. The  left ventricle has normal function. The left ventricle demonstrates global  hypokinesis. There is mild concentric left ventricular hypertrophy. Left  ventricular diastolic parameters  are consistent with Grade I diastolic dysfunction (impaired relaxation).   2. Right ventricular systolic function is normal. The right ventricular  size is normal. There is normal pulmonary artery systolic pressure.   3. The mitral valve is grossly normal. No evidence of mitral valve  regurgitation. No evidence of mitral stenosis.   4. The aortic valve is tricuspid. Aortic valve regurgitation is not  visualized. No aortic stenosis is present.   5. The inferior vena cava is normal in size with greater than 50%  respiratory variability, suggesting right atrial pressure of 3 mmHg.   Comparison(s): A prior study was performed on 04/04/2020. PVCs more  frequent during exam.   Patient Profile     87 y.o. male with a history of CAD s/ p remote CABG in 2001 with recent NSTEMI on 05/07/2021 s/p high risk PCI of RCA complicated by balloon rupture and spiral dissection treated with overlapping DES stenting x2 from distal to mid vessel, non-sustained VT, PVCs, atrial flutter s/p ablation in 04/2020 hypertension, hyperlipidemia, CKD stage III, multiple myeloma, and remote tobacco abuse (quit in 1965) who presented with  chest pain and was found to have frequent runs of non-sustained VT.  Assessment & Plan    Chest Pain/History of CAD with Recent NSTEMI: Patient presented with chest pain that resolved with nitro.  He was recently admitted for non-STEMI about 3 weeks ago and underwent complex PCI to the RCA complicated by balloon rupture and spiral dissection.  This was treated with 2 overlapping DES stents. -- hsTn continued to rise this admission, up to 2237, therefore planned for cardiac cath today -- Continue aspirin, Plavix, beta-blocker and high intensity statin.  Non-Sustained VT: Had nonsustained VT during recent admission for non-STEMI and  was initially placed on IV Amiodarone with improvement.  Amiodarone was ultimately stopped and patient was discharged on beta-blocker during recent prior admission.   -- Patient presented with multiple short runs of nonsustained VT. Started on IV amiodarone. Continues to have freq ectopy with runs of short NSVT, PVCs, bigeminy  -- Does not appear the home Toprol XL 25 mg twice daily was continued on admission, restarted this morning -- planned for cardiac cath today, may need EP consult pending results   History of Atrial Flutter s/p Ablation in 04/2020:  -- Continue beta-blocker as above. -- No longer on anticoagulation.    Hypertension: stable -- Continue Toprol-XL as above.    Hyperlipidemia: LDL 69 on 05/08/2021. -- Continue Lipitor 74m daily.   Acute on CKD Stage III: Creatinine 1.95 on admission. Baseline creatinine around 1.5 to 1.6.  -- ARB held on admission -- Cr 1.95>>1.92, receiving pre cath hydration    Leukocytosis: WBC 15.3>>14.5, Afebrile and no obvious signs of infection. CXR negative. -- suspect reactive leukocytosis. Will continue to monitor.   Multiple Myeloma: Followed by Oncology as outpatient and being treated with Velcade.    For questions or updates, please contact [place]Please consult [email or web address] for contact info under         Signed, LReino Bellis NP  06/01/2021, 6:59 AM

## 2021-06-01 NOTE — Interval H&P Note (Signed)
Cath Lab Visit (complete for each Cath Lab visit)  Clinical Evaluation Leading to the Procedure:   ACS: Yes.    Non-ACS:    Anginal Classification: CCS II  Anti-ischemic medical therapy: Minimal Therapy (1 class of medications)  Non-Invasive Test Results: No non-invasive testing performed  Prior CABG: Previous CABG      History and Physical Interval Note:  06/01/2021 1:45 PM  Reynolds Franchina Freer  has presented today for surgery, with the diagnosis of vt.  The various methods of treatment have been discussed with the patient and family. After consideration of risks, benefits and other options for treatment, the patient has consented to  Procedure(s): LEFT HEART CATH AND CORONARY ANGIOGRAPHY (N/A) as a surgical intervention.  The patient's history has been reviewed, patient examined, no change in status, stable for surgery.  I have reviewed the patient's chart and labs.  Questions were answered to the patient's satisfaction.     Quay Burow

## 2021-06-01 NOTE — Progress Notes (Signed)
ANTICOAGULATION CONSULT NOTE   Pharmacy Consult for IV Heparin Indication: chest pain/ACS  No Known Allergies  Patient Measurements: Height: '5\' 11"'$  (180.3 cm) Weight: 64.2 kg (141 lb 8.6 oz) IBW/kg (Calculated) : 75.3 Heparin Dosing Weight: 64.2 kg  Vital Signs: Temp: 97.9 F (36.6 C) (09/08 1153) Temp Source: Oral (09/08 1153) BP: 119/50 (09/08 1153) Pulse Rate: 84 (09/08 1153)  Labs: Recent Labs    05/31/21 1015 05/31/21 1435 05/31/21 1725 05/31/21 1843 06/01/21 0154 06/01/21 0943  HGB 11.5*  --   --   --  11.7*  --   HCT 35.4*  --   --   --  34.4*  --   PLT 212  --   --   --  161  --   HEPARINUNFRC  --   --   --   --  0.40 0.40  CREATININE 1.95*  --   --   --  1.92*  --   TROPONINIHS 19* 1,520* 2,165* 2,237*  --   --      Estimated Creatinine Clearance: 26 mL/min (A) (by C-G formula based on SCr of 1.92 mg/dL (H)).   Medical History: Past Medical History:  Diagnosis Date   Asthma    Atrial fibrillation (HCC)    CAD (coronary artery disease)    s/p CABG   Degenerative disk disease    cervical/spinal stenosis   Goals of care, counseling/discussion 09/30/2019   HTN (hypertension)    Hyperlipidemia    Kappa light chain myeloma ([place]) 09/30/2019    Medications:  Infusions:   sodium chloride     sodium chloride 1 mL/kg/hr (06/01/21 0755)   amiodarone 30 mg/hr (06/01/21 1145)   heparin 900 Units/hr (06/01/21 0454)    Assessment: 87 yo male with hx of afib s/p ablation, not on anticoagulation PTA.  Now admitted with chest pain, pharmacy asked to start IV heparin for r/o ACS.  Heparin level 0.4 at goal on 900 units/hr. CBC stable  No bleeding issues noted.   Goal of Therapy:  Heparin level 0.3-0.7 units/ml Monitor platelets by anticoagulation protocol: Yes   Plan:  Continue heparin at 900 units/hr Follow up after cath  Daily CBC Heparin level    Curry.D. CPP, BCPS Clinical Pharmacist [phone] 06/01/2021 12:21 PM

## 2021-06-02 ENCOUNTER — Encounter (HOSPITAL_COMMUNITY): Payer: Self-pay | Admitting: Cardiovascular Disease

## 2021-06-02 ENCOUNTER — Other Ambulatory Visit (HOSPITAL_COMMUNITY): Payer: Self-pay

## 2021-06-02 DIAGNOSIS — I10 Essential (primary) hypertension: Secondary | ICD-10-CM

## 2021-06-02 DIAGNOSIS — E7800 Pure hypercholesterolemia, unspecified: Secondary | ICD-10-CM

## 2021-06-02 LAB — BASIC METABOLIC PANEL
Anion gap: 10 (ref 5–15)
BUN: 18 mg/dL (ref 8–23)
CO2: 20 mmol/L — ABNORMAL LOW (ref 22–32)
Calcium: 8.2 mg/dL — ABNORMAL LOW (ref 8.9–10.3)
Chloride: 111 mmol/L (ref 98–111)
Creatinine, Ser: 1.57 mg/dL — ABNORMAL HIGH (ref 0.61–1.24)
GFR, Estimated: 43 mL/min — ABNORMAL LOW (ref >60)
Glucose, Bld: 105 mg/dL — ABNORMAL HIGH (ref 70–99)
Potassium: 3.5 mmol/L (ref 3.5–5.1)
Sodium: 141 mmol/L (ref 135–145)

## 2021-06-02 LAB — CBC
HCT: 32.1 % — ABNORMAL LOW (ref 39.0–52.0)
Hemoglobin: 10.4 g/dL — ABNORMAL LOW (ref 13.0–17.0)
MCH: 31.5 pg (ref 26.0–34.0)
MCHC: 32.4 g/dL (ref 30.0–36.0)
MCV: 97.3 fL (ref 80.0–100.0)
Platelets: 147 10*3/uL — ABNORMAL LOW (ref 150–400)
RBC: 3.3 MIL/uL — ABNORMAL LOW (ref 4.22–5.81)
RDW: 13.5 % (ref 11.5–15.5)
WBC: 6.6 10*3/uL (ref 4.0–10.5)
nRBC: 0 % (ref 0.0–0.2)

## 2021-06-02 MED ORDER — METOPROLOL SUCCINATE ER 50 MG PO TB24
50.0000 mg | ORAL_TABLET | Freq: Two times a day (BID) | ORAL | 11 refills | Status: DC
  Filled 2021-06-02: qty 60, 30d supply, fill #0

## 2021-06-02 MED ORDER — METOPROLOL SUCCINATE ER 50 MG PO TB24
50.0000 mg | ORAL_TABLET | Freq: Two times a day (BID) | ORAL | Status: DC
  Administered 2021-06-02: 50 mg via ORAL
  Filled 2021-06-02: qty 1

## 2021-06-02 MED ORDER — NITROGLYCERIN 0.4 MG SL SUBL
0.4000 mg | SUBLINGUAL_TABLET | SUBLINGUAL | 11 refills | Status: DC | PRN
  Filled 2021-06-02: qty 25, 7d supply, fill #0

## 2021-06-02 MED ORDER — AMIODARONE HCL 200 MG PO TABS
400.0000 mg | ORAL_TABLET | Freq: Two times a day (BID) | ORAL | Status: DC
  Administered 2021-06-02: 400 mg via ORAL
  Filled 2021-06-02: qty 2

## 2021-06-02 MED ORDER — AMIODARONE HCL 200 MG PO TABS
ORAL_TABLET | ORAL | 1 refills | Status: DC
  Filled 2021-06-02: qty 58, 30d supply, fill #0

## 2021-06-02 NOTE — Progress Notes (Addendum)
Progress Note  Patient Name: Shawn Bauer Date of Encounter: 06/02/2021  [facility] Cardiologist: Sherren Mocha, MD   Subjective   Feels very well this morning.  No CP, no SOB  Inpatient Medications    Scheduled Meds:  amiodarone  400 mg Oral BID   aspirin EC  81 mg Oral Daily   atorvastatin  40 mg Oral Daily   clopidogrel  75 mg Oral Q breakfast   influenza vaccine adjuvanted  0.5 mL Intramuscular Tomorrow-1000   metoprolol succinate  50 mg Oral BID   sodium chloride flush  3 mL Intravenous Q12H   sodium chloride flush  3 mL Intravenous Q12H   cyanocobalamin  1,000 mcg Oral Daily   Continuous Infusions:  sodium chloride     PRN Meds: sodium chloride, acetaminophen, acetaminophen, melatonin, morphine injection, nitroGLYCERIN, ondansetron (ZOFRAN) IV, ondansetron (ZOFRAN) IV, sodium chloride flush   Vital Signs    Vitals:   06/01/21 1628 06/01/21 2028 06/02/21 0028 06/02/21 0849  BP: (!) 144/69 130/62 129/63 (!) 122/59  Pulse: 73 84 74 77  Resp: '13 16 16 16  ' Temp:  98.2 F (36.8 C)  97.8 F (36.6 C)  TempSrc:  Oral  Oral  SpO2: 97% 94% 95% 96%  Weight:      Height:        Intake/Output Summary (Last 24 hours) at 06/02/2021 1000 Last data filed at 06/02/2021 0318 Gross per 24 hour  Intake 1492.94 ml  Output 400 ml  Net 1092.94 ml   Last 3 Weights 05/31/2021 05/16/2021 05/08/2021  Weight (lbs) 141 lb 8.6 oz 139 lb 140 lb  Weight (kg) 64.2 kg 63.05 kg 63.504 kg      Telemetry    SR, occ single PVCs - Personally Reviewed  ECG    No new EKGs - Personally Reviewed  Physical Exam   GEN: No acute distress.   Neck: No JVD Cardiac: RRR, no murmurs, rubs, or gallops.  Respiratory: CTA b/l. GI: Soft, nontender, non-distended  MS: No edema; No deformity. Neuro:  Nonfocal  Psych: Normal affect   Labs    High Sensitivity Troponin:   Recent Labs  Lab 05/08/21 0645 05/31/21 1015 05/31/21 1435 05/31/21 1725 05/31/21 1843  TROPONINIHS 39* 19* 1,520*  2,165* 2,237*      Chemistry Recent Labs  Lab 05/31/21 1015 06/01/21 0154 06/02/21 0229  NA 136 138 141  K 4.2 4.0 3.5  CL 106 110 111  CO2 20* 20* 20*  GLUCOSE 137* 147* 105*  BUN 30* 28* 18  CREATININE 1.95* 1.92* 1.57*  CALCIUM 8.3* 8.2* 8.2*  PROT 5.5*  --   --   ALBUMIN 3.1*  --   --   AST 31  --   --   ALT 37  --   --   ALKPHOS 40  --   --   BILITOT 0.9  --   --   GFRNONAA 33* 34* 43*  ANIONGAP '10 8 10     ' Hematology Recent Labs  Lab 05/31/21 1015 06/01/21 0154 06/02/21 0229  WBC 15.3* 14.5* 6.6  RBC 3.58* 3.59* 3.30*  HGB 11.5* 11.7* 10.4*  HCT 35.4* 34.4* 32.1*  MCV 98.9 95.8 97.3  MCH 32.1 32.6 31.5  MCHC 32.5 34.0 32.4  RDW 13.1 13.2 13.5  PLT 212 161 147*    BNPNo results for input(s): BNP, PROBNP in the last 168 hours.   DDimer No results for input(s): DDIMER in the last 168 hours.   Radiology  DG Chest Portable 1 View Result Date: 05/31/2021 CLINICAL DATA:  Chest pain EXAM: PORTABLE CHEST 1 VIEW COMPARISON:  01/21/2009 FINDINGS: Post CABG changes. Heart size is upper limits of normal. Atherosclerotic calcification of the aortic knob. No focal airspace consolidation, pleural effusion, or pneumothorax. IMPRESSION: No active disease. Electronically Signed   By: Davina Poke D.O.   On: 05/31/2021 11:44    Cardiac Studies   06/01/21: LHC   Mid LM to Dist LM lesion is 99% stenosed.   Prox LAD lesion is 100% stenosed.   Ost Cx to Prox Cx lesion is 100% stenosed.   Ost RCA to Prox RCA lesion is 60% stenosed.   Mid RCA lesion is 15% stenosed.   Origin lesion is 100% stenosed.   Dist RCA lesion is 40% stenosed.   Non-stenotic Mid RCA to Dist RCA lesion was previously treated.     05/08/21: LHC/PCI   Occlusion of saphenous vein graft to right coronary   Patent saphenous vein graft to diagonal   Patent free left radial to circumflex   Patent LIMA to LAD   Total occlusion of proximal circumflex   99% distal left main   Total occlusion of  proximal to mid LAD   Native RCA heavily calcified with ostial to distal disease.  Focal 90% stenosis within a diffusely diseased segment felt to be culprit for angina.  TIMI grade III flow noted.   Successful high risk RCA PCI complicated by balloon rupture and spiral dissection treated with overlapping 2.5 mm drug-eluting stents 12 mm x 2.5 distal overlap with a 22 x 2.5 mm from distal to mid vessel.  TIMI grade III flow.  Residual stenosis at the overlap site approximately 20%.   05/08/21: TTE IMPRESSIONS   1. Left ventricular ejection fraction, by estimation, is 55 to 60%. The  left ventricle has normal function. The left ventricle demonstrates global  hypokinesis. There is mild concentric left ventricular hypertrophy. Left  ventricular diastolic parameters  are consistent with Grade I diastolic dysfunction (impaired relaxation).   2. Right ventricular systolic function is normal. The right ventricular  size is normal. There is normal pulmonary artery systolic pressure.   3. The mitral valve is grossly normal. No evidence of mitral valve  regurgitation. No evidence of mitral stenosis.   4. The aortic valve is tricuspid. Aortic valve regurgitation is not  visualized. No aortic stenosis is present.   5. The inferior vena cava is normal in size with greater than 50%  respiratory variability, suggesting right atrial pressure of 3 mmHg.   Comparison(s): A prior study was performed on 04/04/2020. PVCs more  frequent during exam.     Patient Profile     87 y.o. male with a hx of CAD (CABG 6384, angina > complicated PCI to RCA 5/36/46), multiple myeloma ( IgG kappa myelom, current chemo is Velcade/Decadron every other week), HTN, HLD, AFlutter (ablated Aug 2021 by Dr. Lovena Le, off a/c) admitted with CP, recurrent NSVT  Assessment & Plan    NSVT, PVCs Agree, likely the cause of his noted slow HRs at home No syncope, no near syncope   Interestingly his HS Trop started where he was last  admission though had a sharp rise to >2000 Cath though with no new dz LVEF was 55-60% by his echo last admission described as having global hypokinesis  No NSVT in the last 24hours that I found, PVC burden much improved  Transitioned to PO amiodarone today Agree with planned PO load and titration  of his BB Out patient cardiology follow up  Dr. Lovena Le will see later today   For questions or updates, please contact [facility] Please consult [email or web address] for contact info under     Signed, Baldwin Jamaica, PA-C  06/02/2021, 10:00 AM    EP Attending  Patient seen and examined. Agree with the findings as noted above. The patient presents NSVT and PVC's and preserved EF. He has quieted down nicely with IV amio and has been switched to po amio. Ok for DC home amio load as above. Continue beta blocker.   Carleene Overlie Alexsys Eskin,MD

## 2021-06-02 NOTE — Progress Notes (Signed)
Progress Note  Patient Name: Shawn Bauer Shawn Bauer Date of Encounter: 06/02/2021  [facility] Cardiologist: Shawn Mocha, MD   Subjective   Feeling well this morning. No pain.   Inpatient Medications    Scheduled Meds:  amiodarone  400 mg Oral BID   aspirin EC  81 mg Oral Daily   atorvastatin  40 mg Oral Daily   clopidogrel  75 mg Oral Q breakfast   influenza vaccine adjuvanted  0.5 mL Intramuscular Tomorrow-1000   metoprolol succinate  50 mg Oral BID   sodium chloride flush  3 mL Intravenous Q12H   sodium chloride flush  3 mL Intravenous Q12H   cyanocobalamin  1,000 mcg Oral Daily   Continuous Infusions:  sodium chloride     PRN Meds: sodium chloride, acetaminophen, acetaminophen, melatonin, morphine injection, nitroGLYCERIN, ondansetron (ZOFRAN) IV, ondansetron (ZOFRAN) IV, sodium chloride flush   Vital Signs    Vitals:   06/01/21 1628 06/01/21 2028 06/02/21 0028 06/02/21 0849  BP: (!) 144/69 130/62 129/63 (!) 122/59  Pulse: 73 84 74 77  Resp: '13 16 16 16  ' Temp:  98.2 F (36.8 C)  97.8 F (36.6 C)  TempSrc:  Oral  Oral  SpO2: 97% 94% 95% 96%  Weight:      Height:        Intake/Output Summary (Last 24 hours) at 06/02/2021 0947 Last data filed at 06/02/2021 0318 Gross per 24 hour  Intake 1492.94 ml  Output 400 ml  Net 1092.94 ml   Last 3 Weights 05/31/2021 05/16/2021 05/08/2021  Weight (lbs) 141 lb 8.6 oz 139 lb 140 lb  Weight (kg) 64.2 kg 63.05 kg 63.504 kg      Telemetry    SR with freq PVCs ( much improved) - Personally Reviewed  ECG    No new tracing this morning  Physical Exam   GEN: No acute distress.   Neck: No JVD Cardiac: RRR, no murmurs, rubs, or gallops.  Respiratory: Clear to auscultation bilaterally. GI: Soft, nontender, non-distended  MS: No edema; No deformity. Right femoral cath site stable.  Neuro:  Nonfocal  Psych: Normal affect   Labs    High Sensitivity Troponin:   Recent Labs  Lab 05/08/21 0645 05/31/21 1015  05/31/21 1435 05/31/21 1725 05/31/21 1843  TROPONINIHS 39* 19* 1,520* 2,165* 2,237*      Chemistry Recent Labs  Lab 05/31/21 1015 06/01/21 0154 06/02/21 0229  NA 136 138 141  K 4.2 4.0 3.5  CL 106 110 111  CO2 20* 20* 20*  GLUCOSE 137* 147* 105*  BUN 30* 28* 18  CREATININE 1.95* 1.92* 1.57*  CALCIUM 8.3* 8.2* 8.2*  PROT 5.5*  --   --   ALBUMIN 3.1*  --   --   AST 31  --   --   ALT 37  --   --   ALKPHOS 40  --   --   BILITOT 0.9  --   --   GFRNONAA 33* 34* 43*  ANIONGAP '10 8 10     ' Hematology Recent Labs  Lab 05/31/21 1015 06/01/21 0154 06/02/21 0229  WBC 15.3* 14.5* 6.6  RBC 3.58* 3.59* 3.30*  HGB 11.5* 11.7* 10.4*  HCT 35.4* 34.4* 32.1*  MCV 98.9 95.8 97.3  MCH 32.1 32.6 31.5  MCHC 32.5 34.0 32.4  RDW 13.1 13.2 13.5  PLT 212 161 147*    BNPNo results for input(s): BNP, PROBNP in the last 168 hours.   DDimer No results for input(s): DDIMER in  the last 168 hours.   Radiology    CARDIAC CATHETERIZATION  Result Date: 06/01/2021 Images from the original result were not included.   Mid LM to Dist LM lesion is 99% stenosed.   Prox LAD lesion is 100% stenosed.   Ost Cx to Prox Cx lesion is 100% stenosed.   Ost RCA to Prox RCA lesion is 60% stenosed.   Mid RCA lesion is 15% stenosed.   Origin lesion is 100% stenosed.   Dist RCA lesion is 40% stenosed.   Non-stenotic Mid RCA to Dist RCA lesion was previously treated. Shawn Bauer is a 87 y.o. male  [id number] LOCATION:  FACILITY: [facility] PHYSICIAN: Shawn Bauer, M.D. May 22, 1934 DATE OF PROCEDURE:  06/01/2021 DATE OF DISCHARGE: CARDIAC CATHETERIZATION History obtained from chart review.87 y.o. male with a history of CAD s/ p remote CABG in 2001 with recent NSTEMI on 05/07/2021 s/p high risk PCI of RCA complicated by balloon rupture and spiral dissection treated with overlapping DES stenting x2 from distal to mid vessel, non-sustained VT, PVCs, atrial flutter s/p ablation in 04/2020 hypertension, hyperlipidemia, CKD stage III,  multiple myeloma, and remote tobacco abuse (quit in 1965) who presented with chest pain and was found to have frequent runs of non-sustained VT. his enzymes were positive troponins in the 2000 range.  He presents now for diagnostic coronary angiography to rule out an ischemic etiology to his chest pain, non-STEMI and VT.   Shawn Bauer had a complex RCA intervention by Dr. Tamala Bauer approxi-1 month ago in the setting of a non-STEMI.  He had spiral dissection of his native right and underwent PCI and drug-eluting stenting of the proximal and mid RCA.  The RCA graft was occluded.  The remaining grafts were patent including the obtuse marginal branch, diagonal branch graft and LIMA to the LAD.  He was admitted with nonsustained VT and chest pain.  His troponins were positive.  His anatomy today is unchanged from 1 month ago with no obvious "culprit lesions".  Specifically, the RCA stents were widely patent.  The sheath will be removed and pressure held on the groin to achieve hemostasis.  The patient left lab in stable condition. Shawn Bauer. MD, [facility] 06/01/2021 2:17 PM    DG Chest Portable 1 View  Result Date: 05/31/2021 CLINICAL DATA:  Chest pain EXAM: PORTABLE CHEST 1 VIEW COMPARISON:  01/21/2009 FINDINGS: Post CABG changes. Heart size is upper limits of normal. Atherosclerotic calcification of the aortic knob. No focal airspace consolidation, pleural effusion, or pneumothorax. IMPRESSION: No active disease. Electronically Signed   By: Shawn Bauer D.O.   On: 05/31/2021 11:44    Cardiac Studies   Cath: 06/01/21    Mid LM to Dist LM lesion is 99% stenosed.   Prox LAD lesion is 100% stenosed.   Ost Cx to Prox Cx lesion is 100% stenosed.   Ost RCA to Prox RCA lesion is 60% stenosed.   Mid RCA lesion is 15% stenosed.   Origin lesion is 100% stenosed.   Dist RCA lesion is 40% stenosed.   Non-stenotic Mid RCA to Dist RCA lesion was previously treated.\\    IMPRESSION: Shawn Bauer had a complex RCA  intervention by Dr. Tamala Bauer approxi-1 month ago in the setting of a non-STEMI.  He had spiral dissection of his native right and underwent PCI and drug-eluting stenting of the proximal and mid RCA.  The RCA graft was occluded.  The remaining grafts were patent including the obtuse marginal branch, diagonal branch graft and  LIMA to the LAD.  He was admitted with nonsustained VT and chest pain.  His troponins were positive.  His anatomy today is unchanged from 1 month ago with no obvious "culprit lesions".  Specifically, the RCA stents were widely patent.  The sheath will be removed and pressure held on the groin to achieve hemostasis.  The patient left lab in stable condition.   Shawn Bauer. MD, [facility]  Diagnostic Dominance: Right   Patient Profile     87 y.o. male with a history of CAD s/ p remote CABG in 2001 with recent NSTEMI on 05/07/2021 s/p high risk PCI of RCA complicated by balloon rupture and spiral dissection treated with overlapping DES stenting x2 from distal to mid vessel, non-sustained VT, PVCs, atrial flutter s/p ablation in 04/2020 hypertension, hyperlipidemia, CKD stage III, multiple myeloma, and remote tobacco abuse (quit in 1965) who presented with chest pain and was found to have frequent runs of non-sustained VT.  Assessment & Plan    Chest Pain/History of CAD with Recent NSTEMI: Patient presented with chest pain that resolved with nitro.  He was recently admitted for non-STEMI about 3 weeks ago and underwent complex PCI to the RCA complicated by balloon rupture and spiral dissection.  This was treated with 2 overlapping DES stents. -- hsTn continued to rise this admission, up to 2237,underwent cardiac cath 9/8 which was unchanged with patent stenting in the RCA. Unclear etiology for elevated troponin -- Continue aspirin, Plavix, beta-blocker and high intensity statin.   Non-Sustained VT: Had nonsustained VT during recent admission for non-STEMI and was initially placed on IV  Amiodarone with improvement.  Amiodarone was ultimately stopped and patient was discharged on beta-blocker during recent prior admission.   -- Patient presented with multiple short runs of nonsustained VT. Started on IV amiodarone. Continued to have freq ectopy with runs of short NSVT, PVCs, bigeminy  -- given stable cath, he was seen by EP yesterday (Dr. Lovena Le) with recommendations to continue on BB and amiodarone therapy -- will increase metoprolol to 69m BID, transitioned to oral amiodarone 4046mBID x1, then 20025mID x1 week, and then 200m99mily   History of Atrial Flutter s/p Ablation in 04/2020:  -- Continue beta-blocker as above. -- No longer on anticoagulation.    Hypertension: stable -- Continue Toprol-XL as above.    Hyperlipidemia: LDL 69 on 05/08/2021. -- Continue Lipitor 40mg29mly.   Acute on CKD Stage III: Creatinine 1.95 on admission. Baseline creatinine around 1.5 to 1.6.  -- ARB held on admission -- Cr 1.95>>1.92>>1.5 post cath -- plan to resume ARB on discharge    Leukocytosis: WBC 15.3>>14.5, Afebrile and no obvious signs of infection. CXR negative. -- suspect reactive leukocytosis. Will continue to monitor.   Multiple Myeloma: Followed by Oncology as outpatient and being treated with Velcade.  For questions or updates, please contact [facility] consult [email or web address] for contact info under        Signed, LindsReino Bellis 06/02/2021, 9:47 AM

## 2021-06-02 NOTE — Discharge Summary (Signed)
Discharge Summary    Patient ID: Shawn Bauer MRN: [id number]; DOB: 07-30-1934  Admit date: 05/31/2021 Discharge date: 06/02/2021  PCP:  [name], MD   [facility] Providers Cardiologist:  Sherren Mocha, MD   Discharge Diagnoses    Principal Problem:   Non-STEMI (non-ST elevated myocardial infarction) [facility]) Active Problems:   Hyperlipidemia   HYPERTENSION, BENIGN   Ventricular tachycardia ([place])   NSVT (nonsustained ventricular tachycardia) ([place])   Diagnostic Studies/Procedures    Cath: 06/01/21  Mid LM to Dist LM lesion is 99% stenosed.   Prox LAD lesion is 100% stenosed.   Ost Cx to Prox Cx lesion is 100% stenosed.   Ost RCA to Prox RCA lesion is 60% stenosed.   Mid RCA lesion is 15% stenosed.   Origin lesion is 100% stenosed.   Dist RCA lesion is 40% stenosed.   Non-stenotic Mid RCA to Dist RCA lesion was previously treated.  IMPRESSION: Shawn Bauer had a complex RCA intervention by Dr. Tamala Julian approxi-1 month ago in the setting of a non-STEMI.  He had spiral dissection of his native right and underwent PCI and drug-eluting stenting of the proximal and mid RCA.  The RCA graft was occluded.  The remaining grafts were patent including the obtuse marginal branch, diagonal branch graft and LIMA to the LAD.  He was admitted with nonsustained VT and chest pain.  His troponins were positive.  His anatomy today is unchanged from 1 month ago with no obvious "culprit lesions".  Specifically, the RCA stents were widely patent.  The sheath will be removed and pressure held on the groin to achieve hemostasis.  The patient left lab in stable condition.   Quay Burow. MD, [facility] 06/01/2021  Diagnostic Dominance: Right  _____________   History of Present Illness     Shawn Bauer is a 87 y.o. male with with a history of CAD s/ p remote CABG in 2001 with recent NSTEMI on 05/07/2021 s/p high risk PCI of RCA complicated by balloon rupture and spiral dissection treated with overlapping  DES stenting x2 from distal to mid vessel, non-sustained VT, PVCs, atrial flutter s/p ablation in 04/2020 hypertension, hyperlipidemia, CKD stage III, multiple myeloma, and remote tobacco abuse (quit in 1965) who presented with chest pain and was found to have frequent runs of non-sustained VT.  Shawn Bauer 87 year old male with the above history who is followed by Dr. Burt Knack and Dr. Lovena Le.  He was recently admitted from 05/07/2021 to 05/09/2021 with chest pain and mildly elevated troponin.  He was noted to have frequent runs of nonsustained VT.  Cardiac catheterization showed occluded SVG to RCA and heavily calcified native RCA.  He underwent high risk PCI of the RCA which was complicated by balloon rupture and spiral distal section.  This was treated with 2 overlapping DES stents with residual stenosis at the overlap site of about 20%.  Her chest pain after this.  Plan was for dual antiplatelet therapy with aspirin and Plavix for 12 months.  Echo during admission showed LVEF of 55 to 60% and grade 1 diastolic dysfunction.  He was initially started on IV amiodarone with improvement in his nonsustained VT but he continued to have frequent PVCs.  Amiodarone was eventually discontinued and he was started on Metoprolol.  He was discharged on Toprol-XL 25 mg twice daily and outpatient ZIO monitor was ordered at discharge.   Patient returns to the ED via [facility] for evaluation of chest pain. Patient states he was doing well  since discharge with no recurrent pain until this morning when he developed substernal nonradiating chest pain that he describes as a dullness while washing dishes.  He states this pain felt similar to the pain he had prior to his recent non-STEMI but less severe.  He took 2 doses of sublingual nitro (although he states this was expired) as well as a full dose of aspirin 365 mg with some mild improvement but no full relief.  Therefore 911 was called.  He denied any associated symptoms with  this -no shortness of breath, diaphoresis, nausea, vomiting.  He does have some chronic shortness of breath if he overexerts himself due to his asthma but this is stable.  He stated he was moving branches from a fall injury yesterday in his yard and got significantly short of breath and fatigued with this but has otherwise been in his usual state of health.  No orthopnea, PND, lower extremity edema.  At his oncology office the day prior to admission and his heart rates were in the 30s; however, suspect this was due to bigeminy PVCs.  No lightheadedness, dizziness, syncope.  He was compliant with all of his medications including DAPT and denies any abnormal bleeding. Per EMS report he had multiple runs of asymptomatic VT in route to the ED.   Upon arrival to the ED, patient mildly hypertensive.  Initial EKG showed multiple runs of nonsustained VT. WBC 15.3, Hgb 11.5, Plts 212. Na 136, K 4.2, Glucose 137, BUN 30, Cr 1.95. Albumin 3.1; otherwise, LFTs normal. Has been started on IV Amiodarone and Cardiology was asked to evaluate.   Hospital Course     Consultants: EP  Chest Pain/History of CAD with Recent NSTEMI: Patient presented with chest pain that resolved with nitro.  He was recently admitted for non-STEMI about 3 weeks ago and underwent complex PCI to the RCA complicated by balloon rupture and spiral dissection.  This was treated with 2 overlapping DES stents. -- hsTn continued to rise this admission, up to 2237,underwent cardiac cath 9/8 which was unchanged with patent stenting in the RCA. Unclear etiology for elevated troponin -- Continue aspirin, Plavix, beta-blocker and high intensity statin.   Non-Sustained VT: Had nonsustained VT during recent admission for non-STEMI and was initially placed on IV Amiodarone with improvement.  Amiodarone was ultimately stopped and patient was discharged on beta-blocker during recent prior admission.   -- Patient presented with multiple short runs of  nonsustained VT. Started on IV amiodarone. Continued to have freq ectopy with runs of short NSVT, PVCs, bigeminy  -- given stable cath, he was seen by EP (Dr. Taylor) with recommendations to continue on BB and amiodarone therapy -- increased metoprolol to 50mg BID, transitioned to oral amiodarone 400mg BID x1, then 200mg BID x1 week, and then 200mg daily   History of Atrial Flutter s/p Ablation in 04/2020:  -- Continue beta-blocker as above. -- No longer on anticoagulation.    Hypertension: stable -- Continue Toprol-XL as above.    Hyperlipidemia: LDL 69 on 05/08/2021. -- Continue Lipitor 40mg daily.   Acute on CKD Stage III: Creatinine 1.95 on admission. Baseline creatinine around 1.5 to 1.6.  -- ARB held on admission -- Cr 1.95>>1.92>>1.5 post cath -- plan to resume ARB on discharge    Leukocytosis: WBC 15.3>>14.5, Afebrile and no obvious signs of infection. CXR negative. -- suspect reactive leukocytosis.   Multiple Myeloma: Followed by Oncology as outpatient and being treated with Velcade  Did the patient have an acute coronary   syndrome (MI, NSTEMI, STEMI, etc) this admission?:  Yes                               AHA/ACC Clinical Performance & Quality Measures: Aspirin prescribed? - Yes ADP Receptor Inhibitor (Plavix/Clopidogrel, Brilinta/Ticagrelor or Effient/Prasugrel) prescribed (includes medically managed patients)? - Yes Beta Blocker prescribed? - Yes High Intensity Statin (Lipitor 40-45m or Crestor 20-429m prescribed? - Yes EF assessed during THIS hospitalization? - Yes For EF <40%, was ACEI/ARB prescribed? - Not Applicable (EF >/= 4025%For EF <40%, Aldosterone Antagonist (Spironolactone or Eplerenone) prescribed? - Not Applicable (EF >/= 4005%Cardiac Rehab Phase II ordered (including medically managed patients)? - Yes (during admission on 8/14-8/16)     _____________  Discharge Vitals Blood pressure (!) 147/64, pulse 72, temperature 97.8 F (36.6 C), temperature  source Oral, resp. rate 17, height 5' 11" (1.803 m), weight 64.2 kg, SpO2 95 %.  Filed Weights   05/31/21 1504  Weight: 64.2 kg    Labs & Radiologic Studies    CBC Recent Labs    05/31/21 1015 06/01/21 0154 06/02/21 0229  WBC 15.3* 14.5* 6.6  NEUTROABS 12.4*  --   --   HGB 11.5* 11.7* 10.4*  HCT 35.4* 34.4* 32.1*  MCV 98.9 95.8 97.3  PLT 212 161 14397  Basic Metabolic Panel Recent Labs    05/31/21 1019 06/01/21 0154 06/02/21 0229  NA  --  138 141  K  --  4.0 3.5  CL  --  110 111  CO2  --  20* 20*  GLUCOSE  --  147* 105*  BUN  --  28* 18  CREATININE  --  1.92* 1.57*  CALCIUM  --  8.2* 8.2*  MG 2.2  --   --    Liver Function Tests Recent Labs    05/31/21 1015  AST 31  ALT 37  ALKPHOS 40  BILITOT 0.9  PROT 5.5*  ALBUMIN 3.1*   No results for input(s): LIPASE, AMYLASE in the last 72 hours. High Sensitivity Troponin:   Recent Labs  Lab 05/08/21 0645 05/31/21 1015 05/31/21 1435 05/31/21 1725 05/31/21 1843  TROPONINIHS 39* 19* 1,520* 2,165* 2,237*    BNP Invalid input(s): POCBNP D-Dimer No results for input(s): DDIMER in the last 72 hours. Hemoglobin A1C No results for input(s): HGBA1C in the last 72 hours. Fasting Lipid Panel No results for input(s): CHOL, HDL, LDLCALC, TRIG, CHOLHDL, LDLDIRECT in the last 72 hours. Thyroid Function Tests No results for input(s): TSH, T4TOTAL, T3FREE, THYROIDAB in the last 72 hours.  Invalid input(s): FREET3 _____________  CARDIAC CATHETERIZATION  Result Date: 06/01/2021 Images from the original result were not included.   Mid LM to Dist LM lesion is 99% stenosed.   Prox LAD lesion is 100% stenosed.   Ost Cx to Prox Cx lesion is 100% stenosed.   Ost RCA to Prox RCA lesion is 60% stenosed.   Mid RCA lesion is 15% stenosed.   Origin lesion is 100% stenosed.   Dist RCA lesion is 40% stenosed.   Non-stenotic Mid RCA to Dist RCA lesion was previously treated. JeGENTLE HOGEs a [age].o. male  [id number]:   [facility]: JoQuay BurowM.D. 1004/28/37ATE OF PROCEDURE:  06/01/2021 DATE OF DISCHARGE: CARDIAC CATHETERIZATION History obtained from chart review.[age].o. male with a history of CAD s/ p remote CABG in 2001 with recent NSTEMI on 05/07/2021 s/p high risk PCI of  RCA complicated by balloon rupture and spiral dissection treated with overlapping DES stenting x2 from distal to mid vessel, non-sustained VT, PVCs, atrial flutter s/p ablation in 04/2020 hypertension, hyperlipidemia, CKD stage III, multiple myeloma, and remote tobacco abuse (quit in 1965) who presented with chest pain and was found to have frequent runs of non-sustained VT. his enzymes were positive troponins in the 2000 range.  He presents now for diagnostic coronary angiography to rule out an ischemic etiology to his chest pain, non-STEMI and VT.   Mr. Quach had a complex RCA intervention by Dr. Smith approxi-1 month ago in the setting of a non-STEMI.  He had spiral dissection of his native right and underwent PCI and drug-eluting stenting of the proximal and mid RCA.  The RCA graft was occluded.  The remaining grafts were patent including the obtuse marginal branch, diagonal branch graft and LIMA to the LAD.  He was admitted with nonsustained VT and chest pain.  His troponins were positive.  His anatomy today is unchanged from 1 month ago with no obvious "culprit lesions".  Specifically, the RCA stents were widely patent.  The sheath will be removed and pressure held on the groin to achieve hemostasis.  The patient left lab in stable condition. Jonathan Berry. MD, FACC 06/01/2021 2:17 PM    CARDIAC CATHETERIZATION  Result Date: 05/08/2021   Occlusion of saphenous vein graft to right coronary   Patent saphenous vein graft to diagonal   Patent free left radial to circumflex   Patent LIMA to LAD   Total occlusion of proximal circumflex   99% distal left main   Total occlusion of proximal to mid LAD   Native RCA heavily calcified with  ostial to distal disease.  Focal 90% stenosis within a diffusely diseased segment felt to be culprit for angina.  TIMI grade III flow noted.   Successful high risk RCA PCI complicated by balloon rupture and spiral dissection treated with overlapping 2.5 mm drug-eluting stents 12 mm x 2.5 distal overlap with a 22 x 2.5 mm from distal to mid vessel.  TIMI grade III flow.  Residual stenosis at the overlap site approximately 20%. Aspirin and Plavix for at least 12 months then monotherapy with Plavix thereafter. Bivalirudin for 2 hours before discontinuation.  Half dose infusion. Preventive therapy per treating team. At risk for development of femoral bleed.  Needs to be followed closely since sheath will not be pulled in the Cath Lab.   DG Chest Portable 1 View  Result Date: 05/31/2021 CLINICAL DATA:  Chest pain EXAM: PORTABLE CHEST 1 VIEW COMPARISON:  01/21/2009 FINDINGS: Post CABG changes. Heart size is upper limits of normal. Atherosclerotic calcification of the aortic knob. No focal airspace consolidation, pleural effusion, or pneumothorax. IMPRESSION: No active disease. Electronically Signed   By: Nicholas  Plundo D.O.   On: 05/31/2021 11:44   ECHOCARDIOGRAM COMPLETE  Result Date: 05/08/2021    ECHOCARDIOGRAM REPORT   Patient Name:   Daijon W Schoon Date of Exam: 05/08/2021 Medical Rec #:  [id number]      Height:       71.0 in Accession #:    [id number]     Weight:       140.9 lb Date of Birth:  10/01/1933     BSA:          1.817 m Patient Age:    86 years       BP:           122/66 mmHg Patient   Gender: M              HR:           76 bpm. Exam Location:  Inpatient Procedure: 2D Echo, Cardiac Doppler and Color Doppler Indications:    Ventricular tachycardia  History:        Patient has prior history of Echocardiogram examinations, most                 recent 04/04/2020. CAD, Prior CABG, Arrythmias:Atrial                 Fibrillation and ablation, Signs/Symptoms:Chest Pain and CKD;                 Risk  Factors:Hypertension.  Sonographer:    Brooke Strickland RDCS Referring Phys: [id number] SCOTT W ROSE IMPRESSIONS  1. Left ventricular ejection fraction, by estimation, is 55 to 60%. The left ventricle has normal function. The left ventricle demonstrates global hypokinesis. There is mild concentric left ventricular hypertrophy. Left ventricular diastolic parameters are consistent with Grade I diastolic dysfunction (impaired relaxation).  2. Right ventricular systolic function is normal. The right ventricular size is normal. There is normal pulmonary artery systolic pressure.  3. The mitral valve is grossly normal. No evidence of mitral valve regurgitation. No evidence of mitral stenosis.  4. The aortic valve is tricuspid. Aortic valve regurgitation is not visualized. No aortic stenosis is present.  5. The inferior vena cava is normal in size with greater than 50% respiratory variability, suggesting right atrial pressure of 3 mmHg. Comparison(s): A prior study was performed on 04/04/2020. PVCs more frequent during exam. FINDINGS  Left Ventricle: Left ventricular ejection fraction, by estimation, is 55 to 60%. The left ventricle has normal function. The left ventricle demonstrates global hypokinesis. The left ventricular internal cavity size was normal in size. There is mild concentric left ventricular hypertrophy. Left ventricular diastolic parameters are consistent with Grade I diastolic dysfunction (impaired relaxation). Right Ventricle: The right ventricular size is normal. No increase in right ventricular wall thickness. Right ventricular systolic function is normal. There is normal pulmonary artery systolic pressure. The tricuspid regurgitant velocity is 2.12 m/s, and  with an assumed right atrial pressure of 3 mmHg, the estimated right ventricular systolic pressure is 21.0 mmHg. Left Atrium: Left atrial size was normal in size. Right Atrium: Right atrial size was normal in size. Pericardium: There is no evidence of  pericardial effusion. Mitral Valve: The mitral valve is grossly normal. No evidence of mitral valve regurgitation. No evidence of mitral valve stenosis. Tricuspid Valve: The tricuspid valve is grossly normal. Tricuspid valve regurgitation is trivial. Aortic Valve: The aortic valve is tricuspid. Aortic valve regurgitation is not visualized. No aortic stenosis is present. Pulmonic Valve: The pulmonic valve was not well visualized. Pulmonic valve regurgitation is mild. No evidence of pulmonic stenosis. Aorta: The aortic root is normal in size and structure and the ascending aorta was not well visualized. Venous: The inferior vena cava is normal in size with greater than 50% respiratory variability, suggesting right atrial pressure of 3 mmHg. IAS/Shunts: The atrial septum is grossly normal.  LEFT VENTRICLE PLAX 2D LVIDd:         4.10 cm  Diastology LVIDs:         3.30 cm  LV e' medial:    6.20 cm/s LV PW:         1.20 cm  LV E/e' medial:  12.9 LV IVS:          1.20 cm  LV e' lateral:   8.81 cm/s LVOT diam:     2.10 cm  LV E/e' lateral: 9.1 LV SV:         51 LV SV Index:   28 LVOT Area:     3.46 cm  RIGHT VENTRICLE RV Basal diam:  3.20 cm RV S prime:     3.92 cm/s TAPSE (M-mode): 1.5 cm LEFT ATRIUM             Index       RIGHT ATRIUM           Index LA diam:        3.60 cm 1.98 cm/m  RA Area:     19.40 cm LA Vol (A2C):   30.1 ml 16.57 ml/m RA Volume:   54.00 ml  29.72 ml/m LA Vol (A4C):   41.0 ml 22.57 ml/m LA Biplane Vol: 38.3 ml 21.08 ml/m  AORTIC VALVE LVOT Vmax:   64.60 cm/s LVOT Vmean:  41.400 cm/s LVOT VTI:    0.148 m  AORTA Ao Root diam: 3.30 cm MITRAL VALVE               TRICUSPID VALVE MV Area (PHT): 3.93 cm    TR Peak grad:   18.0 mmHg MV Decel Time: 193 msec    TR Vmax:        212.00 cm/s MV E velocity: 80.10 cm/s MV A velocity: 66.40 cm/s  SHUNTS MV E/A ratio:  1.21        Systemic VTI:  0.15 m                            Systemic Diam: 2.10 cm Rudean Haskell MD Electronically signed by Rudean Haskell MD Signature Date/Time: 05/08/2021/1:24:14 PM    Final    Disposition   Pt is being discharged home today in good condition.  Follow-up Plans & Appointments     Follow-up Information     Liliane Shi, PA-C Follow up on 06/19/2021.   Specialties: Cardiology, Physician Assistant Why: at 11:45am for your follow up appt Contact information: [address] [place] [address] [phone]                  Discharge Medications   Allergies as of 06/02/2021   No Known Allergies      Medication List     TAKE these medications    amiodarone 200 MG tablet Commonly known as: PACERONE Take 2 tablets (400 mg total) by mouth 2 (two) times daily for 7 days, THEN 2 tablets (400 mg total) once daily for 7 days, THEN 1 tablet (200 mg total) once daily. Start taking on: June 02, 2021   aspirin EC 81 MG tablet Take 81 mg by mouth daily. Swallow whole.   atorvastatin 40 MG tablet Commonly known as: LIPITOR Take 1 tablet (40 mg total) by mouth daily.   CINNAMON PO Take 1,000 mg by mouth daily.   clopidogrel 75 MG tablet Commonly known as: PLAVIX Take 1 tablet (75 mg total) by mouth daily with breakfast.   cyanocobalamin 1000 MCG tablet Take 1,000 mcg by mouth daily.   metoprolol succinate 50 MG 24 hr tablet Commonly known as: TOPROL-XL Take 1 tablet (50 mg total) by mouth 2 (two) times daily. Take with or immediately following a meal. What changed:  how much to take when to take this additional instructions  nitroGLYCERIN 0.4 MG SL tablet Commonly known as: NITROSTAT Place 1 tablet (0.4 mg total) under the tongue every 5 (five) minutes as needed for chest pain.   olmesartan 20 MG tablet Commonly known as: BENICAR Take 20 mg by mouth in the morning.   Symbicort 160-4.5 MCG/ACT inhaler Generic drug: budesonide-formoterol Inhale 2 puffs into the lungs 2 (two) times daily.   traMADol 50 MG tablet Commonly known as:  ULTRAM Take 50 mg by mouth 2 (two) times daily as needed (for pain).        Outstanding Labs/Studies   BMET at follow up appt  Duration of Discharge Encounter   Greater than 30 minutes including physician time.  Signed, Reino Bellis, NP 06/02/2021, 3:10 PM

## 2021-06-05 LAB — PLATELET INHIBITION P2Y12

## 2021-06-08 ENCOUNTER — Telehealth: Payer: Self-pay | Admitting: Cardiovascular Disease

## 2021-06-08 NOTE — Telephone Encounter (Signed)
thx

## 2021-06-08 NOTE — Telephone Encounter (Signed)
[employer] calling back with additional finding on patient cardiac monitor

## 2021-06-08 NOTE — Telephone Encounter (Signed)
Shawn Bauer with Shawn Bauer is calling in to report additional findings noted on the pts monitor from back on 8/25. Per Andee Poles, on 8/25, the monitor alerted and one of their reps read the pts recording as SVT at that time.  Per Andee Poles, they have received the final report, and on 8/25 the recording was misinterpreted and was not SVT, but pt had a 28 sec run of V-Tach at 173 bpm. Danielle wanted to let Dr. Burt Knack know about misinterpreted tracing, and make sure this did not delay his care he was providing to the pt.  Per Andee Poles, apologies provided to our office and Dr. Burt Knack for incorrectly interpreted monitor recording on 8/25. Andee Poles states she will call the office back tomorrow to touch base with Dr. Burt Knack to see if he has any additional concerns regarding this matter.  Andee Poles states the rep who misread the tracing that day has been counseled and is being offered personal remediation on this issue.   Informed Danielle that I will route this communication to Dr. Burt Knack for further review. Danielle verbalized understanding and agrees with this plan.  Again she provided her apologies from the company to Dr. Burt Knack.    Off note:  Pt admitted on 8/14-8/16 for cp and mildly elevated troponin.  Cardiac cath was done during that stay.  He as discharged on Toprol XL 25 mg bid and outpt zio was ordered at discharge.  Pt then returned to the ED via EMS on 9/7 for chest pain again. EKG upon arrival showed multiple runs of non-sustained V-Tach.  He was admitted at that time for further treatment. Pt was discharged on 9/9.  Pt will see Richardson Dopp PA-C for post hospital follow-up on 9/26 at 1145.

## 2021-06-09 ENCOUNTER — Telehealth: Payer: Self-pay | Admitting: Cardiovascular Disease

## 2021-06-09 NOTE — Telephone Encounter (Signed)
Mandy from [facility] called for follow up on phone note (note 9/15). No further recommendations.  Verbalized understanding.

## 2021-06-09 NOTE — Telephone Encounter (Signed)
Mandy from [facility]  reporting critical results for this pt.. trans to triage

## 2021-06-15 ENCOUNTER — Inpatient Hospital Stay: Payer: Medicare Other

## 2021-06-15 ENCOUNTER — Inpatient Hospital Stay: Payer: Medicare Other | Admitting: Hematology & Oncology

## 2021-06-15 ENCOUNTER — Telehealth: Payer: Self-pay

## 2021-06-15 NOTE — Telephone Encounter (Signed)
Called patient after not showing for appointments this am 06/15/21. Pt stated his wife called and left a message stating that they would not be able to come for their appointment. This RN called the patient. Patient stated that "they have some sort of bug and can't shake it" Pt states he is unsure if it is the flu or covid but denied taking any tests for it. Pt requesting to re-schedule when he isn't sick. Message sent to scheduling. Pt aware to call clinic with any concerns or questions. Pt verbalized understanding and had no further questions.

## 2021-06-18 NOTE — Progress Notes (Deleted)
Cardiology Office Note:    Date:  06/19/2021   ID:  Shawn Bauer, DOB 1933/11/06, MRN [id number]  PCP:  [name], MD   [place] [facility] Cardiologist:  Sherren Mocha, MD Electrophysiologist:  Cristopher Peru, MD { Click to update primary MD,subspecialty MD or APP then REFRESH:1}  *** Referring MD: [name], MD   Chief Complaint:  No chief complaint on file. {Click here for Visit Info    :1}   Patient Profile:   Shawn Bauer is a 87 y.o. male with:  Coronary artery disease  S/p CABG in 2001 Myoview 12/2012: low risk (small inf ischemia) - med Rx NSTEMI 8/22 >> Cath: S-RCA 100; patent S-OM, S-Dx, L-LAD >> s/p PCI c/b dissection >> overlapping DES x 2 to RCA Cath 9/22: patent RCA stents, no ? in anatomy since 8/22 Atrial flutter s/p RFA in 05/2020 Ventricular tachycardia  Admx 8/22 with NSVT in setting of NSTEMI Admx 9/22 with NSVT Amiodarone Rx  Hypertension  Hyperlipidemia  Chronic kidney disease stage 3 Multiple myeloma    Prior CV studies: CARDIAC TELEMETRY MONITORING-INTERPRETATION ONLY 06/09/2021 Narrative SUMMARY: The basic rhythm is normal sinus with an average heart rate of 71 bpm There is no atrial fibrillation or flutter There are frequent PVC's and nonsustained ventricular runs lasting up to 28 seconds associated with symptoms There are rare supraventricular beats and supraventricular runs Pt has been hospitalized and has undergone extensive evaluation and treatment since this monitor was completed.  LEFT HEART CATH AND CORONARY ANGIOGRAPHY 06/01/2021 Narrative   Mid LM to Dist LM lesion is 99% stenosed.   Prox LAD lesion is 100% stenosed.   Ost Cx to Prox Cx lesion is 100% stenosed.   Ost RCA to Prox RCA lesion is 60% stenosed.   Mid RCA lesion is 15% stenosed.   Origin lesion is 100% stenosed.   Dist RCA lesion is 40% stenosed.   Non-stenotic Mid RCA to Dist RCA lesion was previously treated.   LEFT HEART CATH  05/08/2021 Narrative    Occlusion of saphenous vein graft to right coronary   Patent saphenous vein graft to diagonal   Patent free left radial to circumflex   Patent LIMA to LAD   Total occlusion of proximal circumflex   99% distal left main   Total occlusion of proximal to mid LAD   Native RCA heavily calcified with ostial to distal disease.  Focal 90% stenosis within a diffusely diseased segment felt to be culprit for angina.  TIMI grade III flow noted.   Successful high risk RCA PCI complicated by balloon rupture and spiral dissection treated with overlapping 2.5 mm drug-eluting stents 12 mm x 2.5 distal overlap with a 22 x 2.5 mm from distal to mid vessel.  TIMI grade III flow.  Residual stenosis at the overlap site approximately 20%.     ECHOCARDIOGRAM 05/08/21  1. Left ventricular ejection fraction, by estimation, is 55 to 60%. The  left ventricle has normal function. The left ventricle demonstrates global  hypokinesis. There is mild concentric left ventricular hypertrophy. Left  ventricular diastolic parameters  are consistent with Grade I diastolic dysfunction (impaired relaxation).   2. Right ventricular systolic function is normal. The right ventricular  size is normal. There is normal pulmonary artery systolic pressure.   3. The mitral valve is grossly normal. No evidence of mitral valve  regurgitation. No evidence of mitral stenosis.   4. The aortic valve is tricuspid. Aortic valve regurgitation is not  visualized. No  aortic stenosis is present.   5. The inferior vena cava is normal in size with greater than 50%  respiratory variability, suggesting right atrial pressure of 3 mmHg.   Myoview 12/2012:  small area of inf ischemia, not gated; Low Risk    History of Present Illness: Shawn Bauer was admitted 8/14-8/16 with a NSTEMI c/b NSVT.  He was placed on IV Amiodarone with resolution of his NSVT.  Echocardiogram demonstrated normal EF.  He underwent cardiac catheterization which demonstrated patent  grafts to the Dx, LCx, LAD and an occluded graft to the RCA.  The native RCA was heavily calcified with ostial to distal disease and focal 90% stenosis.  The pt underwent PCI to the RCA c/b balloon rupture and spiral dissection tx with overlapping DES x 2.  Amiodarone was DC'd and he was [facility] on beta-blocker and an OP event monitor was arranged.  This demonstrated several runs of NSVT.  He was ultimately readmitted 9/7-9/9 with recurrent chest pain and frequent NSVT.  He was started back on IV Amiodarone.  His hs-Trop continued to increase and he had a repeat cardiac catheterization which demonstrated patent stents in the RCA.  He was seen by EP and he was tx with Amiodarone and beta-blocker.     He returns for post hospitalization f/u.  ***    Past Medical History:  Diagnosis Date   Asthma    Atrial fibrillation ([place])    CAD (coronary artery disease)    s/p CABG   Degenerative disk disease    cervical/spinal stenosis   Goals of care, counseling/discussion 09/30/2019   HTN (hypertension)    Hyperlipidemia    Kappa light chain myeloma ([place]) 09/30/2019   Current Medications: No outpatient medications have been marked as taking for the 06/19/21 encounter (Appointment) with Richardson Dopp T, PA-C.    Allergies:   Patient has no known allergies.   Social History   Tobacco Use   Smoking status: Former    Types: Cigarettes    Quit date: 08/31/1964    Years since quitting: 56.8   Smokeless tobacco: Former    Types: Chew    Quit date: 04/24/2016  Vaping Use   Vaping Use: Never used  Substance Use Topics   Alcohol use: No   Drug use: No    Family Hx: The patient's family history includes Heart attack in his brother; Heart attack (age of onset: 58) in his father.  ROS   EKGs/Labs/Other Test Reviewed:    EKG:  EKG is *** ordered today.  The ekg ordered today demonstrates ***  Recent Labs: 05/31/2021: ALT 37; Magnesium 2.2 06/02/2021: BUN 18; Creatinine, Ser 1.57; Hemoglobin 10.4; Platelets  147; Potassium 3.5; Sodium 141   Recent Lipid Panel Lab Results  Component Value Date/Time   CHOL 114 05/08/2021 06:45 AM   TRIG 48 05/08/2021 06:45 AM   HDL 35 (L) 05/08/2021 06:45 AM   LDLCALC 69 05/08/2021 06:45 AM     Risk Assessment/Calculations:   {Does this patient have ATRIAL FIBRILLATION?[phone]      Physical Exam:    VS:  There were no vitals taken for this visit.    Wt Readings from Last 3 Encounters:  05/31/21 141 lb 8.6 oz (64.2 kg)  05/16/21 139 lb (63 kg)  05/08/21 140 lb (63.5 kg)    Physical Exam ***     ASSESSMENT & PLAN:   {Select for Dx:25819} 1. Coronary artery disease involving native coronary artery of native heart with angina pectoris ([place])  History of CABG in 2001.  He did have a low risk Myoview in 2014 with small inferior ischemia.  He did recently have an episode of chest discomfort described as angina with moderate exertion.  However, he has been able to exert himself since that time without recurrent symptoms.  Overall, he seems to be stable.  His ECG does not demonstrate any significant changes.  Therefore, recommend continued medical therapy.  I recommend that we give him a prescription for as needed nitroglycerin.  He should return sooner if he has recurrent symptoms.  Otherwise, we will see him back in 6 months.             -Continue aspirin 81 mg, lovastatin 40 mg, metoprolol succinate              -Prescription for as needed nitroglycerin was given today             -Follow-up 6 months or sooner if recurrent symptoms   2. Essential hypertension The patient's blood pressure is controlled on his current regimen.  Continue current therapy with olmesartan 20 mg daily and metoprolol succinate 25 mg twice daily.   3. Mixed hyperlipidemia LDL optimal on most recent lab work.  Continue current Rx with lovastatin 40 mg daily.   4. Stage 3b chronic kidney disease Recent creatinine stable.  He remains on ARB therapy.   5.  Multiple  myeloma Currently undergoing chemotherapy under the direction of Dr. Marin Olp (Velcade, Delton See).  he is not on any anthracyclines.  Bortezomib (Velcade) has some potential for cardiotoxicity.  But, he has no evidence of volume excess or symptoms to suggest CHF.      { Click here to see a list of contraindications to cardiac rehab    :1}  Cardiac Rehabilitation Eligibility Assessment: {The patient has an active order for cardiac rehab.  Please indicate if the patient is ready to start cardiac rehab.:[id number]::"The patient is ready to start Cardiac Rehab from a cardiac standpoint."}   {Are you ordering a CV Procedure (e.g. stress test, cath, DCCV, TEE, etc)?   Press F2        :[id number]}   Dispo:  No follow-ups on file.   Medication Adjustments/Labs and Tests Ordered: Current medicines are reviewed at length with the patient today.  Concerns regarding medicines are outlined above.  Tests Ordered: No orders of the defined types were placed in this encounter.  Medication Changes: No orders of the defined types were placed in this encounter.  Signed, Richardson Dopp, PA-C  06/19/2021 8:46 AM    [facility] [address], [place], [place]  [address] Phone: [phone]; Fax: [phone]

## 2021-06-19 ENCOUNTER — Ambulatory Visit: Payer: Medicare Other | Admitting: Physician Assistant

## 2021-06-19 DIAGNOSIS — I483 Typical atrial flutter: Secondary | ICD-10-CM

## 2021-06-19 DIAGNOSIS — I10 Essential (primary) hypertension: Secondary | ICD-10-CM

## 2021-06-19 DIAGNOSIS — I472 Ventricular tachycardia: Secondary | ICD-10-CM

## 2021-06-19 DIAGNOSIS — E782 Mixed hyperlipidemia: Secondary | ICD-10-CM

## 2021-06-19 DIAGNOSIS — N1832 Chronic kidney disease, stage 3b: Secondary | ICD-10-CM

## 2021-06-19 DIAGNOSIS — I2510 Atherosclerotic heart disease of native coronary artery without angina pectoris: Secondary | ICD-10-CM

## 2021-06-23 ENCOUNTER — Other Ambulatory Visit (HOSPITAL_COMMUNITY): Payer: Self-pay

## 2021-07-06 ENCOUNTER — Telehealth: Payer: Self-pay | Admitting: *Deleted

## 2021-07-06 ENCOUNTER — Other Ambulatory Visit: Payer: Self-pay

## 2021-07-06 ENCOUNTER — Encounter: Payer: Self-pay | Admitting: Hematology & Oncology

## 2021-07-06 ENCOUNTER — Inpatient Hospital Stay: Payer: Medicare Other

## 2021-07-06 ENCOUNTER — Inpatient Hospital Stay (HOSPITAL_BASED_OUTPATIENT_CLINIC_OR_DEPARTMENT_OTHER): Payer: Medicare Other | Admitting: Hematology & Oncology

## 2021-07-06 ENCOUNTER — Inpatient Hospital Stay: Payer: Medicare Other | Attending: Family

## 2021-07-06 VITALS — BP 153/64 | HR 70 | Temp 97.8°F | Resp 20 | Wt 135.0 lb

## 2021-07-06 DIAGNOSIS — C9000 Multiple myeloma not having achieved remission: Secondary | ICD-10-CM | POA: Diagnosis present

## 2021-07-06 DIAGNOSIS — Z5111 Encounter for antineoplastic chemotherapy: Secondary | ICD-10-CM | POA: Diagnosis not present

## 2021-07-06 LAB — CBC WITH DIFFERENTIAL (CANCER CENTER ONLY)
Abs Immature Granulocytes: 0.07 10*3/uL (ref 0.00–0.07)
Basophils Absolute: 0.1 10*3/uL (ref 0.0–0.1)
Basophils Relative: 1 %
Eosinophils Absolute: 0.4 10*3/uL (ref 0.0–0.5)
Eosinophils Relative: 5 %
HCT: 36.9 % — ABNORMAL LOW (ref 39.0–52.0)
Hemoglobin: 11.9 g/dL — ABNORMAL LOW (ref 13.0–17.0)
Immature Granulocytes: 1 %
Lymphocytes Relative: 23 %
Lymphs Abs: 2 10*3/uL (ref 0.7–4.0)
MCH: 31.6 pg (ref 26.0–34.0)
MCHC: 32.2 g/dL (ref 30.0–36.0)
MCV: 98.1 fL (ref 80.0–100.0)
Monocytes Absolute: 1.1 10*3/uL — ABNORMAL HIGH (ref 0.1–1.0)
Monocytes Relative: 13 %
Neutro Abs: 5 10*3/uL (ref 1.7–7.7)
Neutrophils Relative %: 57 %
Platelet Count: 223 10*3/uL (ref 150–400)
RBC: 3.76 MIL/uL — ABNORMAL LOW (ref 4.22–5.81)
RDW: 14.5 % (ref 11.5–15.5)
WBC Count: 8.7 10*3/uL (ref 4.0–10.5)
nRBC: 0 % (ref 0.0–0.2)

## 2021-07-06 LAB — CMP (CANCER CENTER ONLY)
ALT: 24 U/L (ref 0–44)
AST: 17 U/L (ref 15–41)
Albumin: 3.7 g/dL (ref 3.5–5.0)
Alkaline Phosphatase: 60 U/L (ref 38–126)
Anion gap: 6 (ref 5–15)
BUN: 19 mg/dL (ref 8–23)
CO2: 31 mmol/L (ref 22–32)
Calcium: 9.5 mg/dL (ref 8.9–10.3)
Chloride: 102 mmol/L (ref 98–111)
Creatinine: 1.48 mg/dL — ABNORMAL HIGH (ref 0.61–1.24)
GFR, Estimated: 46 mL/min — ABNORMAL LOW (ref >60)
Glucose, Bld: 118 mg/dL — ABNORMAL HIGH (ref 70–99)
Potassium: 4.2 mmol/L (ref 3.5–5.1)
Sodium: 139 mmol/L (ref 135–145)
Total Bilirubin: 0.7 mg/dL (ref 0.3–1.2)
Total Protein: 5.9 g/dL — ABNORMAL LOW (ref 6.5–8.1)

## 2021-07-06 LAB — LACTATE DEHYDROGENASE: LDH: 203 U/L — ABNORMAL HIGH (ref 98–192)

## 2021-07-06 MED ORDER — BORTEZOMIB CHEMO SQ INJECTION 3.5 MG (2.5MG/ML)
1.3000 mg/m2 | Freq: Once | INTRAMUSCULAR | Status: AC
  Administered 2021-07-06: 2.25 mg via SUBCUTANEOUS
  Filled 2021-07-06: qty 0.9

## 2021-07-06 MED ORDER — PROCHLORPERAZINE MALEATE 10 MG PO TABS
10.0000 mg | ORAL_TABLET | Freq: Once | ORAL | Status: AC
  Administered 2021-07-06: 10 mg via ORAL
  Filled 2021-07-06: qty 1

## 2021-07-06 MED ORDER — DEXAMETHASONE 4 MG PO TABS
20.0000 mg | ORAL_TABLET | ORAL | Status: DC
  Administered 2021-07-06: 20 mg via ORAL
  Filled 2021-07-06: qty 5

## 2021-07-06 MED ORDER — DENOSUMAB 120 MG/1.7ML ~~LOC~~ SOLN
120.0000 mg | Freq: Once | SUBCUTANEOUS | Status: AC
  Administered 2021-07-06: 120 mg via SUBCUTANEOUS
  Filled 2021-07-06: qty 1.7

## 2021-07-06 NOTE — Patient Instructions (Signed)
[place] Discharge Instructions for Patients Receiving Chemotherapy  Today you received the following chemotherapy agents Velcade  To help prevent nausea and vomiting after your treatment, we encourage you to take your nausea medication as prescribed by MD.   If you develop nausea and vomiting that is not controlled by your nausea medication, call the clinic.   BELOW ARE SYMPTOMS THAT SHOULD BE REPORTED IMMEDIATELY: *FEVER GREATER THAN 100.5 F *CHILLS WITH OR WITHOUT FEVER NAUSEA AND VOMITING THAT IS NOT CONTROLLED WITH YOUR NAUSEA MEDICATION *UNUSUAL SHORTNESS OF BREATH *UNUSUAL BRUISING OR BLEEDING TENDERNESS IN MOUTH AND THROAT WITH OR WITHOUT PRESENCE OF ULCERS *URINARY PROBLEMS *BOWEL PROBLEMS UNUSUAL RASH Items with * indicate a potential emergency and should be followed up as soon as possible.  Feel free to call the clinic should you have any questions or concerns. The clinic phone number is [phone].  Please show the [place] at check-in to the Emergency Department and triage nurse.

## 2021-07-06 NOTE — Telephone Encounter (Signed)
Per 07/06/21 los - gave upcoming appointments - confirmed

## 2021-07-06 NOTE — Progress Notes (Signed)
Hematology and Oncology Follow Up Visit  [name] [id number] 28-Oct-1933 87 y.o. 07/06/2021   Principle Diagnosis:  IgG Kappa myeloma -- high risk due to t(4:21)   Current Therapy:        Velcade/Decadron -- started 10/09/2019, s/p cycle #`18 --treatment frequency changed to every other week on 01/21/2021 Xgeva 120 mg IM q 3 months -- next dose on 09/2021   Interim History: [name] is here today for follow-up.  It has been a while since we saw him.  We last saw him back in August.  He has had some prior issues.  The biggest issue has been COVID.  He got COVID after Labor Day.  His whole family had COVID.  He thinks he got COVID when he was in the hospital because of cardiac issues.  He lost his taste.  He was very congested.  Thankfully he was not admitted.  He also was in the hospital because of atrial fibrillation.  He is now on Tikosyn.  His myeloma has not been treated for quite a while.  He has been 6 weeks and I think since he has had a count of treatment for the myeloma.  Last myeloma studies that we have back in July showed no measurable M spike.  His IgG level was 682 mg/dL.  The Kappa light chain was 25.3 mg/dL.  This will get have to watch.  Has not had Xgeva for quite a while.  We will go ahead and give him some Xgeva today.  Currently, his performance status is probably ECOG 1.   Medications:  Allergies as of 07/06/2021   No Known Allergies      Medication List        Accurate as of July 06, 2021  8:21 AM. If you have any questions, ask your nurse or doctor.          amiodarone 200 MG tablet Commonly known as: PACERONE Take 2 tablets (400 mg total) by mouth 2 (two) times daily for 7 days, THEN 2 tablets (400 mg total) once daily for 7 days, THEN 1 tablet (200 mg total) once daily. Start taking on: June 02, 2021   aspirin EC 81 MG tablet Take 81 mg by mouth daily. Swallow whole.   atorvastatin 40 MG tablet Commonly known as:  LIPITOR Take 1 tablet (40 mg total) by mouth daily.   CINNAMON PO Take 1,000 mg by mouth daily.   clopidogrel 75 MG tablet Commonly known as: PLAVIX Take 1 tablet (75 mg total) by mouth daily with breakfast.   cyanocobalamin 1000 MCG tablet Take 1,000 mcg by mouth daily.   metoprolol succinate 50 MG 24 hr tablet Commonly known as: TOPROL-XL Take 1 tablet (50 mg total) by mouth 2 (two) times daily. Take with or immediately following a meal.   nitroGLYCERIN 0.4 MG SL tablet Commonly known as: NITROSTAT Place 1 tablet (0.4 mg total) under the tongue every 5 (five) minutes as needed for chest pain.   olmesartan 20 MG tablet Commonly known as: BENICAR Take 20 mg by mouth in the morning.   Symbicort 160-4.5 MCG/ACT inhaler Generic drug: budesonide-formoterol Inhale 2 puffs into the lungs 2 (two) times daily.   traMADol 50 MG tablet Commonly known as: ULTRAM Take 50 mg by mouth 2 (two) times daily as needed (for pain).        Allergies: No Known Allergies  Past Medical History, Surgical history, Social history, and Family History were reviewed and updated.  Review  of Systems: Review of Systems  Constitutional: Negative.   HENT: Negative.    Eyes: Negative.   Respiratory: Negative.    Cardiovascular:  Positive for palpitations.  Gastrointestinal: Negative.   Genitourinary: Negative.   Musculoskeletal: Negative.   Skin: Negative.   Neurological: Negative.   Endo/Heme/Allergies: Negative.   Psychiatric/Behavioral: Negative.      Physical Exam:  weight is 135 lb (61.2 kg). His oral temperature is 97.8 F (36.6 C). His blood pressure is 153/64 (abnormal) and his pulse is 70. His respiration is 20 and oxygen saturation is 98%.   Wt Readings from Last 3 Encounters:  07/06/21 135 lb (61.2 kg)  05/31/21 141 lb 8.6 oz (64.2 kg)  05/16/21 139 lb (63 kg)    Physical Exam Vitals reviewed.  HENT:     Head: Normocephalic and atraumatic.  Eyes:     Pupils: Pupils are  equal, round, and reactive to light.  Cardiovascular:     Rate and Rhythm: Normal rate and regular rhythm.     Heart sounds: Normal heart sounds.  Pulmonary:     Effort: Pulmonary effort is normal.     Breath sounds: Normal breath sounds.  Abdominal:     General: Bowel sounds are normal.     Palpations: Abdomen is soft.  Musculoskeletal:        General: No tenderness or deformity. Normal range of motion.     Cervical back: Normal range of motion.  Lymphadenopathy:     Cervical: No cervical adenopathy.  Skin:    General: Skin is warm and dry.     Findings: No erythema or rash.  Neurological:     Mental Status: He is alert and oriented to person, place, and time.  Psychiatric:        Behavior: Behavior normal.        Thought Content: Thought content normal.        Judgment: Judgment normal.     Lab Results  Component Value Date   WBC 8.7 07/06/2021   HGB 11.9 (L) 07/06/2021   HCT 36.9 (L) 07/06/2021   MCV 98.1 07/06/2021   PLT 223 07/06/2021   No results found for: FERRITIN, IRON, TIBC, UIBC, IRONPCTSAT Lab Results  Component Value Date   RBC 3.76 (L) 07/06/2021   Lab Results  Component Value Date   KPAFRELGTCHN 253.4 (H) 05/16/2021   LAMBDASER 13.8 05/16/2021   KAPLAMBRATIO 18.36 (H) 05/16/2021   Lab Results  Component Value Date   IGGSERUM 683 05/16/2021   IGA 125 05/16/2021   IGMSERUM 48 05/16/2021   Lab Results  Component Value Date   TOTALPROTELP 5.9 (L) 04/03/2021   ALBUMINELP 3.5 04/03/2021   A1GS 0.2 04/03/2021   A2GS 0.7 04/03/2021   BETS 0.8 04/03/2021   GAMS 0.7 04/03/2021   MSPIKE Not Observed 04/03/2021   SPEI Comment 04/03/2021     Chemistry      Component Value Date/Time   NA 139 07/06/2021 0753   K 4.2 07/06/2021 0753   CL 102 07/06/2021 0753   CO2 31 07/06/2021 0753   BUN 19 07/06/2021 0753   CREATININE 1.48 (H) 07/06/2021 0753      Component Value Date/Time   CALCIUM 9.5 07/06/2021 0753   ALKPHOS 60 07/06/2021 0753   AST 17  07/06/2021 0753   ALT 24 07/06/2021 0753   BILITOT 0.7 07/06/2021 0753       Impression and Plan: [name] is a very pleasant 87 yo caucasian gentleman with IgG kappa  myeloma.  So far, everything is going nicely with respect to the myeloma.  I hope that his cardiac status improves.  I am glad that he is feeling better.  We will go ahead with his Velcade.  If the Kappa light chain is higher, we will get have to add Faspro in my opinion.  I realize this will be a little bit of an inconvenience for him because of the frequency that he will need this but I believe this is what will be necessary.  Again, we will do Xgeva today.  We will plan to get him back to see [place] in another month.   [name], MD 10/13/20228:21 AM

## 2021-07-07 LAB — KAPPA/LAMBDA LIGHT CHAINS
Kappa free light chain: 247.7 mg/L — ABNORMAL HIGH (ref 3.3–19.4)
Kappa, lambda light chain ratio: 17.82 — ABNORMAL HIGH (ref 0.26–1.65)
Lambda free light chains: 13.9 mg/L (ref 5.7–26.3)

## 2021-07-07 LAB — IGG, IGA, IGM
IgA: 169 mg/dL (ref 61–437)
IgG (Immunoglobin G), Serum: 846 mg/dL (ref 603–1613)
IgM (Immunoglobulin M), Srm: 49 mg/dL (ref 15–143)

## 2021-07-10 LAB — PROTEIN ELECTROPHORESIS, SERUM, WITH REFLEX
A/G Ratio: 1.5 (ref 0.7–1.7)
Albumin ELP: 3.5 g/dL (ref 2.9–4.4)
Alpha-1-Globulin: 0.2 g/dL (ref 0.0–0.4)
Alpha-2-Globulin: 0.7 g/dL (ref 0.4–1.0)
Beta Globulin: 0.7 g/dL (ref 0.7–1.3)
Gamma Globulin: 0.7 g/dL (ref 0.4–1.8)
Globulin, Total: 2.4 g/dL (ref 2.2–3.9)
Total Protein ELP: 5.9 g/dL — ABNORMAL LOW (ref 6.0–8.5)

## 2021-07-20 ENCOUNTER — Ambulatory Visit: Payer: Medicare Other

## 2021-07-20 ENCOUNTER — Inpatient Hospital Stay: Payer: Medicare Other

## 2021-07-20 ENCOUNTER — Other Ambulatory Visit: Payer: Self-pay | Admitting: Family

## 2021-07-20 ENCOUNTER — Other Ambulatory Visit: Payer: Self-pay

## 2021-07-20 VITALS — BP 145/50 | HR 60 | Temp 98.3°F | Resp 16

## 2021-07-20 DIAGNOSIS — Z5111 Encounter for antineoplastic chemotherapy: Secondary | ICD-10-CM | POA: Diagnosis not present

## 2021-07-20 DIAGNOSIS — C9000 Multiple myeloma not having achieved remission: Secondary | ICD-10-CM

## 2021-07-20 LAB — CBC WITH DIFFERENTIAL (CANCER CENTER ONLY)
Abs Immature Granulocytes: 0.05 10*3/uL (ref 0.00–0.07)
Basophils Absolute: 0.1 10*3/uL (ref 0.0–0.1)
Basophils Relative: 1 %
Eosinophils Absolute: 0.5 10*3/uL (ref 0.0–0.5)
Eosinophils Relative: 5 %
HCT: 37.3 % — ABNORMAL LOW (ref 39.0–52.0)
Hemoglobin: 12.1 g/dL — ABNORMAL LOW (ref 13.0–17.0)
Immature Granulocytes: 1 %
Lymphocytes Relative: 18 %
Lymphs Abs: 1.7 10*3/uL (ref 0.7–4.0)
MCH: 32 pg (ref 26.0–34.0)
MCHC: 32.4 g/dL (ref 30.0–36.0)
MCV: 98.7 fL (ref 80.0–100.0)
Monocytes Absolute: 0.9 10*3/uL (ref 0.1–1.0)
Monocytes Relative: 9 %
Neutro Abs: 6.2 10*3/uL (ref 1.7–7.7)
Neutrophils Relative %: 66 %
Platelet Count: 200 10*3/uL (ref 150–400)
RBC: 3.78 MIL/uL — ABNORMAL LOW (ref 4.22–5.81)
RDW: 14.7 % (ref 11.5–15.5)
WBC Count: 9.4 10*3/uL (ref 4.0–10.5)
nRBC: 0 % (ref 0.0–0.2)

## 2021-07-20 LAB — CMP (CANCER CENTER ONLY)
ALT: 15 U/L (ref 0–44)
AST: 14 U/L — ABNORMAL LOW (ref 15–41)
Albumin: 3.6 g/dL (ref 3.5–5.0)
Alkaline Phosphatase: 51 U/L (ref 38–126)
Anion gap: 5 (ref 5–15)
BUN: 23 mg/dL (ref 8–23)
CO2: 31 mmol/L (ref 22–32)
Calcium: 9.5 mg/dL (ref 8.9–10.3)
Chloride: 104 mmol/L (ref 98–111)
Creatinine: 1.74 mg/dL — ABNORMAL HIGH (ref 0.61–1.24)
GFR, Estimated: 38 mL/min — ABNORMAL LOW (ref >60)
Glucose, Bld: 122 mg/dL — ABNORMAL HIGH (ref 70–99)
Potassium: 4.5 mmol/L (ref 3.5–5.1)
Sodium: 140 mmol/L (ref 135–145)
Total Bilirubin: 0.7 mg/dL (ref 0.3–1.2)
Total Protein: 5.9 g/dL — ABNORMAL LOW (ref 6.5–8.1)

## 2021-07-20 MED ORDER — PROCHLORPERAZINE MALEATE 10 MG PO TABS
10.0000 mg | ORAL_TABLET | Freq: Once | ORAL | Status: AC
  Administered 2021-07-20: 10 mg via ORAL
  Filled 2021-07-20: qty 1

## 2021-07-20 MED ORDER — BORTEZOMIB CHEMO SQ INJECTION 3.5 MG (2.5MG/ML)
1.3000 mg/m2 | Freq: Once | INTRAMUSCULAR | Status: AC
  Administered 2021-07-20: 2.25 mg via SUBCUTANEOUS
  Filled 2021-07-20: qty 0.9

## 2021-07-20 MED ORDER — DEXAMETHASONE 4 MG PO TABS
20.0000 mg | ORAL_TABLET | ORAL | Status: DC
  Administered 2021-07-20: 20 mg via ORAL
  Filled 2021-07-20: qty 5

## 2021-07-20 NOTE — Patient Instructions (Signed)
Bortezomib injection What is this medication? BORTEZOMIB (bor TEZ oh mib) targets proteins in cancer cells and stops the cancer cells from growing. It treats multiple myeloma and mantle cell lymphoma. This medicine may be used for other purposes; ask your health care provider or pharmacist if you have questions. COMMON BRAND NAME(S): Velcade What should I tell my care team before I take this medication? They need to know if you have any of these conditions: dehydration diabetes (high blood sugar) heart disease liver disease tingling of the fingers or toes or other nerve disorder an unusual or allergic reaction to bortezomib, mannitol, boron, other medicines, foods, dyes, or preservatives pregnant or trying to get pregnant breast-feeding How should I use this medication? This medicine is injected into a vein or under the skin. It is given by a health care provider in a hospital or clinic setting. Talk to your health care provider about the use of this medicine in children. Special care may be needed. Overdosage: If you think you have taken too much of this medicine contact a poison control center or emergency room at once. NOTE: This medicine is only for you. Do not share this medicine with others. What if I miss a dose? Keep appointments for follow-up doses. It is important not to miss your dose. Call your health care provider if you are unable to keep an appointment. What may interact with this medication? This medicine may interact with the following medications: ketoconazole rifampin This list may not describe all possible interactions. Give your health care provider a list of all the medicines, herbs, non-prescription drugs, or dietary supplements you use. Also tell them if you smoke, drink alcohol, or use illegal drugs. Some items may interact with your medicine. What should I watch for while using this medication? Your condition will be monitored carefully while you are receiving  this medicine. You may need blood work done while you are taking this medicine. You may get drowsy or dizzy. Do not drive, use machinery, or do anything that needs mental alertness until you know how this medicine affects you. Do not stand up or sit up quickly, especially if you are an older patient. This reduces the risk of dizzy or fainting spells This medicine may increase your risk of getting an infection. Call your health care provider for advice if you get a fever, chills, sore throat, or other symptoms of a cold or flu. Do not treat yourself. Try to avoid being around people who are sick. Check with your health care provider if you have severe diarrhea, nausea, and vomiting, or if you sweat a lot. The loss of too much body fluid may make it dangerous for you to take this medicine. Do not become pregnant while taking this medicine or for 7 months after stopping it. Women should inform their health care provider if they wish to become pregnant or think they might be pregnant. Men should not father a child while taking this medicine and for 4 months after stopping it. There is a potential for serious harm to an unborn child. Talk to your health care provider for more information. Do not breast-feed an infant while taking this medicine or for 2 months after stopping it. This medicine may make it more difficult to get pregnant or father a child. Talk to your health care provider if you are concerned about your fertility. What side effects may I notice from receiving this medication? Side effects that you should report to your doctor or health  care professional as soon as possible: allergic reactions (skin rash; itching or hives; swelling of the face, lips, or tongue) bleeding (bloody or black, tarry stools; red or dark brown urine; spitting up blood or brown material that looks like coffee grounds; red spots on the skin; unusual bruising or bleeding from the eye, gums, or nose) blurred vision or changes  in vision confusion constipation headache heart failure (trouble breathing; fast, irregular heartbeat; sudden weight gain; swelling of the ankles, feet, hands) infection (fever, chills, cough, sore throat, pain or trouble passing urine) lack or loss of appetite liver injury (dark yellow or brown urine; general ill feeling or flu-like symptoms; loss of appetite, right upper belly pain; yellowing of the eyes or skin) low blood pressure (dizziness; feeling faint or lightheaded, falls; unusually weak or tired) muscle cramps pain, redness, or irritation at site where injected pain, tingling, numbness in the hands or feet seizures trouble breathing unusual bruising or bleeding Side effects that usually do not require medical attention (report to your doctor or health care professional if they continue or are bothersome): diarrhea nausea stomach pain trouble sleeping vomiting This list may not describe all possible side effects. Call your doctor for medical advice about side effects. You may report side effects to FDA at 1-800-FDA-1088. Where should I keep my medication? This medicine is given in a hospital or clinic. It will not be stored at home. NOTE: This sheet is a summary. It may not cover all possible information. If you have questions about this medicine, talk to your doctor, pharmacist, or health care provider.  2022 Elsevier/Gold Standard (2020-09-01 13:22:53)  

## 2021-08-03 ENCOUNTER — Inpatient Hospital Stay: Payer: Medicare Other | Attending: Family

## 2021-08-03 ENCOUNTER — Inpatient Hospital Stay (HOSPITAL_BASED_OUTPATIENT_CLINIC_OR_DEPARTMENT_OTHER): Payer: Medicare Other | Admitting: Hematology & Oncology

## 2021-08-03 ENCOUNTER — Inpatient Hospital Stay: Payer: Medicare Other

## 2021-08-03 ENCOUNTER — Telehealth: Payer: Self-pay | Admitting: *Deleted

## 2021-08-03 ENCOUNTER — Encounter: Payer: Self-pay | Admitting: Hematology & Oncology

## 2021-08-03 ENCOUNTER — Other Ambulatory Visit: Payer: Self-pay

## 2021-08-03 VITALS — BP 125/76 | HR 81 | Temp 98.5°F | Resp 18 | Wt 137.5 lb

## 2021-08-03 DIAGNOSIS — C9000 Multiple myeloma not having achieved remission: Secondary | ICD-10-CM

## 2021-08-03 DIAGNOSIS — Z5111 Encounter for antineoplastic chemotherapy: Secondary | ICD-10-CM | POA: Diagnosis present

## 2021-08-03 LAB — CMP (CANCER CENTER ONLY)
ALT: 16 U/L (ref 0–44)
AST: 15 U/L (ref 15–41)
Albumin: 3.7 g/dL (ref 3.5–5.0)
Alkaline Phosphatase: 60 U/L (ref 38–126)
Anion gap: 7 (ref 5–15)
BUN: 26 mg/dL — ABNORMAL HIGH (ref 8–23)
CO2: 30 mmol/L (ref 22–32)
Calcium: 9.2 mg/dL (ref 8.9–10.3)
Chloride: 104 mmol/L (ref 98–111)
Creatinine: 1.66 mg/dL — ABNORMAL HIGH (ref 0.61–1.24)
GFR, Estimated: 40 mL/min — ABNORMAL LOW (ref >60)
Glucose, Bld: 113 mg/dL — ABNORMAL HIGH (ref 70–99)
Potassium: 4.4 mmol/L (ref 3.5–5.1)
Sodium: 141 mmol/L (ref 135–145)
Total Bilirubin: 0.7 mg/dL (ref 0.3–1.2)
Total Protein: 5.8 g/dL — ABNORMAL LOW (ref 6.5–8.1)

## 2021-08-03 LAB — CBC WITH DIFFERENTIAL (CANCER CENTER ONLY)
Abs Immature Granulocytes: 0.04 10*3/uL (ref 0.00–0.07)
Basophils Absolute: 0.1 10*3/uL (ref 0.0–0.1)
Basophils Relative: 1 %
Eosinophils Absolute: 0.7 10*3/uL — ABNORMAL HIGH (ref 0.0–0.5)
Eosinophils Relative: 7 %
HCT: 35.7 % — ABNORMAL LOW (ref 39.0–52.0)
Hemoglobin: 11.1 g/dL — ABNORMAL LOW (ref 13.0–17.0)
Immature Granulocytes: 0 %
Lymphocytes Relative: 18 %
Lymphs Abs: 1.6 10*3/uL (ref 0.7–4.0)
MCH: 31.4 pg (ref 26.0–34.0)
MCHC: 31.1 g/dL (ref 30.0–36.0)
MCV: 100.8 fL — ABNORMAL HIGH (ref 80.0–100.0)
Monocytes Absolute: 1 10*3/uL (ref 0.1–1.0)
Monocytes Relative: 11 %
Neutro Abs: 5.6 10*3/uL (ref 1.7–7.7)
Neutrophils Relative %: 63 %
Platelet Count: 205 10*3/uL (ref 150–400)
RBC: 3.54 MIL/uL — ABNORMAL LOW (ref 4.22–5.81)
RDW: 14.9 % (ref 11.5–15.5)
WBC Count: 9.1 10*3/uL (ref 4.0–10.5)
nRBC: 0 % (ref 0.0–0.2)

## 2021-08-03 LAB — LACTATE DEHYDROGENASE: LDH: 207 U/L — ABNORMAL HIGH (ref 98–192)

## 2021-08-03 MED ORDER — BORTEZOMIB CHEMO SQ INJECTION 3.5 MG (2.5MG/ML)
1.3000 mg/m2 | Freq: Once | INTRAMUSCULAR | Status: AC
  Administered 2021-08-03: 2.25 mg via SUBCUTANEOUS
  Filled 2021-08-03: qty 0.9

## 2021-08-03 MED ORDER — DEXAMETHASONE 4 MG PO TABS
20.0000 mg | ORAL_TABLET | ORAL | Status: DC
  Administered 2021-08-03: 20 mg via ORAL
  Filled 2021-08-03: qty 5

## 2021-08-03 MED ORDER — PROCHLORPERAZINE MALEATE 10 MG PO TABS
10.0000 mg | ORAL_TABLET | Freq: Once | ORAL | Status: AC
  Administered 2021-08-03: 10 mg via ORAL
  Filled 2021-08-03: qty 1

## 2021-08-03 NOTE — Progress Notes (Signed)
Hematology and Oncology Follow Up Visit  Shawn Bauer [facility] [id number] 1934-03-23 87 y.o. 08/03/2021   Principle Diagnosis:  IgG Kappa myeloma -- high risk due to t(4:21)   Current Therapy:        Velcade/Decadron -- started 10/09/2019, s/p cycle #`20--treatment frequency changed to every other week on 01/21/2021 Xgeva 120 mg IM q 3 months -- next dose on 09/2021   Interim History: Shawn Bauer is here today for follow-up.  He is feeling pretty well.  He really has had no specific complaints.  He did have COVID in September.  We had to hold his treatment for a little bit.  He is looking forward to Thanksgiving.  Sound like his wife is going to be making a nice Thanksgiving meal.  He does have the atrial fibrillation.  He seems to be in sinus rhythm on exam.  I think he is on Tikosyn.  As far as myeloma is concerned, last monoclonal studies back in October did not show monoclonal spike.  His IgG level was 846 mg/dL.  The Kappa light chain was 25 mg/dL.  Everything is holding steady.  His appetite has been good.  There is been no change in bowel or bladder habits.  He has had no leg swelling.  There has been no rashes.  He has had no headache.  Overall, I would say his performance status is ECOG 1.    Medications:  Allergies as of 08/03/2021   No Known Allergies      Medication List        Accurate as of August 03, 2021 10:03 AM. If you have any questions, ask your nurse or doctor.          amiodarone 200 MG tablet Commonly known as: PACERONE Take 2 tablets (400 mg total) by mouth 2 (two) times daily for 7 days, THEN 2 tablets (400 mg total) once daily for 7 days, THEN 1 tablet (200 mg total) once daily. Start taking on: June 02, 2021   aspirin EC 81 MG tablet Take 81 mg by mouth daily. Swallow whole.   atorvastatin 40 MG tablet Commonly known as: LIPITOR Take 1 tablet (40 mg total) by mouth daily.   CINNAMON PO Take 1,000 mg by mouth daily.   clopidogrel 75 MG  tablet Commonly known as: PLAVIX Take 1 tablet (75 mg total) by mouth daily with breakfast.   cyanocobalamin 1000 MCG tablet Take 1,000 mcg by mouth daily.   metoprolol succinate 50 MG 24 hr tablet Commonly known as: TOPROL-XL Take 1 tablet (50 mg total) by mouth 2 (two) times daily. Take with or immediately following a meal.   nitroGLYCERIN 0.4 MG SL tablet Commonly known as: NITROSTAT Place 1 tablet (0.4 mg total) under the tongue every 5 (five) minutes as needed for chest pain.   olmesartan 20 MG tablet Commonly known as: BENICAR Take 20 mg by mouth in the morning.   Symbicort 160-4.5 MCG/ACT inhaler Generic drug: budesonide-formoterol Inhale 2 puffs into the lungs 2 (two) times daily.   traMADol 50 MG tablet Commonly known as: ULTRAM Take 50 mg by mouth 2 (two) times daily as needed (for pain).        Allergies: No Known Allergies  Past Medical History, Surgical history, Social history, and Family History were reviewed and updated.  Review of Systems: Review of Systems  Constitutional: Negative.   HENT: Negative.    Eyes: Negative.   Respiratory: Negative.    Cardiovascular:  Positive for palpitations.  Gastrointestinal: Negative.   Genitourinary: Negative.   Musculoskeletal: Negative.   Skin: Negative.   Neurological: Negative.   Endo/Heme/Allergies: Negative.   Psychiatric/Behavioral: Negative.      Physical Exam:  weight is 137 lb 8 oz (62.4 kg). His oral temperature is 98.5 F (36.9 C). His blood pressure is 125/76 and his pulse is 81. His respiration is 18 and oxygen saturation is 100%.   Wt Readings from Last 3 Encounters:  08/03/21 137 lb 8 oz (62.4 kg)  07/06/21 135 lb (61.2 kg)  05/31/21 141 lb 8.6 oz (64.2 kg)    Physical Exam Vitals reviewed.  HENT:     Head: Normocephalic and atraumatic.  Eyes:     Pupils: Pupils are equal, round, and reactive to light.  Cardiovascular:     Rate and Rhythm: Normal rate and regular rhythm.     Heart  sounds: Normal heart sounds.  Pulmonary:     Effort: Pulmonary effort is normal.     Breath sounds: Normal breath sounds.  Abdominal:     General: Bowel sounds are normal.     Palpations: Abdomen is soft.  Musculoskeletal:        General: No tenderness or deformity. Normal range of motion.     Cervical back: Normal range of motion.  Lymphadenopathy:     Cervical: No cervical adenopathy.  Skin:    General: Skin is warm and dry.     Findings: No erythema or rash.  Neurological:     Mental Status: He is alert and oriented to person, place, and time.  Psychiatric:        Behavior: Behavior normal.        Thought Content: Thought content normal.        Judgment: Judgment normal.     Lab Results  Component Value Date   WBC 9.1 08/03/2021   HGB 11.1 (L) 08/03/2021   HCT 35.7 (L) 08/03/2021   MCV 100.8 (H) 08/03/2021   PLT 205 08/03/2021   No results found for: FERRITIN, IRON, TIBC, UIBC, IRONPCTSAT Lab Results  Component Value Date   RBC 3.54 (L) 08/03/2021   Lab Results  Component Value Date   KPAFRELGTCHN 247.7 (H) 07/06/2021   LAMBDASER 13.9 07/06/2021   KAPLAMBRATIO 17.82 (H) 07/06/2021   Lab Results  Component Value Date   IGGSERUM 846 07/06/2021   IGA 169 07/06/2021   IGMSERUM 49 07/06/2021   Lab Results  Component Value Date   TOTALPROTELP 5.9 (L) 07/06/2021   ALBUMINELP 3.5 07/06/2021   A1GS 0.2 07/06/2021   A2GS 0.7 07/06/2021   BETS 0.7 07/06/2021   GAMS 0.7 07/06/2021   MSPIKE Not Observed 07/06/2021   SPEI Comment 04/03/2021     Chemistry      Component Value Date/Time   NA 141 08/03/2021 0919   K 4.4 08/03/2021 0919   CL 104 08/03/2021 0919   CO2 30 08/03/2021 0919   BUN 26 (H) 08/03/2021 0919   CREATININE 1.66 (H) 08/03/2021 0919      Component Value Date/Time   CALCIUM 9.2 08/03/2021 0919   ALKPHOS 60 08/03/2021 0919   AST 15 08/03/2021 0919   ALT 16 08/03/2021 0919   BILITOT 0.7 08/03/2021 0919       Impression and Plan: [name] is a very pleasant 87 yo caucasian gentleman with IgG kappa myeloma.  So far, everything is going nicely with respect to the myeloma.  I hope that his cardiac status improves.  I am glad  that he is feeling better.  We will go ahead with his Velcade.  If the Kappa light chain is higher, we will think about adding Faspro in my opinion.  I realize this will be a little bit of an inconvenience for him because of the frequency that he will need this but I believe this is what will be necessary.  I am just happy that his quality life is doing well.  I am sure he will enjoy the holidays.  He will come back in 2 weeks for his next treatment.  We will see him back in 1 month.   [name], MD 11/10/202210:03 AM

## 2021-08-03 NOTE — Telephone Encounter (Signed)
Per 08/03/21 los - gave upcoming appointments - confirmed

## 2021-08-03 NOTE — Patient Instructions (Signed)
[place] Discharge Instructions for Patients Receiving Chemotherapy  Today you received the following chemotherapy agents Velcade  To help prevent nausea and vomiting after your treatment, we encourage you to take your nausea medication as prescribed by MD.   If you develop nausea and vomiting that is not controlled by your nausea medication, call the clinic.   BELOW ARE SYMPTOMS THAT SHOULD BE REPORTED IMMEDIATELY: *FEVER GREATER THAN 100.5 F *CHILLS WITH OR WITHOUT FEVER NAUSEA AND VOMITING THAT IS NOT CONTROLLED WITH YOUR NAUSEA MEDICATION *UNUSUAL SHORTNESS OF BREATH *UNUSUAL BRUISING OR BLEEDING TENDERNESS IN MOUTH AND THROAT WITH OR WITHOUT PRESENCE OF ULCERS *URINARY PROBLEMS *BOWEL PROBLEMS UNUSUAL RASH Items with * indicate a potential emergency and should be followed up as soon as possible.  Feel free to call the clinic should you have any questions or concerns. The clinic phone number is [phone].  Please show the [place] at check-in to the Emergency Department and triage nurse.

## 2021-08-03 NOTE — Progress Notes (Signed)
Reviewed labs with Dr. Marin Olp and pt ok to treat with Creatinine 1.66

## 2021-08-04 LAB — KAPPA/LAMBDA LIGHT CHAINS
Kappa free light chain: 261.4 mg/L — ABNORMAL HIGH (ref 3.3–19.4)
Kappa, lambda light chain ratio: 26.14 — ABNORMAL HIGH (ref 0.26–1.65)
Lambda free light chains: 10 mg/L (ref 5.7–26.3)

## 2021-08-04 LAB — IGG, IGA, IGM
IgA: 156 mg/dL (ref 61–437)
IgG (Immunoglobin G), Serum: 707 mg/dL (ref 603–1613)
IgM (Immunoglobulin M), Srm: 39 mg/dL (ref 15–143)

## 2021-08-06 LAB — PROTEIN ELECTROPHORESIS, SERUM, WITH REFLEX
A/G Ratio: 1.4 (ref 0.7–1.7)
Albumin ELP: 3.3 g/dL (ref 2.9–4.4)
Alpha-1-Globulin: 0.3 g/dL (ref 0.0–0.4)
Alpha-2-Globulin: 0.8 g/dL (ref 0.4–1.0)
Beta Globulin: 0.8 g/dL (ref 0.7–1.3)
Gamma Globulin: 0.6 g/dL (ref 0.4–1.8)
Globulin, Total: 2.4 g/dL (ref 2.2–3.9)
Total Protein ELP: 5.7 g/dL — ABNORMAL LOW (ref 6.0–8.5)

## 2021-08-16 ENCOUNTER — Other Ambulatory Visit: Payer: Self-pay

## 2021-08-16 ENCOUNTER — Inpatient Hospital Stay: Payer: Medicare Other

## 2021-08-16 VITALS — BP 141/50 | HR 70 | Temp 97.7°F | Resp 17

## 2021-08-16 DIAGNOSIS — Z5111 Encounter for antineoplastic chemotherapy: Secondary | ICD-10-CM | POA: Diagnosis not present

## 2021-08-16 DIAGNOSIS — C9000 Multiple myeloma not having achieved remission: Secondary | ICD-10-CM

## 2021-08-16 MED ORDER — PROCHLORPERAZINE MALEATE 10 MG PO TABS
10.0000 mg | ORAL_TABLET | Freq: Once | ORAL | Status: AC
  Administered 2021-08-16: 10 mg via ORAL
  Filled 2021-08-16: qty 1

## 2021-08-16 MED ORDER — DEXAMETHASONE 4 MG PO TABS
20.0000 mg | ORAL_TABLET | ORAL | Status: DC
  Administered 2021-08-16: 20 mg via ORAL
  Filled 2021-08-16: qty 5

## 2021-08-16 MED ORDER — BORTEZOMIB CHEMO SQ INJECTION 3.5 MG (2.5MG/ML)
1.3000 mg/m2 | Freq: Once | INTRAMUSCULAR | Status: AC
  Administered 2021-08-16: 2.25 mg via SUBCUTANEOUS
  Filled 2021-08-16: qty 0.9

## 2021-08-16 NOTE — Progress Notes (Signed)
Per Dr Marin Olp, ok to treat using 08/03/2021 labs. No need for labs today. [place] with creatinine of 1.66 per MD. dph

## 2021-08-16 NOTE — Patient Instructions (Signed)
Bortezomib injection What is this medication? BORTEZOMIB (bor TEZ oh mib) targets proteins in cancer cells and stops the cancer cells from growing. It treats multiple myeloma and mantle cell lymphoma. This medicine may be used for other purposes; ask your health care provider or pharmacist if you have questions. COMMON BRAND NAME(S): Velcade What should I tell my care team before I take this medication? They need to know if you have any of these conditions: dehydration diabetes (high blood sugar) heart disease liver disease tingling of the fingers or toes or other nerve disorder an unusual or allergic reaction to bortezomib, mannitol, boron, other medicines, foods, dyes, or preservatives pregnant or trying to get pregnant breast-feeding How should I use this medication? This medicine is injected into a vein or under the skin. It is given by a health care provider in a hospital or clinic setting. Talk to your health care provider about the use of this medicine in children. Special care may be needed. Overdosage: If you think you have taken too much of this medicine contact a poison control center or emergency room at once. NOTE: This medicine is only for you. Do not share this medicine with others. What if I miss a dose? Keep appointments for follow-up doses. It is important not to miss your dose. Call your health care provider if you are unable to keep an appointment. What may interact with this medication? This medicine may interact with the following medications: ketoconazole rifampin This list may not describe all possible interactions. Give your health care provider a list of all the medicines, herbs, non-prescription drugs, or dietary supplements you use. Also tell them if you smoke, drink alcohol, or use illegal drugs. Some items may interact with your medicine. What should I watch for while using this medication? Your condition will be monitored carefully while you are receiving  this medicine. You may need blood work done while you are taking this medicine. You may get drowsy or dizzy. Do not drive, use machinery, or do anything that needs mental alertness until you know how this medicine affects you. Do not stand up or sit up quickly, especially if you are an older patient. This reduces the risk of dizzy or fainting spells This medicine may increase your risk of getting an infection. Call your health care provider for advice if you get a fever, chills, sore throat, or other symptoms of a cold or flu. Do not treat yourself. Try to avoid being around people who are sick. Check with your health care provider if you have severe diarrhea, nausea, and vomiting, or if you sweat a lot. The loss of too much body fluid may make it dangerous for you to take this medicine. Do not become pregnant while taking this medicine or for 7 months after stopping it. Women should inform their health care provider if they wish to become pregnant or think they might be pregnant. Men should not father a child while taking this medicine and for 4 months after stopping it. There is a potential for serious harm to an unborn child. Talk to your health care provider for more information. Do not breast-feed an infant while taking this medicine or for 2 months after stopping it. This medicine may make it more difficult to get pregnant or father a child. Talk to your health care provider if you are concerned about your fertility. What side effects may I notice from receiving this medication? Side effects that you should report to your doctor or health  care professional as soon as possible: ?allergic reactions (skin rash; itching or hives; swelling of the face, lips, or tongue) ?bleeding (bloody or black, tarry stools; red or dark brown urine; spitting up blood or brown material that looks like coffee grounds; red spots on the skin; unusual bruising or bleeding from the eye, gums, or nose) ?blurred vision or changes  in vision ?confusion ?constipation ?headache ?heart failure (trouble breathing; fast, irregular heartbeat; sudden weight gain; swelling of the ankles, feet, hands) ?infection (fever, chills, cough, sore throat, pain or trouble passing urine) ?lack or loss of appetite ?liver injury (dark yellow or brown urine; general ill feeling or flu-like symptoms; loss of appetite, right upper belly pain; yellowing of the eyes or skin) ?low blood pressure (dizziness; feeling faint or lightheaded, falls; unusually weak or tired) ?muscle cramps ?pain, redness, or irritation at site where injected ?pain, tingling, numbness in the hands or feet ?seizures ?trouble breathing ?unusual bruising or bleeding ?Side effects that usually do not require medical attention (report to your doctor or health care professional if they continue or are bothersome): ?diarrhea ?nausea ?stomach pain ?trouble sleeping ?vomiting ?This list may not describe all possible side effects. Call your doctor for medical advice about side effects. You may report side effects to FDA at 1-800-FDA-1088. ?Where should I keep my medication? ?This medicine is given in a hospital or clinic. It will not be stored at home. ?NOTE: This sheet is a summary. It may not cover all possible information. If you have questions about this medicine, talk to your doctor, pharmacist, or health care provider. ?? 2022 Elsevier/Gold Standard (2020-09-01 00:00:00) ?

## 2021-08-31 ENCOUNTER — Telehealth: Payer: Self-pay | Admitting: *Deleted

## 2021-08-31 ENCOUNTER — Other Ambulatory Visit: Payer: Self-pay

## 2021-08-31 ENCOUNTER — Inpatient Hospital Stay: Payer: Medicare Other | Attending: Family

## 2021-08-31 ENCOUNTER — Inpatient Hospital Stay: Payer: Medicare Other

## 2021-08-31 ENCOUNTER — Encounter: Payer: Self-pay | Admitting: Hematology & Oncology

## 2021-08-31 ENCOUNTER — Inpatient Hospital Stay (HOSPITAL_BASED_OUTPATIENT_CLINIC_OR_DEPARTMENT_OTHER): Payer: Medicare Other | Admitting: Hematology & Oncology

## 2021-08-31 VITALS — BP 143/53 | HR 73 | Temp 98.4°F | Resp 18 | Wt 138.0 lb

## 2021-08-31 DIAGNOSIS — C9000 Multiple myeloma not having achieved remission: Secondary | ICD-10-CM

## 2021-08-31 DIAGNOSIS — N289 Disorder of kidney and ureter, unspecified: Secondary | ICD-10-CM | POA: Diagnosis not present

## 2021-08-31 DIAGNOSIS — Z5111 Encounter for antineoplastic chemotherapy: Secondary | ICD-10-CM | POA: Insufficient documentation

## 2021-08-31 LAB — CBC WITH DIFFERENTIAL (CANCER CENTER ONLY)
Abs Immature Granulocytes: 0.04 10*3/uL (ref 0.00–0.07)
Basophils Absolute: 0.1 10*3/uL (ref 0.0–0.1)
Basophils Relative: 1 %
Eosinophils Absolute: 0.5 10*3/uL (ref 0.0–0.5)
Eosinophils Relative: 6 %
HCT: 33.8 % — ABNORMAL LOW (ref 39.0–52.0)
Hemoglobin: 10.5 g/dL — ABNORMAL LOW (ref 13.0–17.0)
Immature Granulocytes: 1 %
Lymphocytes Relative: 18 %
Lymphs Abs: 1.5 10*3/uL (ref 0.7–4.0)
MCH: 30.8 pg (ref 26.0–34.0)
MCHC: 31.1 g/dL (ref 30.0–36.0)
MCV: 99.1 fL (ref 80.0–100.0)
Monocytes Absolute: 0.8 10*3/uL (ref 0.1–1.0)
Monocytes Relative: 9 %
Neutro Abs: 5.5 10*3/uL (ref 1.7–7.7)
Neutrophils Relative %: 65 %
Platelet Count: 201 10*3/uL (ref 150–400)
RBC: 3.41 MIL/uL — ABNORMAL LOW (ref 4.22–5.81)
RDW: 14.8 % (ref 11.5–15.5)
WBC Count: 8.5 10*3/uL (ref 4.0–10.5)
nRBC: 0 % (ref 0.0–0.2)

## 2021-08-31 LAB — CMP (CANCER CENTER ONLY)
ALT: 17 U/L (ref 0–44)
AST: 15 U/L (ref 15–41)
Albumin: 3.5 g/dL (ref 3.5–5.0)
Alkaline Phosphatase: 55 U/L (ref 38–126)
Anion gap: 8 (ref 5–15)
BUN: 21 mg/dL (ref 8–23)
CO2: 30 mmol/L (ref 22–32)
Calcium: 8.9 mg/dL (ref 8.9–10.3)
Chloride: 104 mmol/L (ref 98–111)
Creatinine: 1.54 mg/dL — ABNORMAL HIGH (ref 0.61–1.24)
GFR, Estimated: 44 mL/min — ABNORMAL LOW (ref >60)
Glucose, Bld: 144 mg/dL — ABNORMAL HIGH (ref 70–99)
Potassium: 4 mmol/L (ref 3.5–5.1)
Sodium: 142 mmol/L (ref 135–145)
Total Bilirubin: 0.6 mg/dL (ref 0.3–1.2)
Total Protein: 5.8 g/dL — ABNORMAL LOW (ref 6.5–8.1)

## 2021-08-31 MED ORDER — PROCHLORPERAZINE MALEATE 10 MG PO TABS
10.0000 mg | ORAL_TABLET | Freq: Once | ORAL | Status: AC
  Administered 2021-08-31: 10 mg via ORAL
  Filled 2021-08-31: qty 1

## 2021-08-31 MED ORDER — DEXAMETHASONE 4 MG PO TABS
20.0000 mg | ORAL_TABLET | ORAL | Status: DC
  Administered 2021-08-31: 20 mg via ORAL
  Filled 2021-08-31: qty 5

## 2021-08-31 MED ORDER — BORTEZOMIB CHEMO SQ INJECTION 3.5 MG (2.5MG/ML)
1.3000 mg/m2 | Freq: Once | INTRAMUSCULAR | Status: AC
  Administered 2021-08-31: 2.25 mg via SUBCUTANEOUS
  Filled 2021-08-31: qty 0.9

## 2021-08-31 NOTE — Telephone Encounter (Signed)
Per 08/31/21 los - gave upcoming appointments - confirmed

## 2021-08-31 NOTE — Patient Instructions (Signed)
[place] Discharge Instructions for Patients Receiving Chemotherapy  Today you received the following chemotherapy agents Velcade  To help prevent nausea and vomiting after your treatment, we encourage you to take your nausea medication as prescribed by MD.   If you develop nausea and vomiting that is not controlled by your nausea medication, call the clinic.   BELOW ARE SYMPTOMS THAT SHOULD BE REPORTED IMMEDIATELY: *FEVER GREATER THAN 100.5 F *CHILLS WITH OR WITHOUT FEVER NAUSEA AND VOMITING THAT IS NOT CONTROLLED WITH YOUR NAUSEA MEDICATION *UNUSUAL SHORTNESS OF BREATH *UNUSUAL BRUISING OR BLEEDING TENDERNESS IN MOUTH AND THROAT WITH OR WITHOUT PRESENCE OF ULCERS *URINARY PROBLEMS *BOWEL PROBLEMS UNUSUAL RASH Items with * indicate a potential emergency and should be followed up as soon as possible.  Feel free to call the clinic should you have any questions or concerns. The clinic phone number is [phone].  Please show the [place] at check-in to the Emergency Department and triage nurse.

## 2021-08-31 NOTE — Progress Notes (Signed)
Hematology and Oncology Follow Up Visit  Shawn Bauer [facility] [id number] [birth year] 87 y.o. 08/31/2021   Principle Diagnosis:  IgG Kappa myeloma -- high risk due to t(4:21)   Current Therapy:        Velcade/Decadron -- started 10/09/2019, s/p cycle #`20--treatment frequency changed to every other week on 01/21/2021 Xgeva 120 mg IM q 3 months -- next dose on 09/2021   Interim History: Shawn Bauer is here today for follow-up.  He is doing quite nicely.  He had a very nice Thanksgiving.  His wife cook and he ate.  He has had no complaints.  He has had no problems with pain.  There is no change in bowel or bladder habits.  He has had no rashes.  He has had no leg swelling.  His last monoclonal spike did not show a protein in his blood.  His Kappa light chain was little bit higher though.  It was up to 26 mg/dL.  I noted that his hemoglobin is little bit lower.  We will have to watch this closely.  He has had no change in bowel or bladder habits.  He has had no cough or shortness of breath.  He has had no fever.  Overall, I would say his performance status is ECOG 1.     Medications:  Allergies as of 08/31/2021   No Known Allergies      Medication List        Accurate as of August 31, 2021  8:08 AM. If you have any questions, ask your nurse or doctor.          amiodarone 200 MG tablet Commonly known as: PACERONE Take 2 tablets (400 mg total) by mouth 2 (two) times daily for 7 days, THEN 2 tablets (400 mg total) once daily for 7 days, THEN 1 tablet (200 mg total) once daily. Start taking on: June 02, 2021   aspirin EC 81 MG tablet Take 81 mg by mouth daily. Swallow whole.   atorvastatin 40 MG tablet Commonly known as: LIPITOR Take 1 tablet (40 mg total) by mouth daily.   CINNAMON PO Take 1,000 mg by mouth daily.   clopidogrel 75 MG tablet Commonly known as: PLAVIX Take 1 tablet (75 mg total) by mouth daily with breakfast.   cyanocobalamin 1000 MCG tablet Take  1,000 mcg by mouth daily.   metoprolol succinate 25 MG 24 hr tablet Commonly known as: TOPROL-XL Take 25 mg by mouth daily. What changed: Another medication with the same name was removed. Continue taking this medication, and follow the directions you see here. Changed by: Shawn Napoleon, MD   nitroGLYCERIN 0.4 MG SL tablet Commonly known as: NITROSTAT Place 1 tablet (0.4 mg total) under the tongue every 5 (five) minutes as needed for chest pain.   olmesartan 20 MG tablet Commonly known as: BENICAR Take 20 mg by mouth in the morning.   Symbicort 160-4.5 MCG/ACT inhaler Generic drug: budesonide-formoterol Inhale 2 puffs into the lungs 2 (two) times daily.   traMADol 50 MG tablet Commonly known as: ULTRAM Take 50 mg by mouth 2 (two) times daily as needed (for pain).        Allergies: No Known Allergies  Past Medical History, Surgical history, Social history, and Family History were reviewed and updated.  Review of Systems: Review of Systems  Constitutional: Negative.   HENT: Negative.    Eyes: Negative.   Respiratory: Negative.    Cardiovascular:  Positive for palpitations.  Gastrointestinal: Negative.  Genitourinary: Negative.   Musculoskeletal: Negative.   Skin: Negative.   Neurological: Negative.   Endo/Heme/Allergies: Negative.   Psychiatric/Behavioral: Negative.      Physical Exam:  weight is 138 lb (62.6 kg). His oral temperature is 98.4 F (36.9 C). His blood pressure is 143/53 (abnormal) and his pulse is 73. His respiration is 18 and oxygen saturation is 99%.   Wt Readings from Last 3 Encounters:  08/31/21 138 lb (62.6 kg)  08/03/21 137 lb 8 oz (62.4 kg)  07/06/21 135 lb (61.2 kg)    Physical Exam Vitals reviewed.  HENT:     Head: Normocephalic and atraumatic.  Eyes:     Pupils: Pupils are equal, round, and reactive to light.  Cardiovascular:     Rate and Rhythm: Normal rate and regular rhythm.     Heart sounds: Normal heart sounds.   Pulmonary:     Effort: Pulmonary effort is normal.     Breath sounds: Normal breath sounds.  Abdominal:     General: Bowel sounds are normal.     Palpations: Abdomen is soft.  Musculoskeletal:        General: No tenderness or deformity. Normal range of motion.     Cervical back: Normal range of motion.  Lymphadenopathy:     Cervical: No cervical adenopathy.  Skin:    General: Skin is warm and dry.     Findings: No erythema or rash.  Neurological:     Mental Status: He is alert and oriented to person, place, and time.  Psychiatric:        Behavior: Behavior normal.        Thought Content: Thought content normal.        Judgment: Judgment normal.     Lab Results  Component Value Date   WBC 8.5 08/31/2021   HGB 10.5 (L) 08/31/2021   HCT 33.8 (L) 08/31/2021   MCV 99.1 08/31/2021   PLT 201 08/31/2021   No results found for: FERRITIN, IRON, TIBC, UIBC, IRONPCTSAT Lab Results  Component Value Date   RBC 3.41 (L) 08/31/2021   Lab Results  Component Value Date   KPAFRELGTCHN 261.4 (H) 08/03/2021   LAMBDASER 10.0 08/03/2021   KAPLAMBRATIO 26.14 (H) 08/03/2021   Lab Results  Component Value Date   IGGSERUM 707 08/03/2021   IGA 156 08/03/2021   IGMSERUM 39 08/03/2021   Lab Results  Component Value Date   TOTALPROTELP 5.7 (L) 08/03/2021   ALBUMINELP 3.3 08/03/2021   A1GS 0.3 08/03/2021   A2GS 0.8 08/03/2021   BETS 0.8 08/03/2021   GAMS 0.6 08/03/2021   MSPIKE Not Observed 08/03/2021   SPEI Comment 04/03/2021     Chemistry      Component Value Date/Time   NA 141 08/03/2021 0919   K 4.4 08/03/2021 0919   CL 104 08/03/2021 0919   CO2 30 08/03/2021 0919   BUN 26 (H) 08/03/2021 0919   CREATININE 1.66 (H) 08/03/2021 0919      Component Value Date/Time   CALCIUM 9.2 08/03/2021 0919   ALKPHOS 60 08/03/2021 0919   AST 15 08/03/2021 0919   ALT 16 08/03/2021 0919   BILITOT 0.7 08/03/2021 0919       Impression and Plan: Shawn Bauer is a very pleasant 87 yo  caucasian gentleman with IgG kappa myeloma.  So far, everything is going nicely with respect to the myeloma.  I hope that his cardiac status improves.  Again, we will have to watch the hemoglobin.  He might  be trending down because of the treatment itself.  He does have a little bit of renal insufficiency.  I sent, we can always utilize Aranesp if he needed to.  We will go ahead and treat today.  He will get another treatment in 2 weeks.  We will then see him back in January.    Shawn Napoleon, MD 12/8/20228:08 AM

## 2021-09-01 LAB — KAPPA/LAMBDA LIGHT CHAINS
Kappa free light chain: 200.8 mg/L — ABNORMAL HIGH (ref 3.3–19.4)
Kappa, lambda light chain ratio: 16.73 — ABNORMAL HIGH (ref 0.26–1.65)
Lambda free light chains: 12 mg/L (ref 5.7–26.3)

## 2021-09-01 LAB — IGG, IGA, IGM
IgA: 142 mg/dL (ref 61–437)
IgG (Immunoglobin G), Serum: 708 mg/dL (ref 603–1613)
IgM (Immunoglobulin M), Srm: 36 mg/dL (ref 15–143)

## 2021-09-04 LAB — PROTEIN ELECTROPHORESIS, SERUM, WITH REFLEX
A/G Ratio: 1.2 (ref 0.7–1.7)
Albumin ELP: 3.2 g/dL (ref 2.9–4.4)
Alpha-1-Globulin: 0.2 g/dL (ref 0.0–0.4)
Alpha-2-Globulin: 0.8 g/dL (ref 0.4–1.0)
Beta Globulin: 0.8 g/dL (ref 0.7–1.3)
Gamma Globulin: 0.7 g/dL (ref 0.4–1.8)
Globulin, Total: 2.6 g/dL (ref 2.2–3.9)
Total Protein ELP: 5.8 g/dL — ABNORMAL LOW (ref 6.0–8.5)

## 2021-09-13 ENCOUNTER — Other Ambulatory Visit: Payer: Self-pay

## 2021-09-13 DIAGNOSIS — C9000 Multiple myeloma not having achieved remission: Secondary | ICD-10-CM

## 2021-09-14 ENCOUNTER — Other Ambulatory Visit: Payer: Self-pay

## 2021-09-14 ENCOUNTER — Inpatient Hospital Stay: Payer: Medicare Other

## 2021-09-14 VITALS — BP 144/55 | HR 86 | Temp 98.4°F | Resp 16

## 2021-09-14 DIAGNOSIS — C9000 Multiple myeloma not having achieved remission: Secondary | ICD-10-CM

## 2021-09-14 DIAGNOSIS — Z5111 Encounter for antineoplastic chemotherapy: Secondary | ICD-10-CM | POA: Diagnosis not present

## 2021-09-14 LAB — CBC WITH DIFFERENTIAL (CANCER CENTER ONLY)
Abs Immature Granulocytes: 0.06 10*3/uL (ref 0.00–0.07)
Basophils Absolute: 0.1 10*3/uL (ref 0.0–0.1)
Basophils Relative: 1 %
Eosinophils Absolute: 0.5 10*3/uL (ref 0.0–0.5)
Eosinophils Relative: 6 %
HCT: 34.5 % — ABNORMAL LOW (ref 39.0–52.0)
Hemoglobin: 10.8 g/dL — ABNORMAL LOW (ref 13.0–17.0)
Immature Granulocytes: 1 %
Lymphocytes Relative: 25 %
Lymphs Abs: 2.2 10*3/uL (ref 0.7–4.0)
MCH: 30.6 pg (ref 26.0–34.0)
MCHC: 31.3 g/dL (ref 30.0–36.0)
MCV: 97.7 fL (ref 80.0–100.0)
Monocytes Absolute: 0.8 10*3/uL (ref 0.1–1.0)
Monocytes Relative: 9 %
Neutro Abs: 5.1 10*3/uL (ref 1.7–7.7)
Neutrophils Relative %: 58 %
Platelet Count: 199 10*3/uL (ref 150–400)
RBC: 3.53 MIL/uL — ABNORMAL LOW (ref 4.22–5.81)
RDW: 14.9 % (ref 11.5–15.5)
WBC Count: 8.8 10*3/uL (ref 4.0–10.5)
nRBC: 0 % (ref 0.0–0.2)

## 2021-09-14 LAB — CMP (CANCER CENTER ONLY)
ALT: 18 U/L (ref 0–44)
AST: 15 U/L (ref 15–41)
Albumin: 3.7 g/dL (ref 3.5–5.0)
Alkaline Phosphatase: 55 U/L (ref 38–126)
Anion gap: 5 (ref 5–15)
BUN: 25 mg/dL — ABNORMAL HIGH (ref 8–23)
CO2: 30 mmol/L (ref 22–32)
Calcium: 9.4 mg/dL (ref 8.9–10.3)
Chloride: 106 mmol/L (ref 98–111)
Creatinine: 1.87 mg/dL — ABNORMAL HIGH (ref 0.61–1.24)
GFR, Estimated: 35 mL/min — ABNORMAL LOW (ref >60)
Glucose, Bld: 129 mg/dL — ABNORMAL HIGH (ref 70–99)
Potassium: 4.9 mmol/L (ref 3.5–5.1)
Sodium: 141 mmol/L (ref 135–145)
Total Bilirubin: 0.6 mg/dL (ref 0.3–1.2)
Total Protein: 6.1 g/dL — ABNORMAL LOW (ref 6.5–8.1)

## 2021-09-14 MED ORDER — PROCHLORPERAZINE MALEATE 10 MG PO TABS
10.0000 mg | ORAL_TABLET | Freq: Once | ORAL | Status: AC
  Administered 2021-09-14: 10:00:00 10 mg via ORAL
  Filled 2021-09-14: qty 1

## 2021-09-14 MED ORDER — BORTEZOMIB CHEMO SQ INJECTION 3.5 MG (2.5MG/ML)
1.3000 mg/m2 | Freq: Once | INTRAMUSCULAR | Status: AC
  Administered 2021-09-14: 10:00:00 2.25 mg via SUBCUTANEOUS
  Filled 2021-09-14: qty 0.9

## 2021-09-14 MED ORDER — DEXAMETHASONE 4 MG PO TABS
20.0000 mg | ORAL_TABLET | ORAL | Status: DC
  Administered 2021-09-14: 10:00:00 20 mg via ORAL
  Filled 2021-09-14: qty 5

## 2021-09-14 NOTE — Progress Notes (Signed)
Ok to treat with creatinine of 1.87 per Dr Marin Olp. dph

## 2021-09-14 NOTE — Patient Instructions (Signed)
[facility] Discharge Instructions for Patients Receiving Chemotherapy  Today you received the following chemotherapy agents Velcade  To help prevent nausea and vomiting after your treatment, we encourage you to take your nausea medication as prescribed by MD.   If you develop nausea and vomiting that is not controlled by your nausea medication, call the clinic.   BELOW ARE SYMPTOMS THAT SHOULD BE REPORTED IMMEDIATELY: *FEVER GREATER THAN 100.5 F *CHILLS WITH OR WITHOUT FEVER NAUSEA AND VOMITING THAT IS NOT CONTROLLED WITH YOUR NAUSEA MEDICATION *UNUSUAL SHORTNESS OF BREATH *UNUSUAL BRUISING OR BLEEDING TENDERNESS IN MOUTH AND THROAT WITH OR WITHOUT PRESENCE OF ULCERS *URINARY PROBLEMS *BOWEL PROBLEMS UNUSUAL RASH Items with * indicate a potential emergency and should be followed up as soon as possible.  Feel free to call the clinic should you have any questions or concerns. The clinic phone number is [phone].  Please show the [facility] at check-in to the Emergency Department and triage nurse.

## 2021-09-28 ENCOUNTER — Other Ambulatory Visit: Payer: Self-pay

## 2021-09-28 ENCOUNTER — Inpatient Hospital Stay: Payer: Medicare Other

## 2021-09-28 ENCOUNTER — Inpatient Hospital Stay (HOSPITAL_BASED_OUTPATIENT_CLINIC_OR_DEPARTMENT_OTHER): Payer: Medicare Other | Admitting: Hematology & Oncology

## 2021-09-28 ENCOUNTER — Inpatient Hospital Stay: Payer: Medicare Other | Attending: Family

## 2021-09-28 ENCOUNTER — Encounter: Payer: Self-pay | Admitting: Hematology & Oncology

## 2021-09-28 VITALS — BP 125/49 | HR 66 | Temp 98.5°F | Resp 18 | Wt 140.0 lb

## 2021-09-28 DIAGNOSIS — C9000 Multiple myeloma not having achieved remission: Secondary | ICD-10-CM | POA: Diagnosis present

## 2021-09-28 DIAGNOSIS — Z5111 Encounter for antineoplastic chemotherapy: Secondary | ICD-10-CM | POA: Diagnosis present

## 2021-09-28 LAB — CBC WITH DIFFERENTIAL (CANCER CENTER ONLY)
Abs Immature Granulocytes: 0.04 10*3/uL (ref 0.00–0.07)
Basophils Absolute: 0.1 10*3/uL (ref 0.0–0.1)
Basophils Relative: 1 %
Eosinophils Absolute: 0.3 10*3/uL (ref 0.0–0.5)
Eosinophils Relative: 3 %
HCT: 34.3 % — ABNORMAL LOW (ref 39.0–52.0)
Hemoglobin: 10.7 g/dL — ABNORMAL LOW (ref 13.0–17.0)
Immature Granulocytes: 1 %
Lymphocytes Relative: 20 %
Lymphs Abs: 1.7 10*3/uL (ref 0.7–4.0)
MCH: 30.7 pg (ref 26.0–34.0)
MCHC: 31.2 g/dL (ref 30.0–36.0)
MCV: 98.3 fL (ref 80.0–100.0)
Monocytes Absolute: 0.8 10*3/uL (ref 0.1–1.0)
Monocytes Relative: 10 %
Neutro Abs: 5.6 10*3/uL (ref 1.7–7.7)
Neutrophils Relative %: 65 %
Platelet Count: 209 10*3/uL (ref 150–400)
RBC: 3.49 MIL/uL — ABNORMAL LOW (ref 4.22–5.81)
RDW: 14.9 % (ref 11.5–15.5)
WBC Count: 8.4 10*3/uL (ref 4.0–10.5)
nRBC: 0 % (ref 0.0–0.2)

## 2021-09-28 LAB — CMP (CANCER CENTER ONLY)
ALT: 18 U/L (ref 0–44)
AST: 15 U/L (ref 15–41)
Albumin: 3.8 g/dL (ref 3.5–5.0)
Alkaline Phosphatase: 48 U/L (ref 38–126)
Anion gap: 8 (ref 5–15)
BUN: 28 mg/dL — ABNORMAL HIGH (ref 8–23)
CO2: 29 mmol/L (ref 22–32)
Calcium: 9.5 mg/dL (ref 8.9–10.3)
Chloride: 103 mmol/L (ref 98–111)
Creatinine: 1.81 mg/dL — ABNORMAL HIGH (ref 0.61–1.24)
GFR, Estimated: 36 mL/min — ABNORMAL LOW (ref >60)
Glucose, Bld: 134 mg/dL — ABNORMAL HIGH (ref 70–99)
Potassium: 4.1 mmol/L (ref 3.5–5.1)
Sodium: 140 mmol/L (ref 135–145)
Total Bilirubin: 0.6 mg/dL (ref 0.3–1.2)
Total Protein: 5.9 g/dL — ABNORMAL LOW (ref 6.5–8.1)

## 2021-09-28 LAB — LACTATE DEHYDROGENASE: LDH: 177 U/L (ref 98–192)

## 2021-09-28 MED ORDER — DEXAMETHASONE 4 MG PO TABS
20.0000 mg | ORAL_TABLET | ORAL | Status: DC
  Administered 2021-09-28: 20 mg via ORAL
  Filled 2021-09-28: qty 5

## 2021-09-28 MED ORDER — PROCHLORPERAZINE MALEATE 10 MG PO TABS
10.0000 mg | ORAL_TABLET | Freq: Once | ORAL | Status: AC
  Administered 2021-09-28: 10 mg via ORAL
  Filled 2021-09-28: qty 1

## 2021-09-28 MED ORDER — DENOSUMAB 120 MG/1.7ML ~~LOC~~ SOLN
120.0000 mg | Freq: Once | SUBCUTANEOUS | Status: AC
  Administered 2021-09-28: 120 mg via SUBCUTANEOUS
  Filled 2021-09-28: qty 1.7

## 2021-09-28 MED ORDER — BORTEZOMIB CHEMO SQ INJECTION 3.5 MG (2.5MG/ML)
1.3000 mg/m2 | Freq: Once | INTRAMUSCULAR | Status: AC
  Administered 2021-09-28: 2.25 mg via SUBCUTANEOUS
  Filled 2021-09-28: qty 0.9

## 2021-09-28 NOTE — Progress Notes (Signed)
Hematology and Oncology Follow Up Visit  Shawn Bauer [facility] [id number] 1933-12-26 88 y.o. 09/28/2021   Principle Diagnosis:  IgG Kappa myeloma -- high risk due to t(4:21)   Current Therapy:        Velcade/Decadron -- started 10/09/2019, s/p cycle #`22--treatment frequency changed to every other week on 01/21/2021 Xgeva 120 mg IM q 3 months -- next dose on 12/2021   Interim History: Shawn Bauer is here today for follow-up.  He survived the Christmas and New Year's holiday.  He is a 5 the cold weather.   He is doing pretty well.  He has had no problems with nausea or vomiting.   His myeloma studies have been holding pretty steady.  His monoclonal spike was found.  His IgG level was 708 mg/dL.  The Kappa light chain was 20 mg/dL.  He has had no change in bowel or bladder habits.  He has had no cough or shortness of breath.  He has had no bleeding.  He has had no rashes.  Overall, his performance status is ECOG 1.     Medications:  Allergies as of 09/28/2021   No Known Allergies      Medication List        Accurate as of September 28, 2021 10:12 AM. If you have any questions, ask your nurse or doctor.          amiodarone 200 MG tablet Commonly known as: PACERONE Take 2 tablets (400 mg total) by mouth 2 (two) times daily for 7 days, THEN 2 tablets (400 mg total) once daily for 7 days, THEN 1 tablet (200 mg total) once daily. Start taking on: June 02, 2021   aspirin EC 81 MG tablet Take 81 mg by mouth daily. Swallow whole.   atorvastatin 40 MG tablet Commonly known as: LIPITOR Take 1 tablet (40 mg total) by mouth daily.   CINNAMON PO Take 1,000 mg by mouth daily.   clopidogrel 75 MG tablet Commonly known as: PLAVIX Take 1 tablet (75 mg total) by mouth daily with breakfast.   cyanocobalamin 1000 MCG tablet Take 1,000 mcg by mouth daily.   metoprolol succinate 25 MG 24 hr tablet Commonly known as: TOPROL-XL Take 25 mg by mouth daily.   nitroGLYCERIN 0.4 MG SL  tablet Commonly known as: NITROSTAT Place 1 tablet (0.4 mg total) under the tongue every 5 (five) minutes as needed for chest pain.   olmesartan 20 MG tablet Commonly known as: BENICAR Take 20 mg by mouth in the morning.   Symbicort 160-4.5 MCG/ACT inhaler Generic drug: budesonide-formoterol Inhale 2 puffs into the lungs 2 (two) times daily.   traMADol 50 MG tablet Commonly known as: ULTRAM Take 50 mg by mouth 2 (two) times daily as needed (for pain).        Allergies: No Known Allergies  Past Medical History, Surgical history, Social history, and Family History were reviewed and updated.  Review of Systems: Review of Systems  Constitutional: Negative.   HENT: Negative.    Eyes: Negative.   Respiratory: Negative.    Cardiovascular:  Positive for palpitations.  Gastrointestinal: Negative.   Genitourinary: Negative.   Musculoskeletal: Negative.   Skin: Negative.   Neurological: Negative.   Endo/Heme/Allergies: Negative.   Psychiatric/Behavioral: Negative.      Physical Exam:  vitals were not taken for this visit.   Wt Readings from Last 3 Encounters:  08/31/21 138 lb (62.6 kg)  08/03/21 137 lb 8 oz (62.4 kg)  07/06/21 135 lb (61.2  kg)    Physical Exam Vitals reviewed.  HENT:     Head: Normocephalic and atraumatic.  Eyes:     Pupils: Pupils are equal, round, and reactive to light.  Cardiovascular:     Rate and Rhythm: Normal rate and regular rhythm.     Heart sounds: Normal heart sounds.  Pulmonary:     Effort: Pulmonary effort is normal.     Breath sounds: Normal breath sounds.  Abdominal:     General: Bowel sounds are normal.     Palpations: Abdomen is soft.  Musculoskeletal:        General: No tenderness or deformity. Normal range of motion.     Cervical back: Normal range of motion.  Lymphadenopathy:     Cervical: No cervical adenopathy.  Skin:    General: Skin is warm and dry.     Findings: No erythema or rash.  Neurological:     Mental  Status: He is alert and oriented to person, place, and time.  Psychiatric:        Behavior: Behavior normal.        Thought Content: Thought content normal.        Judgment: Judgment normal.     Lab Results  Component Value Date   WBC 8.4 09/28/2021   HGB 10.7 (L) 09/28/2021   HCT 34.3 (L) 09/28/2021   MCV 98.3 09/28/2021   PLT 209 09/28/2021   No results found for: FERRITIN, IRON, TIBC, UIBC, IRONPCTSAT Lab Results  Component Value Date   RBC 3.49 (L) 09/28/2021   Lab Results  Component Value Date   KPAFRELGTCHN 200.8 (H) 08/31/2021   LAMBDASER 12.0 08/31/2021   KAPLAMBRATIO 16.73 (H) 08/31/2021   Lab Results  Component Value Date   IGGSERUM 708 08/31/2021   IGA 142 08/31/2021   IGMSERUM 36 08/31/2021   Lab Results  Component Value Date   TOTALPROTELP 5.8 (L) 08/31/2021   ALBUMINELP 3.2 08/31/2021   A1GS 0.2 08/31/2021   A2GS 0.8 08/31/2021   BETS 0.8 08/31/2021   GAMS 0.7 08/31/2021   MSPIKE Not Observed 08/31/2021   SPEI Comment 04/03/2021     Chemistry      Component Value Date/Time   NA 140 09/28/2021 0904   K 4.1 09/28/2021 0904   CL 103 09/28/2021 0904   CO2 29 09/28/2021 0904   BUN 28 (H) 09/28/2021 0904   CREATININE 1.81 (H) 09/28/2021 0904      Component Value Date/Time   CALCIUM 9.5 09/28/2021 0904   ALKPHOS 48 09/28/2021 0904   AST 15 09/28/2021 0904   ALT 18 09/28/2021 0904   BILITOT 0.6 09/28/2021 0904       Impression and Plan: Shawn Bauer is a very pleasant 88 yo caucasian gentleman with IgG kappa myeloma.  So far, everything is going nicely with respect to the myeloma.  So far the myeloma has been holding pretty steady.  His hemoglobin is a little on the lower side.  However, this is not any lower.  We will go ahead with his treatment.  We will plan to see him back ourselves in another month.   [name], MD 1/5/202310:12 AM

## 2021-09-28 NOTE — Patient Instructions (Signed)
[facility] [place]  Discharge Instructions: Thank you for choosing [facility] to provide your oncology and hematology care.   If you have a lab appointment with the [facility], please go directly to the [facility] and check in at the registration area.  Wear comfortable clothing and clothing appropriate for easy access to any Portacath or PICC line.   We strive to give you quality time with your provider. You may need to reschedule your appointment if you arrive late (15 or more minutes).  Arriving late affects you and other patients whose appointments are after yours.  Also, if you miss three or more appointments without notifying the office, you may be dismissed from the clinic at the providers discretion.      For prescription refill requests, have your pharmacy contact our office and allow 72 hours for refills to be completed.    Today you received the following chemotherapy and/or immunotherapy agents xgeva, velcade      To help prevent nausea and vomiting after your treatment, we encourage you to take your nausea medication as directed.  BELOW ARE SYMPTOMS THAT SHOULD BE REPORTED IMMEDIATELY: *FEVER GREATER THAN 100.4 F (38 C) OR HIGHER *CHILLS OR SWEATING *NAUSEA AND VOMITING THAT IS NOT CONTROLLED WITH YOUR NAUSEA MEDICATION *UNUSUAL SHORTNESS OF BREATH *UNUSUAL BRUISING OR BLEEDING *URINARY PROBLEMS (pain or burning when urinating, or frequent urination) *BOWEL PROBLEMS (unusual diarrhea, constipation, pain near the anus) TENDERNESS IN MOUTH AND THROAT WITH OR WITHOUT PRESENCE OF ULCERS (sore throat, sores in mouth, or a toothache) UNUSUAL RASH, SWELLING OR PAIN  UNUSUAL VAGINAL DISCHARGE OR ITCHING   Items with * indicate a potential emergency and should be followed up as soon as possible or go to the Emergency Department if any problems should occur.  Please show the CHEMOTHERAPY ALERT CARD or IMMUNOTHERAPY ALERT CARD at check-in to  the Emergency Department and triage nurse. Should you have questions after your visit or need to cancel or reschedule your appointment, please contact [facility]  [phone] and follow the prompts.  Office hours are 8:00 a.m. to 4:30 p.m. Monday - Friday. Please note that voicemails left after 4:00 p.m. may not be returned until the following business day.  We are closed weekends and major holidays. You have access to a nurse at all times for urgent questions. Please call the main number to the clinic [phone] and follow the prompts.  For any non-urgent questions, you may also contact your provider using MyChart. We now offer e-Visits for anyone 77 and older to request care online for non-urgent symptoms. For details visit [email or web address].   Also download the MyChart app! Go to the app store, search "MyChart", open the app, select [facility], and log in with your MyChart username and password.  Due to Covid, a mask is required upon entering the hospital/clinic. If you do not have a mask, one will be given to you upon arrival. For doctor visits, patients may have 1 support person aged 51 or older with them. For treatment visits, patients cannot have anyone with them due to current Covid guidelines and our immunocompromised population.

## 2021-09-29 LAB — IGG, IGA, IGM
IgA: 132 mg/dL (ref 61–437)
IgG (Immunoglobin G), Serum: 672 mg/dL (ref 603–1613)
IgM (Immunoglobulin M), Srm: 33 mg/dL (ref 15–143)

## 2021-09-29 LAB — KAPPA/LAMBDA LIGHT CHAINS
Kappa free light chain: 247.6 mg/L — ABNORMAL HIGH (ref 3.3–19.4)
Kappa, lambda light chain ratio: 22.31 — ABNORMAL HIGH (ref 0.26–1.65)
Lambda free light chains: 11.1 mg/L (ref 5.7–26.3)

## 2021-09-29 LAB — ERYTHROPOIETIN: Erythropoietin: 28.1 m[IU]/mL — ABNORMAL HIGH (ref 2.6–18.5)

## 2021-10-02 LAB — PROTEIN ELECTROPHORESIS, SERUM, WITH REFLEX
A/G Ratio: 1.4 (ref 0.7–1.7)
Albumin ELP: 3.4 g/dL (ref 2.9–4.4)
Alpha-1-Globulin: 0.2 g/dL (ref 0.0–0.4)
Alpha-2-Globulin: 0.7 g/dL (ref 0.4–1.0)
Beta Globulin: 0.8 g/dL (ref 0.7–1.3)
Gamma Globulin: 0.6 g/dL (ref 0.4–1.8)
Globulin, Total: 2.4 g/dL (ref 2.2–3.9)
Total Protein ELP: 5.8 g/dL — ABNORMAL LOW (ref 6.0–8.5)

## 2021-10-11 ENCOUNTER — Other Ambulatory Visit: Payer: Self-pay | Admitting: Family

## 2021-10-11 DIAGNOSIS — C9000 Multiple myeloma not having achieved remission: Secondary | ICD-10-CM

## 2021-10-12 ENCOUNTER — Other Ambulatory Visit: Payer: Self-pay

## 2021-10-12 ENCOUNTER — Ambulatory Visit: Payer: Medicare Other

## 2021-10-12 ENCOUNTER — Inpatient Hospital Stay: Payer: Medicare Other

## 2021-10-12 VITALS — BP 119/44 | HR 70 | Temp 98.4°F | Resp 18

## 2021-10-12 DIAGNOSIS — C9000 Multiple myeloma not having achieved remission: Secondary | ICD-10-CM

## 2021-10-12 DIAGNOSIS — Z5111 Encounter for antineoplastic chemotherapy: Secondary | ICD-10-CM | POA: Diagnosis not present

## 2021-10-12 LAB — CMP (CANCER CENTER ONLY)
ALT: 18 U/L (ref 0–44)
AST: 15 U/L (ref 15–41)
Albumin: 3.7 g/dL (ref 3.5–5.0)
Alkaline Phosphatase: 52 U/L (ref 38–126)
Anion gap: 5 (ref 5–15)
BUN: 27 mg/dL — ABNORMAL HIGH (ref 8–23)
CO2: 29 mmol/L (ref 22–32)
Calcium: 9.1 mg/dL (ref 8.9–10.3)
Chloride: 106 mmol/L (ref 98–111)
Creatinine: 1.78 mg/dL — ABNORMAL HIGH (ref 0.61–1.24)
GFR, Estimated: 37 mL/min — ABNORMAL LOW (ref >60)
Glucose, Bld: 111 mg/dL — ABNORMAL HIGH (ref 70–99)
Potassium: 4.5 mmol/L (ref 3.5–5.1)
Sodium: 140 mmol/L (ref 135–145)
Total Bilirubin: 0.6 mg/dL (ref 0.3–1.2)
Total Protein: 5.9 g/dL — ABNORMAL LOW (ref 6.5–8.1)

## 2021-10-12 LAB — CBC WITH DIFFERENTIAL (CANCER CENTER ONLY)
Abs Immature Granulocytes: 0.03 10*3/uL (ref 0.00–0.07)
Basophils Absolute: 0.1 10*3/uL (ref 0.0–0.1)
Basophils Relative: 1 %
Eosinophils Absolute: 0.4 10*3/uL (ref 0.0–0.5)
Eosinophils Relative: 4 %
HCT: 34.2 % — ABNORMAL LOW (ref 39.0–52.0)
Hemoglobin: 10.8 g/dL — ABNORMAL LOW (ref 13.0–17.0)
Immature Granulocytes: 0 %
Lymphocytes Relative: 23 %
Lymphs Abs: 2.1 10*3/uL (ref 0.7–4.0)
MCH: 30.9 pg (ref 26.0–34.0)
MCHC: 31.6 g/dL (ref 30.0–36.0)
MCV: 97.7 fL (ref 80.0–100.0)
Monocytes Absolute: 0.9 10*3/uL (ref 0.1–1.0)
Monocytes Relative: 10 %
Neutro Abs: 5.8 10*3/uL (ref 1.7–7.7)
Neutrophils Relative %: 62 %
Platelet Count: 201 10*3/uL (ref 150–400)
RBC: 3.5 MIL/uL — ABNORMAL LOW (ref 4.22–5.81)
RDW: 14.6 % (ref 11.5–15.5)
WBC Count: 9.3 10*3/uL (ref 4.0–10.5)
nRBC: 0 % (ref 0.0–0.2)

## 2021-10-12 MED ORDER — DEXAMETHASONE 4 MG PO TABS
20.0000 mg | ORAL_TABLET | ORAL | Status: DC
  Administered 2021-10-12: 20 mg via ORAL
  Filled 2021-10-12: qty 5

## 2021-10-12 MED ORDER — PROCHLORPERAZINE MALEATE 10 MG PO TABS
10.0000 mg | ORAL_TABLET | Freq: Once | ORAL | Status: AC
  Administered 2021-10-12: 10 mg via ORAL
  Filled 2021-10-12: qty 1

## 2021-10-12 MED ORDER — BORTEZOMIB CHEMO SQ INJECTION 3.5 MG (2.5MG/ML)
1.3000 mg/m2 | Freq: Once | INTRAMUSCULAR | Status: AC
  Administered 2021-10-12: 2.25 mg via SUBCUTANEOUS
  Filled 2021-10-12: qty 0.9

## 2021-10-12 NOTE — Patient Instructions (Signed)
[facility] [place]  Discharge Instructions: Thank you for choosing [facility] to provide your oncology and hematology care.   If you have a lab appointment with the [facility], please go directly to the [facility] and check in at the registration area.  Wear comfortable clothing and clothing appropriate for easy access to any Portacath or PICC line.   We strive to give you quality time with your provider. You may need to reschedule your appointment if you arrive late (15 or more minutes).  Arriving late affects you and other patients whose appointments are after yours.  Also, if you miss three or more appointments without notifying the office, you may be dismissed from the clinic at the providers discretion.      For prescription refill requests, have your pharmacy contact our office and allow 72 hours for refills to be completed.    Today you received the following chemotherapy and/or immunotherapy agents velcade    To help prevent nausea and vomiting after your treatment, we encourage you to take your nausea medication as directed.  BELOW ARE SYMPTOMS THAT SHOULD BE REPORTED IMMEDIATELY: *FEVER GREATER THAN 100.4 F (38 C) OR HIGHER *CHILLS OR SWEATING *NAUSEA AND VOMITING THAT IS NOT CONTROLLED WITH YOUR NAUSEA MEDICATION *UNUSUAL SHORTNESS OF BREATH *UNUSUAL BRUISING OR BLEEDING *URINARY PROBLEMS (pain or burning when urinating, or frequent urination) *BOWEL PROBLEMS (unusual diarrhea, constipation, pain near the anus) TENDERNESS IN MOUTH AND THROAT WITH OR WITHOUT PRESENCE OF ULCERS (sore throat, sores in mouth, or a toothache) UNUSUAL RASH, SWELLING OR PAIN  UNUSUAL VAGINAL DISCHARGE OR ITCHING   Items with * indicate a potential emergency and should be followed up as soon as possible or go to the Emergency Department if any problems should occur.  Please show the CHEMOTHERAPY ALERT CARD or IMMUNOTHERAPY ALERT CARD at check-in to the  Emergency Department and triage nurse. Should you have questions after your visit or need to cancel or reschedule your appointment, please contact [facility]  [phone] and follow the prompts.  Office hours are 8:00 a.m. to 4:30 p.m. Monday - Friday. Please note that voicemails left after 4:00 p.m. may not be returned until the following business day.  We are closed weekends and major holidays. You have access to a nurse at all times for urgent questions. Please call the main number to the clinic [phone] and follow the prompts.  For any non-urgent questions, you may also contact your provider using MyChart. We now offer e-Visits for anyone 70 and older to request care online for non-urgent symptoms. For details visit [email or web address].   Also download the MyChart app! Go to the app store, search "MyChart", open the app, select , and log in with your MyChart username and password.  Due to Covid, a mask is required upon entering the hospital/clinic. If you do not have a mask, one will be given to you upon arrival. For doctor visits, patients may have 1 support person aged 5 or older with them. For treatment visits, patients cannot have anyone with them due to current Covid guidelines and our immunocompromised population.

## 2021-10-12 NOTE — Progress Notes (Signed)
Per Lottie Dawson NP, okay to treat today despite labs.

## 2021-10-26 ENCOUNTER — Encounter: Payer: Self-pay | Admitting: Hematology & Oncology

## 2021-10-26 ENCOUNTER — Inpatient Hospital Stay: Payer: Medicare Other

## 2021-10-26 ENCOUNTER — Other Ambulatory Visit: Payer: Self-pay

## 2021-10-26 ENCOUNTER — Inpatient Hospital Stay: Payer: Medicare Other | Attending: Family

## 2021-10-26 ENCOUNTER — Inpatient Hospital Stay (HOSPITAL_BASED_OUTPATIENT_CLINIC_OR_DEPARTMENT_OTHER): Payer: Medicare Other | Admitting: Hematology & Oncology

## 2021-10-26 VITALS — BP 142/76 | HR 70 | Resp 18

## 2021-10-26 VITALS — BP 151/56 | HR 72 | Temp 98.1°F | Resp 18 | Ht 71.0 in | Wt 145.0 lb

## 2021-10-26 DIAGNOSIS — C9000 Multiple myeloma not having achieved remission: Secondary | ICD-10-CM

## 2021-10-26 DIAGNOSIS — Z5111 Encounter for antineoplastic chemotherapy: Secondary | ICD-10-CM | POA: Diagnosis not present

## 2021-10-26 LAB — CMP (CANCER CENTER ONLY)
ALT: 18 U/L (ref 0–44)
AST: 16 U/L (ref 15–41)
Albumin: 4 g/dL (ref 3.5–5.0)
Alkaline Phosphatase: 62 U/L (ref 38–126)
Anion gap: 8 (ref 5–15)
BUN: 25 mg/dL — ABNORMAL HIGH (ref 8–23)
CO2: 30 mmol/L (ref 22–32)
Calcium: 9.7 mg/dL (ref 8.9–10.3)
Chloride: 104 mmol/L (ref 98–111)
Creatinine: 1.88 mg/dL — ABNORMAL HIGH (ref 0.61–1.24)
GFR, Estimated: 35 mL/min — ABNORMAL LOW (ref >60)
Glucose, Bld: 117 mg/dL — ABNORMAL HIGH (ref 70–99)
Potassium: 4.4 mmol/L (ref 3.5–5.1)
Sodium: 142 mmol/L (ref 135–145)
Total Bilirubin: 0.7 mg/dL (ref 0.3–1.2)
Total Protein: 6.2 g/dL — ABNORMAL LOW (ref 6.5–8.1)

## 2021-10-26 LAB — CBC WITH DIFFERENTIAL (CANCER CENTER ONLY)
Abs Immature Granulocytes: 0.05 10*3/uL (ref 0.00–0.07)
Basophils Absolute: 0.1 10*3/uL (ref 0.0–0.1)
Basophils Relative: 1 %
Eosinophils Absolute: 0.4 10*3/uL (ref 0.0–0.5)
Eosinophils Relative: 4 %
HCT: 35 % — ABNORMAL LOW (ref 39.0–52.0)
Hemoglobin: 11 g/dL — ABNORMAL LOW (ref 13.0–17.0)
Immature Granulocytes: 1 %
Lymphocytes Relative: 23 %
Lymphs Abs: 2 10*3/uL (ref 0.7–4.0)
MCH: 30.5 pg (ref 26.0–34.0)
MCHC: 31.4 g/dL (ref 30.0–36.0)
MCV: 97 fL (ref 80.0–100.0)
Monocytes Absolute: 1 10*3/uL (ref 0.1–1.0)
Monocytes Relative: 11 %
Neutro Abs: 5.5 10*3/uL (ref 1.7–7.7)
Neutrophils Relative %: 60 %
Platelet Count: 209 10*3/uL (ref 150–400)
RBC: 3.61 MIL/uL — ABNORMAL LOW (ref 4.22–5.81)
RDW: 14.4 % (ref 11.5–15.5)
WBC Count: 8.9 10*3/uL (ref 4.0–10.5)
nRBC: 0 % (ref 0.0–0.2)

## 2021-10-26 LAB — LACTATE DEHYDROGENASE: LDH: 191 U/L (ref 98–192)

## 2021-10-26 MED ORDER — PROCHLORPERAZINE MALEATE 10 MG PO TABS
10.0000 mg | ORAL_TABLET | Freq: Once | ORAL | Status: AC
  Administered 2021-10-26: 10 mg via ORAL
  Filled 2021-10-26: qty 1

## 2021-10-26 MED ORDER — DEXAMETHASONE 4 MG PO TABS
20.0000 mg | ORAL_TABLET | ORAL | Status: DC
  Administered 2021-10-26: 20 mg via ORAL
  Filled 2021-10-26: qty 5

## 2021-10-26 MED ORDER — BORTEZOMIB CHEMO SQ INJECTION 3.5 MG (2.5MG/ML)
1.3000 mg/m2 | Freq: Once | INTRAMUSCULAR | Status: AC
  Administered 2021-10-26: 2.25 mg via SUBCUTANEOUS
  Filled 2021-10-26: qty 0.9

## 2021-10-26 NOTE — Progress Notes (Signed)
Hematology and Oncology Follow Up Visit  Shawn Bauer [facility] [id number] 02-29-1934 88 y.o. 10/26/2021   Principle Diagnosis:  IgG Kappa myeloma -- high risk due to t(4:21)   Current Therapy:        Velcade/Decadron -- started 10/09/2019, s/p cycle #`23--treatment frequency changed to every other week on 01/21/2021 Xgeva 120 mg IM q 3 months -- next dose on 12/2021   Interim History: Shawn Bauer is here today for follow-up.  So far, he has been doing pretty well.  He scratched under his left eye today.  He has a good sized ecchymoses.  His actual eye is doing okay.  He has had no problems with fever.  There is no change in bowel or bladder habits.  He is little bit constipated.  When we last checked his monoclonal studies, there was no monoclonal spike in his blood.  His IgG level was 672 mg/dL.  The Kappa light chain was 24.8 mg/dL.  He has had no cough.  He has had no cardiac issues.  There is been no leg swelling.  Overall, his performance status is ECOG 1.       Medications:  Allergies as of 10/26/2021   No Known Allergies      Medication List        Accurate as of October 26, 2021  9:27 AM. If you have any questions, ask your nurse or doctor.          amiodarone 200 MG tablet Commonly known as: PACERONE Take 2 tablets (400 mg total) by mouth 2 (two) times daily for 7 days, THEN 2 tablets (400 mg total) once daily for 7 days, THEN 1 tablet (200 mg total) once daily. Start taking on: June 02, 2021   aspirin EC 81 MG tablet Take 81 mg by mouth daily. Swallow whole.   atorvastatin 40 MG tablet Commonly known as: LIPITOR Take 1 tablet (40 mg total) by mouth daily.   CINNAMON PO Take 1,000 mg by mouth daily.   clopidogrel 75 MG tablet Commonly known as: PLAVIX Take 1 tablet (75 mg total) by mouth daily with breakfast.   cyanocobalamin 1000 MCG tablet Take 1,000 mcg by mouth daily.   metoprolol succinate 25 MG 24 hr tablet Commonly known as: TOPROL-XL Take 25  mg by mouth daily.   nitroGLYCERIN 0.4 MG SL tablet Commonly known as: NITROSTAT Place 1 tablet (0.4 mg total) under the tongue every 5 (five) minutes as needed for chest pain.   olmesartan 20 MG tablet Commonly known as: BENICAR Take 20 mg by mouth in the morning.   Symbicort 160-4.5 MCG/ACT inhaler Generic drug: budesonide-formoterol Inhale 2 puffs into the lungs 2 (two) times daily.   traMADol 50 MG tablet Commonly known as: ULTRAM Take 50 mg by mouth 2 (two) times daily as needed (for pain).        Allergies: No Known Allergies  Past Medical History, Surgical history, Social history, and Family History were reviewed and updated.  Review of Systems: Review of Systems  Constitutional: Negative.   HENT: Negative.    Eyes: Negative.   Respiratory: Negative.    Cardiovascular:  Positive for palpitations.  Gastrointestinal: Negative.   Genitourinary: Negative.   Musculoskeletal: Negative.   Skin: Negative.   Neurological: Negative.   Endo/Heme/Allergies: Negative.   Psychiatric/Behavioral: Negative.      Physical Exam:  height is 5\' 11"  (1.803 m) and weight is 145 lb 0.6 oz (65.8 kg). His oral temperature is 98.1 F (36.7 C).  His blood pressure is 151/56 (abnormal) and his pulse is 72. His respiration is 18 and oxygen saturation is 100%.   Wt Readings from Last 3 Encounters:  10/26/21 145 lb 0.6 oz (65.8 kg)  09/28/21 140 lb (63.5 kg)  08/31/21 138 lb (62.6 kg)    Physical Exam Vitals reviewed.  HENT:     Head: Normocephalic and atraumatic.  Eyes:     Pupils: Pupils are equal, round, and reactive to light.  Cardiovascular:     Rate and Rhythm: Normal rate and regular rhythm.     Heart sounds: Normal heart sounds.  Pulmonary:     Effort: Pulmonary effort is normal.     Breath sounds: Normal breath sounds.  Abdominal:     General: Bowel sounds are normal.     Palpations: Abdomen is soft.  Musculoskeletal:        General: No tenderness or deformity.  Normal range of motion.     Cervical back: Normal range of motion.  Lymphadenopathy:     Cervical: No cervical adenopathy.  Skin:    General: Skin is warm and dry.     Findings: No erythema or rash.  Neurological:     Mental Status: He is alert and oriented to person, place, and time.  Psychiatric:        Behavior: Behavior normal.        Thought Content: Thought content normal.        Judgment: Judgment normal.     Lab Results  Component Value Date   WBC 8.9 10/26/2021   HGB 11.0 (L) 10/26/2021   HCT 35.0 (L) 10/26/2021   MCV 97.0 10/26/2021   PLT 209 10/26/2021   No results found for: FERRITIN, IRON, TIBC, UIBC, IRONPCTSAT Lab Results  Component Value Date   RBC 3.61 (L) 10/26/2021   Lab Results  Component Value Date   KPAFRELGTCHN 247.6 (H) 09/28/2021   LAMBDASER 11.1 09/28/2021   KAPLAMBRATIO 22.31 (H) 09/28/2021   Lab Results  Component Value Date   IGGSERUM 672 09/28/2021   IGA 132 09/28/2021   IGMSERUM 33 09/28/2021   Lab Results  Component Value Date   TOTALPROTELP 5.8 (L) 09/28/2021   ALBUMINELP 3.4 09/28/2021   A1GS 0.2 09/28/2021   A2GS 0.7 09/28/2021   BETS 0.8 09/28/2021   GAMS 0.6 09/28/2021   MSPIKE Not Observed 09/28/2021   SPEI Comment 04/03/2021     Chemistry      Component Value Date/Time   NA 140 10/12/2021 0917   K 4.5 10/12/2021 0917   CL 106 10/12/2021 0917   CO2 29 10/12/2021 0917   BUN 27 (H) 10/12/2021 0917   CREATININE 1.78 (H) 10/12/2021 0917      Component Value Date/Time   CALCIUM 9.1 10/12/2021 0917   ALKPHOS 52 10/12/2021 0917   AST 15 10/12/2021 0917   ALT 18 10/12/2021 0917   BILITOT 0.6 10/12/2021 0917       Impression and Plan: Shawn Bauer is a very pleasant 88 yo caucasian gentleman with IgG kappa myeloma.  So far, everything is going nicely with respect to the myeloma.  So far the myeloma has been holding pretty steady.  His hemoglobin is actually a little bit better.  I am happy about this part.  He is not  symptomatic with this.  We will go ahead with his 24th cycle of treatment.  I do not see that we have to make any adjustments to date.     Rudell Cobb  Marin Olp, MD 2/2/20239:27 AM

## 2021-10-26 NOTE — Progress Notes (Signed)
Ok to treat with Creatinine of 1.88 per Dr Marin Olp.

## 2021-10-26 NOTE — Patient Instructions (Signed)
[facility] [place]  Discharge Instructions: Thank you for choosing [facility] to provide your oncology and hematology care.   If you have a lab appointment with the [facility], please go directly to the [facility] and check in at the registration area.  Wear comfortable clothing and clothing appropriate for easy access to any Portacath or PICC line.   We strive to give you quality time with your provider. You may need to reschedule your appointment if you arrive late (15 or more minutes).  Arriving late affects you and other patients whose appointments are after yours.  Also, if you miss three or more appointments without notifying the office, you may be dismissed from the clinic at the providers discretion.      For prescription refill requests, have your pharmacy contact our office and allow 72 hours for refills to be completed.    Today you received the following chemotherapy and/or immunotherapy agents Velcade       To help prevent nausea and vomiting after your treatment, we encourage you to take your nausea medication as directed.  BELOW ARE SYMPTOMS THAT SHOULD BE REPORTED IMMEDIATELY: *FEVER GREATER THAN 100.4 F (38 C) OR HIGHER *CHILLS OR SWEATING *NAUSEA AND VOMITING THAT IS NOT CONTROLLED WITH YOUR NAUSEA MEDICATION *UNUSUAL SHORTNESS OF BREATH *UNUSUAL BRUISING OR BLEEDING *URINARY PROBLEMS (pain or burning when urinating, or frequent urination) *BOWEL PROBLEMS (unusual diarrhea, constipation, pain near the anus) TENDERNESS IN MOUTH AND THROAT WITH OR WITHOUT PRESENCE OF ULCERS (sore throat, sores in mouth, or a toothache) UNUSUAL RASH, SWELLING OR PAIN  UNUSUAL VAGINAL DISCHARGE OR ITCHING   Items with * indicate a potential emergency and should be followed up as soon as possible or go to the Emergency Department if any problems should occur.  Please show the CHEMOTHERAPY ALERT CARD or IMMUNOTHERAPY ALERT CARD at check-in to the  Emergency Department and triage nurse. Should you have questions after your visit or need to cancel or reschedule your appointment, please contact [facility]  [phone] and follow the prompts.  Office hours are 8:00 a.m. to 4:30 p.m. Monday - Friday. Please note that voicemails left after 4:00 p.m. may not be returned until the following business day.  We are closed weekends and major holidays. You have access to a nurse at all times for urgent questions. Please call the main number to the clinic [phone] and follow the prompts.  For any non-urgent questions, you may also contact your provider using MyChart. We now offer e-Visits for anyone 53 and older to request care online for non-urgent symptoms. For details visit [email or web address].   Also download the MyChart app! Go to the app store, search "MyChart", open the app, select [facility], and log in with your MyChart username and password.  Due to Covid, a mask is required upon entering the hospital/clinic. If you do not have a mask, one will be given to you upon arrival. For doctor visits, patients may have 1 support person aged 21 or older with them. For treatment visits, patients cannot have anyone with them due to current Covid guidelines and our immunocompromised population.

## 2021-10-27 LAB — IGG, IGA, IGM
IgA: 137 mg/dL (ref 61–437)
IgG (Immunoglobin G), Serum: 710 mg/dL (ref 603–1613)
IgM (Immunoglobulin M), Srm: 32 mg/dL (ref 15–143)

## 2021-10-27 LAB — KAPPA/LAMBDA LIGHT CHAINS
Kappa free light chain: 237.1 mg/L — ABNORMAL HIGH (ref 3.3–19.4)
Kappa, lambda light chain ratio: 23.25 — ABNORMAL HIGH (ref 0.26–1.65)
Lambda free light chains: 10.2 mg/L (ref 5.7–26.3)

## 2021-10-30 LAB — PROTEIN ELECTROPHORESIS, SERUM, WITH REFLEX
A/G Ratio: 1.1 (ref 0.7–1.7)
Albumin ELP: 3.3 g/dL (ref 2.9–4.4)
Alpha-1-Globulin: 0.3 g/dL (ref 0.0–0.4)
Alpha-2-Globulin: 0.9 g/dL (ref 0.4–1.0)
Beta Globulin: 0.9 g/dL (ref 0.7–1.3)
Gamma Globulin: 0.8 g/dL (ref 0.4–1.8)
Globulin, Total: 2.9 g/dL (ref 2.2–3.9)
Total Protein ELP: 6.2 g/dL (ref 6.0–8.5)

## 2021-11-09 ENCOUNTER — Inpatient Hospital Stay: Payer: Medicare Other

## 2021-11-09 ENCOUNTER — Other Ambulatory Visit: Payer: Self-pay

## 2021-11-09 ENCOUNTER — Other Ambulatory Visit: Payer: Self-pay | Admitting: *Deleted

## 2021-11-09 VITALS — BP 148/56 | HR 69 | Temp 97.8°F | Resp 18 | Ht 71.0 in | Wt 146.0 lb

## 2021-11-09 DIAGNOSIS — C9000 Multiple myeloma not having achieved remission: Secondary | ICD-10-CM

## 2021-11-09 DIAGNOSIS — R778 Other specified abnormalities of plasma proteins: Secondary | ICD-10-CM

## 2021-11-09 DIAGNOSIS — Z5111 Encounter for antineoplastic chemotherapy: Secondary | ICD-10-CM | POA: Diagnosis not present

## 2021-11-09 LAB — CMP (CANCER CENTER ONLY)
ALT: 17 U/L (ref 0–44)
AST: 16 U/L (ref 15–41)
Albumin: 3.5 g/dL (ref 3.5–5.0)
Alkaline Phosphatase: 57 U/L (ref 38–126)
Anion gap: 5 (ref 5–15)
BUN: 24 mg/dL — ABNORMAL HIGH (ref 8–23)
CO2: 29 mmol/L (ref 22–32)
Calcium: 8.8 mg/dL — ABNORMAL LOW (ref 8.9–10.3)
Chloride: 106 mmol/L (ref 98–111)
Creatinine: 1.63 mg/dL — ABNORMAL HIGH (ref 0.61–1.24)
GFR, Estimated: 41 mL/min — ABNORMAL LOW (ref >60)
Glucose, Bld: 126 mg/dL — ABNORMAL HIGH (ref 70–99)
Potassium: 5.2 mmol/L — ABNORMAL HIGH (ref 3.5–5.1)
Sodium: 140 mmol/L (ref 135–145)
Total Bilirubin: 0.7 mg/dL (ref 0.3–1.2)
Total Protein: 5.9 g/dL — ABNORMAL LOW (ref 6.5–8.1)

## 2021-11-09 LAB — CBC WITH DIFFERENTIAL (CANCER CENTER ONLY)
Abs Immature Granulocytes: 0.06 10*3/uL (ref 0.00–0.07)
Basophils Absolute: 0.1 10*3/uL (ref 0.0–0.1)
Basophils Relative: 1 %
Eosinophils Absolute: 0.4 10*3/uL (ref 0.0–0.5)
Eosinophils Relative: 4 %
HCT: 35 % — ABNORMAL LOW (ref 39.0–52.0)
Hemoglobin: 11 g/dL — ABNORMAL LOW (ref 13.0–17.0)
Immature Granulocytes: 1 %
Lymphocytes Relative: 24 %
Lymphs Abs: 2.2 10*3/uL (ref 0.7–4.0)
MCH: 30.4 pg (ref 26.0–34.0)
MCHC: 31.4 g/dL (ref 30.0–36.0)
MCV: 96.7 fL (ref 80.0–100.0)
Monocytes Absolute: 1 10*3/uL (ref 0.1–1.0)
Monocytes Relative: 11 %
Neutro Abs: 5.7 10*3/uL (ref 1.7–7.7)
Neutrophils Relative %: 59 %
Platelet Count: 213 10*3/uL (ref 150–400)
RBC: 3.62 MIL/uL — ABNORMAL LOW (ref 4.22–5.81)
RDW: 14.8 % (ref 11.5–15.5)
WBC Count: 9.5 10*3/uL (ref 4.0–10.5)
nRBC: 0 % (ref 0.0–0.2)

## 2021-11-09 MED ORDER — BORTEZOMIB CHEMO SQ INJECTION 3.5 MG (2.5MG/ML)
1.3000 mg/m2 | Freq: Once | INTRAMUSCULAR | Status: AC
  Administered 2021-11-09: 2.25 mg via SUBCUTANEOUS
  Filled 2021-11-09: qty 0.9

## 2021-11-09 MED ORDER — PROCHLORPERAZINE MALEATE 10 MG PO TABS
10.0000 mg | ORAL_TABLET | Freq: Once | ORAL | Status: AC
  Administered 2021-11-09: 10 mg via ORAL
  Filled 2021-11-09: qty 1

## 2021-11-09 MED ORDER — DEXAMETHASONE 4 MG PO TABS
20.0000 mg | ORAL_TABLET | ORAL | Status: DC
  Administered 2021-11-09: 20 mg via ORAL
  Filled 2021-11-09: qty 5

## 2021-11-09 NOTE — Progress Notes (Signed)
Reviewed pt labs with Dr. Marin Olp and pt ok to treat with creatinine 1.63

## 2021-11-09 NOTE — Patient Instructions (Signed)
[facility] Discharge Instructions for Patients Receiving Chemotherapy  Today you received the following chemotherapy agents Velcade  To help prevent nausea and vomiting after your treatment, we encourage you to take your nausea medication as prescribed by MD.   If you develop nausea and vomiting that is not controlled by your nausea medication, call the clinic.   BELOW ARE SYMPTOMS THAT SHOULD BE REPORTED IMMEDIATELY: *FEVER GREATER THAN 100.5 F *CHILLS WITH OR WITHOUT FEVER NAUSEA AND VOMITING THAT IS NOT CONTROLLED WITH YOUR NAUSEA MEDICATION *UNUSUAL SHORTNESS OF BREATH *UNUSUAL BRUISING OR BLEEDING TENDERNESS IN MOUTH AND THROAT WITH OR WITHOUT PRESENCE OF ULCERS *URINARY PROBLEMS *BOWEL PROBLEMS UNUSUAL RASH Items with * indicate a potential emergency and should be followed up as soon as possible.  Feel free to call the clinic should you have any questions or concerns. The clinic phone number is [phone].  Please show the [facility] at check-in to the Emergency Department and triage nurse.

## 2021-11-23 ENCOUNTER — Inpatient Hospital Stay: Payer: Medicare Other | Attending: Family

## 2021-11-23 ENCOUNTER — Inpatient Hospital Stay: Payer: Medicare Other

## 2021-11-23 ENCOUNTER — Ambulatory Visit: Payer: Medicare Other

## 2021-11-23 ENCOUNTER — Telehealth: Payer: Self-pay | Admitting: *Deleted

## 2021-11-23 ENCOUNTER — Encounter: Payer: Self-pay | Admitting: Hematology & Oncology

## 2021-11-23 ENCOUNTER — Other Ambulatory Visit: Payer: Self-pay

## 2021-11-23 ENCOUNTER — Inpatient Hospital Stay (HOSPITAL_BASED_OUTPATIENT_CLINIC_OR_DEPARTMENT_OTHER): Payer: Medicare Other | Admitting: Hematology & Oncology

## 2021-11-23 VITALS — BP 132/58 | HR 77 | Temp 98.1°F | Resp 18 | Wt 144.0 lb

## 2021-11-23 DIAGNOSIS — C9000 Multiple myeloma not having achieved remission: Secondary | ICD-10-CM

## 2021-11-23 DIAGNOSIS — Z5111 Encounter for antineoplastic chemotherapy: Secondary | ICD-10-CM | POA: Diagnosis not present

## 2021-11-23 LAB — CBC WITH DIFFERENTIAL (CANCER CENTER ONLY)
Abs Immature Granulocytes: 0.04 10*3/uL (ref 0.00–0.07)
Basophils Absolute: 0.1 10*3/uL (ref 0.0–0.1)
Basophils Relative: 1 %
Eosinophils Absolute: 0.4 10*3/uL (ref 0.0–0.5)
Eosinophils Relative: 5 %
HCT: 33.8 % — ABNORMAL LOW (ref 39.0–52.0)
Hemoglobin: 10.5 g/dL — ABNORMAL LOW (ref 13.0–17.0)
Immature Granulocytes: 1 %
Lymphocytes Relative: 23 %
Lymphs Abs: 2 10*3/uL (ref 0.7–4.0)
MCH: 29.9 pg (ref 26.0–34.0)
MCHC: 31.1 g/dL (ref 30.0–36.0)
MCV: 96.3 fL (ref 80.0–100.0)
Monocytes Absolute: 0.8 10*3/uL (ref 0.1–1.0)
Monocytes Relative: 9 %
Neutro Abs: 5.4 10*3/uL (ref 1.7–7.7)
Neutrophils Relative %: 61 %
Platelet Count: 213 10*3/uL (ref 150–400)
RBC: 3.51 MIL/uL — ABNORMAL LOW (ref 4.22–5.81)
RDW: 15.2 % (ref 11.5–15.5)
WBC Count: 8.7 10*3/uL (ref 4.0–10.5)
nRBC: 0 % (ref 0.0–0.2)

## 2021-11-23 LAB — CMP (CANCER CENTER ONLY)
ALT: 18 U/L (ref 0–44)
AST: 16 U/L (ref 15–41)
Albumin: 3.4 g/dL — ABNORMAL LOW (ref 3.5–5.0)
Alkaline Phosphatase: 61 U/L (ref 38–126)
Anion gap: 7 (ref 5–15)
BUN: 26 mg/dL — ABNORMAL HIGH (ref 8–23)
CO2: 28 mmol/L (ref 22–32)
Calcium: 8.3 mg/dL — ABNORMAL LOW (ref 8.9–10.3)
Chloride: 107 mmol/L (ref 98–111)
Creatinine: 1.68 mg/dL — ABNORMAL HIGH (ref 0.61–1.24)
GFR, Estimated: 40 mL/min — ABNORMAL LOW (ref >60)
Glucose, Bld: 129 mg/dL — ABNORMAL HIGH (ref 70–99)
Potassium: 4.1 mmol/L (ref 3.5–5.1)
Sodium: 142 mmol/L (ref 135–145)
Total Bilirubin: 0.6 mg/dL (ref 0.3–1.2)
Total Protein: 5.8 g/dL — ABNORMAL LOW (ref 6.5–8.1)

## 2021-11-23 LAB — LACTATE DEHYDROGENASE: LDH: 174 U/L (ref 98–192)

## 2021-11-23 MED ORDER — PROCHLORPERAZINE MALEATE 10 MG PO TABS
10.0000 mg | ORAL_TABLET | Freq: Once | ORAL | Status: AC
  Administered 2021-11-23: 10 mg via ORAL
  Filled 2021-11-23: qty 1

## 2021-11-23 MED ORDER — DEXAMETHASONE 4 MG PO TABS
20.0000 mg | ORAL_TABLET | ORAL | Status: DC
  Administered 2021-11-23: 20 mg via ORAL
  Filled 2021-11-23: qty 5

## 2021-11-23 MED ORDER — BORTEZOMIB CHEMO SQ INJECTION 3.5 MG (2.5MG/ML)
1.3000 mg/m2 | Freq: Once | INTRAMUSCULAR | Status: AC
  Administered 2021-11-23: 2.25 mg via SUBCUTANEOUS
  Filled 2021-11-23: qty 0.9

## 2021-11-23 NOTE — Progress Notes (Signed)
?Hematology and Oncology Follow Up Visit ? ?Shawn Bauer ?[id number] ?1934/08/04 88 y.o. ?11/23/2021 ? ? ?Principle Diagnosis:  ?IgG Kappa myeloma -- high risk due to t(4:21) ?  ?Current Therapy:        ?Velcade/Decadron -- started 10/09/2019, s/p cycle #`24--treatment frequency changed to every other week on 01/21/2021 ?Xgeva 120 mg IM q 3 months -- next dose on 12/2021 ?  ?Interim History: Shawn Bauer is here today for follow-up.  Everything is going pretty well so far.  He has had no problems with the treatment.  He has had no problems with nausea or vomiting.  He may have a little bit of constipation. ? ?His myeloma seems to be holding pretty steady.  His last myeloma studies back in February did not show any obvious monoclonal spike in his blood.  His IgG level was 710 mg/dL.  The Kappa light chain was 24 mg/dL. ? ?He has had no bony pain.  He has had no problems with cough or shortness of breath.  There is no bleeding.  He has had no rashes.  He has had no leg swelling. ? ?Overall, his performance status is ECOG 1.   ? ?Medications:  ?Allergies as of 11/23/2021   ?No Known Allergies ?  ? ?  ?Medication List  ?  ? ?  ? Accurate as of November 23, 2021  1:21 PM. If you have any questions, ask your nurse or doctor.  ?  ?  ? ?  ? ?amiodarone 200 MG tablet ?Commonly known as: PACERONE ?Take 2 tablets (400 mg total) by mouth 2 (two) times daily for 7 days, THEN 2 tablets (400 mg total) once daily for 7 days, THEN 1 tablet (200 mg total) once daily. ?Start taking on: June 02, 2021 ?  ?aspirin EC 81 MG tablet ?Take 81 mg by mouth daily. Swallow whole. ?  ?atorvastatin 40 MG tablet ?Commonly known as: LIPITOR ?Take 1 tablet (40 mg total) by mouth daily. ?  ?CINNAMON PO ?Take 1,000 mg by mouth daily. ?  ?clopidogrel 75 MG tablet ?Commonly known as: PLAVIX ?Take 75 mg by mouth daily. ?  ?clopidogrel 75 MG tablet ?Commonly known as: PLAVIX ?Take 1 tablet (75 mg total) by mouth daily with breakfast. ?  ?cyanocobalamin 1000 MCG  tablet ?Take 1,000 mcg by mouth daily. ?  ?metoprolol succinate 25 MG 24 hr tablet ?Commonly known as: TOPROL-XL ?Take 25 mg by mouth daily. ?  ?nitroGLYCERIN 0.4 MG SL tablet ?Commonly known as: NITROSTAT ?Place 1 tablet (0.4 mg total) under the tongue every 5 (five) minutes as needed for chest pain. ?  ?olmesartan 20 MG tablet ?Commonly known as: BENICAR ?Take 20 mg by mouth in the morning. ?  ?Symbicort 160-4.5 MCG/ACT inhaler ?Generic drug: budesonide-formoterol ?Inhale 2 puffs into the lungs 2 (two) times daily. ?  ?traMADol 50 MG tablet ?Commonly known as: ULTRAM ?Take 50 mg by mouth 2 (two) times daily as needed (for pain). ?  ? ?  ? ? ?Allergies: No Known Allergies ? ?Past Medical History, Surgical history, Social history, and Family History were reviewed and updated. ? ?Review of Systems: ?Review of Systems  ?Constitutional: Negative.   ?HENT: Negative.    ?Eyes: Negative.   ?Respiratory: Negative.    ?Cardiovascular:  Positive for palpitations.  ?Gastrointestinal: Negative.   ?Genitourinary: Negative.   ?Musculoskeletal: Negative.   ?Skin: Negative.   ?Neurological: Negative.   ?Endo/Heme/Allergies: Negative.   ?Psychiatric/Behavioral: Negative.    ? ? ?Physical Exam: ? weight  is 144 lb (65.3 kg). His oral temperature is 98.1 ?F (36.7 ?C). His blood pressure is 132/58 (abnormal) and his pulse is 77. His respiration is 18 and oxygen saturation is 97%.  ? ?Wt Readings from Last 3 Encounters:  ?11/23/21 144 lb (65.3 kg)  ?11/09/21 146 lb (66.2 kg)  ?10/26/21 145 lb 0.6 oz (65.8 kg)  ? ? ?Physical Exam ?Vitals reviewed.  ?HENT:  ?   Head: Normocephalic and atraumatic.  ?Eyes:  ?   Pupils: Pupils are equal, round, and reactive to light.  ?Cardiovascular:  ?   Rate and Rhythm: Normal rate and regular rhythm.  ?   Heart sounds: Normal heart sounds.  ?Pulmonary:  ?   Effort: Pulmonary effort is normal.  ?   Breath sounds: Normal breath sounds.  ?Abdominal:  ?   General: Bowel sounds are normal.  ?   Palpations:  Abdomen is soft.  ?Musculoskeletal:     ?   General: No tenderness or deformity. Normal range of motion.  ?   Cervical back: Normal range of motion.  ?Lymphadenopathy:  ?   Cervical: No cervical adenopathy.  ?Skin: ?   General: Skin is warm and dry.  ?   Findings: No erythema or rash.  ?Neurological:  ?   Mental Status: He is alert and oriented to person, place, and time.  ?Psychiatric:     ?   Behavior: Behavior normal.     ?   Thought Content: Thought content normal.     ?   Judgment: Judgment normal.  ? ? ? ?Lab Results  ?Component Value Date  ? WBC 8.7 11/23/2021  ? HGB 10.5 (L) 11/23/2021  ? HCT 33.8 (L) 11/23/2021  ? MCV 96.3 11/23/2021  ? PLT 213 11/23/2021  ? ?No results found for: FERRITIN, IRON, TIBC, UIBC, IRONPCTSAT ?Lab Results  ?Component Value Date  ? RBC 3.51 (L) 11/23/2021  ? ?Lab Results  ?Component Value Date  ? KPAFRELGTCHN 237.1 (H) 10/26/2021  ? LAMBDASER 10.2 10/26/2021  ? KAPLAMBRATIO 23.25 (H) 10/26/2021  ? ?Lab Results  ?Component Value Date  ? IGGSERUM 710 10/26/2021  ? IGA 137 10/26/2021  ? IGMSERUM 32 10/26/2021  ? ?Lab Results  ?Component Value Date  ? TOTALPROTELP 6.2 10/26/2021  ? ALBUMINELP 3.3 10/26/2021  ? A1GS 0.3 10/26/2021  ? A2GS 0.9 10/26/2021  ? BETS 0.9 10/26/2021  ? GAMS 0.8 10/26/2021  ? MSPIKE Not Observed 10/26/2021  ? SPEI Comment 04/03/2021  ? ?  Chemistry   ?   ?Component Value Date/Time  ? NA 140 11/09/2021 0947  ? K 5.2 (H) 11/09/2021 0947  ? CL 106 11/09/2021 0947  ? CO2 29 11/09/2021 0947  ? BUN 24 (H) 11/09/2021 0947  ? CREATININE 1.63 (H) 11/09/2021 0947  ?    ?Component Value Date/Time  ? CALCIUM 8.8 (L) 11/09/2021 0947  ? ALKPHOS 57 11/09/2021 0947  ? AST 16 11/09/2021 0947  ? ALT 17 11/09/2021 0947  ? BILITOT 0.7 11/09/2021 0947  ?  ? ? ? ?Impression and Plan: Shawn Bauer is a very pleasant 88 yo caucasian gentleman with IgG kappa myeloma.  So far, everything is going nicely with respect to the myeloma.  So far the myeloma has been holding pretty  steady. ? ?His hemoglobin has been a little on the low side.  His erythropoietin level back in January was 28.  I suppose we could always consider using ESA but he is really not symptomatic with this. ? ?We will  continue to treat him every 2 weeks. ? ?I will see him back in 1 month.   ? ? ?[name], MD ?3/2/20231:21 PM ?

## 2021-11-23 NOTE — Telephone Encounter (Signed)
Per 11/23/21 - gave upcoming appointments - confirmed ?

## 2021-11-23 NOTE — Patient Instructions (Signed)
[facility] ?Discharge Instructions for Patients Receiving Chemotherapy ? ?Today you received the following chemotherapy agents Velcade ? ?To help prevent nausea and vomiting after your treatment, we encourage you to take your nausea medication as prescribed by MD. ?  ?If you develop nausea and vomiting that is not controlled by your nausea medication, call the clinic.  ? ?BELOW ARE SYMPTOMS THAT SHOULD BE REPORTED IMMEDIATELY: ?*FEVER GREATER THAN 100.5 F ?*CHILLS WITH OR WITHOUT FEVER ?NAUSEA AND VOMITING THAT IS NOT CONTROLLED WITH YOUR NAUSEA MEDICATION ?*UNUSUAL SHORTNESS OF BREATH ?*UNUSUAL BRUISING OR BLEEDING ?TENDERNESS IN MOUTH AND THROAT WITH OR WITHOUT PRESENCE OF ULCERS ?*URINARY PROBLEMS ?*BOWEL PROBLEMS ?UNUSUAL RASH ?Items with * indicate a potential emergency and should be followed up as soon as possible. ? ?Feel free to call the clinic should you have any questions or concerns. The clinic phone number is [phone]. ? ?Please show the [facility] at check-in to the Emergency Department and triage nurse. ? ? ?

## 2021-11-23 NOTE — Progress Notes (Signed)
Ok to treat with creatinine of 1.68 per Dr Marin Olp. dph ?

## 2021-11-24 LAB — KAPPA/LAMBDA LIGHT CHAINS
Kappa free light chain: 222 mg/L — ABNORMAL HIGH (ref 3.3–19.4)
Kappa, lambda light chain ratio: 21.76 — ABNORMAL HIGH (ref 0.26–1.65)
Lambda free light chains: 10.2 mg/L (ref 5.7–26.3)

## 2021-11-24 LAB — IGG, IGA, IGM
IgA: 121 mg/dL (ref 61–437)
IgG (Immunoglobin G), Serum: 616 mg/dL (ref 603–1613)
IgM (Immunoglobulin M), Srm: 26 mg/dL (ref 15–143)

## 2021-11-27 LAB — PROTEIN ELECTROPHORESIS, SERUM, WITH REFLEX
A/G Ratio: 1.4 (ref 0.7–1.7)
Albumin ELP: 3.3 g/dL (ref 2.9–4.4)
Alpha-1-Globulin: 0.2 g/dL (ref 0.0–0.4)
Alpha-2-Globulin: 0.7 g/dL (ref 0.4–1.0)
Beta Globulin: 0.8 g/dL (ref 0.7–1.3)
Gamma Globulin: 0.5 g/dL (ref 0.4–1.8)
Globulin, Total: 2.3 g/dL (ref 2.2–3.9)
Total Protein ELP: 5.6 g/dL — ABNORMAL LOW (ref 6.0–8.5)

## 2021-12-06 ENCOUNTER — Other Ambulatory Visit: Payer: Self-pay | Admitting: *Deleted

## 2021-12-06 DIAGNOSIS — R778 Other specified abnormalities of plasma proteins: Secondary | ICD-10-CM

## 2021-12-06 DIAGNOSIS — C9000 Multiple myeloma not having achieved remission: Secondary | ICD-10-CM

## 2021-12-07 ENCOUNTER — Other Ambulatory Visit: Payer: Self-pay

## 2021-12-07 ENCOUNTER — Inpatient Hospital Stay: Payer: Medicare Other

## 2021-12-07 VITALS — BP 144/54 | HR 87 | Temp 98.4°F | Resp 18 | Ht 69.0 in | Wt 141.2 lb

## 2021-12-07 DIAGNOSIS — R778 Other specified abnormalities of plasma proteins: Secondary | ICD-10-CM

## 2021-12-07 DIAGNOSIS — Z5111 Encounter for antineoplastic chemotherapy: Secondary | ICD-10-CM | POA: Diagnosis not present

## 2021-12-07 DIAGNOSIS — C9000 Multiple myeloma not having achieved remission: Secondary | ICD-10-CM

## 2021-12-07 LAB — CBC WITH DIFFERENTIAL (CANCER CENTER ONLY)
Abs Immature Granulocytes: 0.08 10*3/uL — ABNORMAL HIGH (ref 0.00–0.07)
Basophils Absolute: 0.1 10*3/uL (ref 0.0–0.1)
Basophils Relative: 1 %
Eosinophils Absolute: 0.3 10*3/uL (ref 0.0–0.5)
Eosinophils Relative: 3 %
HCT: 33.3 % — ABNORMAL LOW (ref 39.0–52.0)
Hemoglobin: 10.4 g/dL — ABNORMAL LOW (ref 13.0–17.0)
Immature Granulocytes: 1 %
Lymphocytes Relative: 14 %
Lymphs Abs: 1.4 10*3/uL (ref 0.7–4.0)
MCH: 29.9 pg (ref 26.0–34.0)
MCHC: 31.2 g/dL (ref 30.0–36.0)
MCV: 95.7 fL (ref 80.0–100.0)
Monocytes Absolute: 1 10*3/uL (ref 0.1–1.0)
Monocytes Relative: 10 %
Neutro Abs: 7.1 10*3/uL (ref 1.7–7.7)
Neutrophils Relative %: 71 %
Platelet Count: 208 10*3/uL (ref 150–400)
RBC: 3.48 MIL/uL — ABNORMAL LOW (ref 4.22–5.81)
RDW: 15.5 % (ref 11.5–15.5)
WBC Count: 9.9 10*3/uL (ref 4.0–10.5)
nRBC: 0 % (ref 0.0–0.2)

## 2021-12-07 LAB — CMP (CANCER CENTER ONLY)
ALT: 15 U/L (ref 0–44)
AST: 14 U/L — ABNORMAL LOW (ref 15–41)
Albumin: 3.7 g/dL (ref 3.5–5.0)
Alkaline Phosphatase: 54 U/L (ref 38–126)
Anion gap: 7 (ref 5–15)
BUN: 25 mg/dL — ABNORMAL HIGH (ref 8–23)
CO2: 28 mmol/L (ref 22–32)
Calcium: 9.3 mg/dL (ref 8.9–10.3)
Chloride: 107 mmol/L (ref 98–111)
Creatinine: 1.74 mg/dL — ABNORMAL HIGH (ref 0.61–1.24)
GFR, Estimated: 38 mL/min — ABNORMAL LOW (ref >60)
Glucose, Bld: 146 mg/dL — ABNORMAL HIGH (ref 70–99)
Potassium: 4.3 mmol/L (ref 3.5–5.1)
Sodium: 142 mmol/L (ref 135–145)
Total Bilirubin: 0.6 mg/dL (ref 0.3–1.2)
Total Protein: 6.2 g/dL — ABNORMAL LOW (ref 6.5–8.1)

## 2021-12-07 MED ORDER — BORTEZOMIB CHEMO SQ INJECTION 3.5 MG (2.5MG/ML)
1.3000 mg/m2 | Freq: Once | INTRAMUSCULAR | Status: AC
  Administered 2021-12-07: 2.25 mg via SUBCUTANEOUS
  Filled 2021-12-07: qty 0.9

## 2021-12-07 MED ORDER — DEXAMETHASONE 4 MG PO TABS
20.0000 mg | ORAL_TABLET | ORAL | Status: DC
  Administered 2021-12-07: 20 mg via ORAL
  Filled 2021-12-07: qty 5

## 2021-12-07 MED ORDER — PROCHLORPERAZINE MALEATE 10 MG PO TABS
10.0000 mg | ORAL_TABLET | Freq: Once | ORAL | Status: AC
  Administered 2021-12-07: 10 mg via ORAL
  Filled 2021-12-07: qty 1

## 2021-12-07 NOTE — Progress Notes (Signed)
Ok to treat with creatinine 1.74 per Dr. Marin Olp ?

## 2021-12-07 NOTE — Patient Instructions (Signed)
[facility]  Discharge Instructions: ?Thank you for choosing [facility] to provide your oncology and hematology care.  ? ?If you have a lab appointment with the [facility], please go directly to the [facility] and check in at the registration area. ? ?Wear comfortable clothing and clothing appropriate for easy access to any Portacath or PICC line.  ? ?We strive to give you quality time with your provider. You may need to reschedule your appointment if you arrive late (15 or more minutes).  Arriving late affects you and other patients whose appointments are after yours.  Also, if you miss three or more appointments without notifying the office, you may be dismissed from the clinic at the provider?s discretion.    ?  ?For prescription refill requests, have your pharmacy contact our office and allow 72 hours for refills to be completed.   ? ?Today you received the following chemotherapy and/or immunotherapy agents Velcade  ?  ?To help prevent nausea and vomiting after your treatment, we encourage you to take your nausea medication as directed. ? ?BELOW ARE SYMPTOMS THAT SHOULD BE REPORTED IMMEDIATELY: ?*FEVER GREATER THAN 100.4 F (38 ?C) OR HIGHER ?*CHILLS OR SWEATING ?*NAUSEA AND VOMITING THAT IS NOT CONTROLLED WITH YOUR NAUSEA MEDICATION ?*UNUSUAL SHORTNESS OF BREATH ?*UNUSUAL BRUISING OR BLEEDING ?*URINARY PROBLEMS (pain or burning when urinating, or frequent urination) ?*BOWEL PROBLEMS (unusual diarrhea, constipation, pain near the anus) ?TENDERNESS IN MOUTH AND THROAT WITH OR WITHOUT PRESENCE OF ULCERS (sore throat, sores in mouth, or a toothache) ?UNUSUAL RASH, SWELLING OR PAIN  ?UNUSUAL VAGINAL DISCHARGE OR ITCHING  ? ?Items with * indicate a potential emergency and should be followed up as soon as possible or go to the Emergency Department if any problems should occur. ? ?Please show the CHEMOTHERAPY ALERT CARD or IMMUNOTHERAPY ALERT CARD at check-in to the  Emergency Department and triage nurse. ?Should you have questions after your visit or need to cancel or reschedule your appointment, please contact [facility]  [phone] and follow the prompts.  Office hours are 8:00 a.m. to 4:30 p.m. Monday - Friday. Please note that voicemails left after 4:00 p.m. may not be returned until the following business day.  We are closed weekends and major holidays. You have access to a nurse at all times for urgent questions. Please call the main number to the clinic [phone] and follow the prompts. ? ?For any non-urgent questions, you may also contact your provider using MyChart. We now offer e-Visits for anyone 34 and older to request care online for non-urgent symptoms. For details visit [email or web address]. ?  ?Also download the MyChart app! Go to the app store, search "MyChart", open the app, select [facility], and log in with your MyChart username and password. ? ?Due to Covid, a mask is required upon entering the hospital/clinic. If you do not have a mask, one will be given to you upon arrival. For doctor visits, patients may have 1 support person aged 66 or older with them. For treatment visits, patients cannot have anyone with them due to current Covid guidelines and our immunocompromised population.  ?

## 2021-12-20 ENCOUNTER — Other Ambulatory Visit: Payer: Self-pay | Admitting: Cardiology

## 2021-12-21 ENCOUNTER — Inpatient Hospital Stay: Payer: Medicare Other

## 2021-12-21 ENCOUNTER — Inpatient Hospital Stay (HOSPITAL_BASED_OUTPATIENT_CLINIC_OR_DEPARTMENT_OTHER): Payer: Medicare Other | Admitting: Hematology & Oncology

## 2021-12-21 ENCOUNTER — Encounter: Payer: Self-pay | Admitting: Hematology & Oncology

## 2021-12-21 ENCOUNTER — Other Ambulatory Visit: Payer: Self-pay

## 2021-12-21 ENCOUNTER — Telehealth: Payer: Self-pay | Admitting: *Deleted

## 2021-12-21 VITALS — BP 146/61 | HR 74 | Temp 98.1°F | Resp 18 | Ht 71.0 in | Wt 142.0 lb

## 2021-12-21 DIAGNOSIS — Z5111 Encounter for antineoplastic chemotherapy: Secondary | ICD-10-CM | POA: Diagnosis not present

## 2021-12-21 DIAGNOSIS — C9000 Multiple myeloma not having achieved remission: Secondary | ICD-10-CM | POA: Diagnosis not present

## 2021-12-21 LAB — CBC WITH DIFFERENTIAL (CANCER CENTER ONLY)
Abs Immature Granulocytes: 0.08 10*3/uL — ABNORMAL HIGH (ref 0.00–0.07)
Basophils Absolute: 0.1 10*3/uL (ref 0.0–0.1)
Basophils Relative: 1 %
Eosinophils Absolute: 0.4 10*3/uL (ref 0.0–0.5)
Eosinophils Relative: 4 %
HCT: 34.6 % — ABNORMAL LOW (ref 39.0–52.0)
Hemoglobin: 10.7 g/dL — ABNORMAL LOW (ref 13.0–17.0)
Immature Granulocytes: 1 %
Lymphocytes Relative: 21 %
Lymphs Abs: 2.1 10*3/uL (ref 0.7–4.0)
MCH: 29.3 pg (ref 26.0–34.0)
MCHC: 30.9 g/dL (ref 30.0–36.0)
MCV: 94.8 fL (ref 80.0–100.0)
Monocytes Absolute: 1.1 10*3/uL — ABNORMAL HIGH (ref 0.1–1.0)
Monocytes Relative: 11 %
Neutro Abs: 6.6 10*3/uL (ref 1.7–7.7)
Neutrophils Relative %: 62 %
Platelet Count: 253 10*3/uL (ref 150–400)
RBC: 3.65 MIL/uL — ABNORMAL LOW (ref 4.22–5.81)
RDW: 15.4 % (ref 11.5–15.5)
WBC Count: 10.3 10*3/uL (ref 4.0–10.5)
nRBC: 0 % (ref 0.0–0.2)

## 2021-12-21 LAB — RETICULOCYTES
Immature Retic Fract: 20 % — ABNORMAL HIGH (ref 2.3–15.9)
RBC.: 3.65 MIL/uL — ABNORMAL LOW (ref 4.22–5.81)
Retic Count, Absolute: 74.5 10*3/uL (ref 19.0–186.0)
Retic Ct Pct: 2 % (ref 0.4–3.1)

## 2021-12-21 LAB — CMP (CANCER CENTER ONLY)
ALT: 14 U/L (ref 0–44)
AST: 14 U/L — ABNORMAL LOW (ref 15–41)
Albumin: 3.8 g/dL (ref 3.5–5.0)
Alkaline Phosphatase: 57 U/L (ref 38–126)
Anion gap: 8 (ref 5–15)
BUN: 20 mg/dL (ref 8–23)
CO2: 30 mmol/L (ref 22–32)
Calcium: 9.5 mg/dL (ref 8.9–10.3)
Chloride: 104 mmol/L (ref 98–111)
Creatinine: 1.71 mg/dL — ABNORMAL HIGH (ref 0.61–1.24)
GFR, Estimated: 39 mL/min — ABNORMAL LOW (ref >60)
Glucose, Bld: 126 mg/dL — ABNORMAL HIGH (ref 70–99)
Potassium: 4.4 mmol/L (ref 3.5–5.1)
Sodium: 142 mmol/L (ref 135–145)
Total Bilirubin: 0.6 mg/dL (ref 0.3–1.2)
Total Protein: 6.2 g/dL — ABNORMAL LOW (ref 6.5–8.1)

## 2021-12-21 LAB — LACTATE DEHYDROGENASE: LDH: 182 U/L (ref 98–192)

## 2021-12-21 MED ORDER — BORTEZOMIB CHEMO SQ INJECTION 3.5 MG (2.5MG/ML)
1.3000 mg/m2 | Freq: Once | INTRAMUSCULAR | Status: AC
  Administered 2021-12-21: 2.25 mg via SUBCUTANEOUS
  Filled 2021-12-21: qty 0.9

## 2021-12-21 MED ORDER — DEXAMETHASONE 4 MG PO TABS
20.0000 mg | ORAL_TABLET | ORAL | Status: DC
  Administered 2021-12-21: 20 mg via ORAL

## 2021-12-21 MED ORDER — PROCHLORPERAZINE MALEATE 10 MG PO TABS
10.0000 mg | ORAL_TABLET | Freq: Once | ORAL | Status: AC
  Administered 2021-12-21: 10 mg via ORAL

## 2021-12-21 NOTE — Patient Instructions (Signed)
[facility] ?Discharge Instructions for Patients Receiving Chemotherapy ? ?Today you received the following chemotherapy agents Velcade ? ?To help prevent nausea and vomiting after your treatment, we encourage you to take your nausea medication as prescribed by MD. ?  ?If you develop nausea and vomiting that is not controlled by your nausea medication, call the clinic.  ? ?BELOW ARE SYMPTOMS THAT SHOULD BE REPORTED IMMEDIATELY: ?*FEVER GREATER THAN 100.5 F ?*CHILLS WITH OR WITHOUT FEVER ?NAUSEA AND VOMITING THAT IS NOT CONTROLLED WITH YOUR NAUSEA MEDICATION ?*UNUSUAL SHORTNESS OF BREATH ?*UNUSUAL BRUISING OR BLEEDING ?TENDERNESS IN MOUTH AND THROAT WITH OR WITHOUT PRESENCE OF ULCERS ?*URINARY PROBLEMS ?*BOWEL PROBLEMS ?UNUSUAL RASH ?Items with * indicate a potential emergency and should be followed up as soon as possible. ? ?Feel free to call the clinic should you have any questions or concerns. The clinic phone number is [phone]. ? ?Please show the [facility] at check-in to the Emergency Department and triage nurse. ? ? ?

## 2021-12-21 NOTE — Progress Notes (Signed)
Reviewed pt labs with Dr. Marin Olp and pt ok to treat with creatinine 1.7 ?

## 2021-12-21 NOTE — Telephone Encounter (Signed)
Per 12/21/21 los - gave upcoming appointments - confirmed ?

## 2021-12-21 NOTE — Progress Notes (Signed)
?Hematology and Oncology Follow Up Visit ? ?Shawn Bauer ?[id number] ?Oct 05, 1933 88 y.o. ?12/21/2021 ? ? ?Principle Diagnosis:  ?IgG Kappa myeloma -- high risk due to t(4:21) ?  ?Current Therapy:        ?Velcade/Decadron -- started 10/09/2019, s/p cycle #`25--treatment frequency changed to every other week on 01/21/2021 ?Xgeva 120 mg IM q 3 months -- next dose on 12/2021 ?  ?Interim History: Shawn Bauer is here today for follow-up.  Everything is going pretty well so far.  He has had no problems with the treatment.  He has had no problems with nausea or vomiting.  He may have a little bit of constipation. ? ?His myeloma seems to be holding pretty steady.  His last myeloma studies earlier in March showed no monoclonal spike.  His IgG level was 660 mg/dL.  The Kappa light chain was 22.2 mg/dL. ? ?He is probably going to mow his yard this weekend.  I think he has quite a bit of acreage. ? ?He has had no bleeding.  There has been some issues with unsteadiness.  There is no tingling in the hands or feet. ? ?Currently, his performance status is ECOG 1. ? ?  ? ?Medications:  ?Allergies as of 12/21/2021   ?No Known Allergies ?  ? ?  ?Medication List  ?  ? ?  ? Accurate as of December 21, 2021 10:42 AM. If you have any questions, ask your nurse or doctor.  ?  ?  ? ?  ? ?amiodarone 200 MG tablet ?Commonly known as: PACERONE ?Take 1 tablet (200 mg total) by mouth daily. Please schedule appt for future refills. 1st attempt ?  ?aspirin EC 81 MG tablet ?Take 81 mg by mouth daily. Swallow whole. ?  ?atorvastatin 40 MG tablet ?Commonly known as: LIPITOR ?Take 1 tablet (40 mg total) by mouth daily. ?  ?CINNAMON PO ?Take 1,000 mg by mouth daily. ?  ?clopidogrel 75 MG tablet ?Commonly known as: PLAVIX ?Take 1 tablet (75 mg total) by mouth daily with breakfast. ?  ?cyanocobalamin 1000 MCG tablet ?Take 1,000 mcg by mouth daily. ?  ?metoprolol succinate 25 MG 24 hr tablet ?Commonly known as: TOPROL-XL ?Take 25 mg by mouth daily. ?   ?nitroGLYCERIN 0.4 MG SL tablet ?Commonly known as: NITROSTAT ?Place 1 tablet (0.4 mg total) under the tongue every 5 (five) minutes as needed for chest pain. ?  ?olmesartan 20 MG tablet ?Commonly known as: BENICAR ?Take 20 mg by mouth in the morning. ?  ?Symbicort 160-4.5 MCG/ACT inhaler ?Generic drug: budesonide-formoterol ?Inhale 2 puffs into the lungs 2 (two) times daily. ?  ?traMADol 50 MG tablet ?Commonly known as: ULTRAM ?Take 50 mg by mouth 2 (two) times daily as needed (for pain). ?  ? ?  ? ? ?Allergies: No Known Allergies ? ?Past Medical History, Surgical history, Social history, and Family History were reviewed and updated. ? ?Review of Systems: ?Review of Systems  ?Constitutional: Negative.   ?HENT: Negative.    ?Eyes: Negative.   ?Respiratory: Negative.    ?Cardiovascular:  Positive for palpitations.  ?Gastrointestinal: Negative.   ?Genitourinary: Negative.   ?Musculoskeletal: Negative.   ?Skin: Negative.   ?Neurological: Negative.   ?Endo/Heme/Allergies: Negative.   ?Psychiatric/Behavioral: Negative.    ? ? ?Physical Exam: ? height is '5\' 11"'$  (1.803 m) and weight is 142 lb (64.4 kg). His oral temperature is 98.1 ?F (36.7 ?C). His blood pressure is 146/61 (abnormal) and his pulse is 74. His respiration is 18 and oxygen  saturation is 100%.  ? ?Wt Readings from Last 3 Encounters:  ?12/21/21 142 lb (64.4 kg)  ?12/07/21 141 lb 4 oz (64.1 kg)  ?11/23/21 144 lb (65.3 kg)  ? ? ?Physical Exam ?Vitals reviewed.  ?HENT:  ?   Head: Normocephalic and atraumatic.  ?Eyes:  ?   Pupils: Pupils are equal, round, and reactive to light.  ?Cardiovascular:  ?   Rate and Rhythm: Normal rate and regular rhythm.  ?   Heart sounds: Normal heart sounds.  ?Pulmonary:  ?   Effort: Pulmonary effort is normal.  ?   Breath sounds: Normal breath sounds.  ?Abdominal:  ?   General: Bowel sounds are normal.  ?   Palpations: Abdomen is soft.  ?Musculoskeletal:     ?   General: No tenderness or deformity. Normal range of motion.  ?    Cervical back: Normal range of motion.  ?Lymphadenopathy:  ?   Cervical: No cervical adenopathy.  ?Skin: ?   General: Skin is warm and dry.  ?   Findings: No erythema or rash.  ?Neurological:  ?   Mental Status: He is alert and oriented to person, place, and time.  ?Psychiatric:     ?   Behavior: Behavior normal.     ?   Thought Content: Thought content normal.     ?   Judgment: Judgment normal.  ? ? ? ?Lab Results  ?Component Value Date  ? WBC 10.3 12/21/2021  ? HGB 10.7 (L) 12/21/2021  ? HCT 34.6 (L) 12/21/2021  ? MCV 94.8 12/21/2021  ? PLT 253 12/21/2021  ? ?No results found for: FERRITIN, IRON, TIBC, UIBC, IRONPCTSAT ?Lab Results  ?Component Value Date  ? RETICCTPCT 2.0 12/21/2021  ? RBC 3.65 (L) 12/21/2021  ? ?Lab Results  ?Component Value Date  ? KPAFRELGTCHN 222.0 (H) 11/23/2021  ? LAMBDASER 10.2 11/23/2021  ? KAPLAMBRATIO 21.76 (H) 11/23/2021  ? ?Lab Results  ?Component Value Date  ? IGGSERUM 616 11/23/2021  ? IGA 121 11/23/2021  ? IGMSERUM 26 11/23/2021  ? ?Lab Results  ?Component Value Date  ? TOTALPROTELP 5.6 (L) 11/23/2021  ? ALBUMINELP 3.3 11/23/2021  ? A1GS 0.2 11/23/2021  ? A2GS 0.7 11/23/2021  ? BETS 0.8 11/23/2021  ? GAMS 0.5 11/23/2021  ? MSPIKE Not Observed 11/23/2021  ? SPEI Comment 04/03/2021  ? ?  Chemistry   ?   ?Component Value Date/Time  ? NA 142 12/21/2021 0923  ? K 4.4 12/21/2021 0923  ? CL 104 12/21/2021 0923  ? CO2 30 12/21/2021 0923  ? BUN 20 12/21/2021 0923  ? CREATININE 1.71 (H) 12/21/2021 5374  ?    ?Component Value Date/Time  ? CALCIUM 9.5 12/21/2021 0923  ? ALKPHOS 57 12/21/2021 0923  ? AST 14 (L) 12/21/2021 8270  ? ALT 14 12/21/2021 0923  ? BILITOT 0.6 12/21/2021 7867  ?  ? ? ? ?Impression and Plan: Shawn Bauer is a very pleasant 88 yo caucasian gentleman with IgG kappa myeloma.  So far, everything is going nicely with respect to the myeloma.  So far the myeloma has been holding pretty steady. ? ?His hemoglobin has been a little on the low side.  However, it is holding  steady. ? ?His erythropoietin level back in January was 28.  I suppose we could always consider using ESA but he is really not symptomatic with this. ? ?We will continue to treat him every 2 weeks. ? ?I will see him back in 1 month.   ? ? ?  [name], MD ?3/30/202310:42 AM ?

## 2021-12-22 LAB — KAPPA/LAMBDA LIGHT CHAINS
Kappa free light chain: 197.7 mg/L — ABNORMAL HIGH (ref 3.3–19.4)
Kappa, lambda light chain ratio: 18.83 — ABNORMAL HIGH (ref 0.26–1.65)
Lambda free light chains: 10.5 mg/L (ref 5.7–26.3)

## 2021-12-22 LAB — IGG, IGA, IGM
IgA: 130 mg/dL (ref 61–437)
IgG (Immunoglobin G), Serum: 688 mg/dL (ref 603–1613)
IgM (Immunoglobulin M), Srm: 33 mg/dL (ref 15–143)

## 2021-12-25 ENCOUNTER — Other Ambulatory Visit: Payer: Self-pay | Admitting: Cardiology

## 2021-12-25 LAB — PROTEIN ELECTROPHORESIS, SERUM, WITH REFLEX
A/G Ratio: 1.2 (ref 0.7–1.7)
Albumin ELP: 3.2 g/dL (ref 2.9–4.4)
Alpha-1-Globulin: 0.3 g/dL (ref 0.0–0.4)
Alpha-2-Globulin: 0.8 g/dL (ref 0.4–1.0)
Beta Globulin: 0.9 g/dL (ref 0.7–1.3)
Gamma Globulin: 0.7 g/dL (ref 0.4–1.8)
Globulin, Total: 2.6 g/dL (ref 2.2–3.9)
Total Protein ELP: 5.8 g/dL — ABNORMAL LOW (ref 6.0–8.5)

## 2021-12-26 ENCOUNTER — Encounter: Payer: Self-pay | Admitting: *Deleted

## 2022-01-04 ENCOUNTER — Inpatient Hospital Stay: Payer: Medicare Other

## 2022-01-04 ENCOUNTER — Inpatient Hospital Stay: Payer: Medicare Other | Attending: Family

## 2022-01-04 ENCOUNTER — Ambulatory Visit: Payer: Medicare Other

## 2022-01-04 VITALS — BP 135/52 | HR 65 | Temp 98.7°F

## 2022-01-04 DIAGNOSIS — C9000 Multiple myeloma not having achieved remission: Secondary | ICD-10-CM

## 2022-01-04 DIAGNOSIS — Z5111 Encounter for antineoplastic chemotherapy: Secondary | ICD-10-CM | POA: Diagnosis not present

## 2022-01-04 LAB — CMP (CANCER CENTER ONLY)
ALT: 15 U/L (ref 0–44)
AST: 16 U/L (ref 15–41)
Albumin: 3.8 g/dL (ref 3.5–5.0)
Alkaline Phosphatase: 58 U/L (ref 38–126)
Anion gap: 7 (ref 5–15)
BUN: 23 mg/dL (ref 8–23)
CO2: 30 mmol/L (ref 22–32)
Calcium: 9.7 mg/dL (ref 8.9–10.3)
Chloride: 103 mmol/L (ref 98–111)
Creatinine: 1.74 mg/dL — ABNORMAL HIGH (ref 0.61–1.24)
GFR, Estimated: 38 mL/min — ABNORMAL LOW (ref >60)
Glucose, Bld: 111 mg/dL — ABNORMAL HIGH (ref 70–99)
Potassium: 4.2 mmol/L (ref 3.5–5.1)
Sodium: 140 mmol/L (ref 135–145)
Total Bilirubin: 0.7 mg/dL (ref 0.3–1.2)
Total Protein: 6.2 g/dL — ABNORMAL LOW (ref 6.5–8.1)

## 2022-01-04 LAB — CBC WITH DIFFERENTIAL (CANCER CENTER ONLY)
Abs Immature Granulocytes: 0.07 10*3/uL (ref 0.00–0.07)
Basophils Absolute: 0.1 10*3/uL (ref 0.0–0.1)
Basophils Relative: 1 %
Eosinophils Absolute: 0.4 10*3/uL (ref 0.0–0.5)
Eosinophils Relative: 4 %
HCT: 35.3 % — ABNORMAL LOW (ref 39.0–52.0)
Hemoglobin: 11 g/dL — ABNORMAL LOW (ref 13.0–17.0)
Immature Granulocytes: 1 %
Lymphocytes Relative: 17 %
Lymphs Abs: 1.8 10*3/uL (ref 0.7–4.0)
MCH: 29 pg (ref 26.0–34.0)
MCHC: 31.2 g/dL (ref 30.0–36.0)
MCV: 93.1 fL (ref 80.0–100.0)
Monocytes Absolute: 1.2 10*3/uL — ABNORMAL HIGH (ref 0.1–1.0)
Monocytes Relative: 11 %
Neutro Abs: 7.1 10*3/uL (ref 1.7–7.7)
Neutrophils Relative %: 66 %
Platelet Count: 228 10*3/uL (ref 150–400)
RBC: 3.79 MIL/uL — ABNORMAL LOW (ref 4.22–5.81)
RDW: 15.7 % — ABNORMAL HIGH (ref 11.5–15.5)
WBC Count: 10.6 10*3/uL — ABNORMAL HIGH (ref 4.0–10.5)
nRBC: 0 % (ref 0.0–0.2)

## 2022-01-04 LAB — LACTATE DEHYDROGENASE: LDH: 182 U/L (ref 98–192)

## 2022-01-04 MED ORDER — PROCHLORPERAZINE MALEATE 10 MG PO TABS
10.0000 mg | ORAL_TABLET | Freq: Once | ORAL | Status: AC
  Administered 2022-01-04: 10 mg via ORAL
  Filled 2022-01-04: qty 1

## 2022-01-04 MED ORDER — DENOSUMAB 120 MG/1.7ML ~~LOC~~ SOLN
120.0000 mg | Freq: Once | SUBCUTANEOUS | Status: AC
  Administered 2022-01-04: 120 mg via SUBCUTANEOUS
  Filled 2022-01-04: qty 1.7

## 2022-01-04 MED ORDER — DEXAMETHASONE 4 MG PO TABS
20.0000 mg | ORAL_TABLET | ORAL | Status: DC
  Administered 2022-01-04: 20 mg via ORAL
  Filled 2022-01-04: qty 5

## 2022-01-04 MED ORDER — BORTEZOMIB CHEMO SQ INJECTION 3.5 MG (2.5MG/ML)
1.3000 mg/m2 | Freq: Once | INTRAMUSCULAR | Status: AC
  Administered 2022-01-04: 2.25 mg via SUBCUTANEOUS
  Filled 2022-01-04: qty 0.9

## 2022-01-04 NOTE — Patient Instructions (Signed)
[facility]  Discharge Instructions: ?Thank you for choosing [facility] to provide your oncology and hematology care.  ? ?If you have a lab appointment with the [facility], please go directly to the [facility] and check in at the registration area. ? ?Wear comfortable clothing and clothing appropriate for easy access to any Portacath or PICC line.  ? ?We strive to give you quality time with your provider. You may need to reschedule your appointment if you arrive late (15 or more minutes).  Arriving late affects you and other patients whose appointments are after yours.  Also, if you miss three or more appointments without notifying the office, you may be dismissed from the clinic at the provider?s discretion.    ?  ?For prescription refill requests, have your pharmacy contact our office and allow 72 hours for refills to be completed.   ? ?Today you received the following chemotherapy and/or immunotherapy agents [facility].    ?  ?To help prevent nausea and vomiting after your treatment, we encourage you to take your nausea medication as directed. ? ?BELOW ARE SYMPTOMS THAT SHOULD BE REPORTED IMMEDIATELY: ?*FEVER GREATER THAN 100.4 F (38 ?C) OR HIGHER ?*CHILLS OR SWEATING ?*NAUSEA AND VOMITING THAT IS NOT CONTROLLED WITH YOUR NAUSEA MEDICATION ?*UNUSUAL SHORTNESS OF BREATH ?*UNUSUAL BRUISING OR BLEEDING ?*URINARY PROBLEMS (pain or burning when urinating, or frequent urination) ?*BOWEL PROBLEMS (unusual diarrhea, constipation, pain near the anus) ?TENDERNESS IN MOUTH AND THROAT WITH OR WITHOUT PRESENCE OF ULCERS (sore throat, sores in mouth, or a toothache) ?UNUSUAL RASH, SWELLING OR PAIN  ?UNUSUAL VAGINAL DISCHARGE OR ITCHING  ? ?Items with * indicate a potential emergency and should be followed up as soon as possible or go to the Emergency Department if any problems should occur. ? ?Please show the CHEMOTHERAPY ALERT CARD or IMMUNOTHERAPY ALERT CARD at check-in to the  Emergency Department and triage nurse. ?Should you have questions after your visit or need to cancel or reschedule your appointment, please contact [facility]  [phone] and follow the prompts.  Office hours are 8:00 a.m. to 4:30 p.m. Monday - Friday. Please note that voicemails left after 4:00 p.m. may not be returned until the following business day.  We are closed weekends and major holidays. You have access to a nurse at all times for urgent questions. Please call the main number to the clinic [phone] and follow the prompts. ? ?For any non-urgent questions, you may also contact your provider using MyChart. We now offer e-Visits for anyone 51 and older to request care online for non-urgent symptoms. For details visit [email or web address]. ?  ?Also download the MyChart app! Go to the app store, search "MyChart", open the app, select [facility], and log in with your MyChart username and password. ? ?Due to Covid, a mask is required upon entering the hospital/clinic. If you do not have a mask, one will be given to you upon arrival. For doctor visits, patients may have 1 support person aged 73 or older with them. For treatment visits, patients cannot have anyone with them due to current Covid guidelines and our immunocompromised population.  ? ? ?

## 2022-01-05 LAB — PROTEIN ELECTROPHORESIS, SERUM, WITH REFLEX
A/G Ratio: 1 (ref 0.7–1.7)
Albumin ELP: 2.9 g/dL (ref 2.9–4.4)
Alpha-1-Globulin: 0.3 g/dL (ref 0.0–0.4)
Alpha-2-Globulin: 0.9 g/dL (ref 0.4–1.0)
Beta Globulin: 0.9 g/dL (ref 0.7–1.3)
Gamma Globulin: 0.7 g/dL (ref 0.4–1.8)
Globulin, Total: 2.8 g/dL (ref 2.2–3.9)
Total Protein ELP: 5.7 g/dL — ABNORMAL LOW (ref 6.0–8.5)

## 2022-01-05 LAB — KAPPA/LAMBDA LIGHT CHAINS
Kappa free light chain: 191.8 mg/L — ABNORMAL HIGH (ref 3.3–19.4)
Kappa, lambda light chain ratio: 18.8 — ABNORMAL HIGH (ref 0.26–1.65)
Lambda free light chains: 10.2 mg/L (ref 5.7–26.3)

## 2022-01-05 LAB — IGG, IGA, IGM
IgA: 131 mg/dL (ref 61–437)
IgG (Immunoglobin G), Serum: 696 mg/dL (ref 603–1613)
IgM (Immunoglobulin M), Srm: 27 mg/dL (ref 15–143)

## 2022-01-12 NOTE — Progress Notes (Signed)
? ?PCP:  [name], MD ?Primary Cardiologist: Sherren Mocha, MD ?Electrophysiologist: Cristopher Peru, MD  ? ?Shawn Bauer is a 88 y.o. male seen today for Cristopher Peru, MD for routine electrophysiology followup.  Since last being seen in our clinic the patient reports doing well overall. Has chronic intermittent palpitations, overall well tolerated. he denies chest pain, , dyspnea, PND, orthopnea, nausea, vomiting, dizziness, syncope, edema, weight gain, or early satiety. ? ?Past Medical History:  ?Diagnosis Date  ? Asthma   ? Atrial fibrillation ([place])   ? CAD (coronary artery disease)   ? s/p CABG  ? Degenerative disk disease   ? cervical/spinal stenosis  ? Goals of care, counseling/discussion 09/30/2019  ? HTN (hypertension)   ? Hyperlipidemia   ? Kappa light chain myeloma ([place]) 09/30/2019  ? ?Past Surgical History:  ?Procedure Laterality Date  ? A-FLUTTER ABLATION N/A 05/23/2020  ? Procedure: A-FLUTTER ABLATION;  Surgeon: Evans Lance, MD;  Location: [facility];  Service: Cardiovascular;  Laterality: N/A;  ? APPENDECTOMY    ? BACK SURGERY    ? bilateral inguinal hernia repair    ? CORONARY ARTERY BYPASS GRAFT  2001  ? after positive exercise treadmill test.  LIMA-LAD, Radial artery to LCx marginal, SVG to second diagonal, SVG to PDA  ? CORONARY STENT INTERVENTION N/A 05/08/2021  ? Procedure: CORONARY STENT INTERVENTION;  Surgeon: Belva Crome, MD;  Location: [facility];  Service: Cardiovascular;  Laterality: N/A;  ? LEFT HEART CATH AND CORONARY ANGIOGRAPHY N/A 06/01/2021  ? Procedure: LEFT HEART CATH AND CORONARY ANGIOGRAPHY;  Surgeon: Lorretta Harp, MD;  Location: [facility];  Service: Cardiovascular;  Laterality: N/A;  ? LEFT HEART CATH AND CORS/GRAFTS ANGIOGRAPHY N/A 05/08/2021  ? Procedure: LEFT HEART CATH AND CORS/GRAFTS ANGIOGRAPHY;  Surgeon: Belva Crome, MD;  Location: [facility];  Service: Cardiovascular;  Laterality: N/A;  ? TONSILLECTOMY AND ADENOIDECTOMY     ? ? ?Current Outpatient Medications  ?Medication Sig Dispense Refill  ? amiodarone (PACERONE) 200 MG tablet TAKE 1 TABLET (200 MG TOTAL) BY MOUTH DAILY. PLEASE SCHEDULE APPT FOR FUTURE REFILLS. 1ST ATTEMPT 60 tablet 1  ? aspirin EC 81 MG tablet Take 81 mg by mouth daily. Swallow whole.    ? atorvastatin (LIPITOR) 40 MG tablet Take 1 tablet (40 mg total) by mouth daily. 90 tablet 3  ? CINNAMON PO Take 1,000 mg by mouth daily.    ? clopidogrel (PLAVIX) 75 MG tablet Take 1 tablet (75 mg total) by mouth daily with breakfast. 30 tablet 11  ? cyanocobalamin 1000 MCG tablet Take 1,000 mcg by mouth daily.    ? metoprolol succinate (TOPROL-XL) 25 MG 24 hr tablet Take 25 mg by mouth daily.    ? nitroGLYCERIN (NITROSTAT) 0.4 MG SL tablet Place 1 tablet (0.4 mg total) under the tongue every 5 (five) minutes as needed for chest pain. (Patient not taking: Reported on 10/26/2021) 25 tablet 11  ? olmesartan (BENICAR) 20 MG tablet Take 20 mg by mouth in the morning.    ? SYMBICORT 160-4.5 MCG/ACT inhaler Inhale 2 puffs into the lungs 2 (two) times daily.    ? traMADol (ULTRAM) 50 MG tablet Take 50 mg by mouth 2 (two) times daily as needed (for pain).    ? ?No current facility-administered medications for this visit.  ? ? ?No Known Allergies ? ?Social History  ? ?Socioeconomic History  ? Marital status: Married  ?  Spouse name: Not on  file  ? Number of children: 3  ? Years of education: Not on file  ? Highest education level: Not on file  ?Occupational History  ? Occupation: [employer]  ?Tobacco Use  ? Smoking status: Former  ?  Types: Cigarettes  ?  Quit date: 08/31/1964  ?  Years since quitting: 57.4  ? Smokeless tobacco: Former  ?  Types: Chew  ?  Quit date: 04/24/2016  ?Vaping Use  ? Vaping Use: Never used  ?Substance and Sexual Activity  ? Alcohol use: No  ? Drug use: No  ? Sexual activity: Not on file  ?Other Topics Concern  ? Not on file  ?Social History Narrative  ? Not on file  ? ?Social Determinants of Health   ? ?Financial Resource Strain: Not on file  ?Food Insecurity: Not on file  ?Transportation Needs: Not on file  ?Physical Activity: Not on file  ?Stress: Not on file  ?Social Connections: Not on file  ?Intimate Partner Violence: Not on file  ? ? ? ?Review of Systems: ?All other systems reviewed and are otherwise negative except as noted above. ? ?Physical Exam: ?There were no vitals filed for this visit. ? ?GEN- The patient is well appearing, alert and oriented x 3 today.   ?HEENT: normocephalic, atraumatic; sclera clear, conjunctiva pink; hearing intact; oropharynx clear; neck supple, no JVP ?Lymph- no cervical lymphadenopathy ?Lungs- Clear to ausculation bilaterally, normal work of breathing.  No wheezes, rales, rhonchi ?Heart- Regular rate and rhythm, no murmurs, rubs or gallops, PMI not laterally displaced ?GI- soft, non-tender, non-distended, bowel sounds present, no hepatosplenomegaly ?Extremities- no clubbing, cyanosis, or edema; DP/PT/radial pulses 2+ bilaterally ?MS- no significant deformity or atrophy ?Skin- warm and dry, no rash or lesion ?Psych- euthymic mood, full affect ?Neuro- strength and sensation are intact ? ?EKG is ordered. Personal review of EKG from today shows NSR with 1 degree AV block at 87 bpm ? ?Additional studies reviewed include: ?Previous EP office notes.  ? ?Assessment and Plan: ? ?1. PVCs/ NSVT ?Stable on amiodarone. Decrease to 200 mg M-F none on Sat/Sun ?EKG today shows NSR, 1AVB ?Surveillance labs today.  ? ?2. Atrial flutter ?S/p ablation. Not on [place].  ? ?3. CAD s/p CABG ?Denies s/s ischemia ? ?Follow up with Dr. Lovena Le in 6 months  ? ?Shirley Friar, PA-C  ?01/12/22 ?1:45 PM  ?

## 2022-01-15 ENCOUNTER — Encounter: Payer: Self-pay | Admitting: Student

## 2022-01-15 ENCOUNTER — Ambulatory Visit (INDEPENDENT_AMBULATORY_CARE_PROVIDER_SITE_OTHER): Payer: Medicare Other | Admitting: Student

## 2022-01-15 ENCOUNTER — Encounter: Payer: Self-pay | Admitting: Hematology & Oncology

## 2022-01-15 VITALS — BP 124/64 | HR 87 | Ht 69.0 in | Wt 145.4 lb

## 2022-01-15 DIAGNOSIS — Z5181 Encounter for therapeutic drug level monitoring: Secondary | ICD-10-CM

## 2022-01-15 DIAGNOSIS — R002 Palpitations: Secondary | ICD-10-CM

## 2022-01-15 DIAGNOSIS — I4729 Other ventricular tachycardia: Secondary | ICD-10-CM | POA: Diagnosis not present

## 2022-01-15 DIAGNOSIS — I483 Typical atrial flutter: Secondary | ICD-10-CM

## 2022-01-15 DIAGNOSIS — I493 Ventricular premature depolarization: Secondary | ICD-10-CM

## 2022-01-15 LAB — TSH: TSH: 1.95 u[IU]/mL (ref 0.450–4.500)

## 2022-01-15 LAB — T4, FREE: Free T4: 1.69 ng/dL (ref 0.82–1.77)

## 2022-01-15 MED ORDER — AMIODARONE HCL 200 MG PO TABS: 200.0000 mg | ORAL_TABLET | ORAL | 3 refills | Status: DC

## 2022-01-15 NOTE — Patient Instructions (Signed)
Medication Instructions:  ?Your physician has recommended you make the following change in your medication:  ? ?CHANGE: Amiodarone '200mg'$  daily on Monday - Friday only ? ?*If you need a refill on your cardiac medications before your next appointment, please call your pharmacy* ? ? ?Lab Work: ?TODAY: TSH, FreeT4 ? ?If you have labs (blood work) drawn today and your tests are completely normal, you will receive your results only by: ?MyChart Message (if you have MyChart) OR ?A paper copy in the mail ?If you have any lab test that is abnormal or we need to change your treatment, we will call you to review the results. ? ?Follow-Up: ?At [facility], you and your health needs are our priority.  As part of our continuing mission to provide you with exceptional heart care, we have created designated Provider Care Teams.  These Care Teams include your primary Cardiologist (physician) and Advanced Practice Providers (APPs -  Physician Assistants and Nurse Practitioners) who all work together to provide you with the care you need, when you need it. ? ?We recommend signing up for the patient portal called "MyChart".  Sign up information is provided on this After Visit Summary.  MyChart is used to connect with patients for Virtual Visits (Telemedicine).  Patients are able to view lab/test results, encounter notes, upcoming appointments, etc.  Non-urgent messages can be sent to your provider as well.   ?To learn more about what you can do with MyChart, go to [email or web address].   ? ?Your next appointment:   ?6 month(s) ? ?The format for your next appointment:   ?In Person ? ?Provider:   ?You may see Cristopher Peru, MD or one of the following Advanced Practice Providers on your designated Care Team:   ?Tommye Standard, PA-C ?Legrand Como "Jonni Sanger" [place], PA-C  ? ?  ?

## 2022-01-15 NOTE — Addendum Note (Signed)
Addended by: Carylon Perches on: 01/15/2022 12:31 PM ? ? Modules accepted: Orders ? ?

## 2022-01-18 ENCOUNTER — Inpatient Hospital Stay (HOSPITAL_BASED_OUTPATIENT_CLINIC_OR_DEPARTMENT_OTHER): Payer: Medicare Other | Admitting: Hematology & Oncology

## 2022-01-18 ENCOUNTER — Encounter: Payer: Self-pay | Admitting: Hematology & Oncology

## 2022-01-18 ENCOUNTER — Inpatient Hospital Stay: Payer: Medicare Other

## 2022-01-18 ENCOUNTER — Telehealth: Payer: Self-pay | Admitting: *Deleted

## 2022-01-18 ENCOUNTER — Other Ambulatory Visit: Payer: Self-pay

## 2022-01-18 VITALS — BP 146/57 | HR 71 | Temp 98.2°F | Resp 18 | Wt 144.0 lb

## 2022-01-18 DIAGNOSIS — C9000 Multiple myeloma not having achieved remission: Secondary | ICD-10-CM

## 2022-01-18 DIAGNOSIS — Z5111 Encounter for antineoplastic chemotherapy: Secondary | ICD-10-CM | POA: Diagnosis not present

## 2022-01-18 LAB — CMP (CANCER CENTER ONLY)
ALT: 16 U/L (ref 0–44)
AST: 15 U/L (ref 15–41)
Albumin: 3.7 g/dL (ref 3.5–5.0)
Alkaline Phosphatase: 55 U/L (ref 38–126)
Anion gap: 5 (ref 5–15)
BUN: 16 mg/dL (ref 8–23)
CO2: 29 mmol/L (ref 22–32)
Calcium: 8.8 mg/dL — ABNORMAL LOW (ref 8.9–10.3)
Chloride: 108 mmol/L (ref 98–111)
Creatinine: 1.71 mg/dL — ABNORMAL HIGH (ref 0.61–1.24)
GFR, Estimated: 39 mL/min — ABNORMAL LOW (ref >60)
Glucose, Bld: 119 mg/dL — ABNORMAL HIGH (ref 70–99)
Potassium: 4.9 mmol/L (ref 3.5–5.1)
Sodium: 142 mmol/L (ref 135–145)
Total Bilirubin: 0.7 mg/dL (ref 0.3–1.2)
Total Protein: 6.1 g/dL — ABNORMAL LOW (ref 6.5–8.1)

## 2022-01-18 LAB — CBC WITH DIFFERENTIAL (CANCER CENTER ONLY)
Abs Immature Granulocytes: 0.06 10*3/uL (ref 0.00–0.07)
Basophils Absolute: 0.1 10*3/uL (ref 0.0–0.1)
Basophils Relative: 1 %
Eosinophils Absolute: 0.5 10*3/uL (ref 0.0–0.5)
Eosinophils Relative: 5 %
HCT: 35 % — ABNORMAL LOW (ref 39.0–52.0)
Hemoglobin: 10.7 g/dL — ABNORMAL LOW (ref 13.0–17.0)
Immature Granulocytes: 1 %
Lymphocytes Relative: 22 %
Lymphs Abs: 2.2 10*3/uL (ref 0.7–4.0)
MCH: 29 pg (ref 26.0–34.0)
MCHC: 30.6 g/dL (ref 30.0–36.0)
MCV: 94.9 fL (ref 80.0–100.0)
Monocytes Absolute: 1.1 10*3/uL — ABNORMAL HIGH (ref 0.1–1.0)
Monocytes Relative: 11 %
Neutro Abs: 5.9 10*3/uL (ref 1.7–7.7)
Neutrophils Relative %: 60 %
Platelet Count: 241 10*3/uL (ref 150–400)
RBC: 3.69 MIL/uL — ABNORMAL LOW (ref 4.22–5.81)
RDW: 16.4 % — ABNORMAL HIGH (ref 11.5–15.5)
WBC Count: 9.8 10*3/uL (ref 4.0–10.5)
nRBC: 0 % (ref 0.0–0.2)

## 2022-01-18 MED ORDER — BORTEZOMIB CHEMO SQ INJECTION 3.5 MG (2.5MG/ML)
1.3000 mg/m2 | Freq: Once | INTRAMUSCULAR | Status: AC
  Administered 2022-01-18: 2.25 mg via SUBCUTANEOUS
  Filled 2022-01-18: qty 0.9

## 2022-01-18 MED ORDER — PROCHLORPERAZINE MALEATE 10 MG PO TABS
10.0000 mg | ORAL_TABLET | Freq: Once | ORAL | Status: AC
  Administered 2022-01-18: 10 mg via ORAL
  Filled 2022-01-18: qty 1

## 2022-01-18 MED ORDER — DEXAMETHASONE 4 MG PO TABS
20.0000 mg | ORAL_TABLET | ORAL | Status: DC
  Administered 2022-01-18: 20 mg via ORAL
  Filled 2022-01-18: qty 5

## 2022-01-18 MED ORDER — ONDANSETRON HCL 8 MG PO TABS: 8.0000 mg | ORAL_TABLET | Freq: Three times a day (TID) | ORAL | 4 refills | Status: DC | PRN

## 2022-01-18 NOTE — Progress Notes (Signed)
Reviewed pt labs with Dr. Marin Olp and pt ok to treat with creatinine 1.7 ?

## 2022-01-18 NOTE — Telephone Encounter (Signed)
Per 01/18/22 los - gave upcoming appointments - confirmed ?

## 2022-01-18 NOTE — Patient Instructions (Signed)
[facility] ?Discharge Instructions for Patients Receiving Chemotherapy ? ?Today you received the following chemotherapy agents Velcade ? ?To help prevent nausea and vomiting after your treatment, we encourage you to take your nausea medication as prescribed by MD. ?  ?If you develop nausea and vomiting that is not controlled by your nausea medication, call the clinic.  ? ?BELOW ARE SYMPTOMS THAT SHOULD BE REPORTED IMMEDIATELY: ?*FEVER GREATER THAN 100.5 F ?*CHILLS WITH OR WITHOUT FEVER ?NAUSEA AND VOMITING THAT IS NOT CONTROLLED WITH YOUR NAUSEA MEDICATION ?*UNUSUAL SHORTNESS OF BREATH ?*UNUSUAL BRUISING OR BLEEDING ?TENDERNESS IN MOUTH AND THROAT WITH OR WITHOUT PRESENCE OF ULCERS ?*URINARY PROBLEMS ?*BOWEL PROBLEMS ?UNUSUAL RASH ?Items with * indicate a potential emergency and should be followed up as soon as possible. ? ?Feel free to call the clinic should you have any questions or concerns. The clinic phone number is [phone]. ? ?Please show the [facility] at check-in to the Emergency Department and triage nurse. ? ? ?

## 2022-01-18 NOTE — Progress Notes (Signed)
?Hematology and Oncology Follow Up Visit ? ?Shawn Bauer ?[id number] ?05-Oct-1933 88 y.o. ?01/18/2022 ? ? ?Principle Diagnosis:  ?IgG Kappa myeloma -- high risk due to t(4:21) ?  ?Current Therapy:        ?Velcade/Decadron -- started 10/09/2019, s/p cycle #`26--treatment frequency changed to every other week on 01/21/2021 ?Xgeva 120 mg IM q 3 months -- next dose on 12/2021 ?  ?Interim History: Shawn Bauer is here today for follow-up.  He is doing quite nicely.  As always, he and I talked about the current political environment in our [place]. ? ?He does have the atrial fibrillation.  He is on amiodarone.  He does have some nausea.  I will call in some Zofran for him. ? ?He has had no problems with bowels or bladder.  He has had no rashes.  There is been no leg swelling.  He has had no cough or shortness of breath. ? ?His last myeloma studies back in early April showed no monoclonal spike in his blood.  His IgG level was 700 mg/dL.  The Kappa light chain was 19.2 mg/dL. ? ?Overall, I would say his performance status for now is ECOG 1.    ? ?Medications:  ?Allergies as of 01/18/2022   ?No Known Allergies ?  ? ?  ?Medication List  ?  ? ?  ? Accurate as of January 18, 2022  9:34 AM. If you have any questions, ask your nurse or doctor.  ?  ?  ? ?  ? ?amiodarone 200 MG tablet ?Commonly known as: PACERONE ?Take 1 tablet (200 mg total) by mouth as directed. Take '200mg'$  daily Monday - Friday only. ?  ?aspirin EC 81 MG tablet ?Take 81 mg by mouth daily. Swallow whole. ?  ?atorvastatin 40 MG tablet ?Commonly known as: LIPITOR ?Take 1 tablet (40 mg total) by mouth daily. ?  ?CINNAMON PO ?Take 1,000 mg by mouth daily. ?  ?clopidogrel 75 MG tablet ?Commonly known as: PLAVIX ?Take 1 tablet (75 mg total) by mouth daily with breakfast. ?  ?cyanocobalamin 1000 MCG tablet ?Take 1,000 mcg by mouth daily. ?  ?metoprolol succinate 25 MG 24 hr tablet ?Commonly known as: TOPROL-XL ?Take 25 mg by mouth daily. ?  ?nitroGLYCERIN 0.4 MG SL  tablet ?Commonly known as: NITROSTAT ?Place 1 tablet (0.4 mg total) under the tongue every 5 (five) minutes as needed for chest pain. ?  ?olmesartan 20 MG tablet ?Commonly known as: BENICAR ?Take 20 mg by mouth in the morning. ?  ?Symbicort 160-4.5 MCG/ACT inhaler ?Generic drug: budesonide-formoterol ?Inhale 2 puffs into the lungs 2 (two) times daily. ?  ?traMADol 50 MG tablet ?Commonly known as: ULTRAM ?Take 50 mg by mouth 2 (two) times daily as needed (for pain). ?  ? ?  ? ? ?Allergies: No Known Allergies ? ?Past Medical History, Surgical history, Social history, and Family History were reviewed and updated. ? ?Review of Systems: ?Review of Systems  ?Constitutional: Negative.   ?HENT: Negative.    ?Eyes: Negative.   ?Respiratory: Negative.    ?Cardiovascular:  Positive for palpitations.  ?Gastrointestinal: Negative.   ?Genitourinary: Negative.   ?Musculoskeletal: Negative.   ?Skin: Negative.   ?Neurological: Negative.   ?Endo/Heme/Allergies: Negative.   ?Psychiatric/Behavioral: Negative.    ? ? ?Physical Exam: ? weight is 144 lb (65.3 kg). His oral temperature is 98.2 ?F (36.8 ?C). His blood pressure is 146/57 (abnormal) and his pulse is 71. His respiration is 18 and oxygen saturation is 100%.  ? ?Wt  Readings from Last 3 Encounters:  ?01/18/22 144 lb (65.3 kg)  ?01/15/22 145 lb 6.4 oz (66 kg)  ?12/21/21 142 lb (64.4 kg)  ? ? ?Physical Exam ?Vitals reviewed.  ?HENT:  ?   Head: Normocephalic and atraumatic.  ?Eyes:  ?   Pupils: Pupils are equal, round, and reactive to light.  ?Cardiovascular:  ?   Rate and Rhythm: Normal rate and regular rhythm.  ?   Heart sounds: Normal heart sounds.  ?Pulmonary:  ?   Effort: Pulmonary effort is normal.  ?   Breath sounds: Normal breath sounds.  ?Abdominal:  ?   General: Bowel sounds are normal.  ?   Palpations: Abdomen is soft.  ?Musculoskeletal:     ?   General: No tenderness or deformity. Normal range of motion.  ?   Cervical back: Normal range of motion.  ?Lymphadenopathy:  ?    Cervical: No cervical adenopathy.  ?Skin: ?   General: Skin is warm and dry.  ?   Findings: No erythema or rash.  ?Neurological:  ?   Mental Status: He is alert and oriented to person, place, and time.  ?Psychiatric:     ?   Behavior: Behavior normal.     ?   Thought Content: Thought content normal.     ?   Judgment: Judgment normal.  ? ? ? ?Lab Results  ?Component Value Date  ? WBC 9.8 01/18/2022  ? HGB 10.7 (L) 01/18/2022  ? HCT 35.0 (L) 01/18/2022  ? MCV 94.9 01/18/2022  ? PLT 241 01/18/2022  ? ?No results found for: FERRITIN, IRON, TIBC, UIBC, IRONPCTSAT ?Lab Results  ?Component Value Date  ? RETICCTPCT 2.0 12/21/2021  ? RBC 3.69 (L) 01/18/2022  ? ?Lab Results  ?Component Value Date  ? KPAFRELGTCHN 191.8 (H) 01/04/2022  ? LAMBDASER 10.2 01/04/2022  ? KAPLAMBRATIO 18.80 (H) 01/04/2022  ? ?Lab Results  ?Component Value Date  ? IGGSERUM 696 01/04/2022  ? IGA 131 01/04/2022  ? IGMSERUM 27 01/04/2022  ? ?Lab Results  ?Component Value Date  ? TOTALPROTELP 5.7 (L) 01/04/2022  ? ALBUMINELP 2.9 01/04/2022  ? A1GS 0.3 01/04/2022  ? A2GS 0.9 01/04/2022  ? BETS 0.9 01/04/2022  ? GAMS 0.7 01/04/2022  ? MSPIKE Not Observed 01/04/2022  ? SPEI Comment 04/03/2021  ? ?  Chemistry   ?   ?Component Value Date/Time  ? NA 142 01/18/2022 0834  ? K 4.9 01/18/2022 0834  ? CL 108 01/18/2022 0834  ? CO2 29 01/18/2022 0834  ? BUN 16 01/18/2022 0834  ? CREATININE 1.71 (H) 01/18/2022 0834  ?    ?Component Value Date/Time  ? CALCIUM 8.8 (L) 01/18/2022 0834  ? ALKPHOS 55 01/18/2022 0834  ? AST 15 01/18/2022 0834  ? ALT 16 01/18/2022 0834  ? BILITOT 0.7 01/18/2022 0834  ?  ? ? ? ?Impression and Plan: Shawn Bauer is a very pleasant 88 yo caucasian gentleman with IgG kappa myeloma.  So far, everything is going nicely with respect to the myeloma.  So far the myeloma has been holding pretty steady. ? ?His hemoglobin has been a little on the low side.  However, it is holding steady. ? ?His erythropoietin level back in January was 28.  I suppose we  could always consider using ESA but he is really not symptomatic with this. ? ?We will continue to treat him every 2 weeks. ? ?I will see him back in 1 month.   ? ? ?[name], MD ?4/27/20239:34 AM ?

## 2022-02-15 ENCOUNTER — Inpatient Hospital Stay: Payer: Medicare Other | Attending: Family

## 2022-02-15 ENCOUNTER — Telehealth: Payer: Self-pay | Admitting: *Deleted

## 2022-02-15 ENCOUNTER — Inpatient Hospital Stay: Payer: Medicare Other

## 2022-02-15 ENCOUNTER — Other Ambulatory Visit: Payer: Self-pay

## 2022-02-15 ENCOUNTER — Inpatient Hospital Stay (HOSPITAL_BASED_OUTPATIENT_CLINIC_OR_DEPARTMENT_OTHER): Payer: Medicare Other | Admitting: Hematology & Oncology

## 2022-02-15 ENCOUNTER — Encounter: Payer: Self-pay | Admitting: Hematology & Oncology

## 2022-02-15 VITALS — BP 134/52 | HR 70 | Temp 98.6°F | Resp 16 | Wt 141.0 lb

## 2022-02-15 DIAGNOSIS — C9000 Multiple myeloma not having achieved remission: Secondary | ICD-10-CM

## 2022-02-15 DIAGNOSIS — Z5111 Encounter for antineoplastic chemotherapy: Secondary | ICD-10-CM | POA: Insufficient documentation

## 2022-02-15 DIAGNOSIS — N289 Disorder of kidney and ureter, unspecified: Secondary | ICD-10-CM | POA: Diagnosis not present

## 2022-02-15 LAB — CMP (CANCER CENTER ONLY)
ALT: 17 U/L (ref 0–44)
AST: 17 U/L (ref 15–41)
Albumin: 3.9 g/dL (ref 3.5–5.0)
Alkaline Phosphatase: 53 U/L (ref 38–126)
Anion gap: 6 (ref 5–15)
BUN: 28 mg/dL — ABNORMAL HIGH (ref 8–23)
CO2: 29 mmol/L (ref 22–32)
Calcium: 9.5 mg/dL (ref 8.9–10.3)
Chloride: 106 mmol/L (ref 98–111)
Creatinine: 2.03 mg/dL — ABNORMAL HIGH (ref 0.61–1.24)
GFR, Estimated: 32 mL/min — ABNORMAL LOW (ref >60)
Glucose, Bld: 122 mg/dL — ABNORMAL HIGH (ref 70–99)
Potassium: 5.4 mmol/L — ABNORMAL HIGH (ref 3.5–5.1)
Sodium: 141 mmol/L (ref 135–145)
Total Bilirubin: 0.6 mg/dL (ref 0.3–1.2)
Total Protein: 6.3 g/dL — ABNORMAL LOW (ref 6.5–8.1)

## 2022-02-15 LAB — CBC WITH DIFFERENTIAL (CANCER CENTER ONLY)
Abs Immature Granulocytes: 0.04 10*3/uL (ref 0.00–0.07)
Basophils Absolute: 0.1 10*3/uL (ref 0.0–0.1)
Basophils Relative: 1 %
Eosinophils Absolute: 0.4 10*3/uL (ref 0.0–0.5)
Eosinophils Relative: 4 %
HCT: 34.5 % — ABNORMAL LOW (ref 39.0–52.0)
Hemoglobin: 10.8 g/dL — ABNORMAL LOW (ref 13.0–17.0)
Immature Granulocytes: 0 %
Lymphocytes Relative: 24 %
Lymphs Abs: 2.2 10*3/uL (ref 0.7–4.0)
MCH: 29.5 pg (ref 26.0–34.0)
MCHC: 31.3 g/dL (ref 30.0–36.0)
MCV: 94.3 fL (ref 80.0–100.0)
Monocytes Absolute: 1.2 10*3/uL — ABNORMAL HIGH (ref 0.1–1.0)
Monocytes Relative: 13 %
Neutro Abs: 5.5 10*3/uL (ref 1.7–7.7)
Neutrophils Relative %: 58 %
Platelet Count: 211 10*3/uL (ref 150–400)
RBC: 3.66 MIL/uL — ABNORMAL LOW (ref 4.22–5.81)
RDW: 16.5 % — ABNORMAL HIGH (ref 11.5–15.5)
WBC Count: 9.5 10*3/uL (ref 4.0–10.5)
nRBC: 0 % (ref 0.0–0.2)

## 2022-02-15 LAB — LACTATE DEHYDROGENASE: LDH: 188 U/L (ref 98–192)

## 2022-02-15 MED ORDER — DEXAMETHASONE 4 MG PO TABS
20.0000 mg | ORAL_TABLET | ORAL | Status: DC
  Administered 2022-02-15: 20 mg via ORAL
  Filled 2022-02-15: qty 5

## 2022-02-15 MED ORDER — BORTEZOMIB CHEMO SQ INJECTION 3.5 MG (2.5MG/ML)
1.3000 mg/m2 | Freq: Once | INTRAMUSCULAR | Status: AC
  Administered 2022-02-15: 2.25 mg via SUBCUTANEOUS
  Filled 2022-02-15: qty 0.9

## 2022-02-15 MED ORDER — PROCHLORPERAZINE MALEATE 10 MG PO TABS
10.0000 mg | ORAL_TABLET | Freq: Once | ORAL | Status: AC
  Administered 2022-02-15: 10 mg via ORAL
  Filled 2022-02-15: qty 1

## 2022-02-15 NOTE — Progress Notes (Signed)
Hematology and Oncology Follow Up Visit  Shawn Bauer [facility] [id number] 1933/11/11 88 y.o. 02/15/2022   Principle Diagnosis:  IgG Kappa myeloma -- high risk due to t(4:21)   Current Therapy:        Velcade/Decadron -- started 10/09/2019, s/p cycle #`26--treatment frequency changed to every other week on 01/21/2021 Xgeva 120 mg IM q 3 months -- next dose on 12/2021   Interim History: Shawn Bauer is here today for follow-up.  So far, he really has had no problems.  He might be a little bit constipated.  He has had no issues with nausea or vomiting.  He has had no cough or shortness of breath.  His atrial fibrillation seems to be under fairly good control right now.  He is on amiodarone.  His last myeloma studies did not show a monoclonal spike in his blood.  His IgG level was 700 mg/dL.  The Kappa light chain was 19.1 mg/dL.  Overall, I would say his performance status is probably ECOG 1.       Medications:  Allergies as of 02/15/2022   No Known Allergies      Medication List        Accurate as of Feb 15, 2022 10:03 AM. If you have any questions, ask your nurse or doctor.          amiodarone 200 MG tablet Commonly known as: PACERONE Take 1 tablet (200 mg total) by mouth as directed. Take '200mg'$  daily Monday - Friday only.   aspirin EC 81 MG tablet Take 81 mg by mouth daily. Swallow whole.   atorvastatin 40 MG tablet Commonly known as: LIPITOR Take 1 tablet (40 mg total) by mouth daily.   CINNAMON PO Take 1,000 mg by mouth daily.   clopidogrel 75 MG tablet Commonly known as: PLAVIX Take 1 tablet (75 mg total) by mouth daily with breakfast.   cyanocobalamin 1000 MCG tablet Take 1,000 mcg by mouth daily.   melatonin 5 MG Tabs Take 15 mg by mouth as needed.   metoprolol succinate 25 MG 24 hr tablet Commonly known as: TOPROL-XL Take 25 mg by mouth daily.   nitroGLYCERIN 0.4 MG SL tablet Commonly known as: NITROSTAT Place 1 tablet (0.4 mg total) under the tongue  every 5 (five) minutes as needed for chest pain.   olmesartan 20 MG tablet Commonly known as: BENICAR Take 20 mg by mouth in the morning.   ondansetron 8 MG tablet Commonly known as: ZOFRAN Take 1 tablet (8 mg total) by mouth every 8 (eight) hours as needed for nausea or vomiting.   Symbicort 160-4.5 MCG/ACT inhaler Generic drug: budesonide-formoterol Inhale 2 puffs into the lungs 2 (two) times daily.   traMADol 50 MG tablet Commonly known as: ULTRAM Take 50 mg by mouth 2 (two) times daily as needed (for pain).        Allergies: No Known Allergies  Past Medical History, Surgical history, Social history, and Family History were reviewed and updated.  Review of Systems: Review of Systems  Constitutional: Negative.   HENT: Negative.    Eyes: Negative.   Respiratory: Negative.    Cardiovascular:  Positive for palpitations.  Gastrointestinal: Negative.   Genitourinary: Negative.   Musculoskeletal: Negative.   Skin: Negative.   Neurological: Negative.   Endo/Heme/Allergies: Negative.   Psychiatric/Behavioral: Negative.      Physical Exam:  weight is 141 lb (64 kg). His oral temperature is 98.6 F (37 C). His blood pressure is 134/52 (abnormal) and his pulse is  70. His respiration is 16 and oxygen saturation is 97%.   Wt Readings from Last 3 Encounters:  02/15/22 141 lb (64 kg)  01/18/22 144 lb (65.3 kg)  01/15/22 145 lb 6.4 oz (66 kg)    Physical Exam Vitals reviewed.  HENT:     Head: Normocephalic and atraumatic.  Eyes:     Pupils: Pupils are equal, round, and reactive to light.  Cardiovascular:     Rate and Rhythm: Normal rate and regular rhythm.     Heart sounds: Normal heart sounds.  Pulmonary:     Effort: Pulmonary effort is normal.     Breath sounds: Normal breath sounds.  Abdominal:     General: Bowel sounds are normal.     Palpations: Abdomen is soft.  Musculoskeletal:        General: No tenderness or deformity. Normal range of motion.      Cervical back: Normal range of motion.  Lymphadenopathy:     Cervical: No cervical adenopathy.  Skin:    General: Skin is warm and dry.     Findings: No erythema or rash.  Neurological:     Mental Status: He is alert and oriented to person, place, and time.  Psychiatric:        Behavior: Behavior normal.        Thought Content: Thought content normal.        Judgment: Judgment normal.     Lab Results  Component Value Date   WBC 9.5 02/15/2022   HGB 10.8 (L) 02/15/2022   HCT 34.5 (L) 02/15/2022   MCV 94.3 02/15/2022   PLT 211 02/15/2022   No results found for: FERRITIN, IRON, TIBC, UIBC, IRONPCTSAT Lab Results  Component Value Date   RETICCTPCT 2.0 12/21/2021   RBC 3.66 (L) 02/15/2022   Lab Results  Component Value Date   KPAFRELGTCHN 191.8 (H) 01/04/2022   LAMBDASER 10.2 01/04/2022   KAPLAMBRATIO 18.80 (H) 01/04/2022   Lab Results  Component Value Date   IGGSERUM 696 01/04/2022   IGA 131 01/04/2022   IGMSERUM 27 01/04/2022   Lab Results  Component Value Date   TOTALPROTELP 5.7 (L) 01/04/2022   ALBUMINELP 2.9 01/04/2022   A1GS 0.3 01/04/2022   A2GS 0.9 01/04/2022   BETS 0.9 01/04/2022   GAMS 0.7 01/04/2022   MSPIKE Not Observed 01/04/2022   SPEI Comment 04/03/2021     Chemistry      Component Value Date/Time   NA 141 02/15/2022 0848   K 5.4 (H) 02/15/2022 0848   CL 106 02/15/2022 0848   CO2 29 02/15/2022 0848   BUN 28 (H) 02/15/2022 0848   CREATININE 2.03 (H) 02/15/2022 0848      Component Value Date/Time   CALCIUM 9.5 02/15/2022 0848   ALKPHOS 53 02/15/2022 0848   AST 17 02/15/2022 0848   ALT 17 02/15/2022 0848   BILITOT 0.6 02/15/2022 0848       Impression and Plan: Shawn Bauer is a very pleasant 88 yo caucasian gentleman with IgG kappa myeloma.  So far, his myeloma levels have been trending downward.  There is certainly going quite slow.  As such, we may have to think about adding daratumumab to the mix.  I guess we could always consider  pomalidomide or Revlimid although he does have renal insufficiency.  For right now, we will just keep him on course.  He is treated every other week.  I will plan to see him back in 1 month.   Rudell Cobb  Marin Olp, MD 5/25/202310:03 AM

## 2022-02-15 NOTE — Patient Instructions (Signed)
[facility] [place]  Discharge Instructions: Thank you for choosing [facility] to provide your oncology and hematology care.   If you have a lab appointment with the [facility], please go directly to the [facility] and check in at the registration area.  Wear comfortable clothing and clothing appropriate for easy access to any Portacath or PICC line.   We strive to give you quality time with your provider. You may need to reschedule your appointment if you arrive late (15 or more minutes).  Arriving late affects you and other patients whose appointments are after yours.  Also, if you miss three or more appointments without notifying the office, you may be dismissed from the clinic at the provider's discretion.      For prescription refill requests, have your pharmacy contact our office and allow 72 hours for refills to be completed.    Today you received the following chemotherapy and/or immunotherapy agents Velcade      To help prevent nausea and vomiting after your treatment, we encourage you to take your nausea medication as directed.  BELOW ARE SYMPTOMS THAT SHOULD BE REPORTED IMMEDIATELY: *FEVER GREATER THAN 100.4 F (38 C) OR HIGHER *CHILLS OR SWEATING *NAUSEA AND VOMITING THAT IS NOT CONTROLLED WITH YOUR NAUSEA MEDICATION *UNUSUAL SHORTNESS OF BREATH *UNUSUAL BRUISING OR BLEEDING *URINARY PROBLEMS (pain or burning when urinating, or frequent urination) *BOWEL PROBLEMS (unusual diarrhea, constipation, pain near the anus) TENDERNESS IN MOUTH AND THROAT WITH OR WITHOUT PRESENCE OF ULCERS (sore throat, sores in mouth, or a toothache) UNUSUAL RASH, SWELLING OR PAIN  UNUSUAL VAGINAL DISCHARGE OR ITCHING   Items with * indicate a potential emergency and should be followed up as soon as possible or go to the Emergency Department if any problems should occur.  Please show the CHEMOTHERAPY ALERT CARD or IMMUNOTHERAPY ALERT CARD at check-in to the  Emergency Department and triage nurse. Should you have questions after your visit or need to cancel or reschedule your appointment, please contact [facility]  [phone] and follow the prompts.  Office hours are 8:00 a.m. to 4:30 p.m. Monday - Friday. Please note that voicemails left after 4:00 p.m. may not be returned until the following business day.  We are closed weekends and major holidays. You have access to a nurse at all times for urgent questions. Please call the main number to the clinic [phone] and follow the prompts.  For any non-urgent questions, you may also contact your provider using MyChart. We now offer e-Visits for anyone 48 and older to request care online for non-urgent symptoms. For details visit [email or web address].   Also download the MyChart app! Go to the app store, search "MyChart", open the app, select [facility], and log in with your MyChart username and password.  Due to Covid, a mask is required upon entering the hospital/clinic. If you do not have a mask, one will be given to you upon arrival. For doctor visits, patients may have 1 support person aged 51 or older with them. For treatment visits, patients cannot have anyone with them due to current Covid guidelines and our immunocompromised population.

## 2022-02-15 NOTE — Progress Notes (Signed)
Ok to treat with creatinine of 2.03 per Dr Marin Olp. dph

## 2022-02-15 NOTE — Telephone Encounter (Signed)
Per 02/15/22 los - gave upcoming appointments - confirmed

## 2022-02-16 LAB — IGG, IGA, IGM
IgA: 134 mg/dL (ref 61–437)
IgG (Immunoglobin G), Serum: 681 mg/dL (ref 603–1613)
IgM (Immunoglobulin M), Srm: 31 mg/dL (ref 15–143)

## 2022-02-16 LAB — KAPPA/LAMBDA LIGHT CHAINS
Kappa free light chain: 211.8 mg/L — ABNORMAL HIGH (ref 3.3–19.4)
Kappa, lambda light chain ratio: 20.76 — ABNORMAL HIGH (ref 0.26–1.65)
Lambda free light chains: 10.2 mg/L (ref 5.7–26.3)

## 2022-02-17 ENCOUNTER — Other Ambulatory Visit: Payer: Self-pay | Admitting: Physician Assistant

## 2022-02-20 LAB — PROTEIN ELECTROPHORESIS, SERUM, WITH REFLEX
A/G Ratio: 1.3 (ref 0.7–1.7)
Albumin ELP: 3.4 g/dL (ref 2.9–4.4)
Alpha-1-Globulin: 0.3 g/dL (ref 0.0–0.4)
Alpha-2-Globulin: 0.8 g/dL (ref 0.4–1.0)
Beta Globulin: 0.9 g/dL (ref 0.7–1.3)
Gamma Globulin: 0.7 g/dL (ref 0.4–1.8)
Globulin, Total: 2.7 g/dL (ref 2.2–3.9)
Total Protein ELP: 6.1 g/dL (ref 6.0–8.5)

## 2022-03-01 ENCOUNTER — Inpatient Hospital Stay: Payer: Medicare Other

## 2022-03-01 ENCOUNTER — Inpatient Hospital Stay: Payer: Medicare Other | Attending: Family

## 2022-03-01 ENCOUNTER — Other Ambulatory Visit: Payer: Self-pay | Admitting: Oncology

## 2022-03-01 VITALS — BP 123/53 | HR 76 | Temp 98.2°F | Resp 17

## 2022-03-01 DIAGNOSIS — Z5111 Encounter for antineoplastic chemotherapy: Secondary | ICD-10-CM | POA: Insufficient documentation

## 2022-03-01 DIAGNOSIS — C9000 Multiple myeloma not having achieved remission: Secondary | ICD-10-CM | POA: Insufficient documentation

## 2022-03-01 LAB — CMP (CANCER CENTER ONLY)
ALT: 17 U/L (ref 0–44)
AST: 17 U/L (ref 15–41)
Albumin: 3.8 g/dL (ref 3.5–5.0)
Alkaline Phosphatase: 56 U/L (ref 38–126)
Anion gap: 4 — ABNORMAL LOW (ref 5–15)
BUN: 21 mg/dL (ref 8–23)
CO2: 30 mmol/L (ref 22–32)
Calcium: 9 mg/dL (ref 8.9–10.3)
Chloride: 103 mmol/L (ref 98–111)
Creatinine: 1.73 mg/dL — ABNORMAL HIGH (ref 0.61–1.24)
GFR, Estimated: 38 mL/min — ABNORMAL LOW (ref >60)
Glucose, Bld: 122 mg/dL — ABNORMAL HIGH (ref 70–99)
Potassium: 4.5 mmol/L (ref 3.5–5.1)
Sodium: 137 mmol/L (ref 135–145)
Total Bilirubin: 0.6 mg/dL (ref 0.3–1.2)
Total Protein: 5.9 g/dL — ABNORMAL LOW (ref 6.5–8.1)

## 2022-03-01 LAB — CBC WITH DIFFERENTIAL (CANCER CENTER ONLY)
Abs Immature Granulocytes: 0.05 10*3/uL (ref 0.00–0.07)
Basophils Absolute: 0.1 10*3/uL (ref 0.0–0.1)
Basophils Relative: 1 %
Eosinophils Absolute: 0.2 10*3/uL (ref 0.0–0.5)
Eosinophils Relative: 2 %
HCT: 33.3 % — ABNORMAL LOW (ref 39.0–52.0)
Hemoglobin: 10.4 g/dL — ABNORMAL LOW (ref 13.0–17.0)
Immature Granulocytes: 0 %
Lymphocytes Relative: 13 %
Lymphs Abs: 1.6 10*3/uL (ref 0.7–4.0)
MCH: 29.5 pg (ref 26.0–34.0)
MCHC: 31.2 g/dL (ref 30.0–36.0)
MCV: 94.6 fL (ref 80.0–100.0)
Monocytes Absolute: 1.1 10*3/uL — ABNORMAL HIGH (ref 0.1–1.0)
Monocytes Relative: 9 %
Neutro Abs: 9.2 10*3/uL — ABNORMAL HIGH (ref 1.7–7.7)
Neutrophils Relative %: 75 %
Platelet Count: 203 10*3/uL (ref 150–400)
RBC: 3.52 MIL/uL — ABNORMAL LOW (ref 4.22–5.81)
RDW: 16.4 % — ABNORMAL HIGH (ref 11.5–15.5)
WBC Count: 12.2 10*3/uL — ABNORMAL HIGH (ref 4.0–10.5)
nRBC: 0 % (ref 0.0–0.2)

## 2022-03-01 MED ORDER — DEXAMETHASONE 4 MG PO TABS
20.0000 mg | ORAL_TABLET | ORAL | Status: DC
  Administered 2022-03-01: 20 mg via ORAL
  Filled 2022-03-01: qty 5

## 2022-03-01 MED ORDER — BORTEZOMIB CHEMO SQ INJECTION 3.5 MG (2.5MG/ML)
1.3000 mg/m2 | Freq: Once | INTRAMUSCULAR | Status: AC
  Administered 2022-03-01: 2.25 mg via SUBCUTANEOUS
  Filled 2022-03-01: qty 0.9

## 2022-03-01 MED ORDER — PROCHLORPERAZINE MALEATE 10 MG PO TABS
10.0000 mg | ORAL_TABLET | Freq: Once | ORAL | Status: AC
  Administered 2022-03-01: 10 mg via ORAL
  Filled 2022-03-01: qty 1

## 2022-03-01 NOTE — Progress Notes (Signed)
OK to treat with ANC level from today per Dr. Marin Olp.

## 2022-03-01 NOTE — Patient Instructions (Signed)
[facility] [place]  Discharge Instructions: Thank you for choosing [facility] to provide your oncology and hematology care.   If you have a lab appointment with the [facility], please go directly to the [facility] and check in at the registration area.  Wear comfortable clothing and clothing appropriate for easy access to any Portacath or PICC line.   We strive to give you quality time with your provider. You may need to reschedule your appointment if you arrive late (15 or more minutes).  Arriving late affects you and other patients whose appointments are after yours.  Also, if you miss three or more appointments without notifying the office, you may be dismissed from the clinic at the provider's discretion.      For prescription refill requests, have your pharmacy contact our office and allow 72 hours for refills to be completed.    Today you received the following chemotherapy and/or immunotherapy agents Velcade.      To help prevent nausea and vomiting after your treatment, we encourage you to take your nausea medication as directed.  BELOW ARE SYMPTOMS THAT SHOULD BE REPORTED IMMEDIATELY: *FEVER GREATER THAN 100.4 F (38 C) OR HIGHER *CHILLS OR SWEATING *NAUSEA AND VOMITING THAT IS NOT CONTROLLED WITH YOUR NAUSEA MEDICATION *UNUSUAL SHORTNESS OF BREATH *UNUSUAL BRUISING OR BLEEDING *URINARY PROBLEMS (pain or burning when urinating, or frequent urination) *BOWEL PROBLEMS (unusual diarrhea, constipation, pain near the anus) TENDERNESS IN MOUTH AND THROAT WITH OR WITHOUT PRESENCE OF ULCERS (sore throat, sores in mouth, or a toothache) UNUSUAL RASH, SWELLING OR PAIN  UNUSUAL VAGINAL DISCHARGE OR ITCHING   Items with * indicate a potential emergency and should be followed up as soon as possible or go to the Emergency Department if any problems should occur.  Please show the CHEMOTHERAPY ALERT CARD or IMMUNOTHERAPY ALERT CARD at check-in to the  Emergency Department and triage nurse. Should you have questions after your visit or need to cancel or reschedule your appointment, please contact [facility]  [phone] and follow the prompts.  Office hours are 8:00 a.m. to 4:30 p.m. Monday - Friday. Please note that voicemails left after 4:00 p.m. may not be returned until the following business day.  We are closed weekends and major holidays. You have access to a nurse at all times for urgent questions. Please call the main number to the clinic [phone] and follow the prompts.  For any non-urgent questions, you may also contact your provider using MyChart. We now offer e-Visits for anyone 33 and older to request care online for non-urgent symptoms. For details visit [email or web address].   Also download the MyChart app! Go to the app store, search "MyChart", open the app, select [facility], and log in with your MyChart username and password.  Due to Covid, a mask is required upon entering the hospital/clinic. If you do not have a mask, one will be given to you upon arrival. For doctor visits, patients may have 1 support person aged 40 or older with them. For treatment visits, patients cannot have anyone with them due to current Covid guidelines and our immunocompromised population.

## 2022-03-10 ENCOUNTER — Other Ambulatory Visit: Payer: Self-pay | Admitting: Physician Assistant

## 2022-03-15 ENCOUNTER — Inpatient Hospital Stay: Payer: Medicare Other

## 2022-03-15 ENCOUNTER — Inpatient Hospital Stay (HOSPITAL_BASED_OUTPATIENT_CLINIC_OR_DEPARTMENT_OTHER): Payer: Medicare Other | Admitting: Hematology & Oncology

## 2022-03-15 ENCOUNTER — Telehealth: Payer: Self-pay | Admitting: *Deleted

## 2022-03-15 ENCOUNTER — Encounter: Payer: Self-pay | Admitting: Hematology & Oncology

## 2022-03-15 ENCOUNTER — Other Ambulatory Visit: Payer: Self-pay

## 2022-03-15 VITALS — BP 143/56 | HR 66 | Temp 98.4°F | Resp 18

## 2022-03-15 VITALS — BP 141/53 | HR 68 | Temp 98.0°F | Resp 18 | Ht 69.0 in | Wt 140.1 lb

## 2022-03-15 DIAGNOSIS — D631 Anemia in chronic kidney disease: Secondary | ICD-10-CM | POA: Insufficient documentation

## 2022-03-15 DIAGNOSIS — Z5111 Encounter for antineoplastic chemotherapy: Secondary | ICD-10-CM | POA: Diagnosis not present

## 2022-03-15 DIAGNOSIS — C9000 Multiple myeloma not having achieved remission: Secondary | ICD-10-CM | POA: Diagnosis not present

## 2022-03-15 DIAGNOSIS — N2889 Other specified disorders of kidney and ureter: Secondary | ICD-10-CM

## 2022-03-15 DIAGNOSIS — N289 Disorder of kidney and ureter, unspecified: Secondary | ICD-10-CM | POA: Diagnosis not present

## 2022-03-15 DIAGNOSIS — N183 Chronic kidney disease, stage 3 unspecified: Secondary | ICD-10-CM

## 2022-03-15 HISTORY — DX: Chronic kidney disease, stage 3 unspecified: N18.30

## 2022-03-15 HISTORY — DX: Anemia in chronic kidney disease: D63.1

## 2022-03-15 LAB — CMP (CANCER CENTER ONLY)
ALT: 17 U/L (ref 0–44)
AST: 17 U/L (ref 15–41)
Albumin: 4 g/dL (ref 3.5–5.0)
Alkaline Phosphatase: 51 U/L (ref 38–126)
Anion gap: 7 (ref 5–15)
BUN: 28 mg/dL — ABNORMAL HIGH (ref 8–23)
CO2: 30 mmol/L (ref 22–32)
Calcium: 9.3 mg/dL (ref 8.9–10.3)
Chloride: 103 mmol/L (ref 98–111)
Creatinine: 1.96 mg/dL — ABNORMAL HIGH (ref 0.61–1.24)
GFR, Estimated: 33 mL/min — ABNORMAL LOW (ref >60)
Glucose, Bld: 118 mg/dL — ABNORMAL HIGH (ref 70–99)
Potassium: 4.5 mmol/L (ref 3.5–5.1)
Sodium: 140 mmol/L (ref 135–145)
Total Bilirubin: 0.6 mg/dL (ref 0.3–1.2)
Total Protein: 6.4 g/dL — ABNORMAL LOW (ref 6.5–8.1)

## 2022-03-15 LAB — CBC WITH DIFFERENTIAL (CANCER CENTER ONLY)
Abs Immature Granulocytes: 0.05 10*3/uL (ref 0.00–0.07)
Basophils Absolute: 0.1 10*3/uL (ref 0.0–0.1)
Basophils Relative: 1 %
Eosinophils Absolute: 0.4 10*3/uL (ref 0.0–0.5)
Eosinophils Relative: 5 %
HCT: 34.9 % — ABNORMAL LOW (ref 39.0–52.0)
Hemoglobin: 10.6 g/dL — ABNORMAL LOW (ref 13.0–17.0)
Immature Granulocytes: 1 %
Lymphocytes Relative: 23 %
Lymphs Abs: 2.2 10*3/uL (ref 0.7–4.0)
MCH: 29.3 pg (ref 26.0–34.0)
MCHC: 30.4 g/dL (ref 30.0–36.0)
MCV: 96.4 fL (ref 80.0–100.0)
Monocytes Absolute: 1 10*3/uL (ref 0.1–1.0)
Monocytes Relative: 11 %
Neutro Abs: 5.5 10*3/uL (ref 1.7–7.7)
Neutrophils Relative %: 59 %
Platelet Count: 209 10*3/uL (ref 150–400)
RBC: 3.62 MIL/uL — ABNORMAL LOW (ref 4.22–5.81)
RDW: 16.4 % — ABNORMAL HIGH (ref 11.5–15.5)
WBC Count: 9.2 10*3/uL (ref 4.0–10.5)
nRBC: 0 % (ref 0.0–0.2)

## 2022-03-15 LAB — LACTATE DEHYDROGENASE: LDH: 189 U/L (ref 98–192)

## 2022-03-15 MED ORDER — BORTEZOMIB CHEMO SQ INJECTION 3.5 MG (2.5MG/ML)
1.3000 mg/m2 | Freq: Once | INTRAMUSCULAR | Status: AC
  Administered 2022-03-15: 2.25 mg via SUBCUTANEOUS
  Filled 2022-03-15: qty 0.9

## 2022-03-15 MED ORDER — PROCHLORPERAZINE MALEATE 10 MG PO TABS
10.0000 mg | ORAL_TABLET | Freq: Once | ORAL | Status: AC
  Administered 2022-03-15: 10 mg via ORAL
  Filled 2022-03-15: qty 1

## 2022-03-15 MED ORDER — DEXAMETHASONE 4 MG PO TABS
20.0000 mg | ORAL_TABLET | ORAL | Status: DC
  Administered 2022-03-15: 20 mg via ORAL
  Filled 2022-03-15: qty 5

## 2022-03-15 NOTE — Telephone Encounter (Signed)
Per 03/15/22 los - gave patient upcoming appointments - confirmed

## 2022-03-15 NOTE — Patient Instructions (Signed)
[facility]  Discharge Instructions: Thank you for choosing [facility] to provide your oncology and hematology care.   If you have a lab appointment with the Cancer Center, please go directly to the Cancer Center and check in at the registration area.  Wear comfortable clothing and clothing appropriate for easy access to any Portacath or PICC line.   We strive to give you quality time with your provider. You may need to reschedule your appointment if you arrive late (15 or more minutes).  Arriving late affects you and other patients whose appointments are after yours.  Also, if you miss three or more appointments without notifying the office, you may be dismissed from the clinic at the provider's discretion.      For prescription refill requests, have your pharmacy contact our office and allow 72 hours for refills to be completed.    Today you received the following chemotherapy and/or immunotherapy agents Velcade       To help prevent nausea and vomiting after your treatment, we encourage you to take your nausea medication as directed.  BELOW ARE SYMPTOMS THAT SHOULD BE REPORTED IMMEDIATELY: *FEVER GREATER THAN 100.4 F (38 C) OR HIGHER *CHILLS OR SWEATING *NAUSEA AND VOMITING THAT IS NOT CONTROLLED WITH YOUR NAUSEA MEDICATION *UNUSUAL SHORTNESS OF BREATH *UNUSUAL BRUISING OR BLEEDING *URINARY PROBLEMS (pain or burning when urinating, or frequent urination) *BOWEL PROBLEMS (unusual diarrhea, constipation, pain near the anus) TENDERNESS IN MOUTH AND THROAT WITH OR WITHOUT PRESENCE OF ULCERS (sore throat, sores in mouth, or a toothache) UNUSUAL RASH, SWELLING OR PAIN  UNUSUAL VAGINAL DISCHARGE OR ITCHING   Items with * indicate a potential emergency and should be followed up as soon as possible or go to the Emergency Department if any problems should occur.  Please show the CHEMOTHERAPY ALERT CARD or IMMUNOTHERAPY ALERT CARD at check-in to the  Emergency Department and triage nurse. Should you have questions after your visit or need to cancel or reschedule your appointment, please contact [facility]  [phone] and follow the prompts.  Office hours are 8:00 a.m. to 4:30 p.m. Monday - Friday. Please note that voicemails left after 4:00 p.m. may not be returned until the following business day.  We are closed weekends and major holidays. You have access to a nurse at all times for urgent questions. Please call the main number to the clinic [phone] and follow the prompts.  For any non-urgent questions, you may also contact your provider using MyChart. We now offer e-Visits for anyone 18 and older to request care online for non-urgent symptoms. For details visit [email or web address].   Also download the MyChart app! Go to the app store, search "MyChart", open the app, select [facility], and log in with your MyChart username and password.  Masks are optional in the cancer centers. If you would like for your care team to wear a mask while they are taking care of you, please let them know. For doctor visits, patients may have with them one support person who is at least 86 years old. At this time, visitors are not allowed in the infusion area. 

## 2022-03-16 LAB — KAPPA/LAMBDA LIGHT CHAINS
Kappa free light chain: 193.8 mg/L — ABNORMAL HIGH (ref 3.3–19.4)
Kappa, lambda light chain ratio: 19 — ABNORMAL HIGH (ref 0.26–1.65)
Lambda free light chains: 10.2 mg/L (ref 5.7–26.3)

## 2022-03-16 LAB — PROTEIN ELECTROPHORESIS, SERUM, WITH REFLEX
A/G Ratio: 1.3 (ref 0.7–1.7)
Albumin ELP: 3.3 g/dL (ref 2.9–4.4)
Alpha-1-Globulin: 0.2 g/dL (ref 0.0–0.4)
Alpha-2-Globulin: 0.8 g/dL (ref 0.4–1.0)
Beta Globulin: 0.8 g/dL (ref 0.7–1.3)
Gamma Globulin: 0.7 g/dL (ref 0.4–1.8)
Globulin, Total: 2.5 g/dL (ref 2.2–3.9)
Total Protein ELP: 5.8 g/dL — ABNORMAL LOW (ref 6.0–8.5)

## 2022-03-16 LAB — IGG, IGA, IGM
IgA: 128 mg/dL (ref 61–437)
IgG (Immunoglobin G), Serum: 680 mg/dL (ref 603–1613)
IgM (Immunoglobulin M), Srm: 37 mg/dL (ref 15–143)

## 2022-03-19 ENCOUNTER — Telehealth: Payer: Self-pay

## 2022-03-29 ENCOUNTER — Inpatient Hospital Stay: Payer: Medicare Other | Attending: Family

## 2022-03-29 ENCOUNTER — Inpatient Hospital Stay: Payer: Medicare Other

## 2022-03-29 ENCOUNTER — Other Ambulatory Visit: Payer: Self-pay | Admitting: *Deleted

## 2022-03-29 VITALS — BP 128/60 | HR 70 | Temp 98.4°F | Resp 17

## 2022-03-29 DIAGNOSIS — D631 Anemia in chronic kidney disease: Secondary | ICD-10-CM

## 2022-03-29 DIAGNOSIS — C9000 Multiple myeloma not having achieved remission: Secondary | ICD-10-CM

## 2022-03-29 DIAGNOSIS — Z5111 Encounter for antineoplastic chemotherapy: Secondary | ICD-10-CM | POA: Diagnosis present

## 2022-03-29 DIAGNOSIS — D649 Anemia, unspecified: Secondary | ICD-10-CM | POA: Insufficient documentation

## 2022-03-29 LAB — COMPREHENSIVE METABOLIC PANEL
ALT: 14 U/L (ref 0–44)
AST: 14 U/L — ABNORMAL LOW (ref 15–41)
Albumin: 3.9 g/dL (ref 3.5–5.0)
Alkaline Phosphatase: 57 U/L (ref 38–126)
Anion gap: 5 (ref 5–15)
BUN: 25 mg/dL — ABNORMAL HIGH (ref 8–23)
CO2: 29 mmol/L (ref 22–32)
Calcium: 9.2 mg/dL (ref 8.9–10.3)
Chloride: 102 mmol/L (ref 98–111)
Creatinine, Ser: 2.06 mg/dL — ABNORMAL HIGH (ref 0.61–1.24)
GFR, Estimated: 31 mL/min — ABNORMAL LOW (ref >60)
Glucose, Bld: 150 mg/dL — ABNORMAL HIGH (ref 70–99)
Potassium: 5.1 mmol/L (ref 3.5–5.1)
Sodium: 136 mmol/L (ref 135–145)
Total Bilirubin: 0.4 mg/dL (ref 0.3–1.2)
Total Protein: 5.9 g/dL — ABNORMAL LOW (ref 6.5–8.1)

## 2022-03-29 LAB — CBC WITH DIFFERENTIAL (CANCER CENTER ONLY)
Abs Immature Granulocytes: 0.06 10*3/uL (ref 0.00–0.07)
Basophils Absolute: 0 10*3/uL (ref 0.0–0.1)
Basophils Relative: 1 %
Eosinophils Absolute: 0.3 10*3/uL (ref 0.0–0.5)
Eosinophils Relative: 3 %
HCT: 33 % — ABNORMAL LOW (ref 39.0–52.0)
Hemoglobin: 10.2 g/dL — ABNORMAL LOW (ref 13.0–17.0)
Immature Granulocytes: 1 %
Lymphocytes Relative: 17 %
Lymphs Abs: 1.4 10*3/uL (ref 0.7–4.0)
MCH: 29.4 pg (ref 26.0–34.0)
MCHC: 30.9 g/dL (ref 30.0–36.0)
MCV: 95.1 fL (ref 80.0–100.0)
Monocytes Absolute: 0.9 10*3/uL (ref 0.1–1.0)
Monocytes Relative: 11 %
Neutro Abs: 5.6 10*3/uL (ref 1.7–7.7)
Neutrophils Relative %: 67 %
Platelet Count: 195 10*3/uL (ref 150–400)
RBC: 3.47 MIL/uL — ABNORMAL LOW (ref 4.22–5.81)
RDW: 16.1 % — ABNORMAL HIGH (ref 11.5–15.5)
WBC Count: 8.3 10*3/uL (ref 4.0–10.5)
nRBC: 0 % (ref 0.0–0.2)

## 2022-03-29 MED ORDER — DEXAMETHASONE 4 MG PO TABS
20.0000 mg | ORAL_TABLET | ORAL | Status: DC
  Administered 2022-03-29: 20 mg via ORAL
  Filled 2022-03-29: qty 5

## 2022-03-29 MED ORDER — PROCHLORPERAZINE MALEATE 10 MG PO TABS
10.0000 mg | ORAL_TABLET | Freq: Once | ORAL | Status: AC
  Administered 2022-03-29: 10 mg via ORAL
  Filled 2022-03-29: qty 1

## 2022-03-29 MED ORDER — DENOSUMAB 120 MG/1.7ML ~~LOC~~ SOLN
120.0000 mg | Freq: Once | SUBCUTANEOUS | Status: AC
  Administered 2022-03-29: 120 mg via SUBCUTANEOUS
  Filled 2022-03-29: qty 1.7

## 2022-03-29 MED ORDER — BORTEZOMIB CHEMO SQ INJECTION 3.5 MG (2.5MG/ML)
1.3000 mg/m2 | Freq: Once | INTRAMUSCULAR | Status: AC
  Administered 2022-03-29: 2.25 mg via SUBCUTANEOUS
  Filled 2022-03-29: qty 0.9

## 2022-03-29 NOTE — Progress Notes (Signed)
Okay to treat with SCr 2.06 per Dr. Marin Olp

## 2022-04-12 ENCOUNTER — Inpatient Hospital Stay (HOSPITAL_BASED_OUTPATIENT_CLINIC_OR_DEPARTMENT_OTHER): Payer: Medicare Other | Admitting: Hematology & Oncology

## 2022-04-12 ENCOUNTER — Telehealth: Payer: Self-pay | Admitting: *Deleted

## 2022-04-12 ENCOUNTER — Encounter: Payer: Self-pay | Admitting: Hematology & Oncology

## 2022-04-12 ENCOUNTER — Inpatient Hospital Stay: Payer: Medicare Other

## 2022-04-12 VITALS — BP 138/49 | HR 67 | Temp 98.2°F | Resp 20 | Ht 70.0 in | Wt 141.4 lb

## 2022-04-12 DIAGNOSIS — D508 Other iron deficiency anemias: Secondary | ICD-10-CM | POA: Diagnosis not present

## 2022-04-12 DIAGNOSIS — C9000 Multiple myeloma not having achieved remission: Secondary | ICD-10-CM

## 2022-04-12 DIAGNOSIS — N289 Disorder of kidney and ureter, unspecified: Secondary | ICD-10-CM

## 2022-04-12 DIAGNOSIS — Z5111 Encounter for antineoplastic chemotherapy: Secondary | ICD-10-CM | POA: Diagnosis not present

## 2022-04-12 DIAGNOSIS — D509 Iron deficiency anemia, unspecified: Secondary | ICD-10-CM | POA: Insufficient documentation

## 2022-04-12 HISTORY — DX: Iron deficiency anemia, unspecified: D50.9

## 2022-04-12 LAB — IRON AND IRON BINDING CAPACITY (CC-WL,HP ONLY)
Iron: 36 ug/dL — ABNORMAL LOW (ref 45–182)
Saturation Ratios: 11 % — ABNORMAL LOW (ref 17.9–39.5)
TIBC: 330 ug/dL (ref 250–450)
UIBC: 294 ug/dL (ref 117–376)

## 2022-04-12 LAB — CMP (CANCER CENTER ONLY)
ALT: 14 U/L (ref 0–44)
AST: 14 U/L — ABNORMAL LOW (ref 15–41)
Albumin: 4 g/dL (ref 3.5–5.0)
Alkaline Phosphatase: 63 U/L (ref 38–126)
Anion gap: 7 (ref 5–15)
BUN: 23 mg/dL (ref 8–23)
CO2: 27 mmol/L (ref 22–32)
Calcium: 8.9 mg/dL (ref 8.9–10.3)
Chloride: 103 mmol/L (ref 98–111)
Creatinine: 2 mg/dL — ABNORMAL HIGH (ref 0.61–1.24)
GFR, Estimated: 32 mL/min — ABNORMAL LOW (ref >60)
Glucose, Bld: 126 mg/dL — ABNORMAL HIGH (ref 70–99)
Potassium: 4.2 mmol/L (ref 3.5–5.1)
Sodium: 137 mmol/L (ref 135–145)
Total Bilirubin: 0.5 mg/dL (ref 0.3–1.2)
Total Protein: 6.2 g/dL — ABNORMAL LOW (ref 6.5–8.1)

## 2022-04-12 LAB — CBC WITH DIFFERENTIAL (CANCER CENTER ONLY)
Abs Immature Granulocytes: 0.22 10*3/uL — ABNORMAL HIGH (ref 0.00–0.07)
Basophils Absolute: 0.1 10*3/uL (ref 0.0–0.1)
Basophils Relative: 1 %
Eosinophils Absolute: 0.2 10*3/uL (ref 0.0–0.5)
Eosinophils Relative: 2 %
HCT: 33.7 % — ABNORMAL LOW (ref 39.0–52.0)
Hemoglobin: 10.4 g/dL — ABNORMAL LOW (ref 13.0–17.0)
Immature Granulocytes: 2 %
Lymphocytes Relative: 13 %
Lymphs Abs: 1.4 10*3/uL (ref 0.7–4.0)
MCH: 29 pg (ref 26.0–34.0)
MCHC: 30.9 g/dL (ref 30.0–36.0)
MCV: 93.9 fL (ref 80.0–100.0)
Monocytes Absolute: 1.2 10*3/uL — ABNORMAL HIGH (ref 0.1–1.0)
Monocytes Relative: 11 %
Neutro Abs: 7.8 10*3/uL — ABNORMAL HIGH (ref 1.7–7.7)
Neutrophils Relative %: 71 %
Platelet Count: 230 10*3/uL (ref 150–400)
RBC: 3.59 MIL/uL — ABNORMAL LOW (ref 4.22–5.81)
RDW: 16.1 % — ABNORMAL HIGH (ref 11.5–15.5)
WBC Count: 10.8 10*3/uL — ABNORMAL HIGH (ref 4.0–10.5)
nRBC: 0 % (ref 0.0–0.2)

## 2022-04-12 LAB — LACTATE DEHYDROGENASE: LDH: 187 U/L (ref 98–192)

## 2022-04-12 LAB — RETICULOCYTES
Immature Retic Fract: 18.6 % — ABNORMAL HIGH (ref 2.3–15.9)
RBC.: 3.56 MIL/uL — ABNORMAL LOW (ref 4.22–5.81)
Retic Count, Absolute: 66.2 10*3/uL (ref 19.0–186.0)
Retic Ct Pct: 1.9 % (ref 0.4–3.1)

## 2022-04-12 LAB — FERRITIN: Ferritin: 11 ng/mL — ABNORMAL LOW (ref 24–336)

## 2022-04-12 MED ORDER — PROCHLORPERAZINE MALEATE 10 MG PO TABS
10.0000 mg | ORAL_TABLET | Freq: Once | ORAL | Status: AC
  Administered 2022-04-12: 10 mg via ORAL
  Filled 2022-04-12: qty 1

## 2022-04-12 MED ORDER — DEXAMETHASONE 4 MG PO TABS
20.0000 mg | ORAL_TABLET | ORAL | Status: DC
  Administered 2022-04-12: 20 mg via ORAL
  Filled 2022-04-12: qty 5

## 2022-04-12 MED ORDER — BORTEZOMIB CHEMO SQ INJECTION 3.5 MG (2.5MG/ML)
1.3000 mg/m2 | Freq: Once | INTRAMUSCULAR | Status: AC
  Administered 2022-04-12: 2.25 mg via SUBCUTANEOUS
  Filled 2022-04-12: qty 0.9

## 2022-04-12 NOTE — Telephone Encounter (Signed)
Per 04/12/22 los - gave upcoming appointments - confirmed

## 2022-04-12 NOTE — Addendum Note (Signed)
Addended by: [name] on: 04/12/2022 04:44 PM   Modules accepted: Orders

## 2022-04-12 NOTE — Progress Notes (Signed)
Hematology and Oncology Follow Up Visit  Shawn Bauer [facility] [id number] [birth year] 88 y.o. 04/12/2022   Principle Diagnosis:  IgG Kappa myeloma -- high risk due to t(4:21)   Current Therapy:        Velcade/Decadron -- started 10/09/2019, s/p cycle #`28--treatment frequency changed to every other week on 01/21/2021 Xgeva 120 mg IM q 3 months -- next dose on 06/2022   Interim History: Shawn Bauer is here today for follow-up.  He is doing okay.  He is still busy working outside.  He enjoys doing work.  He says that his back is little bit stiff on him.  He has had no problems with nausea or vomiting.  He still a little bit constipated.  He is taking some stool softeners.  Over last saw him, there was no monoclonal spike in his blood.  His Kappa light chain was actually little bit better at 19.8 mg/dL.  He has had no numbness or tingling in the hands or feet.  He has had no rashes.  There is been no cough or shortness of breath.  He has had no mouth sores.  He has had no headache.  Overall, I would say his performance status is probably ECOG 1.     Medications:  Allergies as of 04/12/2022   No Known Allergies      Medication List        Accurate as of April 12, 2022 10:39 AM. If you have any questions, ask your nurse or doctor.          amiodarone 200 MG tablet Commonly known as: PACERONE Take 1 tablet (200 mg total) by mouth as directed. Take '200mg'$  daily Monday - Friday only.   aspirin EC 81 MG tablet Take 81 mg by mouth daily. Swallow whole.   atorvastatin 40 MG tablet Commonly known as: LIPITOR TAKE 1 TABLET BY MOUTH DAILY   CINNAMON PO Take 1,000 mg by mouth daily.   clopidogrel 75 MG tablet Commonly known as: PLAVIX TAKE 1 TABLET BY MOUTH DAILY WITH BREAKFAST   cyanocobalamin 1000 MCG tablet Take 1,000 mcg by mouth daily.   melatonin 5 MG Tabs Take 15 mg by mouth as needed.   metoprolol succinate 25 MG 24 hr tablet Commonly known as: TOPROL-XL Take 25 mg by  mouth daily.   nitroGLYCERIN 0.4 MG SL tablet Commonly known as: NITROSTAT Place 1 tablet (0.4 mg total) under the tongue every 5 (five) minutes as needed for chest pain.   olmesartan 20 MG tablet Commonly known as: BENICAR Take 20 mg by mouth in the morning.   ondansetron 8 MG tablet Commonly known as: ZOFRAN Take 1 tablet (8 mg total) by mouth every 8 (eight) hours as needed for nausea or vomiting.   Symbicort 160-4.5 MCG/ACT inhaler Generic drug: budesonide-formoterol Inhale 2 puffs into the lungs 2 (two) times daily.   traMADol 50 MG tablet Commonly known as: ULTRAM Take 50 mg by mouth 2 (two) times daily as needed (for pain).        Allergies: No Known Allergies  Past Medical History, Surgical history, Social history, and Family History were reviewed and updated.  Review of Systems: Review of Systems  Constitutional: Negative.   HENT: Negative.    Eyes: Negative.   Respiratory: Negative.    Cardiovascular:  Positive for palpitations.  Gastrointestinal: Negative.   Genitourinary: Negative.   Musculoskeletal: Negative.   Skin: Negative.   Neurological: Negative.   Endo/Heme/Allergies: Negative.   Psychiatric/Behavioral: Negative.  Physical Exam:  height is '5\' 10"'$  (1.778 m) and weight is 141 lb 6.4 oz (64.1 kg). His oral temperature is 98.2 F (36.8 C). His blood pressure is 138/49 (abnormal) and his pulse is 67. His respiration is 20 and oxygen saturation is 98%.   Wt Readings from Last 3 Encounters:  04/12/22 141 lb 6.4 oz (64.1 kg)  03/15/22 140 lb 1.9 oz (63.6 kg)  02/15/22 141 lb (64 kg)    Physical Exam Vitals reviewed.  HENT:     Head: Normocephalic and atraumatic.  Eyes:     Pupils: Pupils are equal, round, and reactive to light.  Cardiovascular:     Rate and Rhythm: Normal rate and regular rhythm.     Heart sounds: Normal heart sounds.  Pulmonary:     Effort: Pulmonary effort is normal.     Breath sounds: Normal breath sounds.   Abdominal:     General: Bowel sounds are normal.     Palpations: Abdomen is soft.  Musculoskeletal:        General: No tenderness or deformity. Normal range of motion.     Cervical back: Normal range of motion.  Lymphadenopathy:     Cervical: No cervical adenopathy.  Skin:    General: Skin is warm and dry.     Findings: No erythema or rash.  Neurological:     Mental Status: He is alert and oriented to person, place, and time.  Psychiatric:        Behavior: Behavior normal.        Thought Content: Thought content normal.        Judgment: Judgment normal.      Lab Results  Component Value Date   WBC 10.8 (H) 04/12/2022   HGB 10.4 (L) 04/12/2022   HCT 33.7 (L) 04/12/2022   MCV 93.9 04/12/2022   PLT 230 04/12/2022   No results found for: "FERRITIN", "IRON", "TIBC", "UIBC", "IRONPCTSAT" Lab Results  Component Value Date   RETICCTPCT 1.9 04/12/2022   RBC 3.56 (L) 04/12/2022   Lab Results  Component Value Date   KPAFRELGTCHN 193.8 (H) 03/15/2022   LAMBDASER 10.2 03/15/2022   KAPLAMBRATIO 19.00 (H) 03/15/2022   Lab Results  Component Value Date   IGGSERUM 680 03/15/2022   IGA 128 03/15/2022   IGMSERUM 37 03/15/2022   Lab Results  Component Value Date   TOTALPROTELP 5.8 (L) 03/15/2022   ALBUMINELP 3.3 03/15/2022   A1GS 0.2 03/15/2022   A2GS 0.8 03/15/2022   BETS 0.8 03/15/2022   GAMS 0.7 03/15/2022   MSPIKE Not Observed 03/15/2022   SPEI Comment 04/03/2021     Chemistry      Component Value Date/Time   NA 137 04/12/2022 0859   K 4.2 04/12/2022 0859   CL 103 04/12/2022 0859   CO2 27 04/12/2022 0859   BUN 23 04/12/2022 0859   CREATININE 2.00 (H) 04/12/2022 0859      Component Value Date/Time   CALCIUM 8.9 04/12/2022 0859   ALKPHOS 63 04/12/2022 0859   AST 14 (L) 04/12/2022 0859   ALT 14 04/12/2022 0859   BILITOT 0.5 04/12/2022 0859       Impression and Plan: Shawn Bauer is a very pleasant 88 yo caucasian gentleman with IgG kappa myeloma.   Thankfully, his last myeloma studies looked a little better.  He has mostly light chain that we are following.  We will have to see what this is.  His renal function is holding steady.  His anemia is mild  but asymptomatic.  We will go ahead with treatment today.  We will plan to get him back in 2 weeks for his next cycle. Mikey Kirschner, MD 7/20/202310:39 AM

## 2022-04-12 NOTE — Progress Notes (Signed)
Ok to treat with creatinine of 2.0 per Dr Marin Olp. dph

## 2022-04-13 ENCOUNTER — Other Ambulatory Visit: Payer: Self-pay | Admitting: Pharmacist

## 2022-04-13 ENCOUNTER — Telehealth: Payer: Self-pay | Admitting: *Deleted

## 2022-04-13 LAB — IGG, IGA, IGM
IgA: 119 mg/dL (ref 61–437)
IgG (Immunoglobin G), Serum: 652 mg/dL (ref 603–1613)
IgM (Immunoglobulin M), Srm: 35 mg/dL (ref 15–143)

## 2022-04-13 LAB — KAPPA/LAMBDA LIGHT CHAINS
Kappa free light chain: 181.9 mg/L — ABNORMAL HIGH (ref 3.3–19.4)
Kappa, lambda light chain ratio: 18.37 — ABNORMAL HIGH (ref 0.26–1.65)
Lambda free light chains: 9.9 mg/L (ref 5.7–26.3)

## 2022-04-13 NOTE — Telephone Encounter (Signed)
-----   Message from Shawn Napoleon, MD sent at 04/12/2022  4:34 PM EDT ----- Call and let him know that the iron level is actually low.  This is a problem with his anemia.  We need to give him a dose of IV iron.  I would like to do this next week.

## 2022-04-13 NOTE — Telephone Encounter (Signed)
Pt notified per order of Dr. Marin Olp that "the iron level is actually low.  This is a problem with his anemia.  We need to give him a dose of IV iron.  I would like to do this next week."  Pt is appreciative of call and has no questions or concerns at this time.  Message sent to scheduling.

## 2022-04-16 ENCOUNTER — Other Ambulatory Visit: Payer: Self-pay

## 2022-04-16 LAB — PROTEIN ELECTROPHORESIS, SERUM, WITH REFLEX
A/G Ratio: 1.2 (ref 0.7–1.7)
Albumin ELP: 3.2 g/dL (ref 2.9–4.4)
Alpha-1-Globulin: 0.3 g/dL (ref 0.0–0.4)
Alpha-2-Globulin: 0.9 g/dL (ref 0.4–1.0)
Beta Globulin: 0.8 g/dL (ref 0.7–1.3)
Gamma Globulin: 0.6 g/dL (ref 0.4–1.8)
Globulin, Total: 2.6 g/dL (ref 2.2–3.9)
Total Protein ELP: 5.8 g/dL — ABNORMAL LOW (ref 6.0–8.5)

## 2022-04-19 ENCOUNTER — Inpatient Hospital Stay: Payer: Medicare Other

## 2022-04-19 VITALS — BP 136/55 | HR 66 | Temp 98.0°F | Resp 20

## 2022-04-19 DIAGNOSIS — C9000 Multiple myeloma not having achieved remission: Secondary | ICD-10-CM

## 2022-04-19 DIAGNOSIS — Z5111 Encounter for antineoplastic chemotherapy: Secondary | ICD-10-CM | POA: Diagnosis not present

## 2022-04-19 DIAGNOSIS — N183 Chronic kidney disease, stage 3 unspecified: Secondary | ICD-10-CM

## 2022-04-19 MED ORDER — SODIUM CHLORIDE 0.9 % IV SOLN
300.0000 mg | Freq: Once | INTRAVENOUS | Status: AC
  Administered 2022-04-19: 300 mg via INTRAVENOUS
  Filled 2022-04-19: qty 300

## 2022-04-19 MED ORDER — SODIUM CHLORIDE 0.9 % IV SOLN: Freq: Once | INTRAVENOUS | Status: AC

## 2022-04-19 NOTE — Progress Notes (Signed)
Patient refused to wait 30 minutes post infusion. Released stable and ASX. 

## 2022-04-19 NOTE — Patient Instructions (Signed)

## 2022-04-22 IMAGING — DX DG CHEST 1V PORT
1 series · 1 of 1 positions shown · non-contrast
Comparison: 01/21/2009

CLINICAL DATA: Chest pain

EXAM:
PORTABLE CHEST 1 VIEW

[chest ap]
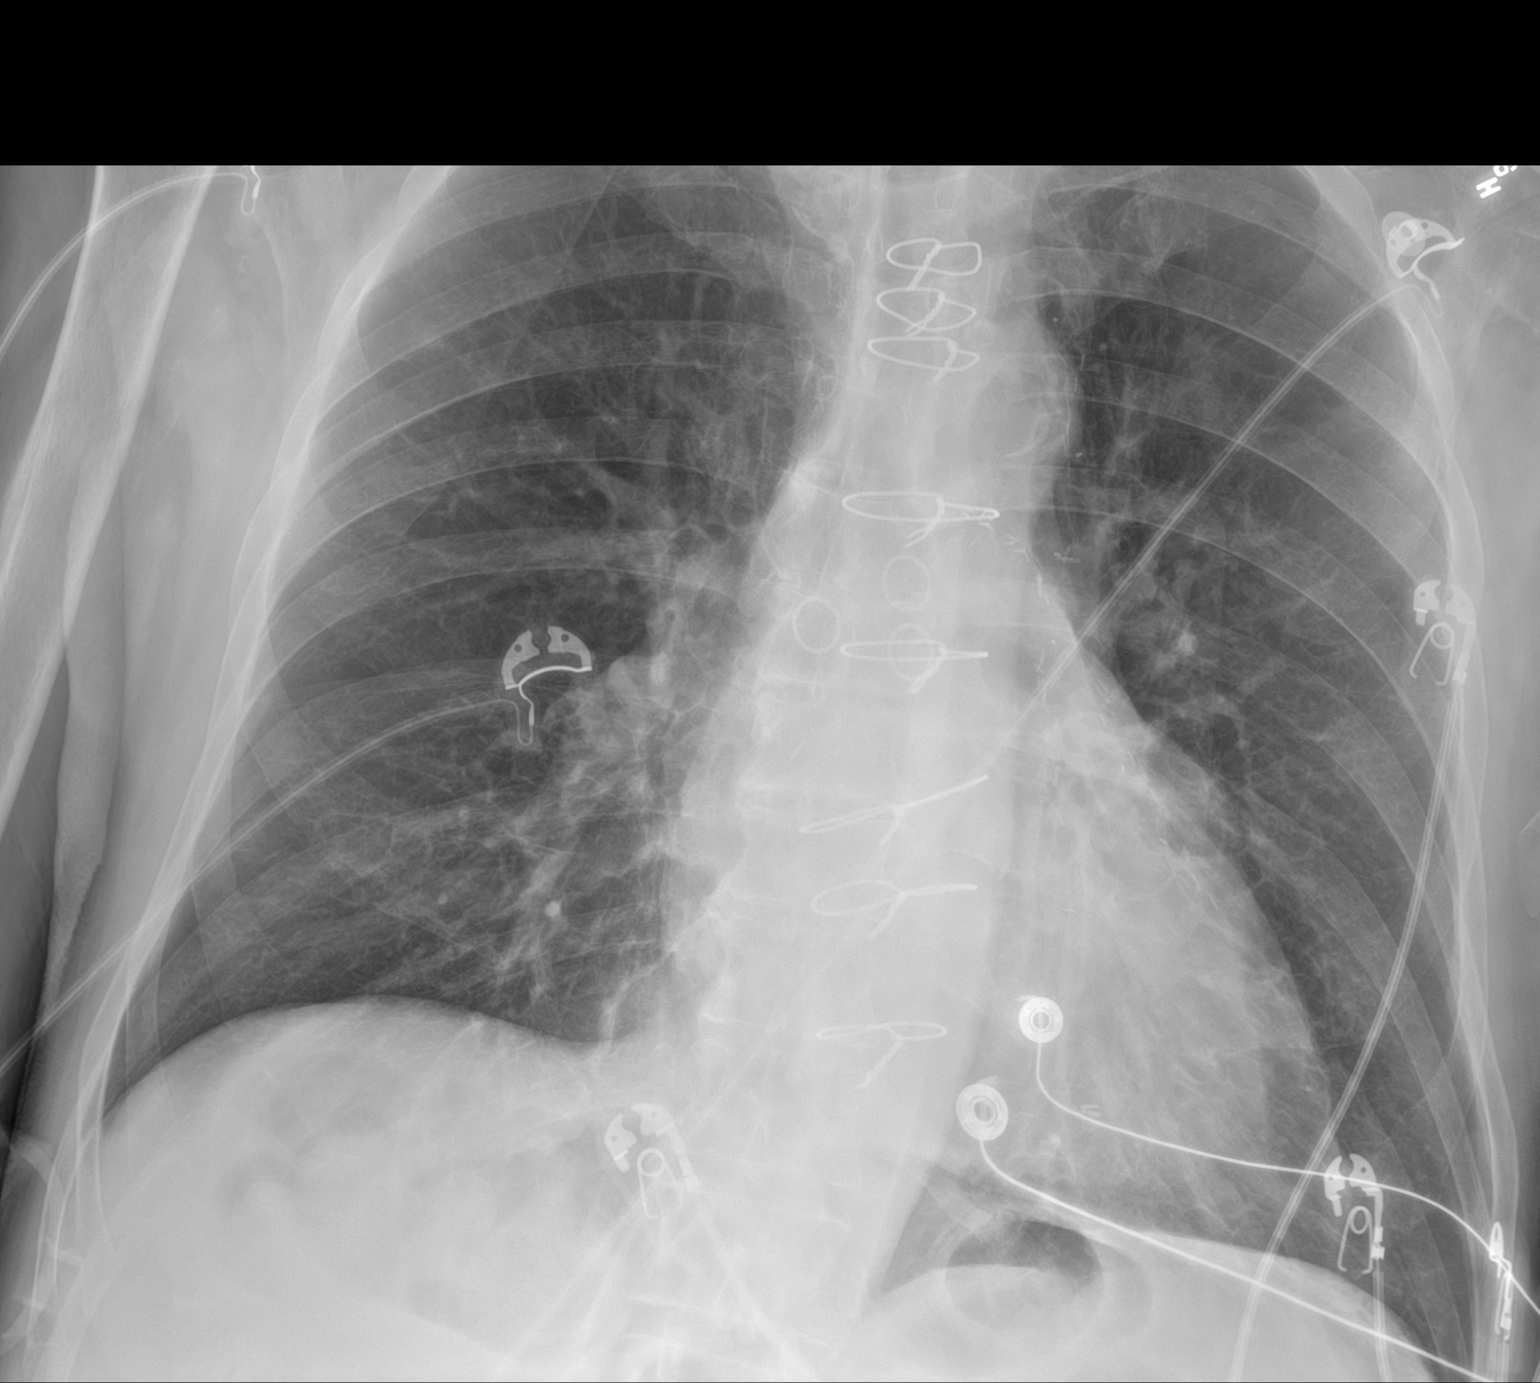

[1 of 1 positions shown; findings below may reference images not displayed]

FINDINGS: Post CABG changes. Heart size is upper limits of normal.
Atherosclerotic calcification of the aortic knob. No focal airspace
consolidation, pleural effusion, or pneumothorax.
IMPRESSION: No active disease.

## 2022-04-24 ENCOUNTER — Other Ambulatory Visit: Payer: Self-pay

## 2022-04-26 ENCOUNTER — Inpatient Hospital Stay: Payer: Medicare Other

## 2022-04-26 ENCOUNTER — Inpatient Hospital Stay: Payer: Medicare Other | Attending: Family

## 2022-04-26 VITALS — BP 132/61 | HR 68 | Temp 98.4°F | Resp 18

## 2022-04-26 DIAGNOSIS — Z5111 Encounter for antineoplastic chemotherapy: Secondary | ICD-10-CM | POA: Insufficient documentation

## 2022-04-26 DIAGNOSIS — C9000 Multiple myeloma not having achieved remission: Secondary | ICD-10-CM

## 2022-04-26 LAB — CBC WITH DIFFERENTIAL (CANCER CENTER ONLY)
Abs Immature Granulocytes: 0.11 10*3/uL — ABNORMAL HIGH (ref 0.00–0.07)
Basophils Absolute: 0.1 10*3/uL (ref 0.0–0.1)
Basophils Relative: 1 %
Eosinophils Absolute: 0.2 10*3/uL (ref 0.0–0.5)
Eosinophils Relative: 2 %
HCT: 33.2 % — ABNORMAL LOW (ref 39.0–52.0)
Hemoglobin: 10.4 g/dL — ABNORMAL LOW (ref 13.0–17.0)
Immature Granulocytes: 1 %
Lymphocytes Relative: 18 %
Lymphs Abs: 1.9 10*3/uL (ref 0.7–4.0)
MCH: 29.6 pg (ref 26.0–34.0)
MCHC: 31.3 g/dL (ref 30.0–36.0)
MCV: 94.6 fL (ref 80.0–100.0)
Monocytes Absolute: 1.2 10*3/uL — ABNORMAL HIGH (ref 0.1–1.0)
Monocytes Relative: 11 %
Neutro Abs: 7 10*3/uL (ref 1.7–7.7)
Neutrophils Relative %: 67 %
Platelet Count: 213 10*3/uL (ref 150–400)
RBC: 3.51 MIL/uL — ABNORMAL LOW (ref 4.22–5.81)
RDW: 16.9 % — ABNORMAL HIGH (ref 11.5–15.5)
WBC Count: 10.5 10*3/uL (ref 4.0–10.5)
nRBC: 0 % (ref 0.0–0.2)

## 2022-04-26 LAB — CMP (CANCER CENTER ONLY)
ALT: 14 U/L (ref 0–44)
AST: 15 U/L (ref 15–41)
Albumin: 3.8 g/dL (ref 3.5–5.0)
Alkaline Phosphatase: 55 U/L (ref 38–126)
Anion gap: 6 (ref 5–15)
BUN: 23 mg/dL (ref 8–23)
CO2: 30 mmol/L (ref 22–32)
Calcium: 9.7 mg/dL (ref 8.9–10.3)
Chloride: 103 mmol/L (ref 98–111)
Creatinine: 1.97 mg/dL — ABNORMAL HIGH (ref 0.61–1.24)
GFR, Estimated: 33 mL/min — ABNORMAL LOW (ref >60)
Glucose, Bld: 115 mg/dL — ABNORMAL HIGH (ref 70–99)
Potassium: 5.1 mmol/L (ref 3.5–5.1)
Sodium: 139 mmol/L (ref 135–145)
Total Bilirubin: 0.4 mg/dL (ref 0.3–1.2)
Total Protein: 5.8 g/dL — ABNORMAL LOW (ref 6.5–8.1)

## 2022-04-26 MED ORDER — BORTEZOMIB CHEMO SQ INJECTION 3.5 MG (2.5MG/ML)
1.3000 mg/m2 | Freq: Once | INTRAMUSCULAR | Status: AC
  Administered 2022-04-26: 2.25 mg via SUBCUTANEOUS
  Filled 2022-04-26: qty 0.9

## 2022-04-26 MED ORDER — PROCHLORPERAZINE MALEATE 10 MG PO TABS
10.0000 mg | ORAL_TABLET | Freq: Once | ORAL | Status: AC
  Administered 2022-04-26: 10 mg via ORAL
  Filled 2022-04-26: qty 1

## 2022-04-26 MED ORDER — DEXAMETHASONE 4 MG PO TABS
20.0000 mg | ORAL_TABLET | ORAL | Status: DC
  Administered 2022-04-26: 20 mg via ORAL
  Filled 2022-04-26: qty 5

## 2022-04-26 NOTE — Patient Instructions (Signed)
[facility]  Discharge Instructions: Thank you for choosing [facility] to provide your oncology and hematology care.   If you have a lab appointment with the Cancer Center, please go directly to the Cancer Center and check in at the registration area.  Wear comfortable clothing and clothing appropriate for easy access to any Portacath or PICC line.   We strive to give you quality time with your provider. You may need to reschedule your appointment if you arrive late (15 or more minutes).  Arriving late affects you and other patients whose appointments are after yours.  Also, if you miss three or more appointments without notifying the office, you may be dismissed from the clinic at the provider's discretion.      For prescription refill requests, have your pharmacy contact our office and allow 72 hours for refills to be completed.    Today you received the following chemotherapy and/or immunotherapy agents Velcade      To help prevent nausea and vomiting after your treatment, we encourage you to take your nausea medication as directed.  BELOW ARE SYMPTOMS THAT SHOULD BE REPORTED IMMEDIATELY: *FEVER GREATER THAN 100.4 F (38 C) OR HIGHER *CHILLS OR SWEATING *NAUSEA AND VOMITING THAT IS NOT CONTROLLED WITH YOUR NAUSEA MEDICATION *UNUSUAL SHORTNESS OF BREATH *UNUSUAL BRUISING OR BLEEDING *URINARY PROBLEMS (pain or burning when urinating, or frequent urination) *BOWEL PROBLEMS (unusual diarrhea, constipation, pain near the anus) TENDERNESS IN MOUTH AND THROAT WITH OR WITHOUT PRESENCE OF ULCERS (sore throat, sores in mouth, or a toothache) UNUSUAL RASH, SWELLING OR PAIN  UNUSUAL VAGINAL DISCHARGE OR ITCHING   Items with * indicate a potential emergency and should be followed up as soon as possible or go to the Emergency Department if any problems should occur.  Please show the CHEMOTHERAPY ALERT CARD or IMMUNOTHERAPY ALERT CARD at check-in to the  Emergency Department and triage nurse. Should you have questions after your visit or need to cancel or reschedule your appointment, please contact [facility]  [phone] and follow the prompts.  Office hours are 8:00 a.m. to 4:30 p.m. Monday - Friday. Please note that voicemails left after 4:00 p.m. may not be returned until the following business day.  We are closed weekends and major holidays. You have access to a nurse at all times for urgent questions. Please call the main number to the clinic [phone] and follow the prompts.  For any non-urgent questions, you may also contact your provider using MyChart. We now offer e-Visits for anyone 18 and older to request care online for non-urgent symptoms. For details visit [email or web address].   Also download the MyChart app! Go to the app store, search "MyChart", open the app, select [facility], and log in with your MyChart username and password.  Masks are optional in the cancer centers. If you would like for your care team to wear a mask while they are taking care of you, please let them know. You may have one support person who is at least 86 years old accompany you for your appointments. 

## 2022-05-10 ENCOUNTER — Inpatient Hospital Stay: Payer: Medicare Other

## 2022-05-10 VITALS — BP 116/49 | HR 66 | Temp 98.5°F | Resp 18

## 2022-05-10 DIAGNOSIS — Z5111 Encounter for antineoplastic chemotherapy: Secondary | ICD-10-CM | POA: Diagnosis not present

## 2022-05-10 DIAGNOSIS — C9000 Multiple myeloma not having achieved remission: Secondary | ICD-10-CM

## 2022-05-10 LAB — CMP (CANCER CENTER ONLY)
ALT: 12 U/L (ref 0–44)
AST: 12 U/L — ABNORMAL LOW (ref 15–41)
Albumin: 3.7 g/dL (ref 3.5–5.0)
Alkaline Phosphatase: 54 U/L (ref 38–126)
Anion gap: 6 (ref 5–15)
BUN: 21 mg/dL (ref 8–23)
CO2: 28 mmol/L (ref 22–32)
Calcium: 9.1 mg/dL (ref 8.9–10.3)
Chloride: 103 mmol/L (ref 98–111)
Creatinine: 1.92 mg/dL — ABNORMAL HIGH (ref 0.61–1.24)
GFR, Estimated: 34 mL/min — ABNORMAL LOW (ref >60)
Glucose, Bld: 125 mg/dL — ABNORMAL HIGH (ref 70–99)
Potassium: 4.3 mmol/L (ref 3.5–5.1)
Sodium: 137 mmol/L (ref 135–145)
Total Bilirubin: 0.6 mg/dL (ref 0.3–1.2)
Total Protein: 6.3 g/dL — ABNORMAL LOW (ref 6.5–8.1)

## 2022-05-10 LAB — CBC WITH DIFFERENTIAL (CANCER CENTER ONLY)
Abs Immature Granulocytes: 0.05 10*3/uL (ref 0.00–0.07)
Basophils Absolute: 0.1 10*3/uL (ref 0.0–0.1)
Basophils Relative: 1 %
Eosinophils Absolute: 0.1 10*3/uL (ref 0.0–0.5)
Eosinophils Relative: 2 %
HCT: 35.9 % — ABNORMAL LOW (ref 39.0–52.0)
Hemoglobin: 11.3 g/dL — ABNORMAL LOW (ref 13.0–17.0)
Immature Granulocytes: 1 %
Lymphocytes Relative: 16 %
Lymphs Abs: 1.4 10*3/uL (ref 0.7–4.0)
MCH: 30.1 pg (ref 26.0–34.0)
MCHC: 31.5 g/dL (ref 30.0–36.0)
MCV: 95.5 fL (ref 80.0–100.0)
Monocytes Absolute: 0.9 10*3/uL (ref 0.1–1.0)
Monocytes Relative: 10 %
Neutro Abs: 6.3 10*3/uL (ref 1.7–7.7)
Neutrophils Relative %: 70 %
Platelet Count: 171 10*3/uL (ref 150–400)
RBC: 3.76 MIL/uL — ABNORMAL LOW (ref 4.22–5.81)
RDW: 17.4 % — ABNORMAL HIGH (ref 11.5–15.5)
WBC Count: 8.9 10*3/uL (ref 4.0–10.5)
nRBC: 0 % (ref 0.0–0.2)

## 2022-05-10 MED ORDER — DEXAMETHASONE 4 MG PO TABS
20.0000 mg | ORAL_TABLET | ORAL | Status: DC
  Administered 2022-05-10: 20 mg via ORAL
  Filled 2022-05-10: qty 5

## 2022-05-10 MED ORDER — PROCHLORPERAZINE MALEATE 10 MG PO TABS
10.0000 mg | ORAL_TABLET | Freq: Once | ORAL | Status: AC
  Administered 2022-05-10: 10 mg via ORAL
  Filled 2022-05-10: qty 1

## 2022-05-10 MED ORDER — BORTEZOMIB CHEMO SQ INJECTION 3.5 MG (2.5MG/ML)
1.3000 mg/m2 | Freq: Once | INTRAMUSCULAR | Status: AC
  Administered 2022-05-10: 2.25 mg via SUBCUTANEOUS
  Filled 2022-05-10: qty 0.9

## 2022-05-10 NOTE — Patient Instructions (Signed)
Bortezomib Injection What is this medication? BORTEZOMIB (bor TEZ oh mib) treats lymphoma. It may also be used to treat multiple myeloma, a type of bone marrow cancer. It works by blocking a protein that causes cancer cells to grow and multiply. This helps to slow or stop the spread of cancer cells. This medicine may be used for other purposes; ask your health care provider or pharmacist if you have questions. COMMON BRAND NAME(S): Velcade What should I tell my care team before I take this medication? They need to know if you have any of these conditions: Dehydration Diabetes Heart disease Liver disease Tingling of the fingers or toes or other nerve disorder An unusual or allergic reaction to bortezomib, other medications, foods, dyes, or preservatives If you or your partner are pregnant or trying to get pregnant Breastfeeding How should I use this medication? This medication is injected into a vein or under the skin. It is given by your care team in a hospital or clinic setting. Talk to your care team about the use of this medication in children. Special care may be needed. Overdosage: If you think you have taken too much of this medicine contact a poison control center or emergency room at once. NOTE: This medicine is only for you. Do not share this medicine with others. What if I miss a dose? Keep appointments for follow-up doses. It is important not to miss your dose. Call your care team if you are unable to keep an appointment. What may interact with this medication? Ketoconazole Rifampin This list may not describe all possible interactions. Give your health care provider a list of all the medicines, herbs, non-prescription drugs, or dietary supplements you use. Also tell them if you smoke, drink alcohol, or use illegal drugs. Some items may interact with your medicine. What should I watch for while using this medication? Your condition will be monitored carefully while you are  receiving this medication. You may need blood work while taking this medication. This medication may affect your coordination, reaction time, or judgment. Do not drive or operate machinery until you know how this medication affects you. Sit up or stand slowly to reduce the risk of dizzy or fainting spells. Drinking alcohol with this medication can increase the risk of these side effects. This medication may increase your risk of getting an infection. Call your care team for advice if you get a fever, chills, sore throat, or other symptoms of a cold or flu. Do not treat yourself. Try to avoid being around people who are sick. Check with your care team if you have severe diarrhea, nausea, and vomiting, or if you sweat a lot. The loss of too much body fluid may make it dangerous for you to take this medication. Talk to your care team if you may be pregnant. Serious birth defects can occur if you take this medication during pregnancy and for 7 months after the last dose. You will need a negative pregnancy test before starting this medication. Contraception is recommended while taking this medication and for 7 months after the last dose. Your care team can help you find the option that works for you. If your partner can get pregnant, use a condom during sex while taking this medication and for 4 months after the last dose. Do not breastfeed while taking this medication and for 2 months after the last dose. This medication may cause infertility. Talk to your care team if you are concerned about your fertility. What side effects   may I notice from receiving this medication? Side effects that you should report to your care team as soon as possible: Allergic reactions--skin rash, itching, hives, swelling of the face, lips, tongue, or throat Bleeding--bloody or black, tar-like stools, vomiting blood or brown material that looks like coffee grounds, red or dark brown urine, small red or purple spots on skin, unusual  bruising or bleeding Bleeding in the brain--severe headache, stiff neck, confusion, dizziness, change in vision, numbness or weakness of the face, arm, or leg, trouble speaking, trouble walking, vomiting Bowel blockage--stomach cramping, unable to have a bowel movement or pass gas, loss of appetite, vomiting Heart failure--shortness of breath, swelling of the ankles, feet, or hands, sudden weight gain, unusual weakness or fatigue Infection--fever, chills, cough, sore throat, wounds that don't heal, pain or trouble when passing urine, general feeling of discomfort or being unwell Liver injury--right upper belly pain, loss of appetite, nausea, light-colored stool, dark yellow or brown urine, yellowing skin or eyes, unusual weakness or fatigue Low blood pressure--dizziness, feeling faint or lightheaded, blurry vision Lung injury--shortness of breath or trouble breathing, cough, spitting up blood, chest pain, fever Pain, tingling, or numbness in the hands or feet Severe or prolonged diarrhea Stomach pain, bloody diarrhea, pale skin, unusual weakness or fatigue, decrease in the amount of urine, which may be signs of hemolytic uremic syndrome Sudden and severe headache, confusion, change in vision, seizures, which may be signs of posterior reversible encephalopathy syndrome (PRES) TTP--purple spots on the skin or inside the mouth, pale skin, yellowing skin or eyes, unusual weakness or fatigue, fever, fast or irregular heartbeat, confusion, change in vision, trouble speaking, trouble walking Tumor lysis syndrome (TLS)--nausea, vomiting, diarrhea, decrease in the amount of urine, dark urine, unusual weakness or fatigue, confusion, muscle pain or cramps, fast or irregular heartbeat, joint pain Side effects that usually do not require medical attention (report to your care team if they continue or are bothersome): Constipation Diarrhea Fatigue Loss of appetite Nausea This list may not describe all possible  side effects. Call your doctor for medical advice about side effects. You may report side effects to FDA at 1-800-FDA-1088. Where should I keep my medication? This medication is given in a hospital or clinic. It will not be stored at home. NOTE: This sheet is a summary. It may not cover all possible information. If you have questions about this medicine, talk to your doctor, pharmacist, or health care provider.  2023 Elsevier/Gold Standard (2022-02-07 00:00:00)  

## 2022-05-16 ENCOUNTER — Other Ambulatory Visit: Payer: Self-pay | Admitting: Cardiovascular Disease

## 2022-05-24 ENCOUNTER — Inpatient Hospital Stay: Payer: Medicare Other

## 2022-05-24 ENCOUNTER — Encounter: Payer: Self-pay | Admitting: Hematology & Oncology

## 2022-05-24 ENCOUNTER — Inpatient Hospital Stay (HOSPITAL_BASED_OUTPATIENT_CLINIC_OR_DEPARTMENT_OTHER): Payer: Medicare Other | Admitting: Hematology & Oncology

## 2022-05-24 VITALS — BP 170/67

## 2022-05-24 VITALS — HR 72 | Temp 98.4°F | Resp 20 | Ht 70.0 in | Wt 138.0 lb

## 2022-05-24 DIAGNOSIS — Z5111 Encounter for antineoplastic chemotherapy: Secondary | ICD-10-CM | POA: Diagnosis not present

## 2022-05-24 DIAGNOSIS — C9000 Multiple myeloma not having achieved remission: Secondary | ICD-10-CM

## 2022-05-24 LAB — CMP (CANCER CENTER ONLY)
ALT: 11 U/L (ref 0–44)
AST: 13 U/L — ABNORMAL LOW (ref 15–41)
Albumin: 3.7 g/dL (ref 3.5–5.0)
Alkaline Phosphatase: 52 U/L (ref 38–126)
Anion gap: 6 (ref 5–15)
BUN: 24 mg/dL — ABNORMAL HIGH (ref 8–23)
CO2: 30 mmol/L (ref 22–32)
Calcium: 9.3 mg/dL (ref 8.9–10.3)
Chloride: 103 mmol/L (ref 98–111)
Creatinine: 1.91 mg/dL — ABNORMAL HIGH (ref 0.61–1.24)
GFR, Estimated: 34 mL/min — ABNORMAL LOW
Glucose, Bld: 135 mg/dL — ABNORMAL HIGH (ref 70–99)
Potassium: 4.5 mmol/L (ref 3.5–5.1)
Sodium: 139 mmol/L (ref 135–145)
Total Bilirubin: 0.6 mg/dL (ref 0.3–1.2)
Total Protein: 6.2 g/dL — ABNORMAL LOW (ref 6.5–8.1)

## 2022-05-24 LAB — CBC WITH DIFFERENTIAL (CANCER CENTER ONLY)
Abs Immature Granulocytes: 0.11 K/uL — ABNORMAL HIGH (ref 0.00–0.07)
Basophils Absolute: 0.1 K/uL (ref 0.0–0.1)
Basophils Relative: 1 %
Eosinophils Absolute: 0.1 K/uL (ref 0.0–0.5)
Eosinophils Relative: 2 %
HCT: 36.2 % — ABNORMAL LOW (ref 39.0–52.0)
Hemoglobin: 11.2 g/dL — ABNORMAL LOW (ref 13.0–17.0)
Immature Granulocytes: 2 %
Lymphocytes Relative: 15 %
Lymphs Abs: 1.1 K/uL (ref 0.7–4.0)
MCH: 30.2 pg (ref 26.0–34.0)
MCHC: 30.9 g/dL (ref 30.0–36.0)
MCV: 97.6 fL (ref 80.0–100.0)
Monocytes Absolute: 0.7 K/uL (ref 0.1–1.0)
Monocytes Relative: 9 %
Neutro Abs: 5.1 K/uL (ref 1.7–7.7)
Neutrophils Relative %: 71 %
Platelet Count: 180 K/uL (ref 150–400)
RBC: 3.71 MIL/uL — ABNORMAL LOW (ref 4.22–5.81)
RDW: 16.6 % — ABNORMAL HIGH (ref 11.5–15.5)
WBC Count: 7.1 K/uL (ref 4.0–10.5)
nRBC: 0 % (ref 0.0–0.2)

## 2022-05-24 LAB — LACTATE DEHYDROGENASE: LDH: 178 U/L (ref 98–192)

## 2022-05-24 MED ORDER — DEXAMETHASONE 4 MG PO TABS
20.0000 mg | ORAL_TABLET | ORAL | Status: DC
  Administered 2022-05-24: 20 mg via ORAL
  Filled 2022-05-24: qty 5

## 2022-05-24 MED ORDER — BORTEZOMIB CHEMO SQ INJECTION 3.5 MG (2.5MG/ML)
1.3000 mg/m2 | Freq: Once | INTRAMUSCULAR | Status: AC
  Administered 2022-05-24: 2.25 mg via SUBCUTANEOUS
  Filled 2022-05-24: qty 0.9

## 2022-05-24 MED ORDER — PROCHLORPERAZINE MALEATE 10 MG PO TABS
10.0000 mg | ORAL_TABLET | Freq: Once | ORAL | Status: AC
  Administered 2022-05-24: 10 mg via ORAL
  Filled 2022-05-24: qty 1

## 2022-05-24 NOTE — Progress Notes (Signed)
Reviewed pt labs with Dr. Marin Olp and pt ok to treat with creatinine 1.9  Pt left prior to discharge vitals. Pt left ambulatory in no apparent distress.

## 2022-05-24 NOTE — Patient Instructions (Signed)
[facility]  Discharge Instructions: Thank you for choosing [facility] to provide your oncology and hematology care.   If you have a lab appointment with the Cancer Center, please go directly to the Cancer Center and check in at the registration area.  Wear comfortable clothing and clothing appropriate for easy access to any Portacath or PICC line.   We strive to give you quality time with your provider. You may need to reschedule your appointment if you arrive late (15 or more minutes).  Arriving late affects you and other patients whose appointments are after yours.  Also, if you miss three or more appointments without notifying the office, you may be dismissed from the clinic at the provider's discretion.      For prescription refill requests, have your pharmacy contact our office and allow 72 hours for refills to be completed.    Today you received the following chemotherapy and/or immunotherapy agents Velcade      To help prevent nausea and vomiting after your treatment, we encourage you to take your nausea medication as directed.  BELOW ARE SYMPTOMS THAT SHOULD BE REPORTED IMMEDIATELY: *FEVER GREATER THAN 100.4 F (38 C) OR HIGHER *CHILLS OR SWEATING *NAUSEA AND VOMITING THAT IS NOT CONTROLLED WITH YOUR NAUSEA MEDICATION *UNUSUAL SHORTNESS OF BREATH *UNUSUAL BRUISING OR BLEEDING *URINARY PROBLEMS (pain or burning when urinating, or frequent urination) *BOWEL PROBLEMS (unusual diarrhea, constipation, pain near the anus) TENDERNESS IN MOUTH AND THROAT WITH OR WITHOUT PRESENCE OF ULCERS (sore throat, sores in mouth, or a toothache) UNUSUAL RASH, SWELLING OR PAIN  UNUSUAL VAGINAL DISCHARGE OR ITCHING   Items with * indicate a potential emergency and should be followed up as soon as possible or go to the Emergency Department if any problems should occur.  Please show the CHEMOTHERAPY ALERT CARD or IMMUNOTHERAPY ALERT CARD at check-in to the  Emergency Department and triage nurse. Should you have questions after your visit or need to cancel or reschedule your appointment, please contact [facility]  [phone] and follow the prompts.  Office hours are 8:00 a.m. to 4:30 p.m. Monday - Friday. Please note that voicemails left after 4:00 p.m. may not be returned until the following business day.  We are closed weekends and major holidays. You have access to a nurse at all times for urgent questions. Please call the main number to the clinic [phone] and follow the prompts.  For any non-urgent questions, you may also contact your provider using MyChart. We now offer e-Visits for anyone 18 and older to request care online for non-urgent symptoms. For details visit [email or web address].   Also download the MyChart app! Go to the app store, search "MyChart", open the app, select , and log in with your MyChart username and password.  Masks are optional in the cancer centers. If you would like for your care team to wear a mask while they are taking care of you, please let them know. You may have one support person who is at least 86 years old accompany you for your appointments. 

## 2022-05-24 NOTE — Addendum Note (Signed)
Addended by: Burney Gauze R on: 05/24/2022 01:00 PM   Modules accepted: Orders

## 2022-05-24 NOTE — Progress Notes (Signed)
Hematology and Oncology Follow Up Visit  Shawn Bauer [facility] [id number] 02-25-1934 88 y.o. 05/24/2022   Principle Diagnosis:  IgG Kappa myeloma -- high risk due to t(4:21)   Current Therapy:        Velcade/Decadron -- started 10/09/2019, s/p cycle #`30--treatment frequency changed to every other week on 01/21/2021 Xgeva 120 mg IM q 3 months -- next dose on 06/2022   Interim History: Shawn Bauer is here today for follow-up.  He is doing okay.  He is still busy working outside.  He enjoys doing work.  He says that his back is little bit stiff on him.  His last Kappa light chain was 18.2 mg/dL.  There was no monoclonal spike in his blood.  His IgG level was 652 mg/dL.  He has had a little bit of diarrhea.  He has been taking quite a bit of Colace.  He has had no problems with cough.  He has had no bleeding.  There is been no leg swelling.  He has had no tingling or numbness in the hands or feet.  Overall, I would say his performance status is probably ECOG 1.     Medications:  Allergies as of 05/24/2022   No Known Allergies      Medication List        Accurate as of May 24, 2022 10:59 AM. If you have any questions, ask your nurse or doctor.          amiodarone 200 MG tablet Commonly known as: PACERONE Take 1 tablet (200 mg total) by mouth as directed. Take '200mg'$  daily Monday - Friday only.   aspirin EC 81 MG tablet Take 81 mg by mouth daily. Swallow whole.   atorvastatin 40 MG tablet Commonly known as: LIPITOR TAKE 1 TABLET BY MOUTH DAILY   CINNAMON PO Take 1,000 mg by mouth daily.   clopidogrel 75 MG tablet Commonly known as: PLAVIX TAKE 1 TABLET BY MOUTH EVERY DAY WITH BREAKFAST   cyanocobalamin 1000 MCG tablet Take 1,000 mcg by mouth daily.   melatonin 5 MG Tabs Take 15 mg by mouth as needed.   metoprolol succinate 25 MG 24 hr tablet Commonly known as: TOPROL-XL Take 12.5 mg by mouth daily.   nitroGLYCERIN 0.4 MG SL tablet Commonly known as:  NITROSTAT Place 1 tablet (0.4 mg total) under the tongue every 5 (five) minutes as needed for chest pain.   olmesartan 40 MG tablet Commonly known as: BENICAR Take 40 mg by mouth in the morning.   ondansetron 8 MG tablet Commonly known as: ZOFRAN Take 1 tablet (8 mg total) by mouth every 8 (eight) hours as needed for nausea or vomiting.   Symbicort 160-4.5 MCG/ACT inhaler Generic drug: budesonide-formoterol Inhale 2 puffs into the lungs 2 (two) times daily.   traMADol 50 MG tablet Commonly known as: ULTRAM Take 50 mg by mouth 2 (two) times daily as needed (for pain).        Allergies: No Known Allergies  Past Medical History, Surgical history, Social history, and Family History were reviewed and updated.  Review of Systems: Review of Systems  Constitutional: Negative.   HENT: Negative.    Eyes: Negative.   Respiratory: Negative.    Cardiovascular:  Positive for palpitations.  Gastrointestinal: Negative.   Genitourinary: Negative.   Musculoskeletal: Negative.   Skin: Negative.   Neurological: Negative.   Endo/Heme/Allergies: Negative.   Psychiatric/Behavioral: Negative.       Physical Exam:  height is '5\' 10"'$  (1.778 m) and  weight is 138 lb (62.6 kg). His oral temperature is 98.4 F (36.9 C). His pulse is 72. His respiration is 20 and oxygen saturation is 96%.   Wt Readings from Last 3 Encounters:  05/24/22 138 lb (62.6 kg)  04/12/22 141 lb 6.4 oz (64.1 kg)  03/15/22 140 lb 1.9 oz (63.6 kg)    Physical Exam Vitals reviewed.  HENT:     Head: Normocephalic and atraumatic.  Eyes:     Pupils: Pupils are equal, round, and reactive to light.  Cardiovascular:     Rate and Rhythm: Normal rate and regular rhythm.     Heart sounds: Normal heart sounds.  Pulmonary:     Effort: Pulmonary effort is normal.     Breath sounds: Normal breath sounds.  Abdominal:     General: Bowel sounds are normal.     Palpations: Abdomen is soft.  Musculoskeletal:        General:  No tenderness or deformity. Normal range of motion.     Cervical back: Normal range of motion.  Lymphadenopathy:     Cervical: No cervical adenopathy.  Skin:    General: Skin is warm and dry.     Findings: No erythema or rash.  Neurological:     Mental Status: He is alert and oriented to person, place, and time.  Psychiatric:        Behavior: Behavior normal.        Thought Content: Thought content normal.        Judgment: Judgment normal.      Lab Results  Component Value Date   WBC 7.1 05/24/2022   HGB 11.2 (L) 05/24/2022   HCT 36.2 (L) 05/24/2022   MCV 97.6 05/24/2022   PLT 180 05/24/2022   Lab Results  Component Value Date   FERRITIN 11 (L) 04/12/2022   IRON 36 (L) 04/12/2022   TIBC 330 04/12/2022   UIBC 294 04/12/2022   IRONPCTSAT 11 (L) 04/12/2022   Lab Results  Component Value Date   RETICCTPCT 1.9 04/12/2022   RBC 3.71 (L) 05/24/2022   Lab Results  Component Value Date   KPAFRELGTCHN 181.9 (H) 04/12/2022   LAMBDASER 9.9 04/12/2022   KAPLAMBRATIO 18.37 (H) 04/12/2022   Lab Results  Component Value Date   IGGSERUM 652 04/12/2022   IGA 119 04/12/2022   IGMSERUM 35 04/12/2022   Lab Results  Component Value Date   TOTALPROTELP 5.8 (L) 04/12/2022   ALBUMINELP 3.2 04/12/2022   A1GS 0.3 04/12/2022   A2GS 0.9 04/12/2022   BETS 0.8 04/12/2022   GAMS 0.6 04/12/2022   MSPIKE Not Observed 04/12/2022   SPEI Comment 04/03/2021     Chemistry      Component Value Date/Time   NA 139 05/24/2022 0951   K 4.5 05/24/2022 0951   CL 103 05/24/2022 0951   CO2 30 05/24/2022 0951   BUN 24 (H) 05/24/2022 0951   CREATININE 1.91 (H) 05/24/2022 0951      Component Value Date/Time   CALCIUM 9.3 05/24/2022 0951   ALKPHOS 52 05/24/2022 0951   AST 13 (L) 05/24/2022 0951   ALT 11 05/24/2022 0951   BILITOT 0.6 05/24/2022 0951       Impression and Plan: Shawn Bauer is a very pleasant 88 yo caucasian gentleman with IgG kappa myeloma.  We will have to continue to  follow his myeloma studies.  We will have to see what his light chain looks like.  Again, his quality life seems to be doing pretty  well.  We will plan for another follow-up in 3 to 4 weeks.     [name], MD 8/31/202310:59 AM

## 2022-05-25 ENCOUNTER — Other Ambulatory Visit: Payer: Self-pay

## 2022-05-25 LAB — KAPPA/LAMBDA LIGHT CHAINS
Kappa free light chain: 184.7 mg/L — ABNORMAL HIGH (ref 3.3–19.4)
Kappa, lambda light chain ratio: 18.11 — ABNORMAL HIGH (ref 0.26–1.65)
Lambda free light chains: 10.2 mg/L (ref 5.7–26.3)

## 2022-05-25 LAB — IGG, IGA, IGM
IgA: 128 mg/dL (ref 61–437)
IgG (Immunoglobin G), Serum: 679 mg/dL (ref 603–1613)
IgM (Immunoglobulin M), Srm: 51 mg/dL (ref 15–143)

## 2022-05-27 ENCOUNTER — Other Ambulatory Visit: Payer: Self-pay | Admitting: Cardiovascular Disease

## 2022-05-29 LAB — PROTEIN ELECTROPHORESIS, SERUM, WITH REFLEX
A/G Ratio: 1.2 (ref 0.7–1.7)
Albumin ELP: 3.1 g/dL (ref 2.9–4.4)
Alpha-1-Globulin: 0.2 g/dL (ref 0.0–0.4)
Alpha-2-Globulin: 0.8 g/dL (ref 0.4–1.0)
Beta Globulin: 0.7 g/dL (ref 0.7–1.3)
Gamma Globulin: 0.7 g/dL (ref 0.4–1.8)
Globulin, Total: 2.5 g/dL (ref 2.2–3.9)
Total Protein ELP: 5.6 g/dL — ABNORMAL LOW (ref 6.0–8.5)

## 2022-05-31 ENCOUNTER — Other Ambulatory Visit: Payer: Self-pay

## 2022-06-07 ENCOUNTER — Inpatient Hospital Stay: Payer: Medicare Other

## 2022-06-07 ENCOUNTER — Inpatient Hospital Stay: Payer: Medicare Other | Attending: Family

## 2022-06-07 VITALS — BP 158/56 | HR 72 | Temp 97.9°F | Resp 18

## 2022-06-07 DIAGNOSIS — C9000 Multiple myeloma not having achieved remission: Secondary | ICD-10-CM | POA: Diagnosis present

## 2022-06-07 DIAGNOSIS — Z5111 Encounter for antineoplastic chemotherapy: Secondary | ICD-10-CM | POA: Insufficient documentation

## 2022-06-07 LAB — CBC WITH DIFFERENTIAL (CANCER CENTER ONLY)
Abs Immature Granulocytes: 0.06 10*3/uL (ref 0.00–0.07)
Basophils Absolute: 0.1 10*3/uL (ref 0.0–0.1)
Basophils Relative: 1 %
Eosinophils Absolute: 0.2 10*3/uL (ref 0.0–0.5)
Eosinophils Relative: 2 %
HCT: 35.9 % — ABNORMAL LOW (ref 39.0–52.0)
Hemoglobin: 11.3 g/dL — ABNORMAL LOW (ref 13.0–17.0)
Immature Granulocytes: 1 %
Lymphocytes Relative: 15 %
Lymphs Abs: 1.5 10*3/uL (ref 0.7–4.0)
MCH: 30.7 pg (ref 26.0–34.0)
MCHC: 31.5 g/dL (ref 30.0–36.0)
MCV: 97.6 fL (ref 80.0–100.0)
Monocytes Absolute: 1 10*3/uL (ref 0.1–1.0)
Monocytes Relative: 10 %
Neutro Abs: 7.3 10*3/uL (ref 1.7–7.7)
Neutrophils Relative %: 71 %
Platelet Count: 171 10*3/uL (ref 150–400)
RBC: 3.68 MIL/uL — ABNORMAL LOW (ref 4.22–5.81)
RDW: 16.1 % — ABNORMAL HIGH (ref 11.5–15.5)
WBC Count: 10.1 10*3/uL (ref 4.0–10.5)
nRBC: 0 % (ref 0.0–0.2)

## 2022-06-07 LAB — CMP (CANCER CENTER ONLY)
ALT: 12 U/L (ref 0–44)
AST: 14 U/L — ABNORMAL LOW (ref 15–41)
Albumin: 3.7 g/dL (ref 3.5–5.0)
Alkaline Phosphatase: 54 U/L (ref 38–126)
Anion gap: 6 (ref 5–15)
BUN: 21 mg/dL (ref 8–23)
CO2: 29 mmol/L (ref 22–32)
Calcium: 9.1 mg/dL (ref 8.9–10.3)
Chloride: 105 mmol/L (ref 98–111)
Creatinine: 1.85 mg/dL — ABNORMAL HIGH (ref 0.61–1.24)
GFR, Estimated: 35 mL/min — ABNORMAL LOW (ref >60)
Glucose, Bld: 124 mg/dL — ABNORMAL HIGH (ref 70–99)
Potassium: 4.9 mmol/L (ref 3.5–5.1)
Sodium: 140 mmol/L (ref 135–145)
Total Bilirubin: 0.5 mg/dL (ref 0.3–1.2)
Total Protein: 6.2 g/dL — ABNORMAL LOW (ref 6.5–8.1)

## 2022-06-07 MED ORDER — DEXAMETHASONE 4 MG PO TABS
20.0000 mg | ORAL_TABLET | Freq: Once | ORAL | Status: AC
  Administered 2022-06-07: 20 mg via ORAL
  Filled 2022-06-07: qty 5

## 2022-06-07 MED ORDER — PROCHLORPERAZINE MALEATE 10 MG PO TABS
10.0000 mg | ORAL_TABLET | Freq: Once | ORAL | Status: AC
  Administered 2022-06-07: 10 mg via ORAL
  Filled 2022-06-07: qty 1

## 2022-06-07 MED ORDER — BORTEZOMIB CHEMO SQ INJECTION 3.5 MG (2.5MG/ML)
1.3000 mg/m2 | Freq: Once | INTRAMUSCULAR | Status: AC
  Administered 2022-06-07: 2.25 mg via SUBCUTANEOUS
  Filled 2022-06-07: qty 0.9

## 2022-06-07 NOTE — Progress Notes (Signed)
Dexamethasone 20 mg PO added to patient's premedications per Dr. Antonieta Pert instructions.

## 2022-06-07 NOTE — Progress Notes (Signed)
Reviewed pt labs with Dr. Marin Olp and pt ok to treat with creatinine 1.85

## 2022-06-07 NOTE — Patient Instructions (Signed)
[facility]  Discharge Instructions: Thank you for choosing [facility] to provide your oncology and hematology care.   If you have a lab appointment with the Cancer Center, please go directly to the Cancer Center and check in at the registration area.  Wear comfortable clothing and clothing appropriate for easy access to any Portacath or PICC line.   We strive to give you quality time with your provider. You may need to reschedule your appointment if you arrive late (15 or more minutes).  Arriving late affects you and other patients whose appointments are after yours.  Also, if you miss three or more appointments without notifying the office, you may be dismissed from the clinic at the provider's discretion.      For prescription refill requests, have your pharmacy contact our office and allow 72 hours for refills to be completed.    Today you received the following chemotherapy and/or immunotherapy agents Velcade      To help prevent nausea and vomiting after your treatment, we encourage you to take your nausea medication as directed.  BELOW ARE SYMPTOMS THAT SHOULD BE REPORTED IMMEDIATELY: *FEVER GREATER THAN 100.4 F (38 C) OR HIGHER *CHILLS OR SWEATING *NAUSEA AND VOMITING THAT IS NOT CONTROLLED WITH YOUR NAUSEA MEDICATION *UNUSUAL SHORTNESS OF BREATH *UNUSUAL BRUISING OR BLEEDING *URINARY PROBLEMS (pain or burning when urinating, or frequent urination) *BOWEL PROBLEMS (unusual diarrhea, constipation, pain near the anus) TENDERNESS IN MOUTH AND THROAT WITH OR WITHOUT PRESENCE OF ULCERS (sore throat, sores in mouth, or a toothache) UNUSUAL RASH, SWELLING OR PAIN  UNUSUAL VAGINAL DISCHARGE OR ITCHING   Items with * indicate a potential emergency and should be followed up as soon as possible or go to the Emergency Department if any problems should occur.  Please show the CHEMOTHERAPY ALERT CARD or IMMUNOTHERAPY ALERT CARD at check-in to the  Emergency Department and triage nurse. Should you have questions after your visit or need to cancel or reschedule your appointment, please contact [facility]  [phone] and follow the prompts.  Office hours are 8:00 a.m. to 4:30 p.m. Monday - Friday. Please note that voicemails left after 4:00 p.m. may not be returned until the following business day.  We are closed weekends and major holidays. You have access to a nurse at all times for urgent questions. Please call the main number to the clinic [phone] and follow the prompts.  For any non-urgent questions, you may also contact your provider using MyChart. We now offer e-Visits for anyone 18 and older to request care online for non-urgent symptoms. For details visit [email or web address].   Also download the MyChart app! Go to the app store, search "MyChart", open the app, select [facility], and log in with your MyChart username and password.  Masks are optional in the cancer centers. If you would like for your care team to wear a mask while they are taking care of you, please let them know. You may have one support person who is at least 86 years old accompany you for your appointments. 

## 2022-06-08 ENCOUNTER — Telehealth: Payer: Self-pay | Admitting: Cardiovascular Disease

## 2022-06-08 NOTE — Telephone Encounter (Signed)
Pt's medication was sent to pt's pharmacy, asking pt to make overdue appointment with Dr. Burt Knack before anymore refills, pt has had final attermpt to make overdue appt with Dr. Burt Knack and pt has not done so. I called pt and got no answer, to communicate with the pt about the overdue appt with Dr. Burt Knack.

## 2022-06-08 NOTE — Telephone Encounter (Signed)
*  STAT* If patient is at the pharmacy, call can be transferred to refill team.   1. Which medications need to be refilled? (please list name of each medication and dose if known) clopidogrel (PLAVIX) 75 MG tablet  2. Which pharmacy/location (including street and city if local pharmacy) is medication to be sent to? [id number]- [place], [place] - [address]  3. Do they need a 30 day or 90 day supply? [address]

## 2022-06-12 MED ORDER — CLOPIDOGREL BISULFATE 75 MG PO TABS: ORAL_TABLET | ORAL | 1 refills | Status: DC

## 2022-06-12 NOTE — Addendum Note (Signed)
Addended by: Carter Kitten D on: 06/12/2022 11:33 AM   Modules accepted: Orders

## 2022-06-12 NOTE — Telephone Encounter (Signed)
Wife called to follow up on the patient's refill for clopidogrel (PLAVIX) 75 MG tablet.  Wife stated patient has 2-3 tablets left.  Patient has appointment scheduled on 11/15.

## 2022-06-12 NOTE — Telephone Encounter (Signed)
Pt's medication was sent to pt's pharmacy as requested. Confirmation received.  °

## 2022-06-13 ENCOUNTER — Other Ambulatory Visit: Payer: Self-pay

## 2022-06-20 ENCOUNTER — Encounter: Payer: Self-pay | Admitting: Family

## 2022-06-20 ENCOUNTER — Inpatient Hospital Stay: Payer: Medicare Other

## 2022-06-20 ENCOUNTER — Telehealth: Payer: Self-pay | Admitting: *Deleted

## 2022-06-20 ENCOUNTER — Inpatient Hospital Stay (HOSPITAL_BASED_OUTPATIENT_CLINIC_OR_DEPARTMENT_OTHER): Payer: Medicare Other | Admitting: Family

## 2022-06-20 VITALS — BP 142/62 | HR 72 | Resp 16

## 2022-06-20 DIAGNOSIS — C9000 Multiple myeloma not having achieved remission: Secondary | ICD-10-CM | POA: Diagnosis not present

## 2022-06-20 DIAGNOSIS — Z5111 Encounter for antineoplastic chemotherapy: Secondary | ICD-10-CM | POA: Diagnosis not present

## 2022-06-20 LAB — CBC WITH DIFFERENTIAL (CANCER CENTER ONLY)
Abs Immature Granulocytes: 0.06 10*3/uL (ref 0.00–0.07)
Basophils Absolute: 0.1 10*3/uL (ref 0.0–0.1)
Basophils Relative: 1 %
Eosinophils Absolute: 0.2 10*3/uL (ref 0.0–0.5)
Eosinophils Relative: 2 %
HCT: 38.7 % — ABNORMAL LOW (ref 39.0–52.0)
Hemoglobin: 12 g/dL — ABNORMAL LOW (ref 13.0–17.0)
Immature Granulocytes: 1 %
Lymphocytes Relative: 19 %
Lymphs Abs: 1.9 10*3/uL (ref 0.7–4.0)
MCH: 30.1 pg (ref 26.0–34.0)
MCHC: 31 g/dL (ref 30.0–36.0)
MCV: 97 fL (ref 80.0–100.0)
Monocytes Absolute: 1.1 10*3/uL — ABNORMAL HIGH (ref 0.1–1.0)
Monocytes Relative: 11 %
Neutro Abs: 6.6 10*3/uL (ref 1.7–7.7)
Neutrophils Relative %: 66 %
Platelet Count: 199 10*3/uL (ref 150–400)
RBC: 3.99 MIL/uL — ABNORMAL LOW (ref 4.22–5.81)
RDW: 16 % — ABNORMAL HIGH (ref 11.5–15.5)
WBC Count: 9.9 10*3/uL (ref 4.0–10.5)
nRBC: 0 % (ref 0.0–0.2)

## 2022-06-20 LAB — CMP (CANCER CENTER ONLY)
ALT: 12 U/L (ref 0–44)
AST: 13 U/L — ABNORMAL LOW (ref 15–41)
Albumin: 3.9 g/dL (ref 3.5–5.0)
Alkaline Phosphatase: 56 U/L (ref 38–126)
Anion gap: 7 (ref 5–15)
BUN: 25 mg/dL — ABNORMAL HIGH (ref 8–23)
CO2: 30 mmol/L (ref 22–32)
Calcium: 9.9 mg/dL (ref 8.9–10.3)
Chloride: 103 mmol/L (ref 98–111)
Creatinine: 1.83 mg/dL — ABNORMAL HIGH (ref 0.61–1.24)
GFR, Estimated: 36 mL/min — ABNORMAL LOW (ref >60)
Glucose, Bld: 122 mg/dL — ABNORMAL HIGH (ref 70–99)
Potassium: 4.5 mmol/L (ref 3.5–5.1)
Sodium: 140 mmol/L (ref 135–145)
Total Bilirubin: 0.7 mg/dL (ref 0.3–1.2)
Total Protein: 6.5 g/dL (ref 6.5–8.1)

## 2022-06-20 LAB — LACTATE DEHYDROGENASE: LDH: 179 U/L (ref 98–192)

## 2022-06-20 MED ORDER — PROCHLORPERAZINE MALEATE 10 MG PO TABS
10.0000 mg | ORAL_TABLET | Freq: Once | ORAL | Status: AC
  Administered 2022-06-20: 10 mg via ORAL
  Filled 2022-06-20: qty 1

## 2022-06-20 MED ORDER — BORTEZOMIB CHEMO SQ INJECTION 3.5 MG (2.5MG/ML)
1.3000 mg/m2 | Freq: Once | INTRAMUSCULAR | Status: AC
  Administered 2022-06-20: 2.25 mg via SUBCUTANEOUS
  Filled 2022-06-20: qty 0.9

## 2022-06-20 MED ORDER — DEXAMETHASONE 4 MG PO TABS
20.0000 mg | ORAL_TABLET | Freq: Once | ORAL | Status: AC
  Administered 2022-06-20: 20 mg via ORAL
  Filled 2022-06-20: qty 5

## 2022-06-20 NOTE — Telephone Encounter (Signed)
Per 06/20/22 los - gave upcoming appointments - confirmed

## 2022-06-20 NOTE — Patient Instructions (Signed)
[facility]  Discharge Instructions: Thank you for choosing [facility] to provide your oncology and hematology care.   If you have a lab appointment with the Cancer Center, please go directly to the Cancer Center and check in at the registration area.  Wear comfortable clothing and clothing appropriate for easy access to any Portacath or PICC line.   We strive to give you quality time with your provider. You may need to reschedule your appointment if you arrive late (15 or more minutes).  Arriving late affects you and other patients whose appointments are after yours.  Also, if you miss three or more appointments without notifying the office, you may be dismissed from the clinic at the provider's discretion.      For prescription refill requests, have your pharmacy contact our office and allow 72 hours for refills to be completed.    Today you received the following chemotherapy and/or immunotherapy agents Velcade      To help prevent nausea and vomiting after your treatment, we encourage you to take your nausea medication as directed.  BELOW ARE SYMPTOMS THAT SHOULD BE REPORTED IMMEDIATELY: *FEVER GREATER THAN 100.4 F (38 C) OR HIGHER *CHILLS OR SWEATING *NAUSEA AND VOMITING THAT IS NOT CONTROLLED WITH YOUR NAUSEA MEDICATION *UNUSUAL SHORTNESS OF BREATH *UNUSUAL BRUISING OR BLEEDING *URINARY PROBLEMS (pain or burning when urinating, or frequent urination) *BOWEL PROBLEMS (unusual diarrhea, constipation, pain near the anus) TENDERNESS IN MOUTH AND THROAT WITH OR WITHOUT PRESENCE OF ULCERS (sore throat, sores in mouth, or a toothache) UNUSUAL RASH, SWELLING OR PAIN  UNUSUAL VAGINAL DISCHARGE OR ITCHING   Items with * indicate a potential emergency and should be followed up as soon as possible or go to the Emergency Department if any problems should occur.  Please show the CHEMOTHERAPY ALERT CARD or IMMUNOTHERAPY ALERT CARD at check-in to the  Emergency Department and triage nurse. Should you have questions after your visit or need to cancel or reschedule your appointment, please contact [facility]  [phone] and follow the prompts.  Office hours are 8:00 a.m. to 4:30 p.m. Monday - Friday. Please note that voicemails left after 4:00 p.m. may not be returned until the following business day.  We are closed weekends and major holidays. You have access to a nurse at all times for urgent questions. Please call the main number to the clinic [phone] and follow the prompts.  For any non-urgent questions, you may also contact your provider using MyChart. We now offer e-Visits for anyone 18 and older to request care online for non-urgent symptoms. For details visit [email or web address].   Also download the MyChart app! Go to the app store, search "MyChart", open the app, select [facility], and log in with your MyChart username and password.  Masks are optional in the cancer centers. If you would like for your care team to wear a mask while they are taking care of you, please let them know. You may have one support person who is at least 86 years old accompany you for your appointments. 

## 2022-06-20 NOTE — Progress Notes (Signed)
Hematology and Oncology Follow Up Visit  Shawn Bauer [facility] [id number] [birth year] 88 y.o. 06/20/2022   Principle Diagnosis:  IgG Kappa myeloma -- high risk due to t(4:21)   Current Therapy:        Velcade/Decadron -- started 10/09/2019, s/p cycle #`30--treatment frequency changed to every other week on 01/21/2021 Xgeva 120 mg IM q 3 months -- next dose on 06/2022          Interim History:  Shawn Bauer is here today for follow-up and treatment. He is doing well and has no complaints at this time.  He is working out in his yard and enjoying the weather cooling down.  Last month, M-spike not observed, IgG level 679 mg/dL and kappa light chains 18.47 mg/dL.  He has mild SOB with exertion at times and will take a break to rest if needed.  No fever, chills, n/v, cough, rash, dizziness, chest pain, palpitations, abdominal pain or changes in bowel or bladder habits.  No blood loss noted. No petechiae.  No swelling, tenderness, numbness or tingling in her extremities at this time.  No falls or syncope.  Appetite and hydration are good. Weight is stable at 137 lbs.   ECOG Performance Status: 1 - Symptomatic but completely ambulatory  Medications:  Allergies as of 06/20/2022   No Known Allergies      Medication List        Accurate as of June 20, 2022  9:52 AM. If you have any questions, ask your nurse or doctor.          amiodarone 200 MG tablet Commonly known as: PACERONE Take 1 tablet (200 mg total) by mouth as directed. Take '200mg'$  daily Monday - Friday only.   aspirin EC 81 MG tablet Take 81 mg by mouth daily. Swallow whole.   atorvastatin 40 MG tablet Commonly known as: LIPITOR TAKE 1 TABLET BY MOUTH DAILY   CINNAMON PO Take 1,000 mg by mouth daily.   clopidogrel 75 MG tablet Commonly known as: PLAVIX TAKE 1 TABLET BY MOUTH EVERY DAY WITH BREAKFAST   cyanocobalamin 1000 MCG tablet Take 1,000 mcg by mouth daily.   melatonin 5 MG Tabs Take 15 mg by mouth as  needed.   metoprolol succinate 25 MG 24 hr tablet Commonly known as: TOPROL-XL Take 12.5 mg by mouth daily.   nitroGLYCERIN 0.4 MG SL tablet Commonly known as: NITROSTAT Place 1 tablet (0.4 mg total) under the tongue every 5 (five) minutes as needed for chest pain.   olmesartan 40 MG tablet Commonly known as: BENICAR Take 40 mg by mouth in the morning.   ondansetron 8 MG tablet Commonly known as: ZOFRAN Take 1 tablet (8 mg total) by mouth every 8 (eight) hours as needed for nausea or vomiting.   Symbicort 160-4.5 MCG/ACT inhaler Generic drug: budesonide-formoterol Inhale 2 puffs into the lungs 2 (two) times daily.   traMADol 50 MG tablet Commonly known as: ULTRAM Take 50 mg by mouth 2 (two) times daily as needed (for pain).        Allergies: No Known Allergies  Past Medical History, Surgical history, Social history, and Family History were reviewed and updated.  Review of Systems: All other 10 point review of systems is negative.   Physical Exam:  vitals were not taken for this visit.   Wt Readings from Last 3 Encounters:  05/24/22 138 lb (62.6 kg)  04/12/22 141 lb 6.4 oz (64.1 kg)  03/15/22 140 lb 1.9 oz (63.6 kg)  Ocular: Sclerae unicteric, pupils equal, round and reactive to light Ear-nose-throat: Oropharynx clear, dentition fair Lymphatic: No cervical or supraclavicular adenopathy Lungs no rales or rhonchi, good excursion bilaterally Heart regular rate and rhythm, no murmur appreciated Abd soft, nontender, positive bowel sounds MSK no focal spinal tenderness, no joint edema Neuro: non-focal, well-oriented, appropriate affect Breasts: Deferred   Lab Results  Component Value Date   WBC 9.9 06/20/2022   HGB 12.0 (L) 06/20/2022   HCT 38.7 (L) 06/20/2022   MCV 97.0 06/20/2022   PLT 199 06/20/2022   Lab Results  Component Value Date   FERRITIN 11 (L) 04/12/2022   IRON 36 (L) 04/12/2022   TIBC 330 04/12/2022   UIBC 294 04/12/2022   IRONPCTSAT 11  (L) 04/12/2022   Lab Results  Component Value Date   RETICCTPCT 1.9 04/12/2022   RBC 3.99 (L) 06/20/2022   Lab Results  Component Value Date   KPAFRELGTCHN 184.7 (H) 05/24/2022   LAMBDASER 10.2 05/24/2022   KAPLAMBRATIO 18.11 (H) 05/24/2022   Lab Results  Component Value Date   IGGSERUM 679 05/24/2022   IGA 128 05/24/2022   IGMSERUM 51 05/24/2022   Lab Results  Component Value Date   TOTALPROTELP 5.6 (L) 05/24/2022   ALBUMINELP 3.1 05/24/2022   A1GS 0.2 05/24/2022   A2GS 0.8 05/24/2022   BETS 0.7 05/24/2022   GAMS 0.7 05/24/2022   MSPIKE Not Observed 05/24/2022   SPEI Comment 04/03/2021     Chemistry      Component Value Date/Time   NA 140 06/07/2022 1005   K 4.9 06/07/2022 1005   CL 105 06/07/2022 1005   CO2 29 06/07/2022 1005   BUN 21 06/07/2022 1005   CREATININE 1.85 (H) 06/07/2022 1005      Component Value Date/Time   CALCIUM 9.1 06/07/2022 1005   ALKPHOS 54 06/07/2022 1005   AST 14 (L) 06/07/2022 1005   ALT 12 06/07/2022 1005   BILITOT 0.5 06/07/2022 1005       Impression and Plan: Shawn Bauer is a very pleasant 88 yo caucasian gentleman with IgG kappa myeloma. Protein studies are pending. So far, he continues to tolerate treatment nicely.  We will proceed with treatment today.  Lab check and treatment every 2 weeks and follow-up in 4 weeks.   Lottie Dawson, NP 9/27/20239:52 AM

## 2022-06-20 NOTE — Progress Notes (Signed)
Ok to treat today with Creatinine of 1.83 per Dr Marin Olp.

## 2022-06-21 LAB — KAPPA/LAMBDA LIGHT CHAINS
Kappa free light chain: 201.9 mg/L — ABNORMAL HIGH (ref 3.3–19.4)
Kappa, lambda light chain ratio: 17.56 — ABNORMAL HIGH (ref 0.26–1.65)
Lambda free light chains: 11.5 mg/L (ref 5.7–26.3)

## 2022-06-22 LAB — PROTEIN ELECTROPHORESIS, SERUM, WITH REFLEX
A/G Ratio: 1.1 (ref 0.7–1.7)
Albumin ELP: 3.1 g/dL (ref 2.9–4.4)
Alpha-1-Globulin: 0.3 g/dL (ref 0.0–0.4)
Alpha-2-Globulin: 0.9 g/dL (ref 0.4–1.0)
Beta Globulin: 0.8 g/dL (ref 0.7–1.3)
Gamma Globulin: 0.7 g/dL (ref 0.4–1.8)
Globulin, Total: 2.7 g/dL (ref 2.2–3.9)
Total Protein ELP: 5.8 g/dL — ABNORMAL LOW (ref 6.0–8.5)

## 2022-06-22 LAB — IGG, IGA, IGM
IgA: 137 mg/dL (ref 61–437)
IgG (Immunoglobin G), Serum: 684 mg/dL (ref 603–1613)
IgM (Immunoglobulin M), Srm: 42 mg/dL (ref 15–143)

## 2022-07-04 ENCOUNTER — Inpatient Hospital Stay: Payer: Medicare Other | Attending: Family

## 2022-07-04 ENCOUNTER — Inpatient Hospital Stay: Payer: Medicare Other

## 2022-07-04 ENCOUNTER — Inpatient Hospital Stay: Payer: Medicare Other | Admitting: Licensed Clinical Social Worker

## 2022-07-04 VITALS — BP 175/73 | HR 77 | Temp 98.0°F | Resp 17

## 2022-07-04 DIAGNOSIS — C9000 Multiple myeloma not having achieved remission: Secondary | ICD-10-CM | POA: Diagnosis present

## 2022-07-04 DIAGNOSIS — Z5111 Encounter for antineoplastic chemotherapy: Secondary | ICD-10-CM | POA: Diagnosis present

## 2022-07-04 DIAGNOSIS — D631 Anemia in chronic kidney disease: Secondary | ICD-10-CM

## 2022-07-04 LAB — CBC WITH DIFFERENTIAL (CANCER CENTER ONLY)
Abs Immature Granulocytes: 0.05 10*3/uL (ref 0.00–0.07)
Basophils Absolute: 0.1 10*3/uL (ref 0.0–0.1)
Basophils Relative: 1 %
Eosinophils Absolute: 0.2 10*3/uL (ref 0.0–0.5)
Eosinophils Relative: 2 %
HCT: 37.3 % — ABNORMAL LOW (ref 39.0–52.0)
Hemoglobin: 11.8 g/dL — ABNORMAL LOW (ref 13.0–17.0)
Immature Granulocytes: 1 %
Lymphocytes Relative: 20 %
Lymphs Abs: 1.8 10*3/uL (ref 0.7–4.0)
MCH: 30.9 pg (ref 26.0–34.0)
MCHC: 31.6 g/dL (ref 30.0–36.0)
MCV: 97.6 fL (ref 80.0–100.0)
Monocytes Absolute: 0.9 10*3/uL (ref 0.1–1.0)
Monocytes Relative: 10 %
Neutro Abs: 5.7 10*3/uL (ref 1.7–7.7)
Neutrophils Relative %: 66 %
Platelet Count: 222 10*3/uL (ref 150–400)
RBC: 3.82 MIL/uL — ABNORMAL LOW (ref 4.22–5.81)
RDW: 15.9 % — ABNORMAL HIGH (ref 11.5–15.5)
WBC Count: 8.7 10*3/uL (ref 4.0–10.5)
nRBC: 0 % (ref 0.0–0.2)

## 2022-07-04 LAB — CMP (CANCER CENTER ONLY)
ALT: 11 U/L (ref 0–44)
AST: 11 U/L — ABNORMAL LOW (ref 15–41)
Albumin: 3.8 g/dL (ref 3.5–5.0)
Alkaline Phosphatase: 52 U/L (ref 38–126)
Anion gap: 8 (ref 5–15)
BUN: 26 mg/dL — ABNORMAL HIGH (ref 8–23)
CO2: 28 mmol/L (ref 22–32)
Calcium: 9.4 mg/dL (ref 8.9–10.3)
Chloride: 104 mmol/L (ref 98–111)
Creatinine: 1.92 mg/dL — ABNORMAL HIGH (ref 0.61–1.24)
GFR, Estimated: 34 mL/min — ABNORMAL LOW (ref >60)
Glucose, Bld: 137 mg/dL — ABNORMAL HIGH (ref 70–99)
Potassium: 4.7 mmol/L (ref 3.5–5.1)
Sodium: 140 mmol/L (ref 135–145)
Total Bilirubin: 0.7 mg/dL (ref 0.3–1.2)
Total Protein: 6.2 g/dL — ABNORMAL LOW (ref 6.5–8.1)

## 2022-07-04 LAB — LACTATE DEHYDROGENASE: LDH: 181 U/L (ref 98–192)

## 2022-07-04 MED ORDER — BORTEZOMIB CHEMO SQ INJECTION 3.5 MG (2.5MG/ML)
1.3000 mg/m2 | Freq: Once | INTRAMUSCULAR | Status: AC
  Administered 2022-07-04: 2.25 mg via SUBCUTANEOUS
  Filled 2022-07-04: qty 0.9

## 2022-07-04 MED ORDER — PROCHLORPERAZINE MALEATE 10 MG PO TABS
10.0000 mg | ORAL_TABLET | Freq: Once | ORAL | Status: AC
  Administered 2022-07-04: 10 mg via ORAL
  Filled 2022-07-04: qty 1

## 2022-07-04 MED ORDER — DENOSUMAB 120 MG/1.7ML ~~LOC~~ SOLN
120.0000 mg | Freq: Once | SUBCUTANEOUS | Status: AC
  Administered 2022-07-04: 120 mg via SUBCUTANEOUS
  Filled 2022-07-04: qty 1.7

## 2022-07-04 MED ORDER — DEXAMETHASONE 4 MG PO TABS
20.0000 mg | ORAL_TABLET | Freq: Once | ORAL | Status: AC
  Administered 2022-07-04: 20 mg via ORAL
  Filled 2022-07-04: qty 5

## 2022-07-04 NOTE — Progress Notes (Signed)
[facility] CSW Progress Note  Holiday representative met with patient to offer support while he was in infusion.  His granddaughter, [name], was present.  [name] engaged in conversation regarding his supportive family.  His granddaughter was visiting from [place] and he appeared to be very proud of her.  CSW provided an activity bag with puzzles since he stated how much he enjoys doing them.  He expressed no other needs.    Shawn Pickle Temitope Griffing, LCSW

## 2022-07-04 NOTE — Patient Instructions (Signed)
[facility]  Discharge Instructions: Thank you for choosing [facility] to provide your oncology and hematology care.   If you have a lab appointment with the Cancer Center, please go directly to the Cancer Center and check in at the registration area.  Wear comfortable clothing and clothing appropriate for easy access to any Portacath or PICC line.   We strive to give you quality time with your provider. You may need to reschedule your appointment if you arrive late (15 or more minutes).  Arriving late affects you and other patients whose appointments are after yours.  Also, if you miss three or more appointments without notifying the office, you may be dismissed from the clinic at the provider's discretion.      For prescription refill requests, have your pharmacy contact our office and allow 72 hours for refills to be completed.    Today you received the following chemotherapy and/or immunotherapy agents Velcade      To help prevent nausea and vomiting after your treatment, we encourage you to take your nausea medication as directed.  BELOW ARE SYMPTOMS THAT SHOULD BE REPORTED IMMEDIATELY: *FEVER GREATER THAN 100.4 F (38 C) OR HIGHER *CHILLS OR SWEATING *NAUSEA AND VOMITING THAT IS NOT CONTROLLED WITH YOUR NAUSEA MEDICATION *UNUSUAL SHORTNESS OF BREATH *UNUSUAL BRUISING OR BLEEDING *URINARY PROBLEMS (pain or burning when urinating, or frequent urination) *BOWEL PROBLEMS (unusual diarrhea, constipation, pain near the anus) TENDERNESS IN MOUTH AND THROAT WITH OR WITHOUT PRESENCE OF ULCERS (sore throat, sores in mouth, or a toothache) UNUSUAL RASH, SWELLING OR PAIN  UNUSUAL VAGINAL DISCHARGE OR ITCHING   Items with * indicate a potential emergency and should be followed up as soon as possible or go to the Emergency Department if any problems should occur.  Please show the CHEMOTHERAPY ALERT CARD or IMMUNOTHERAPY ALERT CARD at check-in to the  Emergency Department and triage nurse. Should you have questions after your visit or need to cancel or reschedule your appointment, please contact [facility]  [phone] and follow the prompts.  Office hours are 8:00 a.m. to 4:30 p.m. Monday - Friday. Please note that voicemails left after 4:00 p.m. may not be returned until the following business day.  We are closed weekends and major holidays. You have access to a nurse at all times for urgent questions. Please call the main number to the clinic [phone] and follow the prompts.  For any non-urgent questions, you may also contact your provider using MyChart. We now offer e-Visits for anyone 18 and older to request care online for non-urgent symptoms. For details visit [email or web address].   Also download the MyChart app! Go to the app store, search "MyChart", open the app, select [facility], and log in with your MyChart username and password.  Masks are optional in the cancer centers. If you would like for your care team to wear a mask while they are taking care of you, please let them know. You may have one support person who is at least 86 years old accompany you for your appointments. 

## 2022-07-04 NOTE — Progress Notes (Signed)
Ok to treat with Creatinine of 1.92 per Dr Marin Olp

## 2022-07-06 ENCOUNTER — Other Ambulatory Visit: Payer: Self-pay | Admitting: Cardiovascular Disease

## 2022-07-18 ENCOUNTER — Inpatient Hospital Stay: Payer: Medicare Other

## 2022-07-18 ENCOUNTER — Other Ambulatory Visit: Payer: Self-pay | Admitting: Pharmacist

## 2022-07-18 ENCOUNTER — Inpatient Hospital Stay (HOSPITAL_BASED_OUTPATIENT_CLINIC_OR_DEPARTMENT_OTHER): Payer: Medicare Other | Admitting: Family

## 2022-07-18 ENCOUNTER — Encounter: Payer: Self-pay | Admitting: Family

## 2022-07-18 DIAGNOSIS — C9000 Multiple myeloma not having achieved remission: Secondary | ICD-10-CM

## 2022-07-18 DIAGNOSIS — Z5111 Encounter for antineoplastic chemotherapy: Secondary | ICD-10-CM | POA: Diagnosis not present

## 2022-07-18 LAB — CMP (CANCER CENTER ONLY)
ALT: 12 U/L (ref 0–44)
AST: 13 U/L — ABNORMAL LOW (ref 15–41)
Albumin: 3.8 g/dL (ref 3.5–5.0)
Alkaline Phosphatase: 55 U/L (ref 38–126)
Anion gap: 7 (ref 5–15)
BUN: 25 mg/dL — ABNORMAL HIGH (ref 8–23)
CO2: 29 mmol/L (ref 22–32)
Calcium: 9.3 mg/dL (ref 8.9–10.3)
Chloride: 103 mmol/L (ref 98–111)
Creatinine: 1.73 mg/dL — ABNORMAL HIGH (ref 0.61–1.24)
GFR, Estimated: 38 mL/min — ABNORMAL LOW (ref >60)
Glucose, Bld: 122 mg/dL — ABNORMAL HIGH (ref 70–99)
Potassium: 3.9 mmol/L (ref 3.5–5.1)
Sodium: 139 mmol/L (ref 135–145)
Total Bilirubin: 0.6 mg/dL (ref 0.3–1.2)
Total Protein: 5.9 g/dL — ABNORMAL LOW (ref 6.5–8.1)

## 2022-07-18 LAB — CBC WITH DIFFERENTIAL (CANCER CENTER ONLY)
Abs Immature Granulocytes: 0.04 10*3/uL (ref 0.00–0.07)
Basophils Absolute: 0 10*3/uL (ref 0.0–0.1)
Basophils Relative: 0 %
Eosinophils Absolute: 0.2 10*3/uL (ref 0.0–0.5)
Eosinophils Relative: 2 %
HCT: 36.1 % — ABNORMAL LOW (ref 39.0–52.0)
Hemoglobin: 11.4 g/dL — ABNORMAL LOW (ref 13.0–17.0)
Immature Granulocytes: 0 %
Lymphocytes Relative: 16 %
Lymphs Abs: 1.5 10*3/uL (ref 0.7–4.0)
MCH: 31.3 pg (ref 26.0–34.0)
MCHC: 31.6 g/dL (ref 30.0–36.0)
MCV: 99.2 fL (ref 80.0–100.0)
Monocytes Absolute: 0.9 10*3/uL (ref 0.1–1.0)
Monocytes Relative: 10 %
Neutro Abs: 6.5 10*3/uL (ref 1.7–7.7)
Neutrophils Relative %: 72 %
Platelet Count: 173 10*3/uL (ref 150–400)
RBC: 3.64 MIL/uL — ABNORMAL LOW (ref 4.22–5.81)
RDW: 15.6 % — ABNORMAL HIGH (ref 11.5–15.5)
WBC Count: 9.1 10*3/uL (ref 4.0–10.5)
nRBC: 0 % (ref 0.0–0.2)

## 2022-07-18 LAB — LACTATE DEHYDROGENASE: LDH: 189 U/L (ref 98–192)

## 2022-07-18 MED ORDER — BORTEZOMIB CHEMO SQ INJECTION 3.5 MG (2.5MG/ML)
1.3000 mg/m2 | Freq: Once | INTRAMUSCULAR | Status: AC
  Administered 2022-07-18: 2.25 mg via SUBCUTANEOUS
  Filled 2022-07-18: qty 0.9

## 2022-07-18 MED ORDER — DEXAMETHASONE 4 MG PO TABS
20.0000 mg | ORAL_TABLET | Freq: Once | ORAL | Status: AC
  Administered 2022-07-18: 20 mg via ORAL
  Filled 2022-07-18: qty 5

## 2022-07-18 MED ORDER — PROCHLORPERAZINE MALEATE 10 MG PO TABS
10.0000 mg | ORAL_TABLET | Freq: Once | ORAL | Status: AC
  Administered 2022-07-18: 10 mg via ORAL
  Filled 2022-07-18: qty 1

## 2022-07-18 NOTE — Progress Notes (Signed)
OK to treat with today's creatinine level per Lottie Dawson, NP

## 2022-07-18 NOTE — Progress Notes (Signed)
Hematology and Oncology Follow Up Visit  Shawn Bauer [facility] [id number] 1933-10-06 88 y.o. 07/18/2022   Principle Diagnosis:  IgG Kappa myeloma -- high risk due to t(4:21)   Current Therapy:        Velcade/Decadron -- started 10/09/2019, s/p cycle #`30--treatment frequency changed to every other week on 01/21/2021 Xgeva 120 mg IM q 3 months -- next dose on 09/2022                      Interim History:  Shawn Bauer is here today for follow-up and treatment. He is doing well but does note fatigue at times.  He also has mild SOB with over exertion and will take a break to rest when needed.  He has been able to get out and work in his yard. He has arthritis in his joints and states that there are rotator cuff tears in both shoulders but he does not want to have surgery.  No swelling in his extremities.  No falls or syncope reported.  Last month, no M-spike observed, IgG level was 684 mg/dL and kappa light chains 20.19 mg/dL.  No fever, chills, n/v, cough, rash, dizziness, chest pain, palpitations, abdominal pain or changes in bowel or bladder habits. No blood loss noted. No bruising or petechiae.   Appetite and hydration have been good. Weight is stable at 139 lbs.   ECOG Performance Status: 1 - Symptomatic but completely ambulatory  Medications:  Allergies as of 07/18/2022   No Known Allergies      Medication List        Accurate as of July 18, 2022 11:48 AM. If you have any questions, ask your nurse or doctor.          amiodarone 200 MG tablet Commonly known as: PACERONE Take 1 tablet (200 mg total) by mouth as directed. Take '200mg'$  daily Monday - Friday only.   aspirin EC 81 MG tablet Take 81 mg by mouth daily. Swallow whole.   atorvastatin 40 MG tablet Commonly known as: LIPITOR TAKE 1 TABLET BY MOUTH DAILY   CINNAMON PO Take 1,000 mg by mouth daily.   clopidogrel 75 MG tablet Commonly known as: PLAVIX TAKE 1 TABLET BY MOUTH EVERY DAY WITH BREAKFAST    cyanocobalamin 1000 MCG tablet Take 1,000 mcg by mouth daily.   melatonin 5 MG Tabs Take 15 mg by mouth as needed.   metoprolol succinate 25 MG 24 hr tablet Commonly known as: TOPROL-XL Take 12.5 mg by mouth daily.   nitroGLYCERIN 0.4 MG SL tablet Commonly known as: NITROSTAT Place 1 tablet (0.4 mg total) under the tongue every 5 (five) minutes as needed for chest pain.   olmesartan 40 MG tablet Commonly known as: BENICAR Take 40 mg by mouth in the morning.   ondansetron 8 MG tablet Commonly known as: ZOFRAN Take 1 tablet (8 mg total) by mouth every 8 (eight) hours as needed for nausea or vomiting.   polyethylene glycol 17 g packet Commonly known as: MIRALAX / GLYCOLAX Take 17 g by mouth daily.   Symbicort 160-4.5 MCG/ACT inhaler Generic drug: budesonide-formoterol Inhale 2 puffs into the lungs 2 (two) times daily.   traMADol 50 MG tablet Commonly known as: ULTRAM Take 50 mg by mouth 2 (two) times daily as needed (for pain).        Allergies: No Known Allergies  Past Medical History, Surgical history, Social history, and Family History were reviewed and updated.  Review of Systems: All other 10  point review of systems is negative.   Physical Exam:  weight is 139 lb 6.4 oz (63.2 kg). His oral temperature is 97.9 F (36.6 C). His blood pressure is 159/58 (abnormal) and his pulse is 73. His respiration is 18 and oxygen saturation is 99%.   Wt Readings from Last 3 Encounters:  07/18/22 139 lb 6.4 oz (63.2 kg)  06/20/22 137 lb 12.8 oz (62.5 kg)  05/24/22 138 lb (62.6 kg)    Ocular: Sclerae unicteric, pupils equal, round and reactive to light Ear-nose-throat: Oropharynx clear, dentition fair Lymphatic: No cervical or supraclavicular adenopathy Lungs no rales or rhonchi, good excursion bilaterally Heart regular rate and rhythm, no murmur appreciated Abd soft, nontender, positive bowel sounds MSK no focal spinal tenderness, no joint edema Neuro: non-focal,  well-oriented, appropriate affect Breasts: Deferred   Lab Results  Component Value Date   WBC 9.1 07/18/2022   HGB 11.4 (L) 07/18/2022   HCT 36.1 (L) 07/18/2022   MCV 99.2 07/18/2022   PLT 173 07/18/2022   Lab Results  Component Value Date   FERRITIN 11 (L) 04/12/2022   IRON 36 (L) 04/12/2022   TIBC 330 04/12/2022   UIBC 294 04/12/2022   IRONPCTSAT 11 (L) 04/12/2022   Lab Results  Component Value Date   RETICCTPCT 1.9 04/12/2022   RBC 3.64 (L) 07/18/2022   Lab Results  Component Value Date   KPAFRELGTCHN 201.9 (H) 06/20/2022   LAMBDASER 11.5 06/20/2022   KAPLAMBRATIO 17.56 (H) 06/20/2022   Lab Results  Component Value Date   IGGSERUM 684 06/20/2022   IGA 137 06/20/2022   IGMSERUM 42 06/20/2022   Lab Results  Component Value Date   TOTALPROTELP 5.8 (L) 06/20/2022   ALBUMINELP 3.1 06/20/2022   A1GS 0.3 06/20/2022   A2GS 0.9 06/20/2022   BETS 0.8 06/20/2022   GAMS 0.7 06/20/2022   MSPIKE Not Observed 06/20/2022   SPEI Comment 04/03/2021     Chemistry      Component Value Date/Time   NA 139 07/18/2022 1103   K 3.9 07/18/2022 1103   CL 103 07/18/2022 1103   CO2 29 07/18/2022 1103   BUN 25 (H) 07/18/2022 1103   CREATININE 1.73 (H) 07/18/2022 1103      Component Value Date/Time   CALCIUM 9.3 07/18/2022 1103   ALKPHOS 55 07/18/2022 1103   AST 13 (L) 07/18/2022 1103   ALT 12 07/18/2022 1103   BILITOT 0.6 07/18/2022 1103       Impression and Plan: Shawn Bauer is a very pleasant 88 yo caucasian gentleman with IgG kappa myeloma. Protein studies are pending. So far, he continues to tolerate treatment nicely.  We will proceed with treatment today.  Lab check and treatment every 2 weeks and follow-up in 4 weeks.   Lottie Dawson, NP 10/25/202311:48 AM

## 2022-07-19 ENCOUNTER — Other Ambulatory Visit: Payer: Self-pay

## 2022-07-19 LAB — KAPPA/LAMBDA LIGHT CHAINS
Kappa free light chain: 185 mg/L — ABNORMAL HIGH (ref 3.3–19.4)
Kappa, lambda light chain ratio: 19.47 — ABNORMAL HIGH (ref 0.26–1.65)
Lambda free light chains: 9.5 mg/L (ref 5.7–26.3)

## 2022-07-19 LAB — IGG, IGA, IGM
IgA: 118 mg/dL (ref 61–437)
IgG (Immunoglobin G), Serum: 649 mg/dL (ref 603–1613)
IgM (Immunoglobulin M), Srm: 36 mg/dL (ref 15–143)

## 2022-07-20 LAB — PROTEIN ELECTROPHORESIS, SERUM
A/G Ratio: 1.5 (ref 0.7–1.7)
Albumin ELP: 3.3 g/dL (ref 2.9–4.4)
Alpha-1-Globulin: 0.2 g/dL (ref 0.0–0.4)
Alpha-2-Globulin: 0.7 g/dL (ref 0.4–1.0)
Beta Globulin: 0.7 g/dL (ref 0.7–1.3)
Gamma Globulin: 0.6 g/dL (ref 0.4–1.8)
Globulin, Total: 2.2 g/dL (ref 2.2–3.9)
Total Protein ELP: 5.5 g/dL — ABNORMAL LOW (ref 6.0–8.5)

## 2022-07-30 ENCOUNTER — Other Ambulatory Visit: Payer: Self-pay | Admitting: Cardiovascular Disease

## 2022-07-31 ENCOUNTER — Other Ambulatory Visit: Payer: Self-pay

## 2022-08-01 ENCOUNTER — Inpatient Hospital Stay: Payer: Medicare Other

## 2022-08-01 ENCOUNTER — Inpatient Hospital Stay: Payer: Medicare Other | Attending: Family

## 2022-08-01 VITALS — BP 146/56 | HR 70 | Temp 97.9°F | Resp 17

## 2022-08-01 DIAGNOSIS — Z5111 Encounter for antineoplastic chemotherapy: Secondary | ICD-10-CM | POA: Insufficient documentation

## 2022-08-01 DIAGNOSIS — C9000 Multiple myeloma not having achieved remission: Secondary | ICD-10-CM | POA: Insufficient documentation

## 2022-08-01 LAB — CMP (CANCER CENTER ONLY)
ALT: 14 U/L (ref 0–44)
AST: 15 U/L (ref 15–41)
Albumin: 3.9 g/dL (ref 3.5–5.0)
Alkaline Phosphatase: 57 U/L (ref 38–126)
Anion gap: 6 (ref 5–15)
BUN: 25 mg/dL — ABNORMAL HIGH (ref 8–23)
CO2: 29 mmol/L (ref 22–32)
Calcium: 9.4 mg/dL (ref 8.9–10.3)
Chloride: 107 mmol/L (ref 98–111)
Creatinine: 1.9 mg/dL — ABNORMAL HIGH (ref 0.61–1.24)
GFR, Estimated: 34 mL/min — ABNORMAL LOW (ref >60)
Glucose, Bld: 145 mg/dL — ABNORMAL HIGH (ref 70–99)
Potassium: 4.8 mmol/L (ref 3.5–5.1)
Sodium: 142 mmol/L (ref 135–145)
Total Bilirubin: 0.7 mg/dL (ref 0.3–1.2)
Total Protein: 6.3 g/dL — ABNORMAL LOW (ref 6.5–8.1)

## 2022-08-01 LAB — CBC WITH DIFFERENTIAL (CANCER CENTER ONLY)
Abs Immature Granulocytes: 0.05 10*3/uL (ref 0.00–0.07)
Basophils Absolute: 0.1 10*3/uL (ref 0.0–0.1)
Basophils Relative: 1 %
Eosinophils Absolute: 0.2 10*3/uL (ref 0.0–0.5)
Eosinophils Relative: 3 %
HCT: 38.3 % — ABNORMAL LOW (ref 39.0–52.0)
Hemoglobin: 12 g/dL — ABNORMAL LOW (ref 13.0–17.0)
Immature Granulocytes: 1 %
Lymphocytes Relative: 23 %
Lymphs Abs: 1.8 10*3/uL (ref 0.7–4.0)
MCH: 31.2 pg (ref 26.0–34.0)
MCHC: 31.3 g/dL (ref 30.0–36.0)
MCV: 99.5 fL (ref 80.0–100.0)
Monocytes Absolute: 0.9 10*3/uL (ref 0.1–1.0)
Monocytes Relative: 12 %
Neutro Abs: 4.6 10*3/uL (ref 1.7–7.7)
Neutrophils Relative %: 60 %
Platelet Count: 193 10*3/uL (ref 150–400)
RBC: 3.85 MIL/uL — ABNORMAL LOW (ref 4.22–5.81)
RDW: 15.2 % (ref 11.5–15.5)
WBC Count: 7.6 10*3/uL (ref 4.0–10.5)
nRBC: 0 % (ref 0.0–0.2)

## 2022-08-01 MED ORDER — BORTEZOMIB CHEMO SQ INJECTION 3.5 MG (2.5MG/ML)
1.3000 mg/m2 | Freq: Once | INTRAMUSCULAR | Status: AC
  Administered 2022-08-01: 2.25 mg via SUBCUTANEOUS
  Filled 2022-08-01: qty 0.9

## 2022-08-01 MED ORDER — DEXAMETHASONE 4 MG PO TABS
20.0000 mg | ORAL_TABLET | Freq: Once | ORAL | Status: AC
  Administered 2022-08-01: 20 mg via ORAL
  Filled 2022-08-01: qty 5

## 2022-08-01 MED ORDER — PROCHLORPERAZINE MALEATE 10 MG PO TABS
10.0000 mg | ORAL_TABLET | Freq: Once | ORAL | Status: AC
  Administered 2022-08-01: 10 mg via ORAL
  Filled 2022-08-01: qty 1

## 2022-08-08 ENCOUNTER — Ambulatory Visit: Payer: Medicare Other | Attending: Cardiovascular Disease | Admitting: Cardiovascular Disease

## 2022-08-08 ENCOUNTER — Encounter: Payer: Self-pay | Admitting: Cardiovascular Disease

## 2022-08-08 VITALS — BP 136/64 | HR 76 | Ht 70.0 in | Wt 141.0 lb

## 2022-08-08 DIAGNOSIS — E782 Mixed hyperlipidemia: Secondary | ICD-10-CM

## 2022-08-08 DIAGNOSIS — I483 Typical atrial flutter: Secondary | ICD-10-CM

## 2022-08-08 DIAGNOSIS — I10 Essential (primary) hypertension: Secondary | ICD-10-CM

## 2022-08-08 DIAGNOSIS — I2510 Atherosclerotic heart disease of native coronary artery without angina pectoris: Secondary | ICD-10-CM

## 2022-08-08 DIAGNOSIS — Z79899 Other long term (current) drug therapy: Secondary | ICD-10-CM

## 2022-08-08 NOTE — Progress Notes (Signed)
Cardiology Office Note:    Date:  08/08/2022   ID:  Shawn Bauer, DOB 1934/02/28, MRN [id number]  PCP:  Shawn Bauer, [place] Providers Cardiologist:  Sherren Mocha, MD Electrophysiologist:  Cristopher Peru, MD     Referring MD: Shawn Gravel, MD   Chief Complaint  Patient presents with   Coronary Artery Disease    History of Present Illness:    Shawn Bauer is a 88 y.o. male with a hx of coronary artery disease and atrial flutter, presenting for follow-up evaluation.  The patient had remote CABG in 2001.  He did well for many years until he developed angina in 2022.  He was found to have severe stenosis of the native RCA and underwent complex intervention requiring overlapping drug-eluting stents.  He developed recurrent angina a few weeks later and cardiac catheterization demonstrated stable coronary anatomy.  However he was having paroxysms of atrial flutter with RVR.  He ultimately was treated with flutter ablation and has subsequently come off of oral anticoagulation.  He is maintained on DAPT with aspirin and clopidogrel.  The patient is here alone today.  I saw him last in 2020.  He is actually doing very well.  He complains of easy bruising on aspirin and clopidogrel.  Otherwise he has no specific cardiac-related complaints.  He is had no further chest pain, chest pressure, shortness of breath, edema, or heart palpitations.  Past Medical History:  Diagnosis Date   Anemia of chronic kidney failure, stage 3 (moderate) (HCC) 03/15/2022   Asthma    Atrial fibrillation (HCC)    CAD (coronary artery disease)    s/p CABG   Chronic renal insufficiency, stage 3 (moderate) (HCC) 03/15/2022   Degenerative disk disease    cervical/spinal stenosis   Goals of care, counseling/discussion 09/30/2019   HTN (hypertension)    Hyperlipidemia    Iron deficiency anemia 04/12/2022   Kappa light chain myeloma ([place]) 09/30/2019    Past Surgical History:  Procedure Laterality Date    A-FLUTTER ABLATION N/A 05/23/2020   Procedure: A-FLUTTER ABLATION;  Surgeon: Evans Lance, MD;  Location: [facility];  Service: Cardiovascular;  Laterality: N/A;   APPENDECTOMY     BACK SURGERY     bilateral inguinal hernia repair     CORONARY ARTERY BYPASS GRAFT  2001   after positive exercise treadmill test.  LIMA-LAD, Radial artery to LCx marginal, SVG to second diagonal, SVG to PDA   CORONARY STENT INTERVENTION N/A 05/08/2021   Procedure: CORONARY STENT INTERVENTION;  Surgeon: Belva Crome, MD;  Location: [facility];  Service: Cardiovascular;  Laterality: N/A;   LEFT HEART CATH AND CORONARY ANGIOGRAPHY N/A 06/01/2021   Procedure: LEFT HEART CATH AND CORONARY ANGIOGRAPHY;  Surgeon: Lorretta Harp, MD;  Location: [facility];  Service: Cardiovascular;  Laterality: N/A;   LEFT HEART CATH AND CORS/GRAFTS ANGIOGRAPHY N/A 05/08/2021   Procedure: LEFT HEART CATH AND CORS/GRAFTS ANGIOGRAPHY;  Surgeon: Belva Crome, MD;  Location: [facility];  Service: Cardiovascular;  Laterality: N/A;   TONSILLECTOMY AND ADENOIDECTOMY      Current Medications: Current Meds  Medication Sig   amiodarone (PACERONE) 200 MG tablet Take 1 tablet (200 mg total) by mouth as directed. Take 217m daily Monday - Friday only. (Patient taking differently: Take 200 mg by mouth as directed. Take 1/2 tablet by mouth daily)   aspirin EC 81 MG tablet Take 81 mg by mouth daily. Swallow  whole.   atorvastatin (LIPITOR) 40 MG tablet TAKE 1 TABLET BY MOUTH DAILY   CINNAMON PO Take 1,000 mg by mouth daily.   clopidogrel (PLAVIX) 75 MG tablet TAKE 1 TABLET BY MOUTH EVERY DAY WITH BREAKFAST   cyanocobalamin 1000 MCG tablet Take 1,000 mcg by mouth daily.   melatonin 5 MG TABS Take 15 mg by mouth as needed.   metoprolol succinate (TOPROL-XL) 25 MG 24 hr tablet Take 12.5 mg by mouth daily.   nitroGLYCERIN (NITROSTAT) 0.4 MG SL tablet Place 1 tablet (0.4 mg total) under the tongue every 5 (five)  minutes as needed for chest pain.   olmesartan (BENICAR) 40 MG tablet Take 40 mg by mouth in the morning.   ondansetron (ZOFRAN) 8 MG tablet Take 1 tablet (8 mg total) by mouth every 8 (eight) hours as needed for nausea or vomiting.   polyethylene glycol (MIRALAX / GLYCOLAX) 17 g packet Take 17 g by mouth daily.   SYMBICORT 160-4.5 MCG/ACT inhaler Inhale 2 puffs into the lungs 2 (two) times daily.   traMADol (ULTRAM) 50 MG tablet Take 50 mg by mouth 2 (two) times daily as needed (for pain).     Allergies:   Patient has no known allergies.   Social History   Socioeconomic History   Marital status: Married    Spouse name: Not on file   Number of children: 3   Years of education: Not on file   Highest education level: Not on file  Occupational History   Occupation: [employer]  Tobacco Use   Smoking status: Former    Packs/day: 0.50    Years: 15.00    Total pack years: 7.50    Types: Cigarettes    Quit date: 08/31/1964    Years since quitting: 57.9   Smokeless tobacco: Former    Types: Chew    Quit date: 04/24/2016  Vaping Use   Vaping Use: Never used  Substance and Sexual Activity   Alcohol use: No   Drug use: No   Sexual activity: Not Currently  Other Topics Concern   Not on file  Social History Narrative   Not on file   Social Determinants of Health   Financial Resource Strain: Low Risk  (07/04/2022)   Overall Financial Resource Strain (CARDIA)    Difficulty of Paying Living Expenses: Not hard at all  Food Insecurity: No Food Insecurity (07/04/2022)   Hunger Vital Sign    Worried About Running Out of Food in the Last Year: Never true    [place] in the Last Year: Never true  Transportation Needs: No Transportation Needs (07/04/2022)   PRAPARE - Hydrologist (Medical): No    Lack of Transportation (Non-Medical): No  Physical Activity: Not on file  Stress: Not on file  Social Connections: Not on file     Family  History: The patient's family history includes Heart attack in his brother; Heart attack (age of onset: 53) in his father.  ROS:   Please see the history of present illness.    All other systems reviewed and are negative.  EKGs/Labs/Other Studies Reviewed:    The following studies were reviewed today: Cardiac Cath 05-08-2021:   Occlusion of saphenous vein graft to right coronary   Patent saphenous vein graft to diagonal   Patent free left radial to circumflex   Patent LIMA to LAD   Total occlusion of proximal circumflex   99% distal left main   Total  occlusion of proximal to mid LAD   Native RCA heavily calcified with ostial to distal disease.  Focal 90% stenosis within a diffusely diseased segment felt to be culprit for angina.  TIMI grade III flow noted.   Successful high risk RCA PCI complicated by balloon rupture and spiral dissection treated with overlapping 2.5 mm drug-eluting stents 12 mm x 2.5 distal overlap with a 22 x 2.5 mm from distal to mid vessel.  TIMI grade III flow.  Residual stenosis at the overlap site approximately 20%.   Aspirin and Plavix for at least 12 months then monotherapy with Plavix thereafter. Bivalirudin for 2 hours before discontinuation.  Half dose infusion. Preventive therapy per treating team. At risk for development of femoral bleed.  Needs to be followed closely since sheath will not be pulled in the Cath Lab.  EKG:  EKG is not ordered today.    Recent Labs: 01/15/2022: TSH 1.950 08/01/2022: ALT 14; BUN 25; Creatinine 1.90; Hemoglobin 12.0; Platelet Count 193; Potassium 4.8; Sodium 142  Recent Lipid Panel    Component Value Date/Time   CHOL 114 05/08/2021 0645   TRIG 48 05/08/2021 0645   HDL 35 (L) 05/08/2021 0645   CHOLHDL 3.3 05/08/2021 0645   VLDL 10 05/08/2021 0645   LDLCALC 69 05/08/2021 0645     Risk Assessment/Calculations:                Physical Exam:    VS:  BP 136/64   Pulse 76   Ht _0  (1.778 m)   Wt 141 lb (64  kg)   BMI 20.23 kg/m     Wt Readings from Last 3 Encounters:  08/08/22 141 lb (64 kg)  07/18/22 139 lb 6.4 oz (63.2 kg)  06/20/22 137 lb 12.8 oz (62.5 kg)     GEN:  Well nourished, well developed elderly male in no acute distress HEENT: Normal NECK: No JVD; No carotid bruits LYMPHATICS: No lymphadenopathy CARDIAC: RRR, no murmurs, rubs, gallops RESPIRATORY:  Clear to auscultation without rales, wheezing or rhonchi  ABDOMEN: Soft, non-tender, non-distended MUSCULOSKELETAL:  No edema; No deformity  SKIN: Warm and dry NEUROLOGIC:  Alert and oriented x 3 PSYCHIATRIC:  Normal affect   ASSESSMENT:    1. Medication management   2. Typical atrial flutter ([place])   3. Coronary artery disease involving native coronary artery of native heart without angina pectoris   4. Mixed hyperlipidemia   5. Essential hypertension    PLAN:    In order of problems listed above:  We discussed long-term amiodarone use today.  He is on 100 mg daily.  LFTs and been recently drawn.  Thyroid function studies were drawn 6 months ago and were normal.  We will add thyroid function testing onto his next lab work at the cancer center where he is followed regularly for multiple myeloma.  Advised him of the need for an annual eye exam. No recurrence.  Followed by the EP service.  Continued on low-dose amiodarone.  Not on oral anticoagulation. No recurrence of angina since last PCI procedure.  Continue DAPT with aspirin and clopidogrel with his history of remote CABG and PCI. Treated with a high intensity statin drug.  Lipids reviewed with cholesterol 114, HDL 35, LDL 69. Blood pressure well controlled on telmisartan and metoprolol succinate.           Medication Adjustments/Labs and Tests Ordered: Current medicines are reviewed at length with the patient today.  Concerns regarding medicines are outlined above.  Orders Placed This Encounter  Procedures   TSH   No orders of the defined types were placed in  this encounter.   Patient Instructions  Medication Instructions:  Your physician recommends that you continue on your current medications as directed. Please refer to the Current Medication list given to you today.  *If you need a refill on your cardiac medications before your next appointment, please call your pharmacy*   Lab Work: TSH (at cancer center with upcoming labs) If you have labs (blood work) drawn today and your tests are completely normal, you will receive your results only by: [place] (if you have MyChart) OR A paper copy in the mail If you have any lab test that is abnormal or we need to change your treatment, we will call you to review the results.   Testing/Procedures: **Need to get annual eye exam for amiodarone monitoring**   Follow-Up: At [facility], you and your health needs are our priority.  As part of our continuing mission to provide you with exceptional heart care, we have created designated Provider Care Teams.  These Care Teams include your primary Cardiologist (physician) and Advanced Practice Providers (APPs -  Physician Assistants and Nurse Practitioners) who all work together to provide you with the care you need, when you need it.  We recommend signing up for the patient portal called "MyChart".  Sign up information is provided on this After Visit Summary.  MyChart is used to connect with patients for Virtual Visits (Telemedicine).  Patients are able to view lab/test results, encounter notes, upcoming appointments, etc.  Non-urgent messages can be sent to your provider as well.   To learn more about what you can do with MyChart, go to [email or web address].    Your next appointment:   1 year(s)  The format for your next appointment:   In Person  Provider:   Sherren Mocha, MD  or APP     Important Information About Sugar         Signed, Sherren Mocha, MD  08/08/2022 5:28 PM    [place]

## 2022-08-08 NOTE — Patient Instructions (Signed)
Medication Instructions:  Your physician recommends that you continue on your current medications as directed. Please refer to the Current Medication list given to you today.  *If you need a refill on your cardiac medications before your next appointment, please call your pharmacy*   Lab Work: TSH (at cancer center with upcoming labs) If you have labs (blood work) drawn today and your tests are completely normal, you will receive your results only by: [place] (if you have MyChart) OR A paper copy in the mail If you have any lab test that is abnormal or we need to change your treatment, we will call you to review the results.   Testing/Procedures: **Need to get annual eye exam for amiodarone monitoring**   Follow-Up: At [facility], you and your health needs are our priority.  As part of our continuing mission to provide you with exceptional heart care, we have created designated Provider Care Teams.  These Care Teams include your primary Cardiologist (physician) and Advanced Practice Providers (APPs -  Physician Assistants and Nurse Practitioners) who all work together to provide you with the care you need, when you need it.  We recommend signing up for the patient portal called "MyChart".  Sign up information is provided on this After Visit Summary.  MyChart is used to connect with patients for Virtual Visits (Telemedicine).  Patients are able to view lab/test results, encounter notes, upcoming appointments, etc.  Non-urgent messages can be sent to your provider as well.   To learn more about what you can do with MyChart, go to [email or web address].    Your next appointment:   1 year(s)  The format for your next appointment:   In Person  Provider:   Sherren Mocha, MD  or APP     Important Information About Sugar

## 2022-08-15 ENCOUNTER — Other Ambulatory Visit: Payer: Self-pay

## 2022-08-15 ENCOUNTER — Inpatient Hospital Stay (HOSPITAL_BASED_OUTPATIENT_CLINIC_OR_DEPARTMENT_OTHER): Payer: Medicare Other | Admitting: Hematology & Oncology

## 2022-08-15 ENCOUNTER — Encounter: Payer: Self-pay | Admitting: Hematology & Oncology

## 2022-08-15 ENCOUNTER — Inpatient Hospital Stay: Payer: Medicare Other

## 2022-08-15 ENCOUNTER — Other Ambulatory Visit: Payer: Self-pay | Admitting: Cardiovascular Disease

## 2022-08-15 DIAGNOSIS — C9000 Multiple myeloma not having achieved remission: Secondary | ICD-10-CM

## 2022-08-15 DIAGNOSIS — Z5111 Encounter for antineoplastic chemotherapy: Secondary | ICD-10-CM | POA: Diagnosis not present

## 2022-08-15 LAB — CBC WITH DIFFERENTIAL (CANCER CENTER ONLY)
Abs Immature Granulocytes: 0.03 10*3/uL (ref 0.00–0.07)
Basophils Absolute: 0.1 10*3/uL (ref 0.0–0.1)
Basophils Relative: 1 %
Eosinophils Absolute: 0.2 10*3/uL (ref 0.0–0.5)
Eosinophils Relative: 3 %
HCT: 36.7 % — ABNORMAL LOW (ref 39.0–52.0)
Hemoglobin: 11.6 g/dL — ABNORMAL LOW (ref 13.0–17.0)
Immature Granulocytes: 0 %
Lymphocytes Relative: 23 %
Lymphs Abs: 1.7 10*3/uL (ref 0.7–4.0)
MCH: 32 pg (ref 26.0–34.0)
MCHC: 31.6 g/dL (ref 30.0–36.0)
MCV: 101.1 fL — ABNORMAL HIGH (ref 80.0–100.0)
Monocytes Absolute: 0.8 10*3/uL (ref 0.1–1.0)
Monocytes Relative: 10 %
Neutro Abs: 4.7 10*3/uL (ref 1.7–7.7)
Neutrophils Relative %: 63 %
Platelet Count: 183 10*3/uL (ref 150–400)
RBC: 3.63 MIL/uL — ABNORMAL LOW (ref 4.22–5.81)
RDW: 15 % (ref 11.5–15.5)
WBC Count: 7.5 10*3/uL (ref 4.0–10.5)
nRBC: 0 % (ref 0.0–0.2)

## 2022-08-15 LAB — CMP (CANCER CENTER ONLY)
ALT: 13 U/L (ref 0–44)
AST: 14 U/L — ABNORMAL LOW (ref 15–41)
Albumin: 3.9 g/dL (ref 3.5–5.0)
Alkaline Phosphatase: 49 U/L (ref 38–126)
Anion gap: 7 (ref 5–15)
BUN: 25 mg/dL — ABNORMAL HIGH (ref 8–23)
CO2: 30 mmol/L (ref 22–32)
Calcium: 9.3 mg/dL (ref 8.9–10.3)
Chloride: 106 mmol/L (ref 98–111)
Creatinine: 1.81 mg/dL — ABNORMAL HIGH (ref 0.61–1.24)
GFR, Estimated: 36 mL/min — ABNORMAL LOW (ref >60)
Glucose, Bld: 127 mg/dL — ABNORMAL HIGH (ref 70–99)
Potassium: 4 mmol/L (ref 3.5–5.1)
Sodium: 143 mmol/L (ref 135–145)
Total Bilirubin: 0.7 mg/dL (ref 0.3–1.2)
Total Protein: 6 g/dL — ABNORMAL LOW (ref 6.5–8.1)

## 2022-08-15 LAB — LACTATE DEHYDROGENASE: LDH: 183 U/L (ref 98–192)

## 2022-08-15 MED ORDER — BORTEZOMIB CHEMO SQ INJECTION 3.5 MG (2.5MG/ML)
1.3000 mg/m2 | Freq: Once | INTRAMUSCULAR | Status: AC
  Administered 2022-08-15: 2.25 mg via SUBCUTANEOUS
  Filled 2022-08-15: qty 0.9

## 2022-08-15 MED ORDER — DEXAMETHASONE 4 MG PO TABS
20.0000 mg | ORAL_TABLET | Freq: Once | ORAL | Status: AC
  Administered 2022-08-15: 20 mg via ORAL
  Filled 2022-08-15: qty 5

## 2022-08-15 MED ORDER — PROCHLORPERAZINE MALEATE 10 MG PO TABS
10.0000 mg | ORAL_TABLET | Freq: Once | ORAL | Status: AC
  Administered 2022-08-15: 10 mg via ORAL
  Filled 2022-08-15: qty 1

## 2022-08-15 NOTE — Progress Notes (Signed)
Okay to treat with SCr 1.81. Will change treatment parameters to okay to treat if SCr < 2 per Dr. Antonieta Pert instructions.

## 2022-08-15 NOTE — Progress Notes (Signed)
Hematology and Oncology Follow Up Visit  Shawn Bauer [facility] [id number] [birth year] 88 y.o. 08/15/2022   Principle Diagnosis:  IgG Kappa myeloma -- high risk due to t(4:21)   Current Therapy:        Velcade/Decadron -- started 10/09/2019, s/p cycle #`31--treatment frequency changed to every other week on 01/21/2021 Xgeva 120 mg IM q 3 months -- next dose on 09/2022                      Interim History:  Shawn Bauer is here today for follow-up and treatment. He is doing well.  He really has no specific complaints.  He is looking forward to Thanksgiving tomorrow.  He will be with his family.  He has had a good appetite.  He has had no problems with bowels or bladder.  He has had no fever.  He has had no rashes.  There is been no leg swelling.  His myeloma studies will be last checked them back in October did not show monoclonal spike.  His IgG level was 649 mg/dL.  The Kappa light chain was 18.5 mg/dL.  He has had no headache.  He has had no swollen lymph nodes.  Currently, I would say his performance status is probably ECOG 1.    Medications:  Allergies as of 08/15/2022   No Known Allergies      Medication List        Accurate as of August 15, 2022 10:09 AM. If you have any questions, ask your nurse or doctor.          amiodarone 200 MG tablet Commonly known as: PACERONE Take 1 tablet (200 mg total) by mouth as directed. Take '200mg'$  daily Monday - Friday only. What changed: additional instructions   aspirin EC 81 MG tablet Take 81 mg by mouth daily. Swallow whole.   atorvastatin 40 MG tablet Commonly known as: LIPITOR TAKE 1 TABLET BY MOUTH DAILY   CINNAMON PO Take 1,000 mg by mouth daily.   clopidogrel 75 MG tablet Commonly known as: PLAVIX TAKE 1 TABLET BY MOUTH EVERY DAY WITH BREAKFAST   cyanocobalamin 1000 MCG tablet Take 1,000 mcg by mouth daily.   melatonin 5 MG Tabs Take 15 mg by mouth as needed.   metoprolol succinate 25 MG 24 hr tablet Commonly  known as: TOPROL-XL Take 12.5 mg by mouth daily.   nitroGLYCERIN 0.4 MG SL tablet Commonly known as: NITROSTAT Place 1 tablet (0.4 mg total) under the tongue every 5 (five) minutes as needed for chest pain.   olmesartan 40 MG tablet Commonly known as: BENICAR Take 40 mg by mouth in the morning.   ondansetron 8 MG tablet Commonly known as: ZOFRAN Take 1 tablet (8 mg total) by mouth every 8 (eight) hours as needed for nausea or vomiting.   polyethylene glycol 17 g packet Commonly known as: MIRALAX / GLYCOLAX Take 17 g by mouth daily.   Symbicort 160-4.5 MCG/ACT inhaler Generic drug: budesonide-formoterol Inhale 2 puffs into the lungs 2 (two) times daily.   traMADol 50 MG tablet Commonly known as: ULTRAM Take 50 mg by mouth 2 (two) times daily as needed (for pain).        Allergies: No Known Allergies  Past Medical History, Surgical history, Social history, and Family History were reviewed and updated.  Review of Systems: Review of Systems  Constitutional: Negative.   HENT: Negative.    Eyes: Negative.   Respiratory: Negative.    Cardiovascular: Negative.  Gastrointestinal: Negative.   Genitourinary: Negative.   Musculoskeletal: Negative.   Skin: Negative.   Neurological: Negative.   Endo/Heme/Allergies: Negative.   Psychiatric/Behavioral: Negative.       Physical Exam:  height is '5\' 10"'$  (1.778 m) and weight is 140 lb (63.5 kg). His oral temperature is 98.2 F (36.8 C). His blood pressure is 137/49 (abnormal) and his pulse is 73. His respiration is 18 and oxygen saturation is 100%.   Wt Readings from Last 3 Encounters:  08/15/22 140 lb (63.5 kg)  08/08/22 141 lb (64 kg)  07/18/22 139 lb 6.4 oz (63.2 kg)   Physical Exam Vitals reviewed.  HENT:     Head: Normocephalic and atraumatic.  Eyes:     Pupils: Pupils are equal, round, and reactive to light.  Cardiovascular:     Rate and Rhythm: Normal rate and regular rhythm.     Heart sounds: Normal heart  sounds.  Pulmonary:     Effort: Pulmonary effort is normal.     Breath sounds: Normal breath sounds.  Abdominal:     General: Bowel sounds are normal.     Palpations: Abdomen is soft.  Musculoskeletal:        General: No tenderness or deformity. Normal range of motion.     Cervical back: Normal range of motion.  Lymphadenopathy:     Cervical: No cervical adenopathy.  Skin:    General: Skin is warm and dry.     Findings: No erythema or rash.  Neurological:     Mental Status: He is alert and oriented to person, place, and time.  Psychiatric:        Behavior: Behavior normal.        Thought Content: Thought content normal.        Judgment: Judgment normal.      Lab Results  Component Value Date   WBC 7.5 08/15/2022   HGB 11.6 (L) 08/15/2022   HCT 36.7 (L) 08/15/2022   MCV 101.1 (H) 08/15/2022   PLT 183 08/15/2022   Lab Results  Component Value Date   FERRITIN 11 (L) 04/12/2022   IRON 36 (L) 04/12/2022   TIBC 330 04/12/2022   UIBC 294 04/12/2022   IRONPCTSAT 11 (L) 04/12/2022   Lab Results  Component Value Date   RETICCTPCT 1.9 04/12/2022   RBC 3.63 (L) 08/15/2022   Lab Results  Component Value Date   KPAFRELGTCHN 185.0 (H) 07/18/2022   LAMBDASER 9.5 07/18/2022   KAPLAMBRATIO 19.47 (H) 07/18/2022   Lab Results  Component Value Date   IGGSERUM 649 07/18/2022   IGA 118 07/18/2022   IGMSERUM 36 07/18/2022   Lab Results  Component Value Date   TOTALPROTELP 5.5 (L) 07/18/2022   ALBUMINELP 3.3 07/18/2022   A1GS 0.2 07/18/2022   A2GS 0.7 07/18/2022   BETS 0.7 07/18/2022   GAMS 0.6 07/18/2022   MSPIKE Not Observed 07/18/2022   SPEI Comment 07/18/2022     Chemistry      Component Value Date/Time   NA 143 08/15/2022 0901   K 4.0 08/15/2022 0901   CL 106 08/15/2022 0901   CO2 30 08/15/2022 0901   BUN 25 (H) 08/15/2022 0901   CREATININE 1.81 (H) 08/15/2022 0901      Component Value Date/Time   CALCIUM 9.3 08/15/2022 0901   ALKPHOS 49 08/15/2022 0901    AST 14 (L) 08/15/2022 0901   ALT 13 08/15/2022 0901   BILITOT 0.7 08/15/2022 0901       Impression and Plan:  Shawn Bauer is a very pleasant 88 yo caucasian gentleman with IgG kappa myeloma.  He really has done quite nicely.  His myeloma has been under very good control.  He is tolerated treatment without toxicity.  We will continue to treat him every 2 weeks.  If he continues to stabilize, then may we go every 3-week treatments.  I just happy that his quality life is so good.  I will plan to see him back myself in another 4 weeks.    [name], MD 11/22/202310:09 AM

## 2022-08-15 NOTE — Patient Instructions (Signed)
Shawn Bauer AT [facility]  Discharge Instructions: Thank you for choosing [facility] to provide your oncology and hematology care.   If you have a lab appointment with the [facility], please go directly to the [facility] and check in at the registration area.  Wear comfortable clothing and clothing appropriate for easy access to any Portacath or PICC line.   We strive to give you quality time with your provider. You may need to reschedule your appointment if you arrive late (15 or more minutes).  Arriving late affects you and other patients whose appointments are after yours.  Also, if you miss three or more appointments without notifying the office, you may be dismissed from the clinic at the provider's discretion.      For prescription refill requests, have your pharmacy contact our office and allow 72 hours for refills to be completed.    Today you received the following chemotherapy and/or immunotherapy agents Velcade.   To help prevent nausea and vomiting after your treatment, we encourage you to take your nausea medication as directed.  BELOW ARE SYMPTOMS THAT SHOULD BE REPORTED IMMEDIATELY: *FEVER GREATER THAN 100.4 F (38 C) OR HIGHER *CHILLS OR SWEATING *NAUSEA AND VOMITING THAT IS NOT CONTROLLED WITH YOUR NAUSEA MEDICATION *UNUSUAL SHORTNESS OF BREATH *UNUSUAL BRUISING OR BLEEDING *URINARY PROBLEMS (pain or burning when urinating, or frequent urination) *BOWEL PROBLEMS (unusual diarrhea, constipation, pain near the anus) TENDERNESS IN MOUTH AND THROAT WITH OR WITHOUT PRESENCE OF ULCERS (sore throat, sores in mouth, or a toothache) UNUSUAL RASH, SWELLING OR PAIN  UNUSUAL VAGINAL DISCHARGE OR ITCHING   Items with * indicate a potential emergency and should be followed up as soon as possible or go to the Emergency Department if any problems should occur.  Please show the CHEMOTHERAPY ALERT CARD or IMMUNOTHERAPY ALERT CARD at check-in to the  Emergency Department and triage nurse. Should you have questions after your visit or need to cancel or reschedule your appointment, please contact [facility]  [phone] and follow the prompts.  Office hours are 8:00 a.m. to 4:30 p.m. Monday - Friday. Please note that voicemails left after 4:00 p.m. may not be returned until the following business day.  We are closed weekends and major holidays. You have access to a nurse at all times for urgent questions. Please call the main number to the clinic [phone] and follow the prompts.  For any non-urgent questions, you may also contact your provider using MyChart. We now offer e-Visits for anyone 73 and older to request care online for non-urgent symptoms. For details visit [email or web address].   Also download the MyChart app! Go to the app store, search "MyChart", open the app, select [facility], and log in with your MyChart username and password.  Masks are optional in the cancer centers. If you would like for your care team to wear a mask while they are taking care of you, please let them know. You may have one support person who is at least 86 years old accompany you for your appointments.

## 2022-08-16 ENCOUNTER — Other Ambulatory Visit: Payer: Self-pay

## 2022-08-16 LAB — TSH: TSH: 1.96 u[IU]/mL (ref 0.450–4.500)

## 2022-08-17 LAB — IGG, IGA, IGM
IgA: 124 mg/dL (ref 61–437)
IgG (Immunoglobin G), Serum: 654 mg/dL (ref 603–1613)
IgM (Immunoglobulin M), Srm: 36 mg/dL (ref 15–143)

## 2022-08-17 LAB — KAPPA/LAMBDA LIGHT CHAINS
Kappa free light chain: 206.5 mg/L — ABNORMAL HIGH (ref 3.3–19.4)
Kappa, lambda light chain ratio: 22.69 — ABNORMAL HIGH (ref 0.26–1.65)
Lambda free light chains: 9.1 mg/L (ref 5.7–26.3)

## 2022-08-21 LAB — PROTEIN ELECTROPHORESIS, SERUM
A/G Ratio: 1.4 (ref 0.7–1.7)
Albumin ELP: 3.3 g/dL (ref 2.9–4.4)
Alpha-1-Globulin: 0.2 g/dL (ref 0.0–0.4)
Alpha-2-Globulin: 0.7 g/dL (ref 0.4–1.0)
Beta Globulin: 0.8 g/dL (ref 0.7–1.3)
Gamma Globulin: 0.6 g/dL (ref 0.4–1.8)
Globulin, Total: 2.3 g/dL (ref 2.2–3.9)
Total Protein ELP: 5.6 g/dL — ABNORMAL LOW (ref 6.0–8.5)

## 2022-08-22 ENCOUNTER — Other Ambulatory Visit: Payer: Self-pay | Admitting: Cardiovascular Disease

## 2022-08-23 ENCOUNTER — Other Ambulatory Visit: Payer: Self-pay

## 2022-08-29 ENCOUNTER — Inpatient Hospital Stay: Payer: Medicare Other | Attending: Family

## 2022-08-29 ENCOUNTER — Inpatient Hospital Stay: Payer: Medicare Other

## 2022-08-29 VITALS — BP 157/57 | HR 82 | Temp 98.1°F | Resp 16

## 2022-08-29 DIAGNOSIS — C9000 Multiple myeloma not having achieved remission: Secondary | ICD-10-CM | POA: Insufficient documentation

## 2022-08-29 DIAGNOSIS — Z5111 Encounter for antineoplastic chemotherapy: Secondary | ICD-10-CM | POA: Diagnosis present

## 2022-08-29 LAB — CBC WITH DIFFERENTIAL (CANCER CENTER ONLY)
Abs Immature Granulocytes: 0.04 10*3/uL (ref 0.00–0.07)
Basophils Absolute: 0.1 10*3/uL (ref 0.0–0.1)
Basophils Relative: 1 %
Eosinophils Absolute: 0.3 10*3/uL (ref 0.0–0.5)
Eosinophils Relative: 4 %
HCT: 37.2 % — ABNORMAL LOW (ref 39.0–52.0)
Hemoglobin: 11.7 g/dL — ABNORMAL LOW (ref 13.0–17.0)
Immature Granulocytes: 1 %
Lymphocytes Relative: 25 %
Lymphs Abs: 1.9 10*3/uL (ref 0.7–4.0)
MCH: 31.5 pg (ref 26.0–34.0)
MCHC: 31.5 g/dL (ref 30.0–36.0)
MCV: 100 fL (ref 80.0–100.0)
Monocytes Absolute: 0.9 10*3/uL (ref 0.1–1.0)
Monocytes Relative: 11 %
Neutro Abs: 4.5 10*3/uL (ref 1.7–7.7)
Neutrophils Relative %: 58 %
Platelet Count: 186 10*3/uL (ref 150–400)
RBC: 3.72 MIL/uL — ABNORMAL LOW (ref 4.22–5.81)
RDW: 14.5 % (ref 11.5–15.5)
WBC Count: 7.7 10*3/uL (ref 4.0–10.5)
nRBC: 0 % (ref 0.0–0.2)

## 2022-08-29 LAB — CMP (CANCER CENTER ONLY)
ALT: 12 U/L (ref 0–44)
AST: 13 U/L — ABNORMAL LOW (ref 15–41)
Albumin: 3.8 g/dL (ref 3.5–5.0)
Alkaline Phosphatase: 47 U/L (ref 38–126)
Anion gap: 6 (ref 5–15)
BUN: 24 mg/dL — ABNORMAL HIGH (ref 8–23)
CO2: 30 mmol/L (ref 22–32)
Calcium: 8.9 mg/dL (ref 8.9–10.3)
Chloride: 105 mmol/L (ref 98–111)
Creatinine: 1.73 mg/dL — ABNORMAL HIGH (ref 0.61–1.24)
GFR, Estimated: 38 mL/min — ABNORMAL LOW (ref >60)
Glucose, Bld: 123 mg/dL — ABNORMAL HIGH (ref 70–99)
Potassium: 4.5 mmol/L (ref 3.5–5.1)
Sodium: 141 mmol/L (ref 135–145)
Total Bilirubin: 0.8 mg/dL (ref 0.3–1.2)
Total Protein: 6 g/dL — ABNORMAL LOW (ref 6.5–8.1)

## 2022-08-29 MED ORDER — DEXAMETHASONE 4 MG PO TABS
20.0000 mg | ORAL_TABLET | Freq: Once | ORAL | Status: AC
  Administered 2022-08-29: 20 mg via ORAL
  Filled 2022-08-29: qty 5

## 2022-08-29 MED ORDER — PROCHLORPERAZINE MALEATE 10 MG PO TABS
10.0000 mg | ORAL_TABLET | Freq: Once | ORAL | Status: AC
  Administered 2022-08-29: 10 mg via ORAL
  Filled 2022-08-29: qty 1

## 2022-08-29 MED ORDER — BORTEZOMIB CHEMO SQ INJECTION 3.5 MG (2.5MG/ML)
1.3000 mg/m2 | Freq: Once | INTRAMUSCULAR | Status: AC
  Administered 2022-08-29: 2.25 mg via SUBCUTANEOUS
  Filled 2022-08-29: qty 0.9

## 2022-08-29 NOTE — Progress Notes (Signed)
Ok to treat with creatinine of 1.73 per Dr Marin Olp. dph

## 2022-08-29 NOTE — Patient Instructions (Signed)
[facility] [place]  Discharge Instructions: Thank you for choosing [facility] to provide your oncology and hematology care.   If you have a lab appointment with the [facility], please go directly to the [facility] and check in at the registration area.  Wear comfortable clothing and clothing appropriate for easy access to any Portacath or PICC line.   We strive to give you quality time with your provider. You may need to reschedule your appointment if you arrive late (15 or more minutes).  Arriving late affects you and other patients whose appointments are after yours.  Also, if you miss three or more appointments without notifying the office, you may be dismissed from the clinic at the provider's discretion.      For prescription refill requests, have your pharmacy contact our office and allow 72 hours for refills to be completed.    Today you received the following chemotherapy and/or immunotherapy agents Velcade      To help prevent nausea and vomiting after your treatment, we encourage you to take your nausea medication as directed.  BELOW ARE SYMPTOMS THAT SHOULD BE REPORTED IMMEDIATELY: *FEVER GREATER THAN 100.4 F (38 C) OR HIGHER *CHILLS OR SWEATING *NAUSEA AND VOMITING THAT IS NOT CONTROLLED WITH YOUR NAUSEA MEDICATION *UNUSUAL SHORTNESS OF BREATH *UNUSUAL BRUISING OR BLEEDING *URINARY PROBLEMS (pain or burning when urinating, or frequent urination) *BOWEL PROBLEMS (unusual diarrhea, constipation, pain near the anus) TENDERNESS IN MOUTH AND THROAT WITH OR WITHOUT PRESENCE OF ULCERS (sore throat, sores in mouth, or a toothache) UNUSUAL RASH, SWELLING OR PAIN  UNUSUAL VAGINAL DISCHARGE OR ITCHING   Items with * indicate a potential emergency and should be followed up as soon as possible or go to the Emergency Department if any problems should occur.  Please show the CHEMOTHERAPY ALERT CARD or IMMUNOTHERAPY ALERT CARD at check-in to the  Emergency Department and triage nurse. Should you have questions after your visit or need to cancel or reschedule your appointment, please contact [facility]  [phone] and follow the prompts.  Office hours are 8:00 a.m. to 4:30 p.m. Monday - Friday. Please note that voicemails left after 4:00 p.m. may not be returned until the following business day.  We are closed weekends and major holidays. You have access to a nurse at all times for urgent questions. Please call the main number to the clinic [phone] and follow the prompts.  For any non-urgent questions, you may also contact your provider using MyChart. We now offer e-Visits for anyone 37 and older to request care online for non-urgent symptoms. For details visit [email or web address].   Also download the MyChart app! Go to the app store, search "MyChart", open the app, select [facility], and log in with your MyChart username and password.  Masks are optional in the cancer centers. If you would like for your care team to wear a mask while they are taking care of you, please let them know. You may have one support person who is at least 86 years old accompany you for your appointments.

## 2022-09-06 ENCOUNTER — Other Ambulatory Visit: Payer: Self-pay

## 2022-09-12 ENCOUNTER — Telehealth: Payer: Self-pay

## 2022-09-12 ENCOUNTER — Inpatient Hospital Stay: Payer: Medicare Other

## 2022-09-12 ENCOUNTER — Inpatient Hospital Stay (HOSPITAL_BASED_OUTPATIENT_CLINIC_OR_DEPARTMENT_OTHER): Payer: Medicare Other | Admitting: Hematology & Oncology

## 2022-09-12 ENCOUNTER — Encounter: Payer: Self-pay | Admitting: Hematology & Oncology

## 2022-09-12 VITALS — BP 140/56 | HR 65 | Resp 18

## 2022-09-12 VITALS — BP 141/53 | HR 67 | Temp 98.2°F | Resp 20 | Ht 70.0 in | Wt 141.1 lb

## 2022-09-12 DIAGNOSIS — N183 Chronic kidney disease, stage 3 unspecified: Secondary | ICD-10-CM

## 2022-09-12 DIAGNOSIS — C9000 Multiple myeloma not having achieved remission: Secondary | ICD-10-CM

## 2022-09-12 DIAGNOSIS — Z5111 Encounter for antineoplastic chemotherapy: Secondary | ICD-10-CM | POA: Diagnosis not present

## 2022-09-12 LAB — CMP (CANCER CENTER ONLY)
ALT: 11 U/L (ref 0–44)
AST: 12 U/L — ABNORMAL LOW (ref 15–41)
Albumin: 4 g/dL (ref 3.5–5.0)
Alkaline Phosphatase: 48 U/L (ref 38–126)
Anion gap: 8 (ref 5–15)
BUN: 22 mg/dL (ref 8–23)
CO2: 29 mmol/L (ref 22–32)
Calcium: 8.5 mg/dL — ABNORMAL LOW (ref 8.9–10.3)
Chloride: 105 mmol/L (ref 98–111)
Creatinine: 1.7 mg/dL — ABNORMAL HIGH (ref 0.61–1.24)
GFR, Estimated: 39 mL/min — ABNORMAL LOW (ref >60)
Glucose, Bld: 150 mg/dL — ABNORMAL HIGH (ref 70–99)
Potassium: 4.2 mmol/L (ref 3.5–5.1)
Sodium: 142 mmol/L (ref 135–145)
Total Bilirubin: 0.7 mg/dL (ref 0.3–1.2)
Total Protein: 6.1 g/dL — ABNORMAL LOW (ref 6.5–8.1)

## 2022-09-12 LAB — CBC WITH DIFFERENTIAL (CANCER CENTER ONLY)
Abs Immature Granulocytes: 0.04 10*3/uL (ref 0.00–0.07)
Basophils Absolute: 0.1 10*3/uL (ref 0.0–0.1)
Basophils Relative: 1 %
Eosinophils Absolute: 0.3 10*3/uL (ref 0.0–0.5)
Eosinophils Relative: 3 %
HCT: 36.5 % — ABNORMAL LOW (ref 39.0–52.0)
Hemoglobin: 11.6 g/dL — ABNORMAL LOW (ref 13.0–17.0)
Immature Granulocytes: 1 %
Lymphocytes Relative: 19 %
Lymphs Abs: 1.5 10*3/uL (ref 0.7–4.0)
MCH: 31.7 pg (ref 26.0–34.0)
MCHC: 31.8 g/dL (ref 30.0–36.0)
MCV: 99.7 fL (ref 80.0–100.0)
Monocytes Absolute: 0.8 10*3/uL (ref 0.1–1.0)
Monocytes Relative: 10 %
Neutro Abs: 5.2 10*3/uL (ref 1.7–7.7)
Neutrophils Relative %: 66 %
Platelet Count: 188 10*3/uL (ref 150–400)
RBC: 3.66 MIL/uL — ABNORMAL LOW (ref 4.22–5.81)
RDW: 14.4 % (ref 11.5–15.5)
WBC Count: 7.9 10*3/uL (ref 4.0–10.5)
nRBC: 0 % (ref 0.0–0.2)

## 2022-09-12 LAB — URIC ACID: Uric Acid, Serum: 3.2 mg/dL — ABNORMAL LOW (ref 3.7–8.6)

## 2022-09-12 LAB — LACTATE DEHYDROGENASE: LDH: 349 U/L — ABNORMAL HIGH (ref 98–192)

## 2022-09-12 MED ORDER — BORTEZOMIB CHEMO SQ INJECTION 3.5 MG (2.5MG/ML)
1.3000 mg/m2 | Freq: Once | INTRAMUSCULAR | Status: AC
  Administered 2022-09-12: 2.25 mg via SUBCUTANEOUS
  Filled 2022-09-12: qty 0.9

## 2022-09-12 MED ORDER — DEXAMETHASONE 4 MG PO TABS
20.0000 mg | ORAL_TABLET | Freq: Once | ORAL | Status: AC
  Administered 2022-09-12: 20 mg via ORAL
  Filled 2022-09-12: qty 5

## 2022-09-12 MED ORDER — PROCHLORPERAZINE MALEATE 10 MG PO TABS
10.0000 mg | ORAL_TABLET | Freq: Once | ORAL | Status: AC
  Administered 2022-09-12: 10 mg via ORAL
  Filled 2022-09-12: qty 1

## 2022-09-12 NOTE — Telephone Encounter (Signed)
Called and informed patient of lab results, patient verbalized understanding and denies any questions or concerns at this time.   

## 2022-09-12 NOTE — Progress Notes (Signed)
Hematology and Oncology Follow Up Visit  [facility] [id number] 28-Jun-1934 88 y.o. 09/12/2022   Principle Diagnosis:  IgG Kappa myeloma -- high risk due to t(4:21)   Current Therapy:        Velcade/Decadron -- started 10/09/2019, s/p cycle #`32--treatment frequency changed to every other week on 01/21/2021 Xgeva 120 mg IM q 3 months -- next dose on 09/2022                      Interim History:  Shawn Bauer is here today for follow-up and treatment.  His only complaint has been a little bit of swelling in the knuckles of the left hand.  This is the third fourth and fifth MCP joint.  I will check a uric acid on him.  I do not know if this might be gout.  He is not responding like would have wanted with respect to his light chains.  His Kappa light chain still are little on the high side.  His renal function is not any worse.  In fact, his renal function is little bit better.   His last Kappa light chain was 20.1 mg/dL.  He has had no problems with nausea or vomiting.  He has had no change in bowel or bladder habits.  He has had no cough or shortness of breath.  He has had no bleeding.  He does have a lot of ecchymoses.  I think he is on aspirin and Plavix.  There has been no headache.  He has had no bleeding.  His last monoclonal spike was not found.  His IgG level was 654 mg/dL.  Currently, I would say his performance status is probably Shawn Bauer 1.    Medications:  Allergies as of 09/12/2022   No Known Allergies      Medication List        Accurate as of September 12, 2022  9:33 AM. If you have any questions, ask your nurse or doctor.          amiodarone 200 MG tablet Commonly known as: PACERONE Take 1 tablet (200 mg total) by mouth as directed. Take '200mg'$  daily Monday - Friday only. What changed: additional instructions   aspirin EC 81 MG tablet Take 81 mg by mouth daily. Swallow whole.   atorvastatin 40 MG tablet Commonly known as: LIPITOR TAKE 1 TABLET BY MOUTH  DAILY   Breo Ellipta 200-25 MCG/ACT Aepb Generic drug: fluticasone furoate-vilanterol 1 puff daily.   CINNAMON PO Take 1,000 mg by mouth daily.   clopidogrel 75 MG tablet Commonly known as: PLAVIX TAKE 1 TABLET BY MOUTH EVERY DAY WITH BREAKFAST   cyanocobalamin 1000 MCG tablet Take 1,000 mcg by mouth daily.   melatonin 5 MG Tabs Take 15 mg by mouth as needed.   metoprolol succinate 25 MG 24 hr tablet Commonly known as: TOPROL-XL Take 12.5 mg by mouth daily.   metoprolol succinate 50 MG 24 hr tablet Commonly known as: TOPROL-XL Take 50 mg by mouth.   nitroGLYCERIN 0.4 MG SL tablet Commonly known as: NITROSTAT Place 1 tablet (0.4 mg total) under the tongue every 5 (five) minutes as needed for chest pain.   olmesartan 40 MG tablet Commonly known as: BENICAR Take 40 mg by mouth in the morning.   ondansetron 8 MG tablet Commonly known as: ZOFRAN Take 1 tablet (8 mg total) by mouth every 8 (eight) hours as needed for nausea or vomiting.   polyethylene glycol 17 g packet Commonly  known as: MIRALAX / GLYCOLAX Take 17 g by mouth daily.   Symbicort 160-4.5 MCG/ACT inhaler Generic drug: budesonide-formoterol Inhale 2 puffs into the lungs 2 (two) times daily.   traMADol 50 MG tablet Commonly known as: ULTRAM Take 50 mg by mouth 2 (two) times daily as needed (for pain).        Allergies: No Known Allergies  Past Medical History, Surgical history, Social history, and Family History were reviewed and updated.  Review of Systems: Review of Systems  Constitutional: Negative.   HENT: Negative.    Eyes: Negative.   Respiratory: Negative.    Cardiovascular: Negative.   Gastrointestinal: Negative.   Genitourinary: Negative.   Musculoskeletal: Negative.   Skin: Negative.   Neurological: Negative.   Endo/Heme/Allergies: Negative.   Psychiatric/Behavioral: Negative.       Physical Exam:  height is '5\' 10"'$  (1.778 m) and weight is 141 lb 1.9 oz (64 kg). His oral  temperature is 98.2 F (36.8 C). His blood pressure is 141/53 (abnormal) and his pulse is 67. His respiration is 20 and oxygen saturation is 97%.   Wt Readings from Last 3 Encounters:  09/12/22 141 lb 1.9 oz (64 kg)  08/15/22 140 lb (63.5 kg)  08/08/22 141 lb (64 kg)   Physical Exam Vitals reviewed.  HENT:     Head: Normocephalic and atraumatic.  Eyes:     Pupils: Pupils are equal, round, and reactive to light.  Cardiovascular:     Rate and Rhythm: Normal rate and regular rhythm.     Heart sounds: Normal heart sounds.  Pulmonary:     Effort: Pulmonary effort is normal.     Breath sounds: Normal breath sounds.  Abdominal:     General: Bowel sounds are normal.     Palpations: Abdomen is soft.  Musculoskeletal:        General: No tenderness or deformity. Normal range of motion.     Cervical back: Normal range of motion.  Lymphadenopathy:     Cervical: No cervical adenopathy.  Skin:    General: Skin is warm and dry.     Findings: No erythema or rash.  Neurological:     Mental Status: He is alert and oriented to person, place, and time.  Psychiatric:        Behavior: Behavior normal.        Thought Content: Thought content normal.        Judgment: Judgment normal.      Lab Results  Component Value Date   WBC 7.9 09/12/2022   HGB 11.6 (L) 09/12/2022   HCT 36.5 (L) 09/12/2022   MCV 99.7 09/12/2022   PLT 188 09/12/2022   Lab Results  Component Value Date   FERRITIN 11 (L) 04/12/2022   IRON 36 (L) 04/12/2022   TIBC 330 04/12/2022   UIBC 294 04/12/2022   IRONPCTSAT 11 (L) 04/12/2022   Lab Results  Component Value Date   RETICCTPCT 1.9 04/12/2022   RBC 3.66 (L) 09/12/2022   Lab Results  Component Value Date   KPAFRELGTCHN 206.5 (H) 08/15/2022   LAMBDASER 9.1 08/15/2022   KAPLAMBRATIO 22.69 (H) 08/15/2022   Lab Results  Component Value Date   IGGSERUM 654 08/15/2022   IGA 124 08/15/2022   IGMSERUM 36 08/15/2022   Lab Results  Component Value Date    TOTALPROTELP 5.6 (L) 08/15/2022   ALBUMINELP 3.3 08/15/2022   A1GS 0.2 08/15/2022   A2GS 0.7 08/15/2022   BETS 0.8 08/15/2022   GAMS 0.6 08/15/2022  MSPIKE Not Observed 08/15/2022   SPEI Comment 08/15/2022     Chemistry      Component Value Date/Time   NA 141 08/29/2022 0906   K 4.5 08/29/2022 0906   CL 105 08/29/2022 0906   CO2 30 08/29/2022 0906   BUN 24 (H) 08/29/2022 0906   CREATININE 1.73 (H) 08/29/2022 0906      Component Value Date/Time   CALCIUM 8.9 08/29/2022 0906   ALKPHOS 47 08/29/2022 0906   AST 13 (L) 08/29/2022 0906   ALT 12 08/29/2022 0906   BILITOT 0.8 08/29/2022 0906       Impression and Plan: Mr. Warne is a very pleasant 88 yo caucasian gentleman with IgG kappa myeloma.  Again, I just wish that his Kappa light chain will come down a little bit better.  We will see what today's level is.  We will check a uric acid on him.  His uric acid is on the high side, we will get him on something for the possibility of gout.  The really has not swelling in the joints that are affected.  He has little bit of erythema over the skin.  We will go ahead with his treatment today.  If we find out the light chains going up, then we may have to think about adding daratumumab.  We will plan to get her back in another couple weeks.     [name], MD 12/20/20239:33 AM

## 2022-09-12 NOTE — Patient Instructions (Signed)
Bortezomib Injection What is this medication? BORTEZOMIB (bor TEZ oh mib) treats lymphoma. It may also be used to treat multiple myeloma, a type of bone marrow cancer. It works by blocking a protein that causes cancer cells to grow and multiply. This helps to slow or stop the spread of cancer cells. This medicine may be used for other purposes; ask your health care provider or pharmacist if you have questions. COMMON BRAND NAME(S): Velcade What should I tell my care team before I take this medication? They need to know if you have any of these conditions: Dehydration Diabetes Heart disease Liver disease Tingling of the fingers or toes or other nerve disorder An unusual or allergic reaction to bortezomib, other medications, foods, dyes, or preservatives If you or your partner are pregnant or trying to get pregnant Breastfeeding How should I use this medication? This medication is injected into a vein or under the skin. It is given by your care team in a hospital or clinic setting. Talk to your care team about the use of this medication in children. Special care may be needed. Overdosage: If you think you have taken too much of this medicine contact a poison control center or emergency room at once. NOTE: This medicine is only for you. Do not share this medicine with others. What if I miss a dose? Keep appointments for follow-up doses. It is important not to miss your dose. Call your care team if you are unable to keep an appointment. What may interact with this medication? Ketoconazole Rifampin This list may not describe all possible interactions. Give your health care provider a list of all the medicines, herbs, non-prescription drugs, or dietary supplements you use. Also tell them if you smoke, drink alcohol, or use illegal drugs. Some items may interact with your medicine. What should I watch for while using this medication? Your condition will be monitored carefully while you are  receiving this medication. You may need blood work while taking this medication. This medication may affect your coordination, reaction time, or judgment. Do not drive or operate machinery until you know how this medication affects you. Sit up or stand slowly to reduce the risk of dizzy or fainting spells. Drinking alcohol with this medication can increase the risk of these side effects. This medication may increase your risk of getting an infection. Call your care team for advice if you get a fever, chills, sore throat, or other symptoms of a cold or flu. Do not treat yourself. Try to avoid being around people who are sick. Check with your care team if you have severe diarrhea, nausea, and vomiting, or if you sweat a lot. The loss of too much body fluid may make it dangerous for you to take this medication. Talk to your care team if you may be pregnant. Serious birth defects can occur if you take this medication during pregnancy and for 7 months after the last dose. You will need a negative pregnancy test before starting this medication. Contraception is recommended while taking this medication and for 7 months after the last dose. Your care team can help you find the option that works for you. If your partner can get pregnant, use a condom during sex while taking this medication and for 4 months after the last dose. Do not breastfeed while taking this medication and for 2 months after the last dose. This medication may cause infertility. Talk to your care team if you are concerned about your fertility. What side effects   may I notice from receiving this medication? Side effects that you should report to your care team as soon as possible: Allergic reactions--skin rash, itching, hives, swelling of the face, lips, tongue, or throat Bleeding--bloody or black, tar-like stools, vomiting blood or brown material that looks like coffee grounds, red or dark brown urine, small red or purple spots on skin, unusual  bruising or bleeding Bleeding in the brain--severe headache, stiff neck, confusion, dizziness, change in vision, numbness or weakness of the face, arm, or leg, trouble speaking, trouble walking, vomiting Bowel blockage--stomach cramping, unable to have a bowel movement or pass gas, loss of appetite, vomiting Heart failure--shortness of breath, swelling of the ankles, feet, or hands, sudden weight gain, unusual weakness or fatigue Infection--fever, chills, cough, sore throat, wounds that don't heal, pain or trouble when passing urine, general feeling of discomfort or being unwell Liver injury--right upper belly pain, loss of appetite, nausea, light-colored stool, dark yellow or brown urine, yellowing skin or eyes, unusual weakness or fatigue Low blood pressure--dizziness, feeling faint or lightheaded, blurry vision Lung injury--shortness of breath or trouble breathing, cough, spitting up blood, chest pain, fever Pain, tingling, or numbness in the hands or feet Severe or prolonged diarrhea Stomach pain, bloody diarrhea, pale skin, unusual weakness or fatigue, decrease in the amount of urine, which may be signs of hemolytic uremic syndrome Sudden and severe headache, confusion, change in vision, seizures, which may be signs of posterior reversible encephalopathy syndrome (PRES) TTP--purple spots on the skin or inside the mouth, pale skin, yellowing skin or eyes, unusual weakness or fatigue, fever, fast or irregular heartbeat, confusion, change in vision, trouble speaking, trouble walking Tumor lysis syndrome (TLS)--nausea, vomiting, diarrhea, decrease in the amount of urine, dark urine, unusual weakness or fatigue, confusion, muscle pain or cramps, fast or irregular heartbeat, joint pain Side effects that usually do not require medical attention (report to your care team if they continue or are bothersome): Constipation Diarrhea Fatigue Loss of appetite Nausea This list may not describe all possible  side effects. Call your doctor for medical advice about side effects. You may report side effects to FDA at 1-800-FDA-1088. Where should I keep my medication? This medication is given in a hospital or clinic. It will not be stored at home. NOTE: This sheet is a summary. It may not cover all possible information. If you have questions about this medicine, talk to your doctor, pharmacist, or health care provider.  2023 Elsevier/Gold Standard (2022-02-07 00:00:00)  

## 2022-09-12 NOTE — Telephone Encounter (Signed)
-----   Message from [name], MD sent at 09/12/2022  2:06 PM EST ----- Please call and let him know that the uric acid level is fine.  As such I do not believe he has gout.

## 2022-09-13 ENCOUNTER — Other Ambulatory Visit: Payer: Self-pay

## 2022-09-13 LAB — IGG, IGA, IGM
IgA: 119 mg/dL (ref 61–437)
IgG (Immunoglobin G), Serum: 633 mg/dL (ref 603–1613)
IgM (Immunoglobulin M), Srm: 33 mg/dL (ref 15–143)

## 2022-09-13 LAB — KAPPA/LAMBDA LIGHT CHAINS
Kappa free light chain: 177 mg/L — ABNORMAL HIGH (ref 3.3–19.4)
Kappa, lambda light chain ratio: 22.41 — ABNORMAL HIGH (ref 0.26–1.65)
Lambda free light chains: 7.9 mg/L (ref 5.7–26.3)

## 2022-09-14 LAB — PROTEIN ELECTROPHORESIS, SERUM, WITH REFLEX
A/G Ratio: 1.5 (ref 0.7–1.7)
Albumin ELP: 3.4 g/dL (ref 2.9–4.4)
Alpha-1-Globulin: 0.3 g/dL (ref 0.0–0.4)
Alpha-2-Globulin: 0.7 g/dL (ref 0.4–1.0)
Beta Globulin: 0.8 g/dL (ref 0.7–1.3)
Gamma Globulin: 0.6 g/dL (ref 0.4–1.8)
Globulin, Total: 2.3 g/dL (ref 2.2–3.9)
Total Protein ELP: 5.7 g/dL — ABNORMAL LOW (ref 6.0–8.5)

## 2022-09-19 ENCOUNTER — Other Ambulatory Visit: Payer: Self-pay

## 2022-09-22 ENCOUNTER — Other Ambulatory Visit: Payer: Self-pay

## 2022-09-26 ENCOUNTER — Inpatient Hospital Stay: Payer: Medicare Other | Attending: Family

## 2022-09-26 ENCOUNTER — Inpatient Hospital Stay (HOSPITAL_BASED_OUTPATIENT_CLINIC_OR_DEPARTMENT_OTHER): Payer: Medicare Other | Admitting: Family

## 2022-09-26 ENCOUNTER — Encounter: Payer: Self-pay | Admitting: Family

## 2022-09-26 ENCOUNTER — Inpatient Hospital Stay: Payer: Medicare Other

## 2022-09-26 VITALS — BP 143/64 | HR 67 | Temp 98.1°F | Resp 18 | Wt 140.1 lb

## 2022-09-26 DIAGNOSIS — C9000 Multiple myeloma not having achieved remission: Secondary | ICD-10-CM | POA: Insufficient documentation

## 2022-09-26 DIAGNOSIS — Z5111 Encounter for antineoplastic chemotherapy: Secondary | ICD-10-CM | POA: Insufficient documentation

## 2022-09-26 DIAGNOSIS — N183 Chronic kidney disease, stage 3 unspecified: Secondary | ICD-10-CM

## 2022-09-26 LAB — CMP (CANCER CENTER ONLY)
ALT: 11 U/L (ref 0–44)
AST: 14 U/L — ABNORMAL LOW (ref 15–41)
Albumin: 3.8 g/dL (ref 3.5–5.0)
Alkaline Phosphatase: 49 U/L (ref 38–126)
Anion gap: 6 (ref 5–15)
BUN: 27 mg/dL — ABNORMAL HIGH (ref 8–23)
CO2: 28 mmol/L (ref 22–32)
Calcium: 8.7 mg/dL — ABNORMAL LOW (ref 8.9–10.3)
Chloride: 104 mmol/L (ref 98–111)
Creatinine: 1.83 mg/dL — ABNORMAL HIGH (ref 0.61–1.24)
GFR, Estimated: 35 mL/min — ABNORMAL LOW (ref >60)
Glucose, Bld: 138 mg/dL — ABNORMAL HIGH (ref 70–99)
Potassium: 3.9 mmol/L (ref 3.5–5.1)
Sodium: 138 mmol/L (ref 135–145)
Total Bilirubin: 0.8 mg/dL (ref 0.3–1.2)
Total Protein: 6.1 g/dL — ABNORMAL LOW (ref 6.5–8.1)

## 2022-09-26 LAB — CBC WITH DIFFERENTIAL (CANCER CENTER ONLY)
Abs Immature Granulocytes: 0.04 10*3/uL (ref 0.00–0.07)
Basophils Absolute: 0.1 10*3/uL (ref 0.0–0.1)
Basophils Relative: 1 %
Eosinophils Absolute: 0.3 10*3/uL (ref 0.0–0.5)
Eosinophils Relative: 3 %
HCT: 37.3 % — ABNORMAL LOW (ref 39.0–52.0)
Hemoglobin: 11.7 g/dL — ABNORMAL LOW (ref 13.0–17.0)
Immature Granulocytes: 1 %
Lymphocytes Relative: 17 %
Lymphs Abs: 1.3 10*3/uL (ref 0.7–4.0)
MCH: 31.4 pg (ref 26.0–34.0)
MCHC: 31.4 g/dL (ref 30.0–36.0)
MCV: 100 fL (ref 80.0–100.0)
Monocytes Absolute: 0.8 10*3/uL (ref 0.1–1.0)
Monocytes Relative: 10 %
Neutro Abs: 5.3 10*3/uL (ref 1.7–7.7)
Neutrophils Relative %: 68 %
Platelet Count: 199 10*3/uL (ref 150–400)
RBC: 3.73 MIL/uL — ABNORMAL LOW (ref 4.22–5.81)
RDW: 14.4 % (ref 11.5–15.5)
WBC Count: 7.8 10*3/uL (ref 4.0–10.5)
nRBC: 0 % (ref 0.0–0.2)

## 2022-09-26 LAB — LACTATE DEHYDROGENASE: LDH: 178 U/L (ref 98–192)

## 2022-09-26 MED ORDER — PROCHLORPERAZINE MALEATE 10 MG PO TABS
10.0000 mg | ORAL_TABLET | Freq: Once | ORAL | Status: AC
  Administered 2022-09-26: 10 mg via ORAL
  Filled 2022-09-26: qty 1

## 2022-09-26 MED ORDER — DENOSUMAB 120 MG/1.7ML ~~LOC~~ SOLN
120.0000 mg | Freq: Once | SUBCUTANEOUS | Status: AC
  Administered 2022-09-26: 120 mg via SUBCUTANEOUS
  Filled 2022-09-26: qty 1.7

## 2022-09-26 MED ORDER — DEXAMETHASONE 4 MG PO TABS
20.0000 mg | ORAL_TABLET | Freq: Once | ORAL | Status: AC
  Administered 2022-09-26: 20 mg via ORAL
  Filled 2022-09-26: qty 5

## 2022-09-26 MED ORDER — BORTEZOMIB CHEMO SQ INJECTION 3.5 MG (2.5MG/ML)
1.3000 mg/m2 | Freq: Once | INTRAMUSCULAR | Status: AC
  Administered 2022-09-26: 2.25 mg via SUBCUTANEOUS
  Filled 2022-09-26: qty 0.9

## 2022-09-26 NOTE — Progress Notes (Signed)
Hematology and Oncology Follow Up Visit  Shawn Bauer [id number] [birth year] 89 y.o. 09/26/2022   Principle Diagnosis:  IgG Kappa myeloma -- high risk due to t(4:21)   Current Therapy:        Velcade/Decadron -- started 10/09/2019, s/p cycle 32-- treatment frequency changed to every other week on 01/21/2021 Xgeva 120 mg IM q 3 months -- next dose on 12/2022          Interim History:  Shawn Bauer is here today for follow-up and treatment. He is doing well and has no complaints at this time.  He had a nice holiday with family.  Last month his M-spike was not observed, IgG level was 633 mg/dL and kappa light chains down a bit at 17.70 mg/dL.  No issue with infection. No fever, chills, n/v, cough, rash, dizziness, SOB, chest pain, palpitations, abdominal pain or changes in bowel or bladder habits.  No swelling, tenderness, numbness or tingling in his extremities at this time.  No falls or syncope reported.  No blood loss noted. No bruising or petechiae.  Appetite and hydration are good. Weight is stable at 140 lbs.   ECOG Performance Status: 1 - Symptomatic but completely ambulatory  Medications:  Allergies as of 09/26/2022   No Known Allergies      Medication List        Accurate as of September 26, 2022 10:43 AM. If you have any questions, ask your nurse or doctor.          amiodarone 200 MG tablet Commonly known as: PACERONE Take 1 tablet (200 mg total) by mouth as directed. Take '200mg'$  daily Monday - Friday only. What changed: additional instructions   aspirin EC 81 MG tablet Take 81 mg by mouth daily. Swallow whole.   atorvastatin 40 MG tablet Commonly known as: LIPITOR TAKE 1 TABLET BY MOUTH DAILY   Breo Ellipta 200-25 MCG/ACT Aepb Generic drug: fluticasone furoate-vilanterol 1 puff daily.   CINNAMON PO Take 1,000 mg by mouth daily.   clopidogrel 75 MG tablet Commonly known as: PLAVIX TAKE 1 TABLET BY MOUTH EVERY DAY WITH BREAKFAST   cyanocobalamin 1000 MCG  tablet Take 1,000 mcg by mouth daily.   melatonin 5 MG Tabs Take 15 mg by mouth as needed.   metoprolol succinate 25 MG 24 hr tablet Commonly known as: TOPROL-XL Take 12.5 mg by mouth daily.   metoprolol succinate 50 MG 24 hr tablet Commonly known as: TOPROL-XL Take 25 mg by mouth.   nitroGLYCERIN 0.4 MG SL tablet Commonly known as: NITROSTAT Place 1 tablet (0.4 mg total) under the tongue every 5 (five) minutes as needed for chest pain.   olmesartan 40 MG tablet Commonly known as: BENICAR Take 40 mg by mouth in the morning.   ondansetron 8 MG tablet Commonly known as: ZOFRAN Take 1 tablet (8 mg total) by mouth every 8 (eight) hours as needed for nausea or vomiting.   polyethylene glycol 17 g packet Commonly known as: MIRALAX / GLYCOLAX Take 17 g by mouth daily.   Symbicort 160-4.5 MCG/ACT inhaler Generic drug: budesonide-formoterol Inhale 2 puffs into the lungs 2 (two) times daily.   traMADol 50 MG tablet Commonly known as: ULTRAM Take 50 mg by mouth 2 (two) times daily as needed (for pain).        Allergies: No Known Allergies  Past Medical History, Surgical history, Social history, and Family History were reviewed and updated.  Review of Systems: All other 10 point review of systems is  negative.   Physical Exam:  weight is 140 lb 1.9 oz (63.6 kg). His oral temperature is 98.1 F (36.7 C). His blood pressure is 143/64 (abnormal) and his pulse is 67. His respiration is 18 and oxygen saturation is 98%.   Wt Readings from Last 3 Encounters:  09/26/22 140 lb 1.9 oz (63.6 kg)  09/12/22 141 lb 1.9 oz (64 kg)  08/15/22 140 lb (63.5 kg)    Ocular: Sclerae unicteric, pupils equal, round and reactive to light Ear-nose-throat: Oropharynx clear, dentition fair Lymphatic: No cervical or supraclavicular adenopathy Lungs no rales or rhonchi, good excursion bilaterally Heart regular rate and rhythm, no murmur appreciated Abd soft, nontender, positive bowel sounds MSK  no focal spinal tenderness, no joint edema Neuro: non-focal, well-oriented, appropriate affect Breasts: Deferred   Lab Results  Component Value Date   WBC 7.8 09/26/2022   HGB 11.7 (L) 09/26/2022   HCT 37.3 (L) 09/26/2022   MCV 100.0 09/26/2022   PLT 199 09/26/2022   Lab Results  Component Value Date   FERRITIN 11 (L) 04/12/2022   IRON 36 (L) 04/12/2022   TIBC 330 04/12/2022   UIBC 294 04/12/2022   IRONPCTSAT 11 (L) 04/12/2022   Lab Results  Component Value Date   RETICCTPCT 1.9 04/12/2022   RBC 3.73 (L) 09/26/2022   Lab Results  Component Value Date   KPAFRELGTCHN 177.0 (H) 09/12/2022   LAMBDASER 7.9 09/12/2022   KAPLAMBRATIO 22.41 (H) 09/12/2022   Lab Results  Component Value Date   IGGSERUM 633 09/12/2022   IGA 119 09/12/2022   IGMSERUM 33 09/12/2022   Lab Results  Component Value Date   TOTALPROTELP 5.7 (L) 09/12/2022   ALBUMINELP 3.4 09/12/2022   A1GS 0.3 09/12/2022   A2GS 0.7 09/12/2022   BETS 0.8 09/12/2022   GAMS 0.6 09/12/2022   MSPIKE Not Observed 09/12/2022   SPEI Comment 08/15/2022     Chemistry      Component Value Date/Time   NA 142 09/12/2022 0857   K 4.2 09/12/2022 0857   CL 105 09/12/2022 0857   CO2 29 09/12/2022 0857   BUN 22 09/12/2022 0857   CREATININE 1.70 (H) 09/12/2022 0857      Component Value Date/Time   CALCIUM 8.5 (L) 09/12/2022 0857   ALKPHOS 48 09/12/2022 0857   AST 12 (L) 09/12/2022 0857   ALT 11 09/12/2022 0857   BILITOT 0.7 09/12/2022 0857       Impression and Plan: Shawn Bauer is a very pleasant 88 yo caucasian gentleman with IgG kappa myeloma.   We will proceed with Velcade and Xgeva today as planned.  Lab and treatment every 2 weeks, follow-up in 4 weeks.   Lottie Dawson, NP 1/3/202410:43 AM

## 2022-09-26 NOTE — Patient Instructions (Signed)
[facility] [place]  Discharge Instructions: Thank you for choosing [facility] to provide your oncology and hematology care.   If you have a lab appointment with the [facility], please go directly to the [facility] and check in at the registration area.  Wear comfortable clothing and clothing appropriate for easy access to any Portacath or PICC line.   We strive to give you quality time with your provider. You may need to reschedule your appointment if you arrive late (15 or more minutes).  Arriving late affects you and other patients whose appointments are after yours.  Also, if you miss three or more appointments without notifying the office, you may be dismissed from the clinic at the provider's discretion.      For prescription refill requests, have your pharmacy contact our office and allow 72 hours for refills to be completed.    Today you received the following chemotherapy and/or immunotherapy agents Velcade, Xgeva      To help prevent nausea and vomiting after your treatment, we encourage you to take your nausea medication as directed.  BELOW ARE SYMPTOMS THAT SHOULD BE REPORTED IMMEDIATELY: *FEVER GREATER THAN 100.4 F (38 C) OR HIGHER *CHILLS OR SWEATING *NAUSEA AND VOMITING THAT IS NOT CONTROLLED WITH YOUR NAUSEA MEDICATION *UNUSUAL SHORTNESS OF BREATH *UNUSUAL BRUISING OR BLEEDING *URINARY PROBLEMS (pain or burning when urinating, or frequent urination) *BOWEL PROBLEMS (unusual diarrhea, constipation, pain near the anus) TENDERNESS IN MOUTH AND THROAT WITH OR WITHOUT PRESENCE OF ULCERS (sore throat, sores in mouth, or a toothache) UNUSUAL RASH, SWELLING OR PAIN  UNUSUAL VAGINAL DISCHARGE OR ITCHING   Items with * indicate a potential emergency and should be followed up as soon as possible or go to the Emergency Department if any problems should occur.  Please show the CHEMOTHERAPY ALERT CARD or IMMUNOTHERAPY ALERT CARD at check-in to  the Emergency Department and triage nurse. Should you have questions after your visit or need to cancel or reschedule your appointment, please contact [facility]  [phone] and follow the prompts.  Office hours are 8:00 a.m. to 4:30 p.m. Monday - Friday. Please note that voicemails left after 4:00 p.m. may not be returned until the following business day.  We are closed weekends and major holidays. You have access to a nurse at all times for urgent questions. Please call the main number to the clinic [phone] and follow the prompts.  For any non-urgent questions, you may also contact your provider using MyChart. We now offer e-Visits for anyone 19 and older to request care online for non-urgent symptoms. For details visit [email or web address].   Also download the MyChart app! Go to the app store, search "MyChart", open the app, select [facility], and log in with your MyChart username and password.

## 2022-09-26 NOTE — Progress Notes (Signed)
Ok to treat with creatinine of 1.83 per Judson Roch, NP. dph

## 2022-09-27 ENCOUNTER — Other Ambulatory Visit: Payer: Self-pay

## 2022-09-27 LAB — KAPPA/LAMBDA LIGHT CHAINS
Kappa free light chain: 183.1 mg/L — ABNORMAL HIGH (ref 3.3–19.4)
Kappa, lambda light chain ratio: 20.12 — ABNORMAL HIGH (ref 0.26–1.65)
Lambda free light chains: 9.1 mg/L (ref 5.7–26.3)

## 2022-09-27 LAB — IGG, IGA, IGM
IgA: 115 mg/dL (ref 61–437)
IgG (Immunoglobin G), Serum: 663 mg/dL (ref 603–1613)
IgM (Immunoglobulin M), Srm: 43 mg/dL (ref 15–143)

## 2022-09-28 LAB — PROTEIN ELECTROPHORESIS, SERUM, WITH REFLEX
A/G Ratio: 1.4 (ref 0.7–1.7)
Albumin ELP: 3.4 g/dL (ref 2.9–4.4)
Alpha-1-Globulin: 0.2 g/dL (ref 0.0–0.4)
Alpha-2-Globulin: 0.7 g/dL (ref 0.4–1.0)
Beta Globulin: 0.8 g/dL (ref 0.7–1.3)
Gamma Globulin: 0.7 g/dL (ref 0.4–1.8)
Globulin, Total: 2.4 g/dL (ref 2.2–3.9)
Total Protein ELP: 5.8 g/dL — ABNORMAL LOW (ref 6.0–8.5)

## 2022-10-10 ENCOUNTER — Inpatient Hospital Stay: Payer: Medicare Other

## 2022-10-10 VITALS — BP 142/56 | HR 73 | Temp 98.0°F | Resp 19

## 2022-10-10 DIAGNOSIS — C9000 Multiple myeloma not having achieved remission: Secondary | ICD-10-CM

## 2022-10-10 DIAGNOSIS — Z5111 Encounter for antineoplastic chemotherapy: Secondary | ICD-10-CM | POA: Diagnosis not present

## 2022-10-10 LAB — CMP (CANCER CENTER ONLY)
ALT: 11 U/L (ref 0–44)
AST: 14 U/L — ABNORMAL LOW (ref 15–41)
Albumin: 3.8 g/dL (ref 3.5–5.0)
Alkaline Phosphatase: 42 U/L (ref 38–126)
Anion gap: 7 (ref 5–15)
BUN: 29 mg/dL — ABNORMAL HIGH (ref 8–23)
CO2: 28 mmol/L (ref 22–32)
Calcium: 9 mg/dL (ref 8.9–10.3)
Chloride: 107 mmol/L (ref 98–111)
Creatinine: 1.93 mg/dL — ABNORMAL HIGH (ref 0.61–1.24)
GFR, Estimated: 33 mL/min — ABNORMAL LOW (ref >60)
Glucose, Bld: 167 mg/dL — ABNORMAL HIGH (ref 70–99)
Potassium: 4.9 mmol/L (ref 3.5–5.1)
Sodium: 142 mmol/L (ref 135–145)
Total Bilirubin: 0.9 mg/dL (ref 0.3–1.2)
Total Protein: 6.1 g/dL — ABNORMAL LOW (ref 6.5–8.1)

## 2022-10-10 LAB — CBC WITH DIFFERENTIAL (CANCER CENTER ONLY)
Abs Immature Granulocytes: 0.04 10*3/uL (ref 0.00–0.07)
Basophils Absolute: 0.1 10*3/uL (ref 0.0–0.1)
Basophils Relative: 1 %
Eosinophils Absolute: 0.4 10*3/uL (ref 0.0–0.5)
Eosinophils Relative: 4 %
HCT: 36.7 % — ABNORMAL LOW (ref 39.0–52.0)
Hemoglobin: 11.6 g/dL — ABNORMAL LOW (ref 13.0–17.0)
Immature Granulocytes: 1 %
Lymphocytes Relative: 21 %
Lymphs Abs: 1.8 10*3/uL (ref 0.7–4.0)
MCH: 31.4 pg (ref 26.0–34.0)
MCHC: 31.6 g/dL (ref 30.0–36.0)
MCV: 99.5 fL (ref 80.0–100.0)
Monocytes Absolute: 0.9 10*3/uL (ref 0.1–1.0)
Monocytes Relative: 10 %
Neutro Abs: 5.5 10*3/uL (ref 1.7–7.7)
Neutrophils Relative %: 63 %
Platelet Count: 181 10*3/uL (ref 150–400)
RBC: 3.69 MIL/uL — ABNORMAL LOW (ref 4.22–5.81)
RDW: 14.3 % (ref 11.5–15.5)
WBC Count: 8.5 10*3/uL (ref 4.0–10.5)
nRBC: 0 % (ref 0.0–0.2)

## 2022-10-10 MED ORDER — PROCHLORPERAZINE MALEATE 10 MG PO TABS
10.0000 mg | ORAL_TABLET | Freq: Once | ORAL | Status: AC
  Administered 2022-10-10: 10 mg via ORAL
  Filled 2022-10-10: qty 1

## 2022-10-10 MED ORDER — BORTEZOMIB CHEMO SQ INJECTION 3.5 MG (2.5MG/ML)
1.3000 mg/m2 | Freq: Once | INTRAMUSCULAR | Status: AC
  Administered 2022-10-10: 2.25 mg via SUBCUTANEOUS
  Filled 2022-10-10: qty 0.9

## 2022-10-10 MED ORDER — DEXAMETHASONE 4 MG PO TABS
20.0000 mg | ORAL_TABLET | Freq: Once | ORAL | Status: AC
  Administered 2022-10-10: 20 mg via ORAL
  Filled 2022-10-10: qty 5

## 2022-10-10 NOTE — Patient Instructions (Signed)
Bortezomib Injection What is this medication? BORTEZOMIB (bor TEZ oh mib) treats lymphoma. It may also be used to treat multiple myeloma, a type of bone marrow cancer. It works by blocking a protein that causes cancer cells to grow and multiply. This helps to slow or stop the spread of cancer cells. This medicine may be used for other purposes; ask your health care provider or pharmacist if you have questions. COMMON BRAND NAME(S): Velcade What should I tell my care team before I take this medication? They need to know if you have any of these conditions: Dehydration Diabetes Heart disease Liver disease Tingling of the fingers or toes or other nerve disorder An unusual or allergic reaction to bortezomib, other medications, foods, dyes, or preservatives If you or your partner are pregnant or trying to get pregnant Breastfeeding How should I use this medication? This medication is injected into a vein or under the skin. It is given by your care team in a hospital or clinic setting. Talk to your care team about the use of this medication in children. Special care may be needed. Overdosage: If you think you have taken too much of this medicine contact a poison control center or emergency room at once. NOTE: This medicine is only for you. Do not share this medicine with others. What if I miss a dose? Keep appointments for follow-up doses. It is important not to miss your dose. Call your care team if you are unable to keep an appointment. What may interact with this medication? Ketoconazole Rifampin This list may not describe all possible interactions. Give your health care provider a list of all the medicines, herbs, non-prescription drugs, or dietary supplements you use. Also tell them if you smoke, drink alcohol, or use illegal drugs. Some items may interact with your medicine. What should I watch for while using this medication? Your condition will be monitored carefully while you are  receiving this medication. You may need blood work while taking this medication. This medication may affect your coordination, reaction time, or judgment. Do not drive or operate machinery until you know how this medication affects you. Sit up or stand slowly to reduce the risk of dizzy or fainting spells. Drinking alcohol with this medication can increase the risk of these side effects. This medication may increase your risk of getting an infection. Call your care team for advice if you get a fever, chills, sore throat, or other symptoms of a cold or flu. Do not treat yourself. Try to avoid being around people who are sick. Check with your care team if you have severe diarrhea, nausea, and vomiting, or if you sweat a lot. The loss of too much body fluid may make it dangerous for you to take this medication. Talk to your care team if you may be pregnant. Serious birth defects can occur if you take this medication during pregnancy and for 7 months after the last dose. You will need a negative pregnancy test before starting this medication. Contraception is recommended while taking this medication and for 7 months after the last dose. Your care team can help you find the option that works for you. If your partner can get pregnant, use a condom during sex while taking this medication and for 4 months after the last dose. Do not breastfeed while taking this medication and for 2 months after the last dose. This medication may cause infertility. Talk to your care team if you are concerned about your fertility. What side effects   may I notice from receiving this medication? Side effects that you should report to your care team as soon as possible: Allergic reactions--skin rash, itching, hives, swelling of the face, lips, tongue, or throat Bleeding--bloody or black, tar-like stools, vomiting blood or brown material that looks like coffee grounds, red or dark brown urine, small red or purple spots on skin, unusual  bruising or bleeding Bleeding in the brain--severe headache, stiff neck, confusion, dizziness, change in vision, numbness or weakness of the face, arm, or leg, trouble speaking, trouble walking, vomiting Bowel blockage--stomach cramping, unable to have a bowel movement or pass gas, loss of appetite, vomiting Heart failure--shortness of breath, swelling of the ankles, feet, or hands, sudden weight gain, unusual weakness or fatigue Infection--fever, chills, cough, sore throat, wounds that don't heal, pain or trouble when passing urine, general feeling of discomfort or being unwell Liver injury--right upper belly pain, loss of appetite, nausea, light-colored stool, dark yellow or brown urine, yellowing skin or eyes, unusual weakness or fatigue Low blood pressure--dizziness, feeling faint or lightheaded, blurry vision Lung injury--shortness of breath or trouble breathing, cough, spitting up blood, chest pain, fever Pain, tingling, or numbness in the hands or feet Severe or prolonged diarrhea Stomach pain, bloody diarrhea, pale skin, unusual weakness or fatigue, decrease in the amount of urine, which may be signs of hemolytic uremic syndrome Sudden and severe headache, confusion, change in vision, seizures, which may be signs of posterior reversible encephalopathy syndrome (PRES) TTP--purple spots on the skin or inside the mouth, pale skin, yellowing skin or eyes, unusual weakness or fatigue, fever, fast or irregular heartbeat, confusion, change in vision, trouble speaking, trouble walking Tumor lysis syndrome (TLS)--nausea, vomiting, diarrhea, decrease in the amount of urine, dark urine, unusual weakness or fatigue, confusion, muscle pain or cramps, fast or irregular heartbeat, joint pain Side effects that usually do not require medical attention (report to your care team if they continue or are bothersome): Constipation Diarrhea Fatigue Loss of appetite Nausea This list may not describe all possible  side effects. Call your doctor for medical advice about side effects. You may report side effects to FDA at 1-800-FDA-1088. Where should I keep my medication? This medication is given in a hospital or clinic. It will not be stored at home. NOTE: This sheet is a summary. It may not cover all possible information. If you have questions about this medicine, talk to your doctor, pharmacist, or health care provider.  2023 Elsevier/Gold Standard (2022-02-07 00:00:00)  

## 2022-10-10 NOTE — Progress Notes (Signed)
Per Dr. Marin Olp okay to treat today despite labs

## 2022-10-11 ENCOUNTER — Telehealth: Payer: Self-pay | Admitting: Student

## 2022-10-11 NOTE — Telephone Encounter (Signed)
*  STAT* If patient is at the pharmacy, call can be transferred to refill team.   1. Which medications need to be refilled? (please list name of each medication and dose if known) amiodarone (PACERONE) 200 MG tablet   2. Which pharmacy/location (including street and city if local pharmacy) is medication to be sent to?[id number]- [place], [place] - [address]   3. Do they need a 30 day or 90 day supply? [address]

## 2022-10-12 ENCOUNTER — Other Ambulatory Visit: Payer: Self-pay

## 2022-10-12 MED ORDER — AMIODARONE HCL 200 MG PO TABS: 200.0000 mg | ORAL_TABLET | ORAL | 0 refills | Status: DC

## 2022-10-12 NOTE — Telephone Encounter (Signed)
Pt's medication was sent to pt's pharmacy as requested. Confirmation received.

## 2022-10-23 ENCOUNTER — Other Ambulatory Visit: Payer: Self-pay | Admitting: Student

## 2022-10-24 ENCOUNTER — Inpatient Hospital Stay: Payer: Medicare Other

## 2022-10-24 ENCOUNTER — Inpatient Hospital Stay: Payer: Medicare Other | Admitting: Hematology & Oncology

## 2022-11-05 ENCOUNTER — Other Ambulatory Visit: Payer: Self-pay

## 2022-11-05 ENCOUNTER — Inpatient Hospital Stay (HOSPITAL_BASED_OUTPATIENT_CLINIC_OR_DEPARTMENT_OTHER): Payer: Medicare Other | Admitting: Hematology & Oncology

## 2022-11-05 ENCOUNTER — Encounter: Payer: Self-pay | Admitting: Hematology & Oncology

## 2022-11-05 ENCOUNTER — Inpatient Hospital Stay: Payer: Medicare Other | Attending: Family

## 2022-11-05 ENCOUNTER — Inpatient Hospital Stay: Payer: Medicare Other

## 2022-11-05 VITALS — BP 133/54 | HR 77 | Temp 98.4°F | Resp 18 | Ht 70.0 in | Wt 139.0 lb

## 2022-11-05 DIAGNOSIS — C9000 Multiple myeloma not having achieved remission: Secondary | ICD-10-CM

## 2022-11-05 DIAGNOSIS — Z5111 Encounter for antineoplastic chemotherapy: Secondary | ICD-10-CM | POA: Diagnosis not present

## 2022-11-05 LAB — CMP (CANCER CENTER ONLY)
ALT: 13 U/L (ref 0–44)
AST: 22 U/L (ref 15–41)
Albumin: 3.4 g/dL — ABNORMAL LOW (ref 3.5–5.0)
Alkaline Phosphatase: 54 U/L (ref 38–126)
Anion gap: 6 (ref 5–15)
BUN: 24 mg/dL — ABNORMAL HIGH (ref 8–23)
CO2: 29 mmol/L (ref 22–32)
Calcium: 8.8 mg/dL — ABNORMAL LOW (ref 8.9–10.3)
Chloride: 104 mmol/L (ref 98–111)
Creatinine: 1.69 mg/dL — ABNORMAL HIGH (ref 0.61–1.24)
GFR, Estimated: 39 mL/min — ABNORMAL LOW (ref >60)
Glucose, Bld: 158 mg/dL — ABNORMAL HIGH (ref 70–99)
Potassium: 4.5 mmol/L (ref 3.5–5.1)
Sodium: 139 mmol/L (ref 135–145)
Total Bilirubin: 0.6 mg/dL (ref 0.3–1.2)
Total Protein: 6.3 g/dL — ABNORMAL LOW (ref 6.5–8.1)

## 2022-11-05 LAB — CBC WITH DIFFERENTIAL (CANCER CENTER ONLY)
Abs Immature Granulocytes: 0.03 10*3/uL (ref 0.00–0.07)
Basophils Absolute: 0.1 10*3/uL (ref 0.0–0.1)
Basophils Relative: 1 %
Eosinophils Absolute: 0.3 10*3/uL (ref 0.0–0.5)
Eosinophils Relative: 5 %
HCT: 37.8 % — ABNORMAL LOW (ref 39.0–52.0)
Hemoglobin: 11.9 g/dL — ABNORMAL LOW (ref 13.0–17.0)
Immature Granulocytes: 0 %
Lymphocytes Relative: 24 %
Lymphs Abs: 1.7 10*3/uL (ref 0.7–4.0)
MCH: 31.3 pg (ref 26.0–34.0)
MCHC: 31.5 g/dL (ref 30.0–36.0)
MCV: 99.5 fL (ref 80.0–100.0)
Monocytes Absolute: 0.8 10*3/uL (ref 0.1–1.0)
Monocytes Relative: 12 %
Neutro Abs: 4 10*3/uL (ref 1.7–7.7)
Neutrophils Relative %: 58 %
Platelet Count: 176 10*3/uL (ref 150–400)
RBC: 3.8 MIL/uL — ABNORMAL LOW (ref 4.22–5.81)
RDW: 14.6 % (ref 11.5–15.5)
WBC Count: 6.9 10*3/uL (ref 4.0–10.5)
nRBC: 0 % (ref 0.0–0.2)

## 2022-11-05 LAB — LACTATE DEHYDROGENASE: LDH: 177 U/L (ref 98–192)

## 2022-11-05 MED ORDER — PROCHLORPERAZINE MALEATE 10 MG PO TABS
10.0000 mg | ORAL_TABLET | Freq: Once | ORAL | Status: AC
  Administered 2022-11-05: 10 mg via ORAL
  Filled 2022-11-05: qty 1

## 2022-11-05 MED ORDER — DEXAMETHASONE 4 MG PO TABS
20.0000 mg | ORAL_TABLET | Freq: Once | ORAL | Status: AC
  Administered 2022-11-05: 20 mg via ORAL
  Filled 2022-11-05: qty 5

## 2022-11-05 MED ORDER — BORTEZOMIB CHEMO SQ INJECTION 3.5 MG (2.5MG/ML)
1.3000 mg/m2 | Freq: Once | INTRAMUSCULAR | Status: AC
  Administered 2022-11-05: 2.25 mg via SUBCUTANEOUS
  Filled 2022-11-05: qty 0.9

## 2022-11-05 NOTE — Progress Notes (Signed)
Hematology and Oncology Follow Up Visit  Shawn Bauer [facility] [id number] January 04, 1934 89 y.o. 11/05/2022   Principle Diagnosis:  IgG Kappa myeloma -- high risk due to t(4:21)   Current Therapy:        Velcade/Decadron -- started 10/09/2019, s/p cycle #`34--treatment frequency changed to every other week on 01/21/2021 Xgeva 120 mg IM q 3 months -- next dose on 12/2022                      Interim History:  Shawn Bauer is here today for follow-up and treatment.  He is doing okay.  He relates that no specific complaints.  He had an episode of vertigo couple weeks ago.  He was supposed to come in to see [place] but he could not make it in.  He did not feel comfortable driving.  He is eating okay.  There is no problems with nausea or vomiting.  He has had no cough or shortness of breath.  There has been no change in bowel or bladder habits.  His last monoclonal spike in January was not observed.  His last Kappa light chain was 18.3 mg/dL.  He has had no leg swelling.  He does have some bruising.  He is on aspirin and Plavix.  Currently, I would have said his performance status is probably ECOG 1.    Medications:  Allergies as of 11/05/2022   No Known Allergies      Medication List        Accurate as of November 05, 2022  9:48 AM. If you have any questions, ask your nurse or doctor.          amiodarone 200 MG tablet Commonly known as: PACERONE TAKE 1 TABLET (200 MG TOTAL) BY MOUTH AS DIRECTED. TAKE 200MG DAILY MONDAY - FRIDAY ONLY.   aspirin EC 81 MG tablet Take 81 mg by mouth daily. Swallow whole.   atorvastatin 40 MG tablet Commonly known as: LIPITOR TAKE 1 TABLET BY MOUTH EVERY DAY   Breo Ellipta 200-25 MCG/ACT Aepb Generic drug: fluticasone furoate-vilanterol 1 puff daily.   CINNAMON PO Take 1,000 mg by mouth daily.   clopidogrel 75 MG tablet Commonly known as: PLAVIX TAKE 1 TABLET BY MOUTH EVERY DAY WITH BREAKFAST   cyanocobalamin 1000 MCG tablet Take 1,000 mcg by mouth  daily.   melatonin 5 MG Tabs Take 15 mg by mouth as needed.   metoprolol succinate 50 MG 24 hr tablet Commonly known as: TOPROL-XL Take 25 mg by mouth. What changed: Another medication with the same name was removed. Continue taking this medication, and follow the directions you see here. Changed by: [name], MD   nitroGLYCERIN 0.4 MG SL tablet Commonly known as: NITROSTAT Place 1 tablet (0.4 mg total) under the tongue every 5 (five) minutes as needed for chest pain.   olmesartan 40 MG tablet Commonly known as: BENICAR Take 40 mg by mouth in the morning.   ondansetron 8 MG tablet Commonly known as: ZOFRAN Take 1 tablet (8 mg total) by mouth every 8 (eight) hours as needed for nausea or vomiting.   polyethylene glycol 17 g packet Commonly known as: MIRALAX / GLYCOLAX Take 17 g by mouth daily.   Symbicort 160-4.5 MCG/ACT inhaler Generic drug: budesonide-formoterol Inhale 2 puffs into the lungs 2 (two) times daily.   traMADol 50 MG tablet Commonly known as: ULTRAM Take 50 mg by mouth 2 (two) times daily as needed (for pain).  Allergies: No Known Allergies  Past Medical History, Surgical history, Social history, and Family History were reviewed and updated.  Review of Systems: Review of Systems  Constitutional: Negative.   HENT: Negative.    Eyes: Negative.   Respiratory: Negative.    Cardiovascular: Negative.   Gastrointestinal: Negative.   Genitourinary: Negative.   Musculoskeletal: Negative.   Skin: Negative.   Neurological: Negative.   Endo/Heme/Allergies: Negative.   Psychiatric/Behavioral: Negative.       Physical Exam:  height is 5' 10"$  (1.778 m) and weight is 139 lb (63 kg). His oral temperature is 98.4 F (36.9 C). His blood pressure is 133/54 (abnormal) and his pulse is 77. His respiration is 18 and oxygen saturation is 98%.   Wt Readings from Last 3 Encounters:  11/05/22 139 lb (63 kg)  09/26/22 140 lb 1.9 oz (63.6 kg)  09/12/22  141 lb 1.9 oz (64 kg)   Physical Exam Vitals reviewed.  HENT:     Head: Normocephalic and atraumatic.  Eyes:     Pupils: Pupils are equal, round, and reactive to light.  Cardiovascular:     Rate and Rhythm: Normal rate and regular rhythm.     Heart sounds: Normal heart sounds.  Pulmonary:     Effort: Pulmonary effort is normal.     Breath sounds: Normal breath sounds.  Abdominal:     General: Bowel sounds are normal.     Palpations: Abdomen is soft.  Musculoskeletal:        General: No tenderness or deformity. Normal range of motion.     Cervical back: Normal range of motion.  Lymphadenopathy:     Cervical: No cervical adenopathy.  Skin:    General: Skin is warm and dry.     Findings: No erythema or rash.  Neurological:     Mental Status: He is alert and oriented to person, place, and time.  Psychiatric:        Behavior: Behavior normal.        Thought Content: Thought content normal.        Judgment: Judgment normal.      Lab Results  Component Value Date   WBC 6.9 11/05/2022   HGB 11.9 (L) 11/05/2022   HCT 37.8 (L) 11/05/2022   MCV 99.5 11/05/2022   PLT 176 11/05/2022   Lab Results  Component Value Date   FERRITIN 11 (L) 04/12/2022   IRON 36 (L) 04/12/2022   TIBC 330 04/12/2022   UIBC 294 04/12/2022   IRONPCTSAT 11 (L) 04/12/2022   Lab Results  Component Value Date   RETICCTPCT 1.9 04/12/2022   RBC 3.80 (L) 11/05/2022   Lab Results  Component Value Date   KPAFRELGTCHN 183.1 (H) 09/26/2022   LAMBDASER 9.1 09/26/2022   KAPLAMBRATIO 20.12 (H) 09/26/2022   Lab Results  Component Value Date   IGGSERUM 663 09/26/2022   IGA 115 09/26/2022   IGMSERUM 43 09/26/2022   Lab Results  Component Value Date   TOTALPROTELP 5.8 (L) 09/26/2022   ALBUMINELP 3.4 09/26/2022   A1GS 0.2 09/26/2022   A2GS 0.7 09/26/2022   BETS 0.8 09/26/2022   GAMS 0.7 09/26/2022   MSPIKE Not Observed 09/26/2022   SPEI Comment 08/15/2022     Chemistry      Component Value  Date/Time   NA 139 11/05/2022 0853   K 4.5 11/05/2022 0853   CL 104 11/05/2022 0853   CO2 29 11/05/2022 0853   BUN 24 (H) 11/05/2022 0853   CREATININE 1.69 (H)  11/05/2022 0853      Component Value Date/Time   CALCIUM 8.8 (L) 11/05/2022 0853   ALKPHOS 54 11/05/2022 0853   AST 22 11/05/2022 0853   ALT 13 11/05/2022 0853   BILITOT 0.6 11/05/2022 0853       Impression and Plan: Shawn Bauer is a very pleasant 89 yo caucasian gentleman with IgG kappa myeloma.  Again, I just wish that his Kappa light chain will come down a little bit better.  We will see what today's level is.  Again, we can always add Faspro if we needed to.  His quality life is doing pretty well.  He does have the cardiac issues.  I will plan to get him back in another month.  He will come back in 2 weeks for treatment.    [name], MD 2/12/20249:48 AM

## 2022-11-06 LAB — KAPPA/LAMBDA LIGHT CHAINS
Kappa free light chain: 184.9 mg/L — ABNORMAL HIGH (ref 3.3–19.4)
Kappa, lambda light chain ratio: 16.08 — ABNORMAL HIGH (ref 0.26–1.65)
Lambda free light chains: 11.5 mg/L (ref 5.7–26.3)

## 2022-11-06 LAB — IGG, IGA, IGM
IgA: 118 mg/dL (ref 61–437)
IgG (Immunoglobin G), Serum: 637 mg/dL (ref 603–1613)
IgM (Immunoglobulin M), Srm: 39 mg/dL (ref 15–143)

## 2022-11-08 LAB — PROTEIN ELECTROPHORESIS, SERUM
A/G Ratio: 1.5 (ref 0.7–1.7)
Albumin ELP: 3.5 g/dL (ref 2.9–4.4)
Alpha-1-Globulin: 0.2 g/dL (ref 0.0–0.4)
Alpha-2-Globulin: 0.7 g/dL (ref 0.4–1.0)
Beta Globulin: 0.8 g/dL (ref 0.7–1.3)
Gamma Globulin: 0.6 g/dL (ref 0.4–1.8)
Globulin, Total: 2.3 g/dL (ref 2.2–3.9)
Total Protein ELP: 5.8 g/dL — ABNORMAL LOW (ref 6.0–8.5)

## 2022-11-19 ENCOUNTER — Inpatient Hospital Stay: Payer: Medicare Other

## 2022-11-19 VITALS — BP 116/52 | HR 72 | Temp 98.3°F | Resp 17

## 2022-11-19 DIAGNOSIS — C9000 Multiple myeloma not having achieved remission: Secondary | ICD-10-CM

## 2022-11-19 DIAGNOSIS — Z5111 Encounter for antineoplastic chemotherapy: Secondary | ICD-10-CM | POA: Diagnosis not present

## 2022-11-19 LAB — CMP (CANCER CENTER ONLY)
ALT: 12 U/L (ref 0–44)
AST: 13 U/L — ABNORMAL LOW (ref 15–41)
Albumin: 3.9 g/dL (ref 3.5–5.0)
Alkaline Phosphatase: 46 U/L (ref 38–126)
Anion gap: 7 (ref 5–15)
BUN: 25 mg/dL — ABNORMAL HIGH (ref 8–23)
CO2: 29 mmol/L (ref 22–32)
Calcium: 9 mg/dL (ref 8.9–10.3)
Chloride: 104 mmol/L (ref 98–111)
Creatinine: 2 mg/dL — ABNORMAL HIGH (ref 0.61–1.24)
GFR, Estimated: 32 mL/min — ABNORMAL LOW (ref >60)
Glucose, Bld: 144 mg/dL — ABNORMAL HIGH (ref 70–99)
Potassium: 4.5 mmol/L (ref 3.5–5.1)
Sodium: 140 mmol/L (ref 135–145)
Total Bilirubin: 0.6 mg/dL (ref 0.3–1.2)
Total Protein: 6 g/dL — ABNORMAL LOW (ref 6.5–8.1)

## 2022-11-19 LAB — CBC WITH DIFFERENTIAL (CANCER CENTER ONLY)
Abs Immature Granulocytes: 0.05 10*3/uL (ref 0.00–0.07)
Basophils Absolute: 0.1 10*3/uL (ref 0.0–0.1)
Basophils Relative: 1 %
Eosinophils Absolute: 0.3 10*3/uL (ref 0.0–0.5)
Eosinophils Relative: 4 %
HCT: 38.4 % — ABNORMAL LOW (ref 39.0–52.0)
Hemoglobin: 12.3 g/dL — ABNORMAL LOW (ref 13.0–17.0)
Immature Granulocytes: 1 %
Lymphocytes Relative: 19 %
Lymphs Abs: 1.8 10*3/uL (ref 0.7–4.0)
MCH: 31.5 pg (ref 26.0–34.0)
MCHC: 32 g/dL (ref 30.0–36.0)
MCV: 98.2 fL (ref 80.0–100.0)
Monocytes Absolute: 1 10*3/uL (ref 0.1–1.0)
Monocytes Relative: 10 %
Neutro Abs: 6.3 10*3/uL (ref 1.7–7.7)
Neutrophils Relative %: 65 %
Platelet Count: 203 10*3/uL (ref 150–400)
RBC: 3.91 MIL/uL — ABNORMAL LOW (ref 4.22–5.81)
RDW: 14.2 % (ref 11.5–15.5)
WBC Count: 9.5 10*3/uL (ref 4.0–10.5)
nRBC: 0 % (ref 0.0–0.2)

## 2022-11-19 MED ORDER — DEXAMETHASONE 4 MG PO TABS
20.0000 mg | ORAL_TABLET | Freq: Once | ORAL | Status: AC
  Administered 2022-11-19: 20 mg via ORAL
  Filled 2022-11-19: qty 5

## 2022-11-19 MED ORDER — PROCHLORPERAZINE MALEATE 10 MG PO TABS
10.0000 mg | ORAL_TABLET | Freq: Once | ORAL | Status: AC
  Administered 2022-11-19: 10 mg via ORAL
  Filled 2022-11-19: qty 1

## 2022-11-19 MED ORDER — BORTEZOMIB CHEMO SQ INJECTION 3.5 MG (2.5MG/ML)
1.3000 mg/m2 | Freq: Once | INTRAMUSCULAR | Status: AC
  Administered 2022-11-19: 2.25 mg via SUBCUTANEOUS
  Filled 2022-11-19: qty 0.9

## 2022-11-19 NOTE — Patient Instructions (Signed)
[facility] [place]  Discharge Instructions: Thank you for choosing [facility] to provide your oncology and hematology care.   If you have a lab appointment with the [facility], please go directly to the [facility] and check in at the registration area.  Wear comfortable clothing and clothing appropriate for easy access to any Portacath or PICC line.   We strive to give you quality time with your provider. You may need to reschedule your appointment if you arrive late (15 or more minutes).  Arriving late affects you and other patients whose appointments are after yours.  Also, if you miss three or more appointments without notifying the office, you may be dismissed from the clinic at the provider's discretion.      For prescription refill requests, have your pharmacy contact our office and allow 72 hours for refills to be completed.    Today you received the following chemotherapy and/or immunotherapy agents Velcade     To help prevent nausea and vomiting after your treatment, we encourage you to take your nausea medication as directed.  BELOW ARE SYMPTOMS THAT SHOULD BE REPORTED IMMEDIATELY: *FEVER GREATER THAN 100.4 F (38 C) OR HIGHER *CHILLS OR SWEATING *NAUSEA AND VOMITING THAT IS NOT CONTROLLED WITH YOUR NAUSEA MEDICATION *UNUSUAL SHORTNESS OF BREATH *UNUSUAL BRUISING OR BLEEDING *URINARY PROBLEMS (pain or burning when urinating, or frequent urination) *BOWEL PROBLEMS (unusual diarrhea, constipation, pain near the anus) TENDERNESS IN MOUTH AND THROAT WITH OR WITHOUT PRESENCE OF ULCERS (sore throat, sores in mouth, or a toothache) UNUSUAL RASH, SWELLING OR PAIN  UNUSUAL VAGINAL DISCHARGE OR ITCHING   Items with * indicate a potential emergency and should be followed up as soon as possible or go to the Emergency Department if any problems should occur.  Please show the CHEMOTHERAPY ALERT CARD or IMMUNOTHERAPY ALERT CARD at check-in  to the Emergency Department and triage nurse. Should you have questions after your visit or need to cancel or reschedule your appointment, please contact [facility]  [phone] and follow the prompts.  Office hours are 8:00 a.m. to 4:30 p.m. Monday - Friday. Please note that voicemails left after 4:00 p.m. may not be returned until the following business day.  We are closed weekends and major holidays. You have access to a nurse at all times for urgent questions. Please call the main number to the clinic [phone] and follow the prompts.  For any non-urgent questions, you may also contact your provider using MyChart. We now offer e-Visits for anyone 9 and older to request care online for non-urgent symptoms. For details visit [email or web address].   Also download the MyChart app! Go to the app store, search "MyChart", open the app, select [facility], and log in with your MyChart username and password.

## 2022-11-19 NOTE — Progress Notes (Signed)
Reviewed patient labs with Dr. Marin Olp and patient ok to treat with creatinine 2.0

## 2022-12-03 ENCOUNTER — Inpatient Hospital Stay: Payer: Medicare Other | Admitting: Hematology & Oncology

## 2022-12-03 ENCOUNTER — Inpatient Hospital Stay: Payer: Medicare Other

## 2022-12-21 ENCOUNTER — Telehealth: Payer: Self-pay | Admitting: *Deleted

## 2022-12-21 NOTE — Patient Outreach (Signed)
  Care Coordination   12/21/2022 Name: Shawn Bauer MRN: [id number] DOB: 1933-12-15   Care Coordination Outreach Attempts:  An unsuccessful telephone outreach was attempted today to offer the patient information about available care coordination services as a benefit of their health plan.   Follow Up Plan:  Additional outreach attempts will be made to offer the patient care coordination information and services.   Encounter Outcome:  No Answer   Care Coordination Interventions:  No, not indicated    Eduard Clos, MSW, [place] Worker [facility] [phone]

## 2022-12-25 ENCOUNTER — Inpatient Hospital Stay: Payer: Medicare Other

## 2022-12-25 ENCOUNTER — Inpatient Hospital Stay (HOSPITAL_BASED_OUTPATIENT_CLINIC_OR_DEPARTMENT_OTHER): Payer: Medicare Other | Admitting: Family

## 2022-12-25 ENCOUNTER — Inpatient Hospital Stay: Payer: Medicare Other | Attending: Family

## 2022-12-25 ENCOUNTER — Encounter: Payer: Self-pay | Admitting: Family

## 2022-12-25 VITALS — BP 133/47 | HR 71 | Temp 98.1°F | Resp 17 | Wt 140.0 lb

## 2022-12-25 DIAGNOSIS — D631 Anemia in chronic kidney disease: Secondary | ICD-10-CM

## 2022-12-25 DIAGNOSIS — C9000 Multiple myeloma not having achieved remission: Secondary | ICD-10-CM | POA: Insufficient documentation

## 2022-12-25 DIAGNOSIS — Z5111 Encounter for antineoplastic chemotherapy: Secondary | ICD-10-CM | POA: Diagnosis present

## 2022-12-25 DIAGNOSIS — N183 Chronic kidney disease, stage 3 unspecified: Secondary | ICD-10-CM

## 2022-12-25 LAB — CBC WITH DIFFERENTIAL (CANCER CENTER ONLY)
Abs Immature Granulocytes: 0.04 10*3/uL (ref 0.00–0.07)
Basophils Absolute: 0.1 10*3/uL (ref 0.0–0.1)
Basophils Relative: 1 %
Eosinophils Absolute: 0.2 10*3/uL (ref 0.0–0.5)
Eosinophils Relative: 3 %
HCT: 38.2 % — ABNORMAL LOW (ref 39.0–52.0)
Hemoglobin: 11.9 g/dL — ABNORMAL LOW (ref 13.0–17.0)
Immature Granulocytes: 1 %
Lymphocytes Relative: 24 %
Lymphs Abs: 2 10*3/uL (ref 0.7–4.0)
MCH: 31.2 pg (ref 26.0–34.0)
MCHC: 31.2 g/dL (ref 30.0–36.0)
MCV: 100.3 fL — ABNORMAL HIGH (ref 80.0–100.0)
Monocytes Absolute: 1 10*3/uL (ref 0.1–1.0)
Monocytes Relative: 11 %
Neutro Abs: 5.1 10*3/uL (ref 1.7–7.7)
Neutrophils Relative %: 60 %
Platelet Count: 194 10*3/uL (ref 150–400)
RBC: 3.81 MIL/uL — ABNORMAL LOW (ref 4.22–5.81)
RDW: 14.8 % (ref 11.5–15.5)
WBC Count: 8.4 10*3/uL (ref 4.0–10.5)
nRBC: 0 % (ref 0.0–0.2)

## 2022-12-25 LAB — CMP (CANCER CENTER ONLY)
ALT: 9 U/L (ref 0–44)
AST: 12 U/L — ABNORMAL LOW (ref 15–41)
Albumin: 3.9 g/dL (ref 3.5–5.0)
Alkaline Phosphatase: 61 U/L (ref 38–126)
Anion gap: 10 (ref 5–15)
BUN: 30 mg/dL — ABNORMAL HIGH (ref 8–23)
CO2: 28 mmol/L (ref 22–32)
Calcium: 9.2 mg/dL (ref 8.9–10.3)
Chloride: 106 mmol/L (ref 98–111)
Creatinine: 1.7 mg/dL — ABNORMAL HIGH (ref 0.61–1.24)
GFR, Estimated: 39 mL/min — ABNORMAL LOW (ref >60)
Glucose, Bld: 153 mg/dL — ABNORMAL HIGH (ref 70–99)
Potassium: 4 mmol/L (ref 3.5–5.1)
Sodium: 144 mmol/L (ref 135–145)
Total Bilirubin: 0.7 mg/dL (ref 0.3–1.2)
Total Protein: 6.2 g/dL — ABNORMAL LOW (ref 6.5–8.1)

## 2022-12-25 LAB — LACTATE DEHYDROGENASE: LDH: 171 U/L (ref 98–192)

## 2022-12-25 MED ORDER — DEXAMETHASONE 4 MG PO TABS
20.0000 mg | ORAL_TABLET | Freq: Once | ORAL | Status: AC
  Administered 2022-12-25: 20 mg via ORAL
  Filled 2022-12-25: qty 5

## 2022-12-25 MED ORDER — BORTEZOMIB CHEMO SQ INJECTION 3.5 MG (2.5MG/ML)
1.3000 mg/m2 | Freq: Once | INTRAMUSCULAR | Status: AC
  Administered 2022-12-25: 2.25 mg via SUBCUTANEOUS
  Filled 2022-12-25: qty 0.9

## 2022-12-25 MED ORDER — PROCHLORPERAZINE MALEATE 10 MG PO TABS
10.0000 mg | ORAL_TABLET | Freq: Once | ORAL | Status: AC
  Administered 2022-12-25: 10 mg via ORAL
  Filled 2022-12-25: qty 1

## 2022-12-25 MED ORDER — DENOSUMAB 120 MG/1.7ML ~~LOC~~ SOLN
120.0000 mg | Freq: Once | SUBCUTANEOUS | Status: AC
  Administered 2022-12-25: 120 mg via SUBCUTANEOUS
  Filled 2022-12-25: qty 1.7
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
## 2022-12-25 NOTE — Progress Notes (Signed)
OK to treat with creat-1.7 per order of Dr. Ennever.  

## 2022-12-25 NOTE — Patient Instructions (Signed)
[facility]  Discharge Instructions: Thank you for choosing [facility] to provide your oncology and hematology care.   If you have a lab appointment with the Cancer Center, please go directly to the Cancer Center and check in at the registration area.  Wear comfortable clothing and clothing appropriate for easy access to any Portacath or PICC line.   We strive to give you quality time with your provider. You may need to reschedule your appointment if you arrive late (15 or more minutes).  Arriving late affects you and other patients whose appointments are after yours.  Also, if you miss three or more appointments without notifying the office, you may be dismissed from the clinic at the provider's discretion.      For prescription refill requests, have your pharmacy contact our office and allow 72 hours for refills to be completed.    Today you received the following chemotherapy and/or immunotherapy agents Velcade.      To help prevent nausea and vomiting after your treatment, we encourage you to take your nausea medication as directed.  BELOW ARE SYMPTOMS THAT SHOULD BE REPORTED IMMEDIATELY: *FEVER GREATER THAN 100.4 F (38 C) OR HIGHER *CHILLS OR SWEATING *NAUSEA AND VOMITING THAT IS NOT CONTROLLED WITH YOUR NAUSEA MEDICATION *UNUSUAL SHORTNESS OF BREATH *UNUSUAL BRUISING OR BLEEDING *URINARY PROBLEMS (pain or burning when urinating, or frequent urination) *BOWEL PROBLEMS (unusual diarrhea, constipation, pain near the anus) TENDERNESS IN MOUTH AND THROAT WITH OR WITHOUT PRESENCE OF ULCERS (sore throat, sores in mouth, or a toothache) UNUSUAL RASH, SWELLING OR PAIN  UNUSUAL VAGINAL DISCHARGE OR ITCHING   Items with * indicate a potential emergency and should be followed up as soon as possible or go to the Emergency Department if any problems should occur.  Please show the CHEMOTHERAPY ALERT CARD or IMMUNOTHERAPY ALERT CARD at check-in  to the Emergency Department and triage nurse. Should you have questions after your visit or need to cancel or reschedule your appointment, please contact [facility]  [phone] and follow the prompts.  Office hours are 8:00 a.m. to 4:30 p.m. Monday - Friday. Please note that voicemails left after 4:00 p.m. may not be returned until the following business day.  We are closed weekends and major holidays. You have access to a nurse at all times for urgent questions. Please call the main number to the clinic [phone] and follow the prompts.  For any non-urgent questions, you may also contact your provider using MyChart. We now offer e-Visits for anyone 18 and older to request care online for non-urgent symptoms. For details visit [email or web address].   Also download the MyChart app! Go to the app store, search "MyChart", open the app, select [facility], and log in with your MyChart username and password.   

## 2022-12-26 LAB — IGG, IGA, IGM
IgA: 147 mg/dL (ref 61–437)
IgG (Immunoglobin G), Serum: 740 mg/dL (ref 603–1613)
IgM (Immunoglobulin M), Srm: 40 mg/dL (ref 15–143)

## 2022-12-26 LAB — KAPPA/LAMBDA LIGHT CHAINS
Kappa free light chain: 158.7 mg/L — ABNORMAL HIGH (ref 3.3–19.4)
Kappa, lambda light chain ratio: 14.04 — ABNORMAL HIGH (ref 0.26–1.65)
Lambda free light chains: 11.3 mg/L (ref 5.7–26.3)

## 2022-12-27 ENCOUNTER — Other Ambulatory Visit: Payer: Self-pay

## 2022-12-27 LAB — PROTEIN ELECTROPHORESIS, SERUM, WITH REFLEX
A/G Ratio: 1.2 (ref 0.7–1.7)
Albumin ELP: 3.2 g/dL (ref 2.9–4.4)
Alpha-1-Globulin: 0.3 g/dL (ref 0.0–0.4)
Alpha-2-Globulin: 0.7 g/dL (ref 0.4–1.0)
Beta Globulin: 0.8 g/dL (ref 0.7–1.3)
Gamma Globulin: 0.8 g/dL (ref 0.4–1.8)
Globulin, Total: 2.6 g/dL (ref 2.2–3.9)
Total Protein ELP: 5.8 g/dL — ABNORMAL LOW (ref 6.0–8.5)

## 2022-12-29 ENCOUNTER — Other Ambulatory Visit: Payer: Self-pay

## 2023-01-03 ENCOUNTER — Encounter: Payer: Self-pay | Admitting: Internal Medicine

## 2023-01-03 ENCOUNTER — Ambulatory Visit: Payer: Medicare Other | Attending: Internal Medicine | Admitting: Internal Medicine

## 2023-01-03 VITALS — BP 124/52 | HR 71 | Ht 70.0 in | Wt 141.0 lb

## 2023-01-03 DIAGNOSIS — I10 Essential (primary) hypertension: Secondary | ICD-10-CM | POA: Diagnosis present

## 2023-01-03 DIAGNOSIS — I483 Typical atrial flutter: Secondary | ICD-10-CM

## 2023-01-03 NOTE — Patient Instructions (Signed)

## 2023-01-03 NOTE — Progress Notes (Signed)
HPI [name] returns today for followup. He is a pleasant 89 yo man with a h/o atrial flutter who underwent catheter ablation several months ago. He has done well in the interim. He denies chest pain, sob or syncope. He does not have palpitations. No edema. No palpitations.  No Known Allergies   Current Outpatient Medications  Medication Sig Dispense Refill   amiodarone (PACERONE) 200 MG tablet TAKE 1 TABLET (200 MG TOTAL) BY MOUTH AS DIRECTED. TAKE 200MG  DAILY MONDAY - FRIDAY ONLY. 90 tablet 0   aspirin EC 81 MG tablet Take 81 mg by mouth daily. Swallow whole.     atorvastatin (LIPITOR) 40 MG tablet TAKE 1 TABLET BY MOUTH EVERY DAY 90 tablet 0   BREO ELLIPTA 200-25 MCG/ACT AEPB 1 puff daily.     CINNAMON PO Take 1,000 mg by mouth daily.     clopidogrel (PLAVIX) 75 MG tablet TAKE 1 TABLET BY MOUTH EVERY DAY WITH BREAKFAST 90 tablet 3   cyanocobalamin 1000 MCG tablet Take 1,000 mcg by mouth daily.     melatonin 5 MG TABS Take 15 mg by mouth as needed.     metoprolol succinate (TOPROL-XL) 50 MG 24 hr tablet Take 25 mg by mouth.     olmesartan (BENICAR) 40 MG tablet Take 40 mg by mouth in the morning.     ondansetron (ZOFRAN) 8 MG tablet Take 1 tablet (8 mg total) by mouth every 8 (eight) hours as needed for nausea or vomiting. 40 tablet 4   polyethylene glycol (MIRALAX / GLYCOLAX) 17 g packet Take 17 g by mouth daily.     SYMBICORT 160-4.5 MCG/ACT inhaler Inhale 2 puffs into the lungs 2 (two) times daily.     traMADol (ULTRAM) 50 MG tablet Take 50 mg by mouth 2 (two) times daily as needed (for pain).     nitroGLYCERIN (NITROSTAT) 0.4 MG SL tablet Place 1 tablet (0.4 mg total) under the tongue every 5 (five) minutes as needed for chest pain. 25 tablet 11   No current facility-administered medications for this visit.     Past Medical History:  Diagnosis Date   Anemia of chronic kidney failure, stage 3 (moderate) 03/15/2022   Asthma    Atrial fibrillation    CAD (coronary artery  disease)    s/p CABG   Chronic renal insufficiency, stage 3 (moderate) 03/15/2022   Degenerative disk disease    cervical/spinal stenosis   Goals of care, counseling/discussion 09/30/2019   HTN (hypertension)    Hyperlipidemia    Iron deficiency anemia 04/12/2022   Kappa light chain myeloma 09/30/2019    ROS:   All systems reviewed and negative except as noted in the HPI.   Past Surgical History:  Procedure Laterality Date   A-FLUTTER ABLATION N/A 05/23/2020   Procedure: A-FLUTTER ABLATION;  Surgeon: Shawn Bauer, Shawn Dinges W, MD;  [facility];  Service: Cardiovascular;  Laterality: N/A;   APPENDECTOMY     BACK SURGERY     bilateral inguinal hernia repair     CORONARY ARTERY BYPASS GRAFT  2001   after positive exercise treadmill test.  LIMA-LAD, Radial artery to LCx marginal, SVG to second diagonal, SVG to PDA   CORONARY STENT INTERVENTION N/A 05/08/2021   Procedure: CORONARY STENT INTERVENTION;  Surgeon: Shawn RecordsSmith, Shawn W, MD;  [facility];  Service: Cardiovascular;  Laterality: N/A;   LEFT HEART CATH AND CORONARY ANGIOGRAPHY N/A 06/01/2021   Procedure: LEFT HEART CATH AND CORONARY  ANGIOGRAPHY;  Surgeon: Shawn Gess, MD;  Location: [facility] INVASIVE CV LAB;  Service: Cardiovascular;  Laterality: N/A;   LEFT HEART CATH AND CORS/GRAFTS ANGIOGRAPHY N/A 05/08/2021   Procedure: LEFT HEART CATH AND CORS/GRAFTS ANGIOGRAPHY;  Surgeon: Shawn Records, MD;  Location: [facility] INVASIVE CV LAB;  Service: Cardiovascular;  Laterality: N/A;   TONSILLECTOMY AND ADENOIDECTOMY       Family History  Problem Relation Age of Onset   Heart attack Father 45       died   Heart attack Brother      Social History   Socioeconomic History   Marital status: Married    Spouse name: Not on file   Number of children: 3   Years of education: Not on file   Highest education level: Not on file  Occupational History   Occupation: [employer]  Tobacco Use   Smoking status: Former     Packs/day: 0.50    Years: 15.00    Additional pack years: 0.00    Total pack years: 7.50    Types: Cigarettes    Quit date: 08/31/1964    Years since quitting: 58.3   Smokeless tobacco: Former    Types: Chew    Quit date: 04/24/2016  Vaping Use   Vaping Use: Never used  Substance and Sexual Activity   Alcohol use: No   Drug use: No   Sexual activity: Not Currently  Other Topics Concern   Not on file  Social History Narrative   Not on file   Social Determinants of Health   Financial Resource Strain: Low Risk  (07/04/2022)   Overall Financial Resource Strain (CARDIA)    Difficulty of Paying Living Expenses: Not hard at all  Food Insecurity: No Food Insecurity (07/04/2022)   Hunger Vital Sign    Worried About Running Out of Food in the Last Year: Never true    Ran Out of Food in the Last Year: Never true  Transportation Needs: No Transportation Needs (07/04/2022)   PRAPARE - Administrator, Civil Service (Medical): No    Lack of Transportation (Non-Medical): No  Physical Activity: Not on file  Stress: Not on file  Social Connections: Not on file  Intimate Partner Violence: Not At Risk (07/04/2022)   Humiliation, Afraid, Rape, and Kick questionnaire    Fear of Current or Ex-Partner: No    Emotionally Abused: No    Physically Abused: No    Sexually Abused: No     BP (!) 124/52   Pulse 71   Ht 5\' 10"  (1.778 m)   Wt 141 lb (64 kg)   SpO2 99%   BMI 20.23 kg/m   Physical Exam:  Well appearing NAD HEENT: Unremarkable Neck:  No JVD, no thyromegally Lymphatics:  No adenopathy Back:  No CVA tenderness Lungs:  Clear HEART:  Regular rate rhythm, no murmurs, no rubs, no clicks Abd:  soft, positive bowel sounds, no organomegally, no rebound, no guarding Ext:  2 plus pulses, no edema, no cyanosis, no clubbing Skin:  No rashes no nodules Neuro:  CN II through XII intact, motor grossly intact  EKG Nsr with first degree AV Block  DEVICE  Normal device  function.  See PaceArt for details.   Assess/Plan:  Atrial flutter - he is s/p ablation and maintaining NSR on no AA drug therapy. 2. CAD - he is s/p CABG but denies anginal symptoms.     Sharlot Gowda Alexios Keown,MD

## 2023-01-04 ENCOUNTER — Other Ambulatory Visit: Payer: Self-pay | Admitting: Hematology & Oncology

## 2023-01-04 DIAGNOSIS — C9000 Multiple myeloma not having achieved remission: Secondary | ICD-10-CM

## 2023-01-08 ENCOUNTER — Inpatient Hospital Stay: Payer: Medicare Other

## 2023-01-08 VITALS — BP 120/46 | HR 65 | Temp 98.1°F | Resp 18

## 2023-01-08 DIAGNOSIS — C9000 Multiple myeloma not having achieved remission: Secondary | ICD-10-CM

## 2023-01-08 DIAGNOSIS — Z5111 Encounter for antineoplastic chemotherapy: Secondary | ICD-10-CM | POA: Diagnosis not present

## 2023-01-08 LAB — CBC WITH DIFFERENTIAL (CANCER CENTER ONLY)
Abs Immature Granulocytes: 0.07 10*3/uL (ref 0.00–0.07)
Basophils Absolute: 0.1 10*3/uL (ref 0.0–0.1)
Basophils Relative: 1 %
Eosinophils Absolute: 0.3 10*3/uL (ref 0.0–0.5)
Eosinophils Relative: 4 %
HCT: 37.3 % — ABNORMAL LOW (ref 39.0–52.0)
Hemoglobin: 11.9 g/dL — ABNORMAL LOW (ref 13.0–17.0)
Immature Granulocytes: 1 %
Lymphocytes Relative: 26 %
Lymphs Abs: 2.3 10*3/uL (ref 0.7–4.0)
MCH: 31.7 pg (ref 26.0–34.0)
MCHC: 31.9 g/dL (ref 30.0–36.0)
MCV: 99.5 fL (ref 80.0–100.0)
Monocytes Absolute: 0.8 10*3/uL (ref 0.1–1.0)
Monocytes Relative: 9 %
Neutro Abs: 5.1 10*3/uL (ref 1.7–7.7)
Neutrophils Relative %: 59 %
Platelet Count: 187 10*3/uL (ref 150–400)
RBC: 3.75 MIL/uL — ABNORMAL LOW (ref 4.22–5.81)
RDW: 15.2 % (ref 11.5–15.5)
WBC Count: 8.7 10*3/uL (ref 4.0–10.5)
nRBC: 0 % (ref 0.0–0.2)

## 2023-01-08 LAB — CMP (CANCER CENTER ONLY)
ALT: 10 U/L (ref 0–44)
AST: 11 U/L — ABNORMAL LOW (ref 15–41)
Albumin: 3.6 g/dL (ref 3.5–5.0)
Alkaline Phosphatase: 51 U/L (ref 38–126)
Anion gap: 8 (ref 5–15)
BUN: 26 mg/dL — ABNORMAL HIGH (ref 8–23)
CO2: 30 mmol/L (ref 22–32)
Calcium: 8.8 mg/dL — ABNORMAL LOW (ref 8.9–10.3)
Chloride: 105 mmol/L (ref 98–111)
Creatinine: 1.96 mg/dL — ABNORMAL HIGH (ref 0.61–1.24)
GFR, Estimated: 33 mL/min — ABNORMAL LOW (ref >60)
Glucose, Bld: 139 mg/dL — ABNORMAL HIGH (ref 70–99)
Potassium: 4.9 mmol/L (ref 3.5–5.1)
Sodium: 143 mmol/L (ref 135–145)
Total Bilirubin: 0.6 mg/dL (ref 0.3–1.2)
Total Protein: 6 g/dL — ABNORMAL LOW (ref 6.5–8.1)

## 2023-01-08 MED ORDER — DEXAMETHASONE 4 MG PO TABS
20.0000 mg | ORAL_TABLET | Freq: Once | ORAL | Status: AC
  Administered 2023-01-08: 20 mg via ORAL
  Filled 2023-01-08: qty 5

## 2023-01-08 MED ORDER — BORTEZOMIB CHEMO SQ INJECTION 3.5 MG (2.5MG/ML)
1.3000 mg/m2 | Freq: Once | INTRAMUSCULAR | Status: AC
  Administered 2023-01-08: 2.25 mg via SUBCUTANEOUS
  Filled 2023-01-08: qty 0.9

## 2023-01-08 MED ORDER — PROCHLORPERAZINE MALEATE 10 MG PO TABS
10.0000 mg | ORAL_TABLET | Freq: Once | ORAL | Status: AC
  Administered 2023-01-08: 10 mg via ORAL
  Filled 2023-01-08: qty 1

## 2023-01-08 NOTE — Progress Notes (Signed)
OK to treat with today's serum creatinine value from today per Eileen Stanford, NP.

## 2023-01-08 NOTE — Patient Instructions (Signed)
[facility]  Discharge Instructions: Thank you for choosing [facility] to provide your oncology and hematology care.   If you have a lab appointment with the Cancer Center, please go directly to the Cancer Center and check in at the registration area.  Wear comfortable clothing and clothing appropriate for easy access to any Portacath or PICC line.   We strive to give you quality time with your provider. You may need to reschedule your appointment if you arrive late (15 or more minutes).  Arriving late affects you and other patients whose appointments are after yours.  Also, if you miss three or more appointments without notifying the office, you may be dismissed from the clinic at the provider's discretion.      For prescription refill requests, have your pharmacy contact our office and allow 72 hours for refills to be completed.    Today you received the following chemotherapy and/or immunotherapy agents Velcade.      To help prevent nausea and vomiting after your treatment, we encourage you to take your nausea medication as directed.  BELOW ARE SYMPTOMS THAT SHOULD BE REPORTED IMMEDIATELY: *FEVER GREATER THAN 100.4 F (38 C) OR HIGHER *CHILLS OR SWEATING *NAUSEA AND VOMITING THAT IS NOT CONTROLLED WITH YOUR NAUSEA MEDICATION *UNUSUAL SHORTNESS OF BREATH *UNUSUAL BRUISING OR BLEEDING *URINARY PROBLEMS (pain or burning when urinating, or frequent urination) *BOWEL PROBLEMS (unusual diarrhea, constipation, pain near the anus) TENDERNESS IN MOUTH AND THROAT WITH OR WITHOUT PRESENCE OF ULCERS (sore throat, sores in mouth, or a toothache) UNUSUAL RASH, SWELLING OR PAIN  UNUSUAL VAGINAL DISCHARGE OR ITCHING   Items with * indicate a potential emergency and should be followed up as soon as possible or go to the Emergency Department if any problems should occur.  Please show the CHEMOTHERAPY ALERT CARD or IMMUNOTHERAPY ALERT CARD at check-in  to the Emergency Department and triage nurse. Should you have questions after your visit or need to cancel or reschedule your appointment, please contact [facility]  [phone] and follow the prompts.  Office hours are 8:00 a.m. to 4:30 p.m. Monday - Friday. Please note that voicemails left after 4:00 p.m. may not be returned until the following business day.  We are closed weekends and major holidays. You have access to a nurse at all times for urgent questions. Please call the main number to the clinic [phone] and follow the prompts.  For any non-urgent questions, you may also contact your provider using MyChart. We now offer e-Visits for anyone 18 and older to request care online for non-urgent symptoms. For details visit [email or web address].   Also download the MyChart app! Go to the app store, search "MyChart", open the app, select [place], and log in with your MyChart username and password.   

## 2023-01-21 ENCOUNTER — Inpatient Hospital Stay: Payer: Medicare Other

## 2023-01-21 ENCOUNTER — Encounter: Payer: Self-pay | Admitting: Family

## 2023-01-21 ENCOUNTER — Inpatient Hospital Stay (HOSPITAL_BASED_OUTPATIENT_CLINIC_OR_DEPARTMENT_OTHER): Payer: Medicare Other | Admitting: Family

## 2023-01-21 DIAGNOSIS — C9000 Multiple myeloma not having achieved remission: Secondary | ICD-10-CM | POA: Diagnosis not present

## 2023-01-21 DIAGNOSIS — Z5111 Encounter for antineoplastic chemotherapy: Secondary | ICD-10-CM | POA: Diagnosis not present

## 2023-01-21 DIAGNOSIS — N183 Chronic kidney disease, stage 3 unspecified: Secondary | ICD-10-CM

## 2023-01-21 LAB — CMP (CANCER CENTER ONLY)
ALT: 11 U/L (ref 0–44)
AST: 12 U/L — ABNORMAL LOW (ref 15–41)
Albumin: 3.5 g/dL (ref 3.5–5.0)
Alkaline Phosphatase: 48 U/L (ref 38–126)
Anion gap: 7 (ref 5–15)
BUN: 27 mg/dL — ABNORMAL HIGH (ref 8–23)
CO2: 30 mmol/L (ref 22–32)
Calcium: 8.6 mg/dL — ABNORMAL LOW (ref 8.9–10.3)
Chloride: 105 mmol/L (ref 98–111)
Creatinine: 1.9 mg/dL — ABNORMAL HIGH (ref 0.61–1.24)
GFR, Estimated: 34 mL/min — ABNORMAL LOW (ref >60)
Glucose, Bld: 143 mg/dL — ABNORMAL HIGH (ref 70–99)
Potassium: 4.3 mmol/L (ref 3.5–5.1)
Sodium: 142 mmol/L (ref 135–145)
Total Bilirubin: 0.6 mg/dL (ref 0.3–1.2)
Total Protein: 5.5 g/dL — ABNORMAL LOW (ref 6.5–8.1)

## 2023-01-21 LAB — CBC WITH DIFFERENTIAL (CANCER CENTER ONLY)
Abs Immature Granulocytes: 0.05 10*3/uL (ref 0.00–0.07)
Basophils Absolute: 0.1 10*3/uL (ref 0.0–0.1)
Basophils Relative: 1 %
Eosinophils Absolute: 0.3 10*3/uL (ref 0.0–0.5)
Eosinophils Relative: 3 %
HCT: 36.1 % — ABNORMAL LOW (ref 39.0–52.0)
Hemoglobin: 11.3 g/dL — ABNORMAL LOW (ref 13.0–17.0)
Immature Granulocytes: 1 %
Lymphocytes Relative: 20 %
Lymphs Abs: 1.5 10*3/uL (ref 0.7–4.0)
MCH: 31.6 pg (ref 26.0–34.0)
MCHC: 31.3 g/dL (ref 30.0–36.0)
MCV: 100.8 fL — ABNORMAL HIGH (ref 80.0–100.0)
Monocytes Absolute: 0.9 10*3/uL (ref 0.1–1.0)
Monocytes Relative: 11 %
Neutro Abs: 5 10*3/uL (ref 1.7–7.7)
Neutrophils Relative %: 64 %
Platelet Count: 180 10*3/uL (ref 150–400)
RBC: 3.58 MIL/uL — ABNORMAL LOW (ref 4.22–5.81)
RDW: 15 % (ref 11.5–15.5)
WBC Count: 7.8 10*3/uL (ref 4.0–10.5)
nRBC: 0 % (ref 0.0–0.2)

## 2023-01-21 LAB — LACTATE DEHYDROGENASE: LDH: 169 U/L (ref 98–192)

## 2023-01-21 MED ORDER — PROCHLORPERAZINE MALEATE 10 MG PO TABS
10.0000 mg | ORAL_TABLET | Freq: Once | ORAL | Status: AC
  Administered 2023-01-21: 10 mg via ORAL
  Filled 2023-01-21: qty 1

## 2023-01-21 MED ORDER — BORTEZOMIB CHEMO SQ INJECTION 3.5 MG (2.5MG/ML)
1.3000 mg/m2 | Freq: Once | INTRAMUSCULAR | Status: AC
  Administered 2023-01-21: 2.25 mg via SUBCUTANEOUS
  Filled 2023-01-21: qty 0.9

## 2023-01-21 MED ORDER — DEXAMETHASONE 4 MG PO TABS
20.0000 mg | ORAL_TABLET | Freq: Once | ORAL | Status: AC
  Administered 2023-01-21: 20 mg via ORAL
  Filled 2023-01-21: qty 5

## 2023-01-21 NOTE — Patient Instructions (Signed)
[facility]  Discharge Instructions: Thank you for choosing [facility] to provide your oncology and hematology care.   If you have a lab appointment with the Cancer Center, please go directly to the Cancer Center and check in at the registration area.  Wear comfortable clothing and clothing appropriate for easy access to any Portacath or PICC line.   We strive to give you quality time with your provider. You may need to reschedule your appointment if you arrive late (15 or more minutes).  Arriving late affects you and other patients whose appointments are after yours.  Also, if you miss three or more appointments without notifying the office, you may be dismissed from the clinic at the provider's discretion.      For prescription refill requests, have your pharmacy contact our office and allow 72 hours for refills to be completed.    Today you received the following chemotherapy and/or immunotherapy agents Velcade.      To help prevent nausea and vomiting after your treatment, we encourage you to take your nausea medication as directed.  BELOW ARE SYMPTOMS THAT SHOULD BE REPORTED IMMEDIATELY: *FEVER GREATER THAN 100.4 F (38 C) OR HIGHER *CHILLS OR SWEATING *NAUSEA AND VOMITING THAT IS NOT CONTROLLED WITH YOUR NAUSEA MEDICATION *UNUSUAL SHORTNESS OF BREATH *UNUSUAL BRUISING OR BLEEDING *URINARY PROBLEMS (pain or burning when urinating, or frequent urination) *BOWEL PROBLEMS (unusual diarrhea, constipation, pain near the anus) TENDERNESS IN MOUTH AND THROAT WITH OR WITHOUT PRESENCE OF ULCERS (sore throat, sores in mouth, or a toothache) UNUSUAL RASH, SWELLING OR PAIN  UNUSUAL VAGINAL DISCHARGE OR ITCHING   Items with * indicate a potential emergency and should be followed up as soon as possible or go to the Emergency Department if any problems should occur.  Please show the CHEMOTHERAPY ALERT CARD or IMMUNOTHERAPY ALERT CARD at check-in  to the Emergency Department and triage nurse. Should you have questions after your visit or need to cancel or reschedule your appointment, please contact [facility]  [phone] and follow the prompts.  Office hours are 8:00 a.m. to 4:30 p.m. Monday - Friday. Please note that voicemails left after 4:00 p.m. may not be returned until the following business day.  We are closed weekends and major holidays. You have access to a nurse at all times for urgent questions. Please call the main number to the clinic [phone] and follow the prompts.  For any non-urgent questions, you may also contact your provider using MyChart. We now offer e-Visits for anyone 18 and older to request care online for non-urgent symptoms. For details visit [email or web address].   Also download the MyChart app! Go to the app store, search "MyChart", open the app, select [place], and log in with your MyChart username and password.   

## 2023-01-21 NOTE — Progress Notes (Signed)
Hematology and Oncology Follow Up Visit  Shawn Bauer [facility] [id number] 27-Apr-1934 89 y.o. 01/21/2023   Principle Diagnosis:  IgG Kappa myeloma -- high risk due to t(4:21)   Current Therapy:        Velcade/Decadron -- started 10/09/2019, s/p cycle 34 - treatment frequency changed to every other week on 01/21/2021 Xgeva 120 mg IM q 3 months -- next dose on 03/2023      Interim History:  Shawn Bauer is here today for follow-up and treatment. He is doing well and has no complaints at this time.  He states that his feet continue to heal nicely and he is back in shoes again!  No falls or syncope.  No swelling, numbness or tingling in his extremities at this time.  No M-spike observed in April, IgG level 740 mg/dL and kappa light chains were 15.87 mg/dL.  No issue with infection. No fever, chills, n/v, cough, rash, dizziness, SOB, chest pain, palpitations, abdominal pain or changes in bowel or bladder habits.  Appetite is good but he admits that he needs to better hydrate throughout the day. His weight is stable at 140 lbs.   ECOG Performance Status: 1 - Symptomatic but completely ambulatory  Medications:  Allergies as of 01/21/2023   No Known Allergies      Medication List        Accurate as of January 21, 2023  9:38 AM. If you have any questions, ask your nurse or doctor.          amiodarone 200 MG tablet Commonly known as: PACERONE TAKE 1 TABLET (200 MG TOTAL) BY MOUTH AS DIRECTED. TAKE 200MG  DAILY MONDAY - FRIDAY ONLY.   aspirin EC 81 MG tablet Take 81 mg by mouth daily. Swallow whole.   atorvastatin 40 MG tablet Commonly known as: LIPITOR TAKE 1 TABLET BY MOUTH EVERY DAY   Breo Ellipta 200-25 MCG/ACT Aepb Generic drug: fluticasone furoate-vilanterol 1 puff daily.   CINNAMON PO Take 1,000 mg by mouth daily.   clopidogrel 75 MG tablet Commonly known as: PLAVIX TAKE 1 TABLET BY MOUTH EVERY DAY WITH BREAKFAST   cyanocobalamin 1000 MCG tablet Take 1,000 mcg by mouth  daily.   melatonin 5 MG Tabs Take 15 mg by mouth as needed.   metoprolol succinate 50 MG 24 hr tablet Commonly known as: TOPROL-XL Take 25 mg by mouth.   nitroGLYCERIN 0.4 MG SL tablet Commonly known as: NITROSTAT Place 1 tablet (0.4 mg total) under the tongue every 5 (five) minutes as needed for chest pain.   olmesartan 40 MG tablet Commonly known as: BENICAR Take 40 mg by mouth in the morning.   ondansetron 8 MG tablet Commonly known as: ZOFRAN Take 1 tablet (8 mg total) by mouth every 8 (eight) hours as needed for nausea or vomiting.   polyethylene glycol 17 g packet Commonly known as: MIRALAX / GLYCOLAX Take 17 g by mouth daily.   Symbicort 160-4.5 MCG/ACT inhaler Generic drug: budesonide-formoterol Inhale 2 puffs into the lungs 2 (two) times daily.   traMADol 50 MG tablet Commonly known as: ULTRAM Take 50 mg by mouth 2 (two) times daily as needed (for pain).        Allergies: No Known Allergies  Past Medical History, Surgical history, Social history, and Family History were reviewed and updated.  Review of Systems: All other 10 point review of systems is negative.   Physical Exam:  vitals were not taken for this visit.   Wt Readings from Last 3 Encounters:  01/03/23 141 lb (64 kg)  12/25/22 140 lb (63.5 kg)  11/05/22 139 lb (63 kg)    Ocular: Sclerae unicteric, pupils equal, round and reactive to light Ear-nose-throat: Oropharynx clear, dentition fair Lymphatic: No cervical or supraclavicular adenopathy Lungs no rales or rhonchi, good excursion bilaterally Heart regular rate and rhythm, no murmur appreciated Abd soft, nontender, positive bowel sounds MSK no focal spinal tenderness, no joint edema Neuro: non-focal, well-oriented, appropriate affect Breasts: Deferred   Lab Results  Component Value Date   WBC 7.8 01/21/2023   HGB 11.3 (L) 01/21/2023   HCT 36.1 (L) 01/21/2023   MCV 100.8 (H) 01/21/2023   PLT 180 01/21/2023   Lab Results   Component Value Date   FERRITIN 11 (L) 04/12/2022   IRON 36 (L) 04/12/2022   TIBC 330 04/12/2022   UIBC 294 04/12/2022   IRONPCTSAT 11 (L) 04/12/2022   Lab Results  Component Value Date   RETICCTPCT 1.9 04/12/2022   RBC 3.58 (L) 01/21/2023   Lab Results  Component Value Date   KPAFRELGTCHN 158.7 (H) 12/25/2022   LAMBDASER 11.3 12/25/2022   KAPLAMBRATIO 14.04 (H) 12/25/2022   Lab Results  Component Value Date   IGGSERUM 740 12/25/2022   IGA 147 12/25/2022   IGMSERUM 40 12/25/2022   Lab Results  Component Value Date   TOTALPROTELP 5.8 (L) 12/25/2022   ALBUMINELP 3.2 12/25/2022   A1GS 0.3 12/25/2022   A2GS 0.7 12/25/2022   BETS 0.8 12/25/2022   GAMS 0.8 12/25/2022   MSPIKE Not Observed 12/25/2022   SPEI Comment 11/05/2022     Chemistry      Component Value Date/Time   NA 143 01/08/2023 0934   K 4.9 01/08/2023 0934   CL 105 01/08/2023 0934   CO2 30 01/08/2023 0934   BUN 26 (H) 01/08/2023 0934   CREATININE 1.96 (H) 01/08/2023 0934      Component Value Date/Time   CALCIUM 8.8 (L) 01/08/2023 0934   ALKPHOS 51 01/08/2023 0934   AST 11 (L) 01/08/2023 0934   ALT 10 01/08/2023 0934   BILITOT 0.6 01/08/2023 0934       Impression and Plan: Shawn Bauer is a very pleasant 89 yo caucasian gentleman with IgG kappa myeloma.   Protein studies are pending.  Ok to proceed with Velcade per MD.  Lab and treatment every 2 weeks, follow-up in 4 weeks.   Eileen Stanford, NP 4/29/20249:38 AM

## 2023-01-21 NOTE — Progress Notes (Signed)
Reviewed pt labs with Sarah Carter NP and pt ok to treat with creatinine 1.9 

## 2023-01-22 ENCOUNTER — Ambulatory Visit: Payer: Medicare Other

## 2023-01-22 ENCOUNTER — Ambulatory Visit: Payer: Medicare Other | Admitting: Family

## 2023-01-22 ENCOUNTER — Other Ambulatory Visit: Payer: Medicare Other

## 2023-01-22 LAB — IGG, IGA, IGM
IgA: 110 mg/dL (ref 61–437)
IgG (Immunoglobin G), Serum: 645 mg/dL (ref 603–1613)
IgM (Immunoglobulin M), Srm: 34 mg/dL (ref 15–143)

## 2023-01-22 LAB — KAPPA/LAMBDA LIGHT CHAINS
Kappa free light chain: 152.6 mg/L — ABNORMAL HIGH (ref 3.3–19.4)
Kappa, lambda light chain ratio: 15.9 — ABNORMAL HIGH (ref 0.26–1.65)
Lambda free light chains: 9.6 mg/L (ref 5.7–26.3)

## 2023-01-23 ENCOUNTER — Other Ambulatory Visit: Payer: Self-pay

## 2023-01-23 LAB — PROTEIN ELECTROPHORESIS, SERUM
A/G Ratio: 1.6 (ref 0.7–1.7)
Albumin ELP: 3.4 g/dL (ref 2.9–4.4)
Alpha-1-Globulin: 0.2 g/dL (ref 0.0–0.4)
Alpha-2-Globulin: 0.7 g/dL (ref 0.4–1.0)
Beta Globulin: 0.7 g/dL (ref 0.7–1.3)
Gamma Globulin: 0.5 g/dL (ref 0.4–1.8)
Globulin, Total: 2.1 g/dL — ABNORMAL LOW (ref 2.2–3.9)
Total Protein ELP: 5.5 g/dL — ABNORMAL LOW (ref 6.0–8.5)

## 2023-02-05 ENCOUNTER — Inpatient Hospital Stay: Payer: Medicare Other | Attending: Family

## 2023-02-05 ENCOUNTER — Inpatient Hospital Stay: Payer: Medicare Other | Admitting: Family

## 2023-02-05 ENCOUNTER — Inpatient Hospital Stay: Payer: Medicare Other

## 2023-02-05 VITALS — BP 125/51 | HR 69 | Temp 97.9°F | Resp 18

## 2023-02-05 DIAGNOSIS — C9000 Multiple myeloma not having achieved remission: Secondary | ICD-10-CM

## 2023-02-05 DIAGNOSIS — Z5111 Encounter for antineoplastic chemotherapy: Secondary | ICD-10-CM | POA: Diagnosis present

## 2023-02-05 LAB — CBC WITH DIFFERENTIAL (CANCER CENTER ONLY)
Abs Immature Granulocytes: 0.04 10*3/uL (ref 0.00–0.07)
Basophils Absolute: 0.1 10*3/uL (ref 0.0–0.1)
Basophils Relative: 1 %
Eosinophils Absolute: 0.2 10*3/uL (ref 0.0–0.5)
Eosinophils Relative: 3 %
HCT: 38.8 % — ABNORMAL LOW (ref 39.0–52.0)
Hemoglobin: 12.1 g/dL — ABNORMAL LOW (ref 13.0–17.0)
Immature Granulocytes: 0 %
Lymphocytes Relative: 17 %
Lymphs Abs: 1.6 10*3/uL (ref 0.7–4.0)
MCH: 31.4 pg (ref 26.0–34.0)
MCHC: 31.2 g/dL (ref 30.0–36.0)
MCV: 100.8 fL — ABNORMAL HIGH (ref 80.0–100.0)
Monocytes Absolute: 1 10*3/uL (ref 0.1–1.0)
Monocytes Relative: 11 %
Neutro Abs: 6.4 10*3/uL (ref 1.7–7.7)
Neutrophils Relative %: 68 %
Platelet Count: 198 10*3/uL (ref 150–400)
RBC: 3.85 MIL/uL — ABNORMAL LOW (ref 4.22–5.81)
RDW: 15.3 % (ref 11.5–15.5)
WBC Count: 9.4 10*3/uL (ref 4.0–10.5)
nRBC: 0 % (ref 0.0–0.2)

## 2023-02-05 LAB — CMP (CANCER CENTER ONLY)
ALT: 14 U/L (ref 0–44)
AST: 15 U/L (ref 15–41)
Albumin: 4.1 g/dL (ref 3.5–5.0)
Alkaline Phosphatase: 58 U/L (ref 38–126)
Anion gap: 6 (ref 5–15)
BUN: 36 mg/dL — ABNORMAL HIGH (ref 8–23)
CO2: 30 mmol/L (ref 22–32)
Calcium: 10.1 mg/dL (ref 8.9–10.3)
Chloride: 103 mmol/L (ref 98–111)
Creatinine: 2.32 mg/dL — ABNORMAL HIGH (ref 0.61–1.24)
GFR, Estimated: 27 mL/min — ABNORMAL LOW (ref >60)
Glucose, Bld: 137 mg/dL — ABNORMAL HIGH (ref 70–99)
Potassium: 5.1 mmol/L (ref 3.5–5.1)
Sodium: 139 mmol/L (ref 135–145)
Total Bilirubin: 0.8 mg/dL (ref 0.3–1.2)
Total Protein: 6.3 g/dL — ABNORMAL LOW (ref 6.5–8.1)

## 2023-02-05 MED ORDER — BORTEZOMIB CHEMO SQ INJECTION 3.5 MG (2.5MG/ML)
1.3000 mg/m2 | Freq: Once | INTRAMUSCULAR | Status: AC
  Administered 2023-02-05: 2.25 mg via SUBCUTANEOUS
  Filled 2023-02-05: qty 0.9

## 2023-02-05 MED ORDER — DEXAMETHASONE 4 MG PO TABS
20.0000 mg | ORAL_TABLET | Freq: Once | ORAL | Status: AC
  Administered 2023-02-05: 20 mg via ORAL
  Filled 2023-02-05: qty 5

## 2023-02-05 MED ORDER — PROCHLORPERAZINE MALEATE 10 MG PO TABS
10.0000 mg | ORAL_TABLET | Freq: Once | ORAL | Status: AC
  Administered 2023-02-05: 10 mg via ORAL
  Filled 2023-02-05: qty 1

## 2023-02-05 NOTE — Progress Notes (Signed)
Ok to treat with Creatinine 2.32 per Dr. Myna Hidalgo.

## 2023-02-05 NOTE — Patient Instructions (Signed)
[facility]  Discharge Instructions: Thank you for choosing [facility] to provide your oncology and hematology care.   If you have a lab appointment with the Cancer Center, please go directly to the Cancer Center and check in at the registration area.  Wear comfortable clothing and clothing appropriate for easy access to any Portacath or PICC line.   We strive to give you quality time with your provider. You may need to reschedule your appointment if you arrive late (15 or more minutes).  Arriving late affects you and other patients whose appointments are after yours.  Also, if you miss three or more appointments without notifying the office, you may be dismissed from the clinic at the provider's discretion.      For prescription refill requests, have your pharmacy contact our office and allow 72 hours for refills to be completed.    Today you received the following chemotherapy and/or immunotherapy agents Velcade.      To help prevent nausea and vomiting after your treatment, we encourage you to take your nausea medication as directed.  BELOW ARE SYMPTOMS THAT SHOULD BE REPORTED IMMEDIATELY: *FEVER GREATER THAN 100.4 F (38 C) OR HIGHER *CHILLS OR SWEATING *NAUSEA AND VOMITING THAT IS NOT CONTROLLED WITH YOUR NAUSEA MEDICATION *UNUSUAL SHORTNESS OF BREATH *UNUSUAL BRUISING OR BLEEDING *URINARY PROBLEMS (pain or burning when urinating, or frequent urination) *BOWEL PROBLEMS (unusual diarrhea, constipation, pain near the anus) TENDERNESS IN MOUTH AND THROAT WITH OR WITHOUT PRESENCE OF ULCERS (sore throat, sores in mouth, or a toothache) UNUSUAL RASH, SWELLING OR PAIN  UNUSUAL VAGINAL DISCHARGE OR ITCHING   Items with * indicate a potential emergency and should be followed up as soon as possible or go to the Emergency Department if any problems should occur.  Please show the CHEMOTHERAPY ALERT CARD or IMMUNOTHERAPY ALERT CARD at check-in  to the Emergency Department and triage nurse. Should you have questions after your visit or need to cancel or reschedule your appointment, please contact [facility]  [phone] and follow the prompts.  Office hours are 8:00 a.m. to 4:30 p.m. Monday - Friday. Please note that voicemails left after 4:00 p.m. may not be returned until the following business day.  We are closed weekends and major holidays. You have access to a nurse at all times for urgent questions. Please call the main number to the clinic [phone] and follow the prompts.  For any non-urgent questions, you may also contact your provider using MyChart. We now offer e-Visits for anyone 18 and older to request care online for non-urgent symptoms. For details visit [email or web address].   Also download the MyChart app! Go to the app store, search "MyChart", open the app, select [place], and log in with your MyChart username and password.   

## 2023-02-05 NOTE — Progress Notes (Signed)
OK to treat with serum creatinine level from today per Dr. Myna Hidalgo.

## 2023-02-19 ENCOUNTER — Inpatient Hospital Stay: Payer: Medicare Other

## 2023-02-19 ENCOUNTER — Other Ambulatory Visit: Payer: Self-pay

## 2023-02-19 ENCOUNTER — Inpatient Hospital Stay (HOSPITAL_BASED_OUTPATIENT_CLINIC_OR_DEPARTMENT_OTHER): Payer: Medicare Other | Admitting: Hematology & Oncology

## 2023-02-19 ENCOUNTER — Encounter: Payer: Self-pay | Admitting: Hematology & Oncology

## 2023-02-19 VITALS — BP 139/57 | HR 57 | Temp 98.1°F | Resp 18 | Wt 139.0 lb

## 2023-02-19 DIAGNOSIS — Z5111 Encounter for antineoplastic chemotherapy: Secondary | ICD-10-CM | POA: Diagnosis not present

## 2023-02-19 DIAGNOSIS — C9000 Multiple myeloma not having achieved remission: Secondary | ICD-10-CM | POA: Diagnosis not present

## 2023-02-19 LAB — CBC WITH DIFFERENTIAL (CANCER CENTER ONLY)
Abs Immature Granulocytes: 0.05 10*3/uL (ref 0.00–0.07)
Basophils Absolute: 0.1 10*3/uL (ref 0.0–0.1)
Basophils Relative: 1 %
Eosinophils Absolute: 0.2 10*3/uL (ref 0.0–0.5)
Eosinophils Relative: 2 %
HCT: 37.1 % — ABNORMAL LOW (ref 39.0–52.0)
Hemoglobin: 11.7 g/dL — ABNORMAL LOW (ref 13.0–17.0)
Immature Granulocytes: 1 %
Lymphocytes Relative: 21 %
Lymphs Abs: 1.7 10*3/uL (ref 0.7–4.0)
MCH: 32.1 pg (ref 26.0–34.0)
MCHC: 31.5 g/dL (ref 30.0–36.0)
MCV: 101.9 fL — ABNORMAL HIGH (ref 80.0–100.0)
Monocytes Absolute: 0.7 10*3/uL (ref 0.1–1.0)
Monocytes Relative: 9 %
Neutro Abs: 5.4 10*3/uL (ref 1.7–7.7)
Neutrophils Relative %: 66 %
Platelet Count: 201 10*3/uL (ref 150–400)
RBC: 3.64 MIL/uL — ABNORMAL LOW (ref 4.22–5.81)
RDW: 15 % (ref 11.5–15.5)
WBC Count: 8.1 10*3/uL (ref 4.0–10.5)
nRBC: 0 % (ref 0.0–0.2)

## 2023-02-19 LAB — CMP (CANCER CENTER ONLY)
ALT: 14 U/L (ref 0–44)
AST: 15 U/L (ref 15–41)
Albumin: 4.1 g/dL (ref 3.5–5.0)
Alkaline Phosphatase: 49 U/L (ref 38–126)
Anion gap: 8 (ref 5–15)
BUN: 24 mg/dL — ABNORMAL HIGH (ref 8–23)
CO2: 28 mmol/L (ref 22–32)
Calcium: 9.4 mg/dL (ref 8.9–10.3)
Chloride: 105 mmol/L (ref 98–111)
Creatinine: 1.97 mg/dL — ABNORMAL HIGH (ref 0.61–1.24)
GFR, Estimated: 32 mL/min — ABNORMAL LOW (ref >60)
Glucose, Bld: 154 mg/dL — ABNORMAL HIGH (ref 70–99)
Potassium: 4.2 mmol/L (ref 3.5–5.1)
Sodium: 141 mmol/L (ref 135–145)
Total Bilirubin: 0.9 mg/dL (ref 0.3–1.2)
Total Protein: 5.9 g/dL — ABNORMAL LOW (ref 6.5–8.1)

## 2023-02-19 LAB — LACTATE DEHYDROGENASE: LDH: 191 U/L (ref 98–192)

## 2023-02-19 MED ORDER — PROCHLORPERAZINE MALEATE 10 MG PO TABS
10.0000 mg | ORAL_TABLET | Freq: Once | ORAL | Status: AC
  Administered 2023-02-19: 10 mg via ORAL
  Filled 2023-02-19: qty 1

## 2023-02-19 MED ORDER — BORTEZOMIB CHEMO SQ INJECTION 3.5 MG (2.5MG/ML)
1.3000 mg/m2 | Freq: Once | INTRAMUSCULAR | Status: AC
  Administered 2023-02-19: 2.25 mg via SUBCUTANEOUS
  Filled 2023-02-19: qty 0.9

## 2023-02-19 MED ORDER — DEXAMETHASONE 4 MG PO TABS
20.0000 mg | ORAL_TABLET | Freq: Once | ORAL | Status: AC
  Administered 2023-02-19: 20 mg via ORAL
  Filled 2023-02-19: qty 5

## 2023-02-19 NOTE — Progress Notes (Signed)
Hematology and Oncology Follow Up Visit  Shawn Bauer [facility] [id number] Aug 26, 1934 89 y.o. 02/19/2023   Principle Diagnosis:  IgG Kappa myeloma -- high risk due to t(4:21)   Current Therapy:        Velcade/Decadron -- started 10/09/2019, s/p cycle #`34--treatment frequency changed to every other week on 01/21/2021 Xgeva 120 mg IM q 3 months -- next dose on 03/2023                      Interim History:  Shawn Bauer is here today for follow-up and treatment.  He she comes in with her granddaughter.  She lives in [place].  It was a lot of fun talking to her.   He had a nice Memorial Day weekend.  He really did not do all that much.  He is eating okay.  He has had no problems with nausea or vomiting.  There has been no problems with cough or shortness of breath.  He has had no change in bowel or bladder habits.  His last myeloma studies back in April did not show a monoclonal spike in his blood.  His IgG level was 645 mg/dL.  The Kappa light chain was 15.3 mg/dL.  He has had no bleeding.  There is been a little bit of leg swelling.  He has had no cardiac issues.  He continues on aspirin and Plavix.  I would have said that at the present time, his performance status is probably ECOG 1.     Medications:  Allergies as of 02/19/2023   No Known Allergies      Medication List        Accurate as of Feb 19, 2023 11:08 AM. If you have any questions, ask your nurse or doctor.          STOP taking these medications    Symbicort 160-4.5 MCG/ACT inhaler Generic drug: budesonide-formoterol Stopped by: Josph Macho, MD       TAKE these medications    amiodarone 200 MG tablet Commonly known as: PACERONE TAKE 1 TABLET (200 MG TOTAL) BY MOUTH AS DIRECTED. TAKE 200MG  DAILY MONDAY - FRIDAY ONLY.   aspirin EC 81 MG tablet Take 81 mg by mouth daily. Swallow whole.   atorvastatin 40 MG tablet Commonly known as: LIPITOR TAKE 1 TABLET BY MOUTH EVERY DAY   Breo Ellipta 200-25  MCG/ACT Aepb Generic drug: fluticasone furoate-vilanterol 1 puff daily.   CINNAMON PO Take 1,000 mg by mouth daily.   clopidogrel 75 MG tablet Commonly known as: PLAVIX TAKE 1 TABLET BY MOUTH EVERY DAY WITH BREAKFAST   cyanocobalamin 1000 MCG tablet Take 1,000 mcg by mouth daily.   melatonin 5 MG Tabs Take 15 mg by mouth as needed.   metoprolol succinate 50 MG 24 hr tablet Commonly known as: TOPROL-XL Take 25 mg by mouth.   nitroGLYCERIN 0.4 MG SL tablet Commonly known as: NITROSTAT Place 1 tablet (0.4 mg total) under the tongue every 5 (five) minutes as needed for chest pain.   olmesartan 40 MG tablet Commonly known as: BENICAR Take 40 mg by mouth in the morning.   ondansetron 8 MG tablet Commonly known as: ZOFRAN Take 1 tablet (8 mg total) by mouth every 8 (eight) hours as needed for nausea or vomiting.   polyethylene glycol 17 g packet Commonly known as: MIRALAX / GLYCOLAX Take 17 g by mouth daily.   traMADol 50 MG tablet Commonly known as: ULTRAM Take 50 mg by mouth 2 (  two) times daily as needed (for pain).        Allergies: No Known Allergies  Past Medical History, Surgical history, Social history, and Family History were reviewed and updated.  Review of Systems: Review of Systems  Constitutional: Negative.   HENT: Negative.    Eyes: Negative.   Respiratory: Negative.    Cardiovascular: Negative.   Gastrointestinal: Negative.   Genitourinary: Negative.   Musculoskeletal: Negative.   Skin: Negative.   Neurological: Negative.   Endo/Heme/Allergies: Negative.   Psychiatric/Behavioral: Negative.       Physical Exam:  weight is 139 lb (63 kg). His oral temperature is 98.1 F (36.7 C). His blood pressure is 139/57 (abnormal) and his pulse is 57 (abnormal). His respiration is 18 and oxygen saturation is 97%.   Wt Readings from Last 3 Encounters:  02/19/23 139 lb (63 kg)  01/21/23 140 lb 1.9 oz (63.6 kg)  01/03/23 141 lb (64 kg)   Physical  Exam Vitals reviewed.  HENT:     Head: Normocephalic and atraumatic.  Eyes:     Pupils: Pupils are equal, round, and reactive to light.  Cardiovascular:     Rate and Rhythm: Normal rate and regular rhythm.     Heart sounds: Normal heart sounds.  Pulmonary:     Effort: Pulmonary effort is normal.     Breath sounds: Normal breath sounds.  Abdominal:     General: Bowel sounds are normal.     Palpations: Abdomen is soft.  Musculoskeletal:        General: No tenderness or deformity. Normal range of motion.     Cervical back: Normal range of motion.  Lymphadenopathy:     Cervical: No cervical adenopathy.  Skin:    General: Skin is warm and dry.     Findings: No erythema or rash.  Neurological:     Mental Status: He is alert and oriented to person, place, and time.  Psychiatric:        Behavior: Behavior normal.        Thought Content: Thought content normal.        Judgment: Judgment normal.     Lab Results  Component Value Date   WBC 8.1 02/19/2023   HGB 11.7 (L) 02/19/2023   HCT 37.1 (L) 02/19/2023   MCV 101.9 (H) 02/19/2023   PLT 201 02/19/2023   Lab Results  Component Value Date   FERRITIN 11 (L) 04/12/2022   IRON 36 (L) 04/12/2022   TIBC 330 04/12/2022   UIBC 294 04/12/2022   IRONPCTSAT 11 (L) 04/12/2022   Lab Results  Component Value Date   RETICCTPCT 1.9 04/12/2022   RBC 3.64 (L) 02/19/2023   Lab Results  Component Value Date   KPAFRELGTCHN 152.6 (H) 01/21/2023   LAMBDASER 9.6 01/21/2023   KAPLAMBRATIO 15.90 (H) 01/21/2023   Lab Results  Component Value Date   IGGSERUM 645 01/21/2023   IGA 110 01/21/2023   IGMSERUM 34 01/21/2023   Lab Results  Component Value Date   TOTALPROTELP 5.5 (L) 01/21/2023   ALBUMINELP 3.4 01/21/2023   A1GS 0.2 01/21/2023   A2GS 0.7 01/21/2023   BETS 0.7 01/21/2023   GAMS 0.5 01/21/2023   MSPIKE Not Observed 01/21/2023   SPEI Comment 01/21/2023     Chemistry      Component Value Date/Time   NA 141 02/19/2023 0933    K 4.2 02/19/2023 0933   CL 105 02/19/2023 0933   CO2 28 02/19/2023 0933   BUN 24 (H) 02/19/2023  5784   CREATININE 1.97 (H) 02/19/2023 0933      Component Value Date/Time   CALCIUM 9.4 02/19/2023 0933   ALKPHOS 49 02/19/2023 0933   AST 15 02/19/2023 0933   ALT 14 02/19/2023 0933   BILITOT 0.9 02/19/2023 0933       Impression and Plan: Shawn Bauer is a very pleasant 89 yo caucasian gentleman with IgG kappa myeloma.  I think is doing okay.  His Kappa light chain is slowly coming down.  For right now, I really do not think we have to make any additions or changes to his protocol.  His quality of life is pretty good right now.  I am happy about this.  I want to make sure that we continue to focus on his quality of life.  He will go ahead and get his Velcade today.  He comes back in 2 weeks for his next dose.   I will plan to see him back in 1 month.     Josph Macho, MD 5/28/202411:08 AM

## 2023-02-19 NOTE — Patient Instructions (Signed)
[facility]  Discharge Instructions: Thank you for choosing [facility] to provide your oncology and hematology care.   If you have a lab appointment with the Cancer Center, please go directly to the Cancer Center and check in at the registration area.  Wear comfortable clothing and clothing appropriate for easy access to any Portacath or PICC line.   We strive to give you quality time with your provider. You may need to reschedule your appointment if you arrive late (15 or more minutes).  Arriving late affects you and other patients whose appointments are after yours.  Also, if you miss three or more appointments without notifying the office, you may be dismissed from the clinic at the provider's discretion.      For prescription refill requests, have your pharmacy contact our office and allow 72 hours for refills to be completed.    Today you received the following chemotherapy and/or immunotherapy agents Velcade.      To help prevent nausea and vomiting after your treatment, we encourage you to take your nausea medication as directed.  BELOW ARE SYMPTOMS THAT SHOULD BE REPORTED IMMEDIATELY: *FEVER GREATER THAN 100.4 F (38 C) OR HIGHER *CHILLS OR SWEATING *NAUSEA AND VOMITING THAT IS NOT CONTROLLED WITH YOUR NAUSEA MEDICATION *UNUSUAL SHORTNESS OF BREATH *UNUSUAL BRUISING OR BLEEDING *URINARY PROBLEMS (pain or burning when urinating, or frequent urination) *BOWEL PROBLEMS (unusual diarrhea, constipation, pain near the anus) TENDERNESS IN MOUTH AND THROAT WITH OR WITHOUT PRESENCE OF ULCERS (sore throat, sores in mouth, or a toothache) UNUSUAL RASH, SWELLING OR PAIN  UNUSUAL VAGINAL DISCHARGE OR ITCHING   Items with * indicate a potential emergency and should be followed up as soon as possible or go to the Emergency Department if any problems should occur.  Please show the CHEMOTHERAPY ALERT CARD or IMMUNOTHERAPY ALERT CARD at check-in  to the Emergency Department and triage nurse. Should you have questions after your visit or need to cancel or reschedule your appointment, please contact [facility]  [phone] and follow the prompts.  Office hours are 8:00 a.m. to 4:30 p.m. Monday - Friday. Please note that voicemails left after 4:00 p.m. may not be returned until the following business day.  We are closed weekends and major holidays. You have access to a nurse at all times for urgent questions. Please call the main number to the clinic [phone] and follow the prompts.  For any non-urgent questions, you may also contact your provider using MyChart. We now offer e-Visits for anyone 18 and older to request care online for non-urgent symptoms. For details visit [email or web address].   Also download the MyChart app! Go to the app store, search "MyChart", open the app, select [place], and log in with your MyChart username and password.   

## 2023-02-19 NOTE — Progress Notes (Signed)
Reviewed pt labs with Dr. Myna Hidalgo and pt ok to treat with creatinine 1.97

## 2023-02-20 LAB — IGG, IGA, IGM
IgA: 110 mg/dL (ref 61–437)
IgG (Immunoglobin G), Serum: 766 mg/dL (ref 603–1613)
IgM (Immunoglobulin M), Srm: 31 mg/dL (ref 15–143)

## 2023-02-20 LAB — KAPPA/LAMBDA LIGHT CHAINS
Kappa free light chain: 102 mg/L — ABNORMAL HIGH (ref 3.3–19.4)
Kappa, lambda light chain ratio: 13.78 — ABNORMAL HIGH (ref 0.26–1.65)
Lambda free light chains: 7.4 mg/L (ref 5.7–26.3)

## 2023-02-21 ENCOUNTER — Other Ambulatory Visit: Payer: Self-pay

## 2023-02-22 LAB — PROTEIN ELECTROPHORESIS, SERUM
A/G Ratio: 1.7 (ref 0.7–1.7)
Albumin ELP: 3.5 g/dL (ref 2.9–4.4)
Alpha-1-Globulin: 0.2 g/dL (ref 0.0–0.4)
Alpha-2-Globulin: 0.7 g/dL (ref 0.4–1.0)
Beta Globulin: 0.7 g/dL (ref 0.7–1.3)
Gamma Globulin: 0.5 g/dL (ref 0.4–1.8)
Globulin, Total: 2.1 g/dL — ABNORMAL LOW (ref 2.2–3.9)
Total Protein ELP: 5.6 g/dL — ABNORMAL LOW (ref 6.0–8.5)

## 2023-02-23 ENCOUNTER — Other Ambulatory Visit: Payer: Self-pay | Admitting: Hematology & Oncology

## 2023-02-23 ENCOUNTER — Other Ambulatory Visit: Payer: Self-pay | Admitting: Internal Medicine

## 2023-02-24 ENCOUNTER — Encounter: Payer: Self-pay | Admitting: Hematology & Oncology

## 2023-03-04 ENCOUNTER — Inpatient Hospital Stay: Payer: Medicare Other | Attending: Family

## 2023-03-04 ENCOUNTER — Inpatient Hospital Stay: Payer: Medicare Other

## 2023-03-04 ENCOUNTER — Other Ambulatory Visit: Payer: Self-pay

## 2023-03-04 VITALS — BP 134/51 | HR 68 | Temp 97.9°F | Resp 16

## 2023-03-04 DIAGNOSIS — Z5111 Encounter for antineoplastic chemotherapy: Secondary | ICD-10-CM | POA: Insufficient documentation

## 2023-03-04 DIAGNOSIS — C9000 Multiple myeloma not having achieved remission: Secondary | ICD-10-CM | POA: Diagnosis present

## 2023-03-04 LAB — CBC WITH DIFFERENTIAL (CANCER CENTER ONLY)
Abs Immature Granulocytes: 0.05 10*3/uL (ref 0.00–0.07)
Basophils Absolute: 0.1 10*3/uL (ref 0.0–0.1)
Basophils Relative: 1 %
Eosinophils Absolute: 0.3 10*3/uL (ref 0.0–0.5)
Eosinophils Relative: 3 %
HCT: 35.7 % — ABNORMAL LOW (ref 39.0–52.0)
Hemoglobin: 11.3 g/dL — ABNORMAL LOW (ref 13.0–17.0)
Immature Granulocytes: 1 %
Lymphocytes Relative: 21 %
Lymphs Abs: 1.6 10*3/uL (ref 0.7–4.0)
MCH: 31.9 pg (ref 26.0–34.0)
MCHC: 31.7 g/dL (ref 30.0–36.0)
MCV: 100.8 fL — ABNORMAL HIGH (ref 80.0–100.0)
Monocytes Absolute: 0.9 10*3/uL (ref 0.1–1.0)
Monocytes Relative: 11 %
Neutro Abs: 5.1 10*3/uL (ref 1.7–7.7)
Neutrophils Relative %: 63 %
Platelet Count: 162 10*3/uL (ref 150–400)
RBC: 3.54 MIL/uL — ABNORMAL LOW (ref 4.22–5.81)
RDW: 14.7 % (ref 11.5–15.5)
WBC Count: 8 10*3/uL (ref 4.0–10.5)
nRBC: 0 % (ref 0.0–0.2)

## 2023-03-04 LAB — CMP (CANCER CENTER ONLY)
ALT: 10 U/L (ref 0–44)
AST: 12 U/L — ABNORMAL LOW (ref 15–41)
Albumin: 3.8 g/dL (ref 3.5–5.0)
Alkaline Phosphatase: 48 U/L (ref 38–126)
Anion gap: 6 (ref 5–15)
BUN: 25 mg/dL — ABNORMAL HIGH (ref 8–23)
CO2: 29 mmol/L (ref 22–32)
Calcium: 8.7 mg/dL — ABNORMAL LOW (ref 8.9–10.3)
Chloride: 105 mmol/L (ref 98–111)
Creatinine: 1.88 mg/dL — ABNORMAL HIGH (ref 0.61–1.24)
GFR, Estimated: 34 mL/min — ABNORMAL LOW (ref >60)
Glucose, Bld: 123 mg/dL — ABNORMAL HIGH (ref 70–99)
Potassium: 4.1 mmol/L (ref 3.5–5.1)
Sodium: 140 mmol/L (ref 135–145)
Total Bilirubin: 0.7 mg/dL (ref 0.3–1.2)
Total Protein: 5.9 g/dL — ABNORMAL LOW (ref 6.5–8.1)

## 2023-03-04 MED ORDER — DEXAMETHASONE 4 MG PO TABS
20.0000 mg | ORAL_TABLET | Freq: Once | ORAL | Status: AC
  Administered 2023-03-04: 20 mg via ORAL
  Filled 2023-03-04: qty 5

## 2023-03-04 MED ORDER — PROCHLORPERAZINE MALEATE 10 MG PO TABS
10.0000 mg | ORAL_TABLET | Freq: Once | ORAL | Status: AC
  Administered 2023-03-04: 10 mg via ORAL
  Filled 2023-03-04: qty 1

## 2023-03-04 MED ORDER — BORTEZOMIB CHEMO SQ INJECTION 3.5 MG (2.5MG/ML)
1.3000 mg/m2 | Freq: Once | INTRAMUSCULAR | Status: AC
  Administered 2023-03-04: 2.25 mg via SUBCUTANEOUS
  Filled 2023-03-04: qty 0.9

## 2023-03-04 NOTE — Progress Notes (Signed)
Ok to treat with creatinine 1.88 per Dr Myna Hidalgo. dph

## 2023-03-04 NOTE — Patient Instructions (Signed)
[facility]  Discharge Instructions: Thank you for choosing [facility] to provide your oncology and hematology care.   If you have a lab appointment with the Cancer Center, please go directly to the Cancer Center and check in at the registration area.  Wear comfortable clothing and clothing appropriate for easy access to any Portacath or PICC line.   We strive to give you quality time with your provider. You may need to reschedule your appointment if you arrive late (15 or more minutes).  Arriving late affects you and other patients whose appointments are after yours.  Also, if you miss three or more appointments without notifying the office, you may be dismissed from the clinic at the provider's discretion.      For prescription refill requests, have your pharmacy contact our office and allow 72 hours for refills to be completed.    Today you received the following chemotherapy and/or immunotherapy agents Velcade.      To help prevent nausea and vomiting after your treatment, we encourage you to take your nausea medication as directed.  BELOW ARE SYMPTOMS THAT SHOULD BE REPORTED IMMEDIATELY: *FEVER GREATER THAN 100.4 F (38 C) OR HIGHER *CHILLS OR SWEATING *NAUSEA AND VOMITING THAT IS NOT CONTROLLED WITH YOUR NAUSEA MEDICATION *UNUSUAL SHORTNESS OF BREATH *UNUSUAL BRUISING OR BLEEDING *URINARY PROBLEMS (pain or burning when urinating, or frequent urination) *BOWEL PROBLEMS (unusual diarrhea, constipation, pain near the anus) TENDERNESS IN MOUTH AND THROAT WITH OR WITHOUT PRESENCE OF ULCERS (sore throat, sores in mouth, or a toothache) UNUSUAL RASH, SWELLING OR PAIN  UNUSUAL VAGINAL DISCHARGE OR ITCHING   Items with * indicate a potential emergency and should be followed up as soon as possible or go to the Emergency Department if any problems should occur.  Please show the CHEMOTHERAPY ALERT CARD or IMMUNOTHERAPY ALERT CARD at check-in  to the Emergency Department and triage nurse. Should you have questions after your visit or need to cancel or reschedule your appointment, please contact [facility]  [phone] and follow the prompts.  Office hours are 8:00 a.m. to 4:30 p.m. Monday - Friday. Please note that voicemails left after 4:00 p.m. may not be returned until the following business day.  We are closed weekends and major holidays. You have access to a nurse at all times for urgent questions. Please call the main number to the clinic [phone] and follow the prompts.  For any non-urgent questions, you may also contact your provider using MyChart. We now offer e-Visits for anyone 18 and older to request care online for non-urgent symptoms. For details visit [email or web address].   Also download the MyChart app! Go to the app store, search "MyChart", open the app, select [place], and log in with your MyChart username and password.   

## 2023-03-18 ENCOUNTER — Other Ambulatory Visit: Payer: Self-pay | Admitting: Hematology & Oncology

## 2023-03-18 ENCOUNTER — Inpatient Hospital Stay (HOSPITAL_BASED_OUTPATIENT_CLINIC_OR_DEPARTMENT_OTHER): Payer: Medicare Other | Admitting: Family

## 2023-03-18 ENCOUNTER — Inpatient Hospital Stay: Payer: Medicare Other

## 2023-03-18 ENCOUNTER — Other Ambulatory Visit: Payer: Self-pay

## 2023-03-18 ENCOUNTER — Encounter: Payer: Self-pay | Admitting: Family

## 2023-03-18 DIAGNOSIS — D6869 Other thrombophilia: Secondary | ICD-10-CM

## 2023-03-18 DIAGNOSIS — I472 Ventricular tachycardia, unspecified: Secondary | ICD-10-CM

## 2023-03-18 DIAGNOSIS — C9000 Multiple myeloma not having achieved remission: Secondary | ICD-10-CM | POA: Diagnosis not present

## 2023-03-18 DIAGNOSIS — Z7189 Other specified counseling: Secondary | ICD-10-CM

## 2023-03-18 DIAGNOSIS — N183 Chronic kidney disease, stage 3 unspecified: Secondary | ICD-10-CM

## 2023-03-18 DIAGNOSIS — Z5111 Encounter for antineoplastic chemotherapy: Secondary | ICD-10-CM | POA: Diagnosis not present

## 2023-03-18 DIAGNOSIS — R778 Other specified abnormalities of plasma proteins: Secondary | ICD-10-CM

## 2023-03-18 DIAGNOSIS — E7800 Pure hypercholesterolemia, unspecified: Secondary | ICD-10-CM

## 2023-03-18 DIAGNOSIS — I4729 Other ventricular tachycardia: Secondary | ICD-10-CM

## 2023-03-18 DIAGNOSIS — I10 Essential (primary) hypertension: Secondary | ICD-10-CM

## 2023-03-18 DIAGNOSIS — I25710 Atherosclerosis of autologous vein coronary artery bypass graft(s) with unstable angina pectoris: Secondary | ICD-10-CM

## 2023-03-18 DIAGNOSIS — I214 Non-ST elevation (NSTEMI) myocardial infarction: Secondary | ICD-10-CM

## 2023-03-18 DIAGNOSIS — I483 Typical atrial flutter: Secondary | ICD-10-CM

## 2023-03-18 DIAGNOSIS — D508 Other iron deficiency anemias: Secondary | ICD-10-CM

## 2023-03-18 LAB — CMP (CANCER CENTER ONLY)
ALT: 9 U/L (ref 0–44)
AST: 13 U/L — ABNORMAL LOW (ref 15–41)
Albumin: 3.9 g/dL (ref 3.5–5.0)
Alkaline Phosphatase: 53 U/L (ref 38–126)
Anion gap: 8 (ref 5–15)
BUN: 23 mg/dL (ref 8–23)
CO2: 28 mmol/L (ref 22–32)
Calcium: 9 mg/dL (ref 8.9–10.3)
Chloride: 105 mmol/L (ref 98–111)
Creatinine: 1.89 mg/dL — ABNORMAL HIGH (ref 0.61–1.24)
GFR, Estimated: 34 mL/min — ABNORMAL LOW (ref >60)
Glucose, Bld: 150 mg/dL — ABNORMAL HIGH (ref 70–99)
Potassium: 4.5 mmol/L (ref 3.5–5.1)
Sodium: 141 mmol/L (ref 135–145)
Total Bilirubin: 0.7 mg/dL (ref 0.3–1.2)
Total Protein: 5.9 g/dL — ABNORMAL LOW (ref 6.5–8.1)

## 2023-03-18 LAB — CBC WITH DIFFERENTIAL (CANCER CENTER ONLY)
Abs Immature Granulocytes: 0.04 10*3/uL (ref 0.00–0.07)
Basophils Absolute: 0.1 10*3/uL (ref 0.0–0.1)
Basophils Relative: 1 %
Eosinophils Absolute: 0.2 10*3/uL (ref 0.0–0.5)
Eosinophils Relative: 3 %
HCT: 36.4 % — ABNORMAL LOW (ref 39.0–52.0)
Hemoglobin: 11.5 g/dL — ABNORMAL LOW (ref 13.0–17.0)
Immature Granulocytes: 0 %
Lymphocytes Relative: 20 %
Lymphs Abs: 1.8 10*3/uL (ref 0.7–4.0)
MCH: 32 pg (ref 26.0–34.0)
MCHC: 31.6 g/dL (ref 30.0–36.0)
MCV: 101.4 fL — ABNORMAL HIGH (ref 80.0–100.0)
Monocytes Absolute: 0.8 10*3/uL (ref 0.1–1.0)
Monocytes Relative: 9 %
Neutro Abs: 6.2 10*3/uL (ref 1.7–7.7)
Neutrophils Relative %: 67 %
Platelet Count: 209 10*3/uL (ref 150–400)
RBC: 3.59 MIL/uL — ABNORMAL LOW (ref 4.22–5.81)
RDW: 14.9 % (ref 11.5–15.5)
WBC Count: 9.2 10*3/uL (ref 4.0–10.5)
nRBC: 0 % (ref 0.0–0.2)

## 2023-03-18 LAB — LACTATE DEHYDROGENASE: LDH: 187 U/L (ref 98–192)

## 2023-03-18 MED ORDER — DEXAMETHASONE 4 MG PO TABS
20.0000 mg | ORAL_TABLET | Freq: Once | ORAL | Status: AC
  Administered 2023-03-18: 20 mg via ORAL
  Filled 2023-03-18: qty 5

## 2023-03-18 MED ORDER — PROCHLORPERAZINE MALEATE 10 MG PO TABS
10.0000 mg | ORAL_TABLET | Freq: Once | ORAL | Status: AC
  Administered 2023-03-18: 10 mg via ORAL
  Filled 2023-03-18: qty 1

## 2023-03-18 MED ORDER — BORTEZOMIB CHEMO SQ INJECTION 3.5 MG (2.5MG/ML)
1.3000 mg/m2 | Freq: Once | INTRAMUSCULAR | Status: AC
  Administered 2023-03-18: 2.25 mg via SUBCUTANEOUS
  Filled 2023-03-18: qty 0.9

## 2023-03-18 NOTE — Patient Instructions (Signed)
[facility]  Discharge Instructions: Thank you for choosing [facility] to provide your oncology and hematology care.   If you have a lab appointment with the Cancer Center, please go directly to the Cancer Center and check in at the registration area.  Wear comfortable clothing and clothing appropriate for easy access to any Portacath or PICC line.   We strive to give you quality time with your provider. You may need to reschedule your appointment if you arrive late (15 or more minutes).  Arriving late affects you and other patients whose appointments are after yours.  Also, if you miss three or more appointments without notifying the office, you may be dismissed from the clinic at the provider's discretion.      For prescription refill requests, have your pharmacy contact our office and allow 72 hours for refills to be completed.    Today you received the following chemotherapy and/or immunotherapy agents Velcade.      To help prevent nausea and vomiting after your treatment, we encourage you to take your nausea medication as directed.  BELOW ARE SYMPTOMS THAT SHOULD BE REPORTED IMMEDIATELY: *FEVER GREATER THAN 100.4 F (38 C) OR HIGHER *CHILLS OR SWEATING *NAUSEA AND VOMITING THAT IS NOT CONTROLLED WITH YOUR NAUSEA MEDICATION *UNUSUAL SHORTNESS OF BREATH *UNUSUAL BRUISING OR BLEEDING *URINARY PROBLEMS (pain or burning when urinating, or frequent urination) *BOWEL PROBLEMS (unusual diarrhea, constipation, pain near the anus) TENDERNESS IN MOUTH AND THROAT WITH OR WITHOUT PRESENCE OF ULCERS (sore throat, sores in mouth, or a toothache) UNUSUAL RASH, SWELLING OR PAIN  UNUSUAL VAGINAL DISCHARGE OR ITCHING   Items with * indicate a potential emergency and should be followed up as soon as possible or go to the Emergency Department if any problems should occur.  Please show the CHEMOTHERAPY ALERT CARD or IMMUNOTHERAPY ALERT CARD at check-in  to the Emergency Department and triage nurse. Should you have questions after your visit or need to cancel or reschedule your appointment, please contact [facility]  [phone] and follow the prompts.  Office hours are 8:00 a.m. to 4:30 p.m. Monday - Friday. Please note that voicemails left after 4:00 p.m. may not be returned until the following business day.  We are closed weekends and major holidays. You have access to a nurse at all times for urgent questions. Please call the main number to the clinic [phone] and follow the prompts.  For any non-urgent questions, you may also contact your provider using MyChart. We now offer e-Visits for anyone 18 and older to request care online for non-urgent symptoms. For details visit [email or web address].   Also download the MyChart app! Go to the app store, search "MyChart", open the app, select , and log in with your MyChart username and password.   

## 2023-03-18 NOTE — Progress Notes (Signed)
Hematology and Oncology Follow Up Visit  Shawn Bauer [facility] [id number] 13-Feb-1934 89 y.o. 03/18/2023   Principle Diagnosis:  IgG Kappa myeloma -- high risk due to t(4:21)   Current Therapy:        Velcade/Decadron -- started 10/09/2019, s/p cycle 34 - treatment frequency changed to every other week on 01/21/2021 Xgeva 120 mg IM q 3 months -- next dose on 03/2023      Interim History:  Shawn Bauer is here today for follow-up and treatment. He is doing well. He has been avoiding the heat outside but does enjoy taking care of his 3 beagles in th evenings.  No M-spike noted last month, IgG level was 766 mg/dL and kappa light chains were down to 10.2 mg/dL (previously (40.98 mg/dL).  No issue with infections. No fever, chills, n/v, cough, rash, dizziness, chest pain, palpitations, abdominal pain or changes in bowel or bladder habits.  Mild SOB with over exertion at times. He will take a break to rest when needed.  No swelling in his extremities. 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  No falls or syncope reported. He ambulates with his cane for added support.  Appetite and hydration are good. Weight is stable at 138 lbs.   ECOG Performance Status: 1 - Symptomatic but completely ambulatory  Medications:  Allergies as of 03/18/2023   No Known Allergies      Medication List        Accurate as of March 18, 2023 10:02 AM. If you have any questions, ask your nurse or doctor.          amiodarone 200 MG tablet Commonly known as: PACERONE TAKE 1 TABLET (200 MG TOTAL) BY MOUTH AS DIRECTED. TAKE 200MG  DAILY MONDAY - FRIDAY ONLY.   aspirin EC 81 MG tablet Take 81 mg by mouth daily. Swallow whole.   atorvastatin 40 MG tablet Commonly known as: LIPITOR TAKE 1 TABLET BY MOUTH EVERY DAY   Breo Ellipta 200-25 MCG/ACT Aepb Generic drug: fluticasone furoate-vilanterol 1 puff daily.   CINNAMON PO Take 1,000 mg by mouth daily.   clopidogrel 75 MG tablet Commonly known as:  PLAVIX TAKE 1 TABLET BY MOUTH EVERY DAY WITH BREAKFAST   cyanocobalamin 1000 MCG tablet Take 1,000 mcg by mouth daily.   melatonin 5 MG Tabs Take 15 mg by mouth as needed.   metoprolol succinate 50 MG 24 hr tablet Commonly known as: TOPROL-XL Take 25 mg by mouth.   nitroGLYCERIN 0.4 MG SL tablet Commonly known as: NITROSTAT Place 1 tablet (0.4 mg total) under the tongue every 5 (five) minutes as needed for chest pain.   olmesartan 40 MG tablet Commonly known as: BENICAR Take 40 mg by mouth in the morning.   ondansetron 8 MG tablet Commonly known as: ZOFRAN TAKE 1 TABLET BY MOUTH EVERY 8 HOURS AS NEEDED FOR NAUSEA OR VOMITING.   polyethylene glycol 17 g packet Commonly known as: MIRALAX / GLYCOLAX Take 17 g by mouth daily.   traMADol 50 MG tablet Commonly known as: ULTRAM Take 50 mg by mouth 2 (two) times daily as needed (for pain).        Allergies: No Known Allergies  Past Medical History, Surgical history, Social history, and Family History were reviewed and updated.  Review of Systems: All other 10 point review of systems is negative.   Physical Exam:  vitals were not taken for this visit.   Wt Readings from Last 3 Encounters:  02/19/23 139 lb (63 kg)  01/21/23 140 lb 1.9 oz (63.6 kg)  01/03/23 141 lb (64 kg)    Ocular: Sclerae unicteric, pupils equal, round and reactive to light Ear-nose-throat: Oropharynx clear, dentition fair Lymphatic: No cervical or supraclavicular adenopathy Lungs no rales or rhonchi, good excursion bilaterally Heart regular rate and rhythm, no murmur appreciated Abd soft, nontender, positive bowel sounds MSK no focal spinal tenderness, no joint edema Neuro: non-focal, well-oriented, appropriate affect Breasts: Deferred   Lab Results  Component Value Date   WBC 9.2 03/18/2023   HGB 11.5 (L) 03/18/2023   HCT 36.4 (L) 03/18/2023   MCV 101.4 (H) 03/18/2023   PLT 209 03/18/2023   Lab Results  Component Value Date    FERRITIN 11 (L) 04/12/2022   IRON 36 (L) 04/12/2022   TIBC 330 04/12/2022   UIBC 294 04/12/2022   IRONPCTSAT 11 (L) 04/12/2022   Lab Results  Component Value Date   RETICCTPCT 1.9 04/12/2022   RBC 3.59 (L) 03/18/2023   Lab Results  Component Value Date   KPAFRELGTCHN 102.0 (H) 02/19/2023   LAMBDASER 7.4 02/19/2023   KAPLAMBRATIO 13.78 (H) 02/19/2023   Lab Results  Component Value Date   IGGSERUM 766 02/19/2023   IGA 110 02/19/2023   IGMSERUM 31 02/19/2023   Lab Results  Component Value Date   TOTALPROTELP 5.6 (L) 02/19/2023   ALBUMINELP 3.5 02/19/2023   A1GS 0.2 02/19/2023   A2GS 0.7 02/19/2023   BETS 0.7 02/19/2023   GAMS 0.5 02/19/2023   MSPIKE Not Observed 02/19/2023   SPEI Comment 02/19/2023     Chemistry      Component Value Date/Time   NA 140 03/04/2023 1013   K 4.1 03/04/2023 1013   CL 105 03/04/2023 1013   CO2 29 03/04/2023 1013   BUN 25 (H) 03/04/2023 1013   CREATININE 1.88 (H) 03/04/2023 1013      Component Value Date/Time   CALCIUM 8.7 (L) 03/04/2023 1013   ALKPHOS 48 03/04/2023 1013   AST 12 (L) 03/04/2023 1013   ALT 10 03/04/2023 1013   BILITOT 0.7 03/04/2023 1013       Impression and Plan: Shawn Bauer is a very pleasant 89 yo caucasian gentleman with IgG kappa myeloma.   Protein studies are pending.  Ok to proceed with Velcade per MD.  Lab and treatment every 2 weeks, follow-up in 4 weeks.   Eileen Stanford, NP 6/24/202410:02 AM

## 2023-03-19 ENCOUNTER — Other Ambulatory Visit: Payer: Self-pay

## 2023-03-19 LAB — KAPPA/LAMBDA LIGHT CHAINS
Kappa free light chain: 152.3 mg/L — ABNORMAL HIGH (ref 3.3–19.4)
Kappa, lambda light chain ratio: 13.36 — ABNORMAL HIGH (ref 0.26–1.65)
Lambda free light chains: 11.4 mg/L (ref 5.7–26.3)

## 2023-03-19 LAB — IGG, IGA, IGM
IgA: 114 mg/dL (ref 61–437)
IgG (Immunoglobin G), Serum: 668 mg/dL (ref 603–1613)
IgM (Immunoglobulin M), Srm: 30 mg/dL (ref 15–143)

## 2023-03-20 LAB — PROTEIN ELECTROPHORESIS, SERUM, WITH REFLEX
A/G Ratio: 1.6 (ref 0.7–1.7)
Albumin ELP: 3.5 g/dL (ref 2.9–4.4)
Alpha-1-Globulin: 0.2 g/dL (ref 0.0–0.4)
Alpha-2-Globulin: 0.7 g/dL (ref 0.4–1.0)
Beta Globulin: 0.7 g/dL (ref 0.7–1.3)
Gamma Globulin: 0.6 g/dL (ref 0.4–1.8)
Globulin, Total: 2.2 g/dL (ref 2.2–3.9)
Total Protein ELP: 5.7 g/dL — ABNORMAL LOW (ref 6.0–8.5)

## 2023-04-02 ENCOUNTER — Inpatient Hospital Stay: Payer: Medicare Other | Attending: Family

## 2023-04-02 ENCOUNTER — Inpatient Hospital Stay: Payer: Medicare Other

## 2023-04-02 VITALS — BP 125/58 | HR 78 | Temp 98.7°F | Resp 20

## 2023-04-02 DIAGNOSIS — Z5111 Encounter for antineoplastic chemotherapy: Secondary | ICD-10-CM | POA: Insufficient documentation

## 2023-04-02 DIAGNOSIS — C9000 Multiple myeloma not having achieved remission: Secondary | ICD-10-CM | POA: Insufficient documentation

## 2023-04-02 LAB — CBC WITH DIFFERENTIAL (CANCER CENTER ONLY)
Abs Immature Granulocytes: 0.06 10*3/uL (ref 0.00–0.07)
Basophils Absolute: 0.1 10*3/uL (ref 0.0–0.1)
Basophils Relative: 1 %
Eosinophils Absolute: 0.3 10*3/uL (ref 0.0–0.5)
Eosinophils Relative: 3 %
HCT: 37.1 % — ABNORMAL LOW (ref 39.0–52.0)
Hemoglobin: 11.9 g/dL — ABNORMAL LOW (ref 13.0–17.0)
Immature Granulocytes: 1 %
Lymphocytes Relative: 20 %
Lymphs Abs: 2 10*3/uL (ref 0.7–4.0)
MCH: 32.4 pg (ref 26.0–34.0)
MCHC: 32.1 g/dL (ref 30.0–36.0)
MCV: 101.1 fL — ABNORMAL HIGH (ref 80.0–100.0)
Monocytes Absolute: 0.9 10*3/uL (ref 0.1–1.0)
Monocytes Relative: 10 %
Neutro Abs: 6.4 10*3/uL (ref 1.7–7.7)
Neutrophils Relative %: 65 %
Platelet Count: 206 10*3/uL (ref 150–400)
RBC: 3.67 MIL/uL — ABNORMAL LOW (ref 4.22–5.81)
RDW: 14.5 % (ref 11.5–15.5)
WBC Count: 9.7 10*3/uL (ref 4.0–10.5)
nRBC: 0 % (ref 0.0–0.2)

## 2023-04-02 LAB — CMP (CANCER CENTER ONLY)
ALT: 12 U/L (ref 0–44)
AST: 13 U/L — ABNORMAL LOW (ref 15–41)
Albumin: 3.9 g/dL (ref 3.5–5.0)
Alkaline Phosphatase: 57 U/L (ref 38–126)
Anion gap: 7 (ref 5–15)
BUN: 24 mg/dL — ABNORMAL HIGH (ref 8–23)
CO2: 31 mmol/L (ref 22–32)
Calcium: 9.9 mg/dL (ref 8.9–10.3)
Chloride: 103 mmol/L (ref 98–111)
Creatinine: 2.03 mg/dL — ABNORMAL HIGH (ref 0.61–1.24)
GFR, Estimated: 31 mL/min — ABNORMAL LOW (ref >60)
Glucose, Bld: 160 mg/dL — ABNORMAL HIGH (ref 70–99)
Potassium: 4.8 mmol/L (ref 3.5–5.1)
Sodium: 141 mmol/L (ref 135–145)
Total Bilirubin: 0.8 mg/dL (ref 0.3–1.2)
Total Protein: 6.3 g/dL — ABNORMAL LOW (ref 6.5–8.1)

## 2023-04-02 LAB — LACTATE DEHYDROGENASE: LDH: 192 U/L (ref 98–192)

## 2023-04-02 MED ORDER — DEXAMETHASONE 4 MG PO TABS
20.0000 mg | ORAL_TABLET | Freq: Once | ORAL | Status: AC
  Administered 2023-04-02: 20 mg via ORAL
  Filled 2023-04-02: qty 5

## 2023-04-02 MED ORDER — PROCHLORPERAZINE MALEATE 10 MG PO TABS
10.0000 mg | ORAL_TABLET | Freq: Once | ORAL | Status: AC
  Administered 2023-04-02: 10 mg via ORAL
  Filled 2023-04-02: qty 1

## 2023-04-02 MED ORDER — BORTEZOMIB CHEMO SQ INJECTION 3.5 MG (2.5MG/ML)
1.3000 mg/m2 | Freq: Once | INTRAMUSCULAR | Status: AC
  Administered 2023-04-02: 2.25 mg via SUBCUTANEOUS
  Filled 2023-04-02: qty 0.9

## 2023-04-02 NOTE — Patient Instructions (Signed)
[facility]  Discharge Instructions: Thank you for choosing [facility] to provide your oncology and hematology care.   If you have a lab appointment with the Cancer Center, please go directly to the Cancer Center and check in at the registration area.  Wear comfortable clothing and clothing appropriate for easy access to any Portacath or PICC line.   We strive to give you quality time with your provider. You may need to reschedule your appointment if you arrive late (15 or more minutes).  Arriving late affects you and other patients whose appointments are after yours.  Also, if you miss three or more appointments without notifying the office, you may be dismissed from the clinic at the provider's discretion.      For prescription refill requests, have your pharmacy contact our office and allow 72 hours for refills to be completed.    Today you received the following chemotherapy and/or immunotherapy agents velcade     To help prevent nausea and vomiting after your treatment, we encourage you to take your nausea medication as directed.  BELOW ARE SYMPTOMS THAT SHOULD BE REPORTED IMMEDIATELY: *FEVER GREATER THAN 100.4 F (38 C) OR HIGHER *CHILLS OR SWEATING *NAUSEA AND VOMITING THAT IS NOT CONTROLLED WITH YOUR NAUSEA MEDICATION *UNUSUAL SHORTNESS OF BREATH *UNUSUAL BRUISING OR BLEEDING *URINARY PROBLEMS (pain or burning when urinating, or frequent urination) *BOWEL PROBLEMS (unusual diarrhea, constipation, pain near the anus) TENDERNESS IN MOUTH AND THROAT WITH OR WITHOUT PRESENCE OF ULCERS (sore throat, sores in mouth, or a toothache) UNUSUAL RASH, SWELLING OR PAIN  UNUSUAL VAGINAL DISCHARGE OR ITCHING   Items with * indicate a potential emergency and should be followed up as soon as possible or go to the Emergency Department if any problems should occur.  Please show the CHEMOTHERAPY ALERT CARD or IMMUNOTHERAPY ALERT CARD at check-in  to the Emergency Department and triage nurse. Should you have questions after your visit or need to cancel or reschedule your appointment, please contact [facility]  [phone] and follow the prompts.  Office hours are 8:00 a.m. to 4:30 p.m. Monday - Friday. Please note that voicemails left after 4:00 p.m. may not be returned until the following business day.  We are closed weekends and major holidays. You have access to a nurse at all times for urgent questions. Please call the main number to the clinic [phone] and follow the prompts.  For any non-urgent questions, you may also contact your provider using MyChart. We now offer e-Visits for anyone 18 and older to request care online for non-urgent symptoms. For details visit [email or web address].   Also download the MyChart app! Go to the app store, search "MyChart", open the app, select [place], and log in with your MyChart username and password.   

## 2023-04-04 LAB — UPEP/UIFE/LIGHT CHAINS/TP, 24-HR UR
% BETA, Urine: 42.3 %
ALPHA 1 URINE: 5.9 %
Albumin, U: 12.7 %
Alpha 2, Urine: 18.5 %
Free Kappa Lt Chains,Ur: 336.39 mg/L — ABNORMAL HIGH (ref 1.17–86.46)
Free Kappa/Lambda Ratio: 27.92 — ABNORMAL HIGH (ref 1.83–14.26)
Free Lambda Lt Chains,Ur: 12.05 mg/L (ref 0.27–15.21)
GAMMA GLOBULIN URINE: 20.5 %
M-SPIKE %, Urine: 11.5 % — ABNORMAL HIGH
M-Spike, Mg/24 Hr: 17 mg/24 hr — ABNORMAL HIGH
Total Protein, Urine-Ur/day: 149 mg/24 hr (ref 30–150)
Total Protein, Urine: 14.2 mg/dL
Total Volume: 1050

## 2023-04-05 ENCOUNTER — Telehealth: Payer: Self-pay

## 2023-04-05 NOTE — Telephone Encounter (Signed)
Called and informed patient's wife Shawn Bauer - request per pt) of lab results, patient verbalized understanding and denies any questions or concerns at this time.

## 2023-04-05 NOTE — Telephone Encounter (Signed)
-----   Message from Shawn Bauer sent at 04/04/2023  4:48 PM EDT ----- Please call let him know that the 24-hour urine looks somewhat better.  He has much much less abnormal protein in the urine now.  This is fantastic.  The chemotherapy is working well.  Cindee Lame

## 2023-04-16 ENCOUNTER — Encounter: Payer: Self-pay | Admitting: Medical Oncology

## 2023-04-16 ENCOUNTER — Inpatient Hospital Stay: Payer: Medicare Other

## 2023-04-16 ENCOUNTER — Encounter: Payer: Self-pay | Admitting: Hematology & Oncology

## 2023-04-16 ENCOUNTER — Other Ambulatory Visit: Payer: Self-pay

## 2023-04-16 ENCOUNTER — Inpatient Hospital Stay: Payer: Medicare Other | Admitting: Medical Oncology

## 2023-04-16 VITALS — BP 153/57 | HR 62 | Resp 18

## 2023-04-16 VITALS — BP 131/56 | HR 71 | Temp 98.4°F | Resp 18 | Ht 70.0 in | Wt 138.0 lb

## 2023-04-16 DIAGNOSIS — C9000 Multiple myeloma not having achieved remission: Secondary | ICD-10-CM

## 2023-04-16 DIAGNOSIS — N183 Chronic kidney disease, stage 3 unspecified: Secondary | ICD-10-CM

## 2023-04-16 DIAGNOSIS — Z5111 Encounter for antineoplastic chemotherapy: Secondary | ICD-10-CM | POA: Diagnosis not present

## 2023-04-16 LAB — CMP (CANCER CENTER ONLY)
ALT: 10 U/L (ref 0–44)
AST: 12 U/L — ABNORMAL LOW (ref 15–41)
Albumin: 3.7 g/dL (ref 3.5–5.0)
Alkaline Phosphatase: 54 U/L (ref 38–126)
Anion gap: 7 (ref 5–15)
BUN: 23 mg/dL (ref 8–23)
CO2: 30 mmol/L (ref 22–32)
Calcium: 8.9 mg/dL (ref 8.9–10.3)
Chloride: 103 mmol/L (ref 98–111)
Creatinine: 1.81 mg/dL — ABNORMAL HIGH (ref 0.61–1.24)
GFR, Estimated: 36 mL/min — ABNORMAL LOW (ref >60)
Glucose, Bld: 170 mg/dL — ABNORMAL HIGH (ref 70–99)
Potassium: 4 mmol/L (ref 3.5–5.1)
Sodium: 140 mmol/L (ref 135–145)
Total Bilirubin: 0.6 mg/dL (ref 0.3–1.2)
Total Protein: 5.7 g/dL — ABNORMAL LOW (ref 6.5–8.1)

## 2023-04-16 LAB — CBC WITH DIFFERENTIAL (CANCER CENTER ONLY)
Abs Immature Granulocytes: 0.05 10*3/uL (ref 0.00–0.07)
Basophils Absolute: 0.1 10*3/uL (ref 0.0–0.1)
Basophils Relative: 1 %
Eosinophils Absolute: 0.3 10*3/uL (ref 0.0–0.5)
Eosinophils Relative: 3 %
HCT: 34.2 % — ABNORMAL LOW (ref 39.0–52.0)
Hemoglobin: 11 g/dL — ABNORMAL LOW (ref 13.0–17.0)
Immature Granulocytes: 1 %
Lymphocytes Relative: 19 %
Lymphs Abs: 1.8 10*3/uL (ref 0.7–4.0)
MCH: 32.4 pg (ref 26.0–34.0)
MCHC: 32.2 g/dL (ref 30.0–36.0)
MCV: 100.9 fL — ABNORMAL HIGH (ref 80.0–100.0)
Monocytes Absolute: 0.9 10*3/uL (ref 0.1–1.0)
Monocytes Relative: 9 %
Neutro Abs: 6.5 10*3/uL (ref 1.7–7.7)
Neutrophils Relative %: 67 %
Platelet Count: 180 10*3/uL (ref 150–400)
RBC: 3.39 MIL/uL — ABNORMAL LOW (ref 4.22–5.81)
RDW: 14.5 % (ref 11.5–15.5)
WBC Count: 9.6 10*3/uL (ref 4.0–10.5)
nRBC: 0 % (ref 0.0–0.2)

## 2023-04-16 LAB — LACTATE DEHYDROGENASE: LDH: 171 U/L (ref 98–192)

## 2023-04-16 MED ORDER — BORTEZOMIB CHEMO SQ INJECTION 3.5 MG (2.5MG/ML)
1.3000 mg/m2 | Freq: Once | INTRAMUSCULAR | Status: AC
  Administered 2023-04-16: 2.25 mg via SUBCUTANEOUS
  Filled 2023-04-16: qty 0.9

## 2023-04-16 MED ORDER — DENOSUMAB 120 MG/1.7ML ~~LOC~~ SOLN
120.0000 mg | Freq: Once | SUBCUTANEOUS | Status: AC
  Administered 2023-04-16: 120 mg via SUBCUTANEOUS
  Filled 2023-04-16: qty 1.7

## 2023-04-16 MED ORDER — DEXAMETHASONE 4 MG PO TABS
20.0000 mg | ORAL_TABLET | Freq: Once | ORAL | Status: AC
  Administered 2023-04-16: 20 mg via ORAL
  Filled 2023-04-16: qty 5

## 2023-04-16 MED ORDER — PROCHLORPERAZINE MALEATE 10 MG PO TABS
10.0000 mg | ORAL_TABLET | Freq: Once | ORAL | Status: AC
  Administered 2023-04-16: 10 mg via ORAL
  Filled 2023-04-16: qty 1

## 2023-04-16 NOTE — Progress Notes (Signed)
Hematology and Oncology Follow Up Visit  Shawn Bauer [facility] [id number] 10-02-1933 89 y.o. 04/16/2023   Principle Diagnosis:  IgG Kappa myeloma -- high risk due to t(4:21)   Current Therapy:        Velcade/Decadron -- started 10/09/2019, s/p cycle 34 - treatment frequency changed to every other week on 01/21/2021 now s/p cycle 20 Xgeva 120 mg IM q 3 months -- next dose on 03/2023      Interim History:  Shawn Bauer is here today for follow-up and treatment.   No M-spike noted last month in June, IgG level was 668 mg/dL and kappa light chains were 15.2 mg/dL (previously (08.6 mg/dL).   No issue with infections. No fever, chills, n/v, cough, rash, dizziness, chest pain, palpitations, abdominal pain or changes in bowel or bladder habits.  Mild SOB with over exertion at times. He will take a break to rest when needed. He is trying to stay out of the heat this summer.   No swelling in his extremities. 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  No falls or syncope reported. He ambulates with his cane for added support.  Appetite and hydration are good.   Wt Readings from Last 3 Encounters:  04/16/23 138 lb (62.6 kg)  03/18/23 138 lb 1.9 oz (62.7 kg)  02/19/23 139 lb (63 kg)     ECOG Performance Status: 1 - Symptomatic but completely ambulatory  Medications:  Allergies as of 04/16/2023   No Known Allergies      Medication List        Accurate as of April 16, 2023  9:29 AM. If you have any questions, ask your nurse or doctor.          AMIODARONE HCL PO Amiodarone HCl   amiodarone 200 MG tablet Commonly known as: PACERONE TAKE 1 TABLET (200 MG TOTAL) BY MOUTH AS DIRECTED. TAKE 200MG  DAILY MONDAY - FRIDAY ONLY.   aspirin EC 81 MG tablet Take 81 mg by mouth daily. Swallow whole.   atorvastatin 40 MG tablet Commonly known as: LIPITOR TAKE 1 TABLET BY MOUTH EVERY DAY   Breo Ellipta 200-25 MCG/ACT Aepb Generic drug: fluticasone furoate-vilanterol 1 puff daily.    CINNAMON PO Take 1,000 mg by mouth daily.   clopidogrel 75 MG tablet Commonly known as: PLAVIX TAKE 1 TABLET BY MOUTH EVERY DAY WITH BREAKFAST   cyanocobalamin 1000 MCG tablet Take 1,000 mcg by mouth daily.   melatonin 5 MG Tabs Take 15 mg by mouth as needed.   metoprolol succinate 50 MG 24 hr tablet Commonly known as: TOPROL-XL Take 25 mg by mouth.   nitroGLYCERIN 0.4 MG SL tablet Commonly known as: NITROSTAT Place 1 tablet (0.4 mg total) under the tongue every 5 (five) minutes as needed for chest pain.   olmesartan 40 MG tablet Commonly known as: BENICAR Take 40 mg by mouth in the morning.   ondansetron 8 MG tablet Commonly known as: ZOFRAN TAKE 1 TABLET BY MOUTH EVERY 8 HOURS AS NEEDED FOR NAUSEA OR VOMITING.   polyethylene glycol 17 g packet Commonly known as: MIRALAX / GLYCOLAX Take 17 g by mouth daily.   traMADol 50 MG tablet Commonly known as: ULTRAM Take 50 mg by mouth 2 (two) times daily as needed (for pain).        Allergies: No Known Allergies  Past Medical History, Surgical history, Social history, and Family History were reviewed and updated.  Review of Systems: All other 10 point review of systems is negative.  Physical Exam:  height is 5\' 10"  (1.778 m) and weight is 138 lb (62.6 kg). His oral temperature is 98.4 F (36.9 C). His blood pressure is 131/56 (abnormal) and his pulse is 71. His respiration is 18 and oxygen saturation is 96%.   Wt Readings from Last 3 Encounters:  04/16/23 138 lb (62.6 kg)  03/18/23 138 lb 1.9 oz (62.7 kg)  02/19/23 139 lb (63 kg)   Constitutional: Ambulating well with a cane  Ocular: Sclerae unicteric, pupils equal, round and reactive to light Ear-nose-throat: Oropharynx clear, dentition fair Lymphatic: No cervical or supraclavicular adenopathy Lungs no rales or rhonchi, good excursion bilaterally Heart regular rate and rhythm, no murmur appreciated Abd soft MSK no focal spinal tenderness, no joint  edema Neuro: non-focal, well-oriented, appropriate affect  Lab Results  Component Value Date   WBC 9.6 04/16/2023   HGB 11.0 (L) 04/16/2023   HCT 34.2 (L) 04/16/2023   MCV 100.9 (H) 04/16/2023   PLT 180 04/16/2023   Lab Results  Component Value Date   FERRITIN 11 (L) 04/12/2022   IRON 36 (L) 04/12/2022   TIBC 330 04/12/2022   UIBC 294 04/12/2022   IRONPCTSAT 11 (L) 04/12/2022   Lab Results  Component Value Date   RETICCTPCT 1.9 04/12/2022   RBC 3.39 (L) 04/16/2023   Lab Results  Component Value Date   KPAFRELGTCHN 152.3 (H) 03/18/2023   LAMBDASER 11.4 03/18/2023   KAPLAMBRATIO 27.92 (H) 04/02/2023   Lab Results  Component Value Date   IGGSERUM 668 03/18/2023   IGA 114 03/18/2023   IGMSERUM 30 03/18/2023   Lab Results  Component Value Date   TOTALPROTELP 5.7 (L) 03/18/2023   ALBUMINELP 3.5 03/18/2023   A1GS 0.2 03/18/2023   A2GS 0.7 03/18/2023   BETS 0.7 03/18/2023   GAMS 0.6 03/18/2023   MSPIKE Not Observed 03/18/2023   SPEI Comment 02/19/2023     Chemistry      Component Value Date/Time   NA 141 04/02/2023 0845   K 4.8 04/02/2023 0845   CL 103 04/02/2023 0845   CO2 31 04/02/2023 0845   BUN 24 (H) 04/02/2023 0845   CREATININE 2.03 (H) 04/02/2023 0845      Component Value Date/Time   CALCIUM 9.9 04/02/2023 0845   ALKPHOS 57 04/02/2023 0845   AST 13 (L) 04/02/2023 0845   ALT 12 04/02/2023 0845   BILITOT 0.8 04/02/2023 0845      Impression and Plan: Shawn Bauer is a very pleasant 89 yo caucasian gentleman with IgG kappa myeloma.    Protein studies are pending.  CBC and CMP reviewed and acceptable for treatment and xgeva today  Lab and treatment every 2 weeks, follow-up in 4 weeks with MD with labs.    Rushie Chestnut, PA-C 7/23/20249:29 AM

## 2023-04-16 NOTE — Patient Instructions (Signed)
[facility]  Discharge Instructions: Thank you for choosing [facility] to provide your oncology and hematology care.   If you have a lab appointment with the Cancer Center, please go directly to the Cancer Center and check in at the registration area.  Wear comfortable clothing and clothing appropriate for easy access to any Portacath or PICC line.   We strive to give you quality time with your provider. You may need to reschedule your appointment if you arrive late (15 or more minutes).  Arriving late affects you and other patients whose appointments are after yours.  Also, if you miss three or more appointments without notifying the office, you may be dismissed from the clinic at the provider's discretion.      For prescription refill requests, have your pharmacy contact our office and allow 72 hours for refills to be completed.    Today you received the following chemotherapy and/or immunotherapy agents Velcade.      To help prevent nausea and vomiting after your treatment, we encourage you to take your nausea medication as directed.  BELOW ARE SYMPTOMS THAT SHOULD BE REPORTED IMMEDIATELY: *FEVER GREATER THAN 100.4 F (38 C) OR HIGHER *CHILLS OR SWEATING *NAUSEA AND VOMITING THAT IS NOT CONTROLLED WITH YOUR NAUSEA MEDICATION *UNUSUAL SHORTNESS OF BREATH *UNUSUAL BRUISING OR BLEEDING *URINARY PROBLEMS (pain or burning when urinating, or frequent urination) *BOWEL PROBLEMS (unusual diarrhea, constipation, pain near the anus) TENDERNESS IN MOUTH AND THROAT WITH OR WITHOUT PRESENCE OF ULCERS (sore throat, sores in mouth, or a toothache) UNUSUAL RASH, SWELLING OR PAIN  UNUSUAL VAGINAL DISCHARGE OR ITCHING   Items with * indicate a potential emergency and should be followed up as soon as possible or go to the Emergency Department if any problems should occur.  Please show the CHEMOTHERAPY ALERT CARD or IMMUNOTHERAPY ALERT CARD at check-in  to the Emergency Department and triage nurse. Should you have questions after your visit or need to cancel or reschedule your appointment, please contact [facility]  [phone] and follow the prompts.  Office hours are 8:00 a.m. to 4:30 p.m. Monday - Friday. Please note that voicemails left after 4:00 p.m. may not be returned until the following business day.  We are closed weekends and major holidays. You have access to a nurse at all times for urgent questions. Please call the main number to the clinic [phone] and follow the prompts.  For any non-urgent questions, you may also contact your provider using MyChart. We now offer e-Visits for anyone 18 and older to request care online for non-urgent symptoms. For details visit [email or web address].   Also download the MyChart app! Go to the app store, search "MyChart", open the app, select [place], and log in with your MyChart username and password.   

## 2023-04-17 ENCOUNTER — Other Ambulatory Visit: Payer: Self-pay

## 2023-04-17 LAB — KAPPA/LAMBDA LIGHT CHAINS
Kappa free light chain: 122.5 mg/L — ABNORMAL HIGH (ref 3.3–19.4)
Kappa, lambda light chain ratio: 12.25 — ABNORMAL HIGH (ref 0.26–1.65)
Lambda free light chains: 10 mg/L (ref 5.7–26.3)

## 2023-04-18 LAB — PROTEIN ELECTROPHORESIS, SERUM
A/G Ratio: 1.5 (ref 0.7–1.7)
Albumin ELP: 3.2 g/dL (ref 2.9–4.4)
Alpha-1-Globulin: 0.2 g/dL (ref 0.0–0.4)
Alpha-2-Globulin: 0.7 g/dL (ref 0.4–1.0)
Beta Globulin: 0.7 g/dL (ref 0.7–1.3)
Gamma Globulin: 0.6 g/dL (ref 0.4–1.8)
Globulin, Total: 2.2 g/dL (ref 2.2–3.9)

## 2023-04-18 LAB — IGG, IGA, IGM
IgA: 101 mg/dL (ref 61–437)
IgG (Immunoglobin G), Serum: 618 mg/dL (ref 603–1613)
IgM (Immunoglobulin M), Srm: 36 mg/dL (ref 15–143)

## 2023-04-30 ENCOUNTER — Inpatient Hospital Stay: Payer: Medicare Other | Attending: Family

## 2023-04-30 ENCOUNTER — Inpatient Hospital Stay: Payer: Medicare Other

## 2023-04-30 VITALS — BP 130/58 | HR 62 | Temp 98.2°F | Resp 16

## 2023-04-30 DIAGNOSIS — C9000 Multiple myeloma not having achieved remission: Secondary | ICD-10-CM | POA: Diagnosis present

## 2023-04-30 DIAGNOSIS — Z5111 Encounter for antineoplastic chemotherapy: Secondary | ICD-10-CM | POA: Diagnosis present

## 2023-04-30 LAB — CMP (CANCER CENTER ONLY)
ALT: 11 U/L (ref 0–44)
AST: 13 U/L — ABNORMAL LOW (ref 15–41)
Albumin: 3.8 g/dL (ref 3.5–5.0)
Alkaline Phosphatase: 44 U/L (ref 38–126)
Anion gap: 5 (ref 5–15)
BUN: 26 mg/dL — ABNORMAL HIGH (ref 8–23)
CO2: 32 mmol/L (ref 22–32)
Calcium: 9.1 mg/dL (ref 8.9–10.3)
Chloride: 103 mmol/L (ref 98–111)
Creatinine: 1.92 mg/dL — ABNORMAL HIGH (ref 0.61–1.24)
GFR, Estimated: 34 mL/min — ABNORMAL LOW (ref >60)
Glucose, Bld: 155 mg/dL — ABNORMAL HIGH (ref 70–99)
Potassium: 4.7 mmol/L (ref 3.5–5.1)
Sodium: 140 mmol/L (ref 135–145)
Total Bilirubin: 0.7 mg/dL (ref 0.3–1.2)
Total Protein: 6.1 g/dL — ABNORMAL LOW (ref 6.5–8.1)

## 2023-04-30 LAB — CBC WITH DIFFERENTIAL (CANCER CENTER ONLY)
Abs Immature Granulocytes: 0.04 10*3/uL (ref 0.00–0.07)
Basophils Absolute: 0.1 10*3/uL (ref 0.0–0.1)
Basophils Relative: 1 %
Eosinophils Absolute: 0.4 10*3/uL (ref 0.0–0.5)
Eosinophils Relative: 4 %
HCT: 36 % — ABNORMAL LOW (ref 39.0–52.0)
Hemoglobin: 11.2 g/dL — ABNORMAL LOW (ref 13.0–17.0)
Immature Granulocytes: 0 %
Lymphocytes Relative: 27 %
Lymphs Abs: 2.4 10*3/uL (ref 0.7–4.0)
MCH: 31.8 pg (ref 26.0–34.0)
MCHC: 31.1 g/dL (ref 30.0–36.0)
MCV: 102.3 fL — ABNORMAL HIGH (ref 80.0–100.0)
Monocytes Absolute: 0.9 10*3/uL (ref 0.1–1.0)
Monocytes Relative: 10 %
Neutro Abs: 5.1 10*3/uL (ref 1.7–7.7)
Neutrophils Relative %: 58 %
Platelet Count: 190 10*3/uL (ref 150–400)
RBC: 3.52 MIL/uL — ABNORMAL LOW (ref 4.22–5.81)
RDW: 14.6 % (ref 11.5–15.5)
WBC Count: 8.9 10*3/uL (ref 4.0–10.5)
nRBC: 0 % (ref 0.0–0.2)

## 2023-04-30 MED ORDER — BORTEZOMIB CHEMO SQ INJECTION 3.5 MG (2.5MG/ML)
1.3000 mg/m2 | Freq: Once | INTRAMUSCULAR | Status: AC
  Administered 2023-04-30: 2.25 mg via SUBCUTANEOUS
  Filled 2023-04-30: qty 0.9

## 2023-04-30 MED ORDER — PROCHLORPERAZINE MALEATE 10 MG PO TABS
10.0000 mg | ORAL_TABLET | Freq: Once | ORAL | Status: AC
  Administered 2023-04-30: 10 mg via ORAL
  Filled 2023-04-30: qty 1

## 2023-04-30 MED ORDER — DEXAMETHASONE 4 MG PO TABS
20.0000 mg | ORAL_TABLET | Freq: Once | ORAL | Status: AC
  Administered 2023-04-30: 20 mg via ORAL
  Filled 2023-04-30: qty 5

## 2023-04-30 NOTE — Progress Notes (Signed)
Ok to treat with Creatinine of 1.92 today per Eileen Stanford NP

## 2023-04-30 NOTE — Patient Instructions (Signed)
[facility]  Discharge Instructions: Thank you for choosing [facility] to provide your oncology and hematology care.   If you have a lab appointment with the Cancer Center, please go directly to the Cancer Center and check in at the registration area.  Wear comfortable clothing and clothing appropriate for easy access to any Portacath or PICC line.   We strive to give you quality time with your provider. You may need to reschedule your appointment if you arrive late (15 or more minutes).  Arriving late affects you and other patients whose appointments are after yours.  Also, if you miss three or more appointments without notifying the office, you may be dismissed from the clinic at the provider's discretion.      For prescription refill requests, have your pharmacy contact our office and allow 72 hours for refills to be completed.    Today you received the following chemotherapy and/or immunotherapy agents Velcade.      To help prevent nausea and vomiting after your treatment, we encourage you to take your nausea medication as directed.  BELOW ARE SYMPTOMS THAT SHOULD BE REPORTED IMMEDIATELY: *FEVER GREATER THAN 100.4 F (38 C) OR HIGHER *CHILLS OR SWEATING *NAUSEA AND VOMITING THAT IS NOT CONTROLLED WITH YOUR NAUSEA MEDICATION *UNUSUAL SHORTNESS OF BREATH *UNUSUAL BRUISING OR BLEEDING *URINARY PROBLEMS (pain or burning when urinating, or frequent urination) *BOWEL PROBLEMS (unusual diarrhea, constipation, pain near the anus) TENDERNESS IN MOUTH AND THROAT WITH OR WITHOUT PRESENCE OF ULCERS (sore throat, sores in mouth, or a toothache) UNUSUAL RASH, SWELLING OR PAIN  UNUSUAL VAGINAL DISCHARGE OR ITCHING   Items with * indicate a potential emergency and should be followed up as soon as possible or go to the Emergency Department if any problems should occur.  Please show the CHEMOTHERAPY ALERT CARD or IMMUNOTHERAPY ALERT CARD at check-in  to the Emergency Department and triage nurse. Should you have questions after your visit or need to cancel or reschedule your appointment, please contact [facility]  [phone] and follow the prompts.  Office hours are 8:00 a.m. to 4:30 p.m. Monday - Friday. Please note that voicemails left after 4:00 p.m. may not be returned until the following business day.  We are closed weekends and major holidays. You have access to a nurse at all times for urgent questions. Please call the main number to the clinic [phone] and follow the prompts.  For any non-urgent questions, you may also contact your provider using MyChart. We now offer e-Visits for anyone 18 and older to request care online for non-urgent symptoms. For details visit [email or web address].   Also download the MyChart app! Go to the app store, search "MyChart", open the app, select [place], and log in with your MyChart username and password.   

## 2023-05-14 ENCOUNTER — Inpatient Hospital Stay: Payer: Medicare Other | Admitting: Medical Oncology

## 2023-05-14 ENCOUNTER — Inpatient Hospital Stay: Payer: Medicare Other

## 2023-05-14 ENCOUNTER — Encounter: Payer: Self-pay | Admitting: Medical Oncology

## 2023-05-14 ENCOUNTER — Other Ambulatory Visit: Payer: Self-pay | Admitting: Medical Oncology

## 2023-05-14 ENCOUNTER — Other Ambulatory Visit: Payer: Self-pay

## 2023-05-14 VITALS — BP 123/53 | HR 68 | Temp 98.1°F | Resp 18 | Ht 70.0 in | Wt 139.1 lb

## 2023-05-14 DIAGNOSIS — C9000 Multiple myeloma not having achieved remission: Secondary | ICD-10-CM

## 2023-05-14 DIAGNOSIS — Z5111 Encounter for antineoplastic chemotherapy: Secondary | ICD-10-CM | POA: Diagnosis not present

## 2023-05-14 DIAGNOSIS — D7589 Other specified diseases of blood and blood-forming organs: Secondary | ICD-10-CM

## 2023-05-14 LAB — CBC WITH DIFFERENTIAL (CANCER CENTER ONLY)
Abs Immature Granulocytes: 0.04 10*3/uL (ref 0.00–0.07)
Basophils Absolute: 0.1 10*3/uL (ref 0.0–0.1)
Basophils Relative: 1 %
Eosinophils Absolute: 0.4 10*3/uL (ref 0.0–0.5)
Eosinophils Relative: 5 %
HCT: 34.1 % — ABNORMAL LOW (ref 39.0–52.0)
Hemoglobin: 10.6 g/dL — ABNORMAL LOW (ref 13.0–17.0)
Immature Granulocytes: 1 %
Lymphocytes Relative: 23 %
Lymphs Abs: 1.8 10*3/uL (ref 0.7–4.0)
MCH: 32 pg (ref 26.0–34.0)
MCHC: 31.1 g/dL (ref 30.0–36.0)
MCV: 103 fL — ABNORMAL HIGH (ref 80.0–100.0)
Monocytes Absolute: 0.7 10*3/uL (ref 0.1–1.0)
Monocytes Relative: 9 %
Neutro Abs: 4.9 10*3/uL (ref 1.7–7.7)
Neutrophils Relative %: 61 %
Platelet Count: 181 10*3/uL (ref 150–400)
RBC: 3.31 MIL/uL — ABNORMAL LOW (ref 4.22–5.81)
RDW: 14.6 % (ref 11.5–15.5)
WBC Count: 7.9 10*3/uL (ref 4.0–10.5)
nRBC: 0 % (ref 0.0–0.2)

## 2023-05-14 LAB — CMP (CANCER CENTER ONLY)
ALT: 9 U/L (ref 0–44)
AST: 12 U/L — ABNORMAL LOW (ref 15–41)
Albumin: 3.7 g/dL (ref 3.5–5.0)
Alkaline Phosphatase: 44 U/L (ref 38–126)
Anion gap: 7 (ref 5–15)
BUN: 28 mg/dL — ABNORMAL HIGH (ref 8–23)
CO2: 29 mmol/L (ref 22–32)
Calcium: 8.6 mg/dL — ABNORMAL LOW (ref 8.9–10.3)
Chloride: 104 mmol/L (ref 98–111)
Creatinine: 1.83 mg/dL — ABNORMAL HIGH (ref 0.61–1.24)
GFR, Estimated: 35 mL/min — ABNORMAL LOW (ref >60)
Glucose, Bld: 156 mg/dL — ABNORMAL HIGH (ref 70–99)
Potassium: 4.1 mmol/L (ref 3.5–5.1)
Sodium: 140 mmol/L (ref 135–145)
Total Bilirubin: 0.6 mg/dL (ref 0.3–1.2)
Total Protein: 5.7 g/dL — ABNORMAL LOW (ref 6.5–8.1)

## 2023-05-14 LAB — LACTATE DEHYDROGENASE: LDH: 178 U/L (ref 98–192)

## 2023-05-14 MED ORDER — BORTEZOMIB CHEMO SQ INJECTION 3.5 MG (2.5MG/ML)
1.3000 mg/m2 | Freq: Once | INTRAMUSCULAR | Status: AC
  Administered 2023-05-14: 2.25 mg via SUBCUTANEOUS
  Filled 2023-05-14: qty 0.9

## 2023-05-14 MED ORDER — PROCHLORPERAZINE MALEATE 10 MG PO TABS
10.0000 mg | ORAL_TABLET | Freq: Once | ORAL | Status: AC
  Administered 2023-05-14: 10 mg via ORAL
  Filled 2023-05-14: qty 1

## 2023-05-14 MED ORDER — DEXAMETHASONE 4 MG PO TABS
20.0000 mg | ORAL_TABLET | Freq: Once | ORAL | Status: AC
  Administered 2023-05-14: 20 mg via ORAL
  Filled 2023-05-14: qty 5

## 2023-05-14 NOTE — Progress Notes (Signed)
Ok to treat with creatinine 1.83 per Clent Jacks, PA today

## 2023-05-14 NOTE — Patient Instructions (Signed)
[facility]  Discharge Instructions: Thank you for choosing [facility] to provide your oncology and hematology care.   If you have a lab appointment with the Cancer Center, please go directly to the Cancer Center and check in at the registration area.  Wear comfortable clothing and clothing appropriate for easy access to any Portacath or PICC line.   We strive to give you quality time with your provider. You may need to reschedule your appointment if you arrive late (15 or more minutes).  Arriving late affects you and other patients whose appointments are after yours.  Also, if you miss three or more appointments without notifying the office, you may be dismissed from the clinic at the provider's discretion.      For prescription refill requests, have your pharmacy contact our office and allow 72 hours for refills to be completed.    Today you received the following chemotherapy and/or immunotherapy agents Velcade.      To help prevent nausea and vomiting after your treatment, we encourage you to take your nausea medication as directed.  BELOW ARE SYMPTOMS THAT SHOULD BE REPORTED IMMEDIATELY: *FEVER GREATER THAN 100.4 F (38 C) OR HIGHER *CHILLS OR SWEATING *NAUSEA AND VOMITING THAT IS NOT CONTROLLED WITH YOUR NAUSEA MEDICATION *UNUSUAL SHORTNESS OF BREATH *UNUSUAL BRUISING OR BLEEDING *URINARY PROBLEMS (pain or burning when urinating, or frequent urination) *BOWEL PROBLEMS (unusual diarrhea, constipation, pain near the anus) TENDERNESS IN MOUTH AND THROAT WITH OR WITHOUT PRESENCE OF ULCERS (sore throat, sores in mouth, or a toothache) UNUSUAL RASH, SWELLING OR PAIN  UNUSUAL VAGINAL DISCHARGE OR ITCHING   Items with * indicate a potential emergency and should be followed up as soon as possible or go to the Emergency Department if any problems should occur.  Please show the CHEMOTHERAPY ALERT CARD or IMMUNOTHERAPY ALERT CARD at check-in  to the Emergency Department and triage nurse. Should you have questions after your visit or need to cancel or reschedule your appointment, please contact [facility]  [phone] and follow the prompts.  Office hours are 8:00 a.m. to 4:30 p.m. Monday - Friday. Please note that voicemails left after 4:00 p.m. may not be returned until the following business day.  We are closed weekends and major holidays. You have access to a nurse at all times for urgent questions. Please call the main number to the clinic [phone] and follow the prompts.  For any non-urgent questions, you may also contact your provider using MyChart. We now offer e-Visits for anyone 18 and older to request care online for non-urgent symptoms. For details visit [email or web address].   Also download the MyChart app! Go to the app store, search "MyChart", open the app, select [place], and log in with your MyChart username and password.   

## 2023-05-14 NOTE — Progress Notes (Signed)
Hematology and Oncology Follow Up Visit  Shawn Bauer [facility] [id number] December 31, 1933 89 y.o. 05/14/2023   Principle Diagnosis:  IgG Kappa myeloma -- high risk due to t(4:21)   Current Therapy:        Velcade/Decadron -- started 10/09/2019, s/p cycle 34 - treatment frequency changed to every other week on 01/21/2021 now s/p cycle 20 Xgeva 120 mg IM q 3 months -- next dose on 06/2023      Interim History:  Shawn Bauer is here today for follow-up and treatment.   No M-spike noted last month in July, IgG level was 618 mg/dL and kappa light chains were 12.2 mg/dL   No issue with infections. No fever, chills, n/v, cough, rash, dizziness, chest pain, palpitations, abdominal pain or changes in bowel or bladder habits.  Mild SOB with over exertion at times. He will take a break to rest when needed. He is trying to stay out of the heat this summer.   No swelling in his extremities. 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   He is feeling well.   No falls or syncope reported. He ambulates with his cane for added support.  Appetite and hydration are stable. He states that he does not eat much but is eating the same amount he normally does.   Wt Readings from Last 3 Encounters:  05/14/23 139 lb 1.9 oz (63.1 kg)  04/16/23 138 lb (62.6 kg)  03/18/23 138 lb 1.9 oz (62.7 kg)     ECOG Performance Status: 1 - Symptomatic but completely ambulatory  Medications:  Allergies as of 05/14/2023   No Known Allergies      Medication List        Accurate as of May 14, 2023 10:30 AM. If you have any questions, ask your nurse or doctor.          STOP taking these medications    melatonin 5 MG Tabs Stopped by: Rushie Chestnut       TAKE these medications    amiodarone 200 MG tablet Commonly known as: PACERONE TAKE 1 TABLET (200 MG TOTAL) BY MOUTH AS DIRECTED. TAKE 200MG  DAILY MONDAY - FRIDAY ONLY. What changed: Another medication with the same name was removed. Continue  taking this medication, and follow the directions you see here. Changed by: Rushie Chestnut   aspirin EC 81 MG tablet Take 81 mg by mouth daily. Swallow whole.   atorvastatin 40 MG tablet Commonly known as: LIPITOR TAKE 1 TABLET BY MOUTH EVERY DAY   Breo Ellipta 200-25 MCG/ACT Aepb Generic drug: fluticasone furoate-vilanterol 1 puff daily.   CINNAMON PO Take 1,000 mg by mouth daily.   clopidogrel 75 MG tablet Commonly known as: PLAVIX TAKE 1 TABLET BY MOUTH EVERY DAY WITH BREAKFAST   cyanocobalamin 1000 MCG tablet Take 1,000 mcg by mouth daily.   metoprolol succinate 50 MG 24 hr tablet Commonly known as: TOPROL-XL Take 25 mg by mouth.   nitroGLYCERIN 0.4 MG SL tablet Commonly known as: NITROSTAT Place 1 tablet (0.4 mg total) under the tongue every 5 (five) minutes as needed for chest pain.   olmesartan 40 MG tablet Commonly known as: BENICAR Take 40 mg by mouth in the morning.   ondansetron 8 MG tablet Commonly known as: ZOFRAN TAKE 1 TABLET BY MOUTH EVERY 8 HOURS AS NEEDED FOR NAUSEA OR VOMITING.   polyethylene glycol 17 g packet Commonly known as: MIRALAX / GLYCOLAX Take 17 g by mouth daily.   traMADol 50 MG tablet  Commonly known as: ULTRAM Take 50 mg by mouth 2 (two) times daily as needed (for pain).        Allergies: No Known Allergies  Past Medical History, Surgical history, Social history, and Family History were reviewed and updated.  Review of Systems: All other 10 point review of systems is negative.   Physical Exam:  height is 5\' 10"  (1.778 m) and weight is 139 lb 1.9 oz (63.1 kg). His oral temperature is 98.1 F (36.7 C). His blood pressure is 123/53 (abnormal) and his pulse is 68. His respiration is 18 and oxygen saturation is 100%.   Wt Readings from Last 3 Encounters:  05/14/23 139 lb 1.9 oz (63.1 kg)  04/16/23 138 lb (62.6 kg)  03/18/23 138 lb 1.9 oz (62.7 kg)   Constitutional: Ambulating well with a cane  Ocular: Sclerae  unicteric, pupils equal, round and reactive to light Ear-nose-throat: Oropharynx clear, dentition fair Lymphatic: No cervical or supraclavicular adenopathy Lungs no rales or rhonchi, good excursion bilaterally Heart regular rate and rhythm, no murmur appreciated Abd soft MSK no focal spinal tenderness, no joint edema Neuro: non-focal, well-oriented, appropriate affect  Lab Results  Component Value Date   WBC 7.9 05/14/2023   HGB 10.6 (L) 05/14/2023   HCT 34.1 (L) 05/14/2023   MCV 103.0 (H) 05/14/2023   PLT 181 05/14/2023   Lab Results  Component Value Date   FERRITIN 11 (L) 04/12/2022   IRON 36 (L) 04/12/2022   TIBC 330 04/12/2022   UIBC 294 04/12/2022   IRONPCTSAT 11 (L) 04/12/2022   Lab Results  Component Value Date   RETICCTPCT 1.9 04/12/2022   RBC 3.31 (L) 05/14/2023   Lab Results  Component Value Date   KPAFRELGTCHN 122.5 (H) 04/16/2023   LAMBDASER 10.0 04/16/2023   KAPLAMBRATIO 12.25 (H) 04/16/2023   Lab Results  Component Value Date   IGGSERUM 618 04/16/2023   IGA 101 04/16/2023   IGMSERUM 36 04/16/2023   Lab Results  Component Value Date   TOTALPROTELP 5.4 (L) 04/16/2023   ALBUMINELP 3.2 04/16/2023   A1GS 0.2 04/16/2023   A2GS 0.7 04/16/2023   BETS 0.7 04/16/2023   GAMS 0.6 04/16/2023   MSPIKE Not Observed 04/16/2023   SPEI Comment 04/16/2023     Chemistry      Component Value Date/Time   NA 140 05/14/2023 0945   K 4.1 05/14/2023 0945   CL 104 05/14/2023 0945   CO2 29 05/14/2023 0945   BUN 28 (H) 05/14/2023 0945   CREATININE 1.83 (H) 05/14/2023 0945      Component Value Date/Time   CALCIUM 8.6 (L) 05/14/2023 0945   ALKPHOS 44 05/14/2023 0945   AST 12 (L) 05/14/2023 0945   ALT 9 05/14/2023 0945   BILITOT 0.6 05/14/2023 0945      Impression and Plan: Shawn Bauer is a very pleasant 89 yo caucasian gentleman with IgG kappa myeloma.    Weight is stable but nutritional labs show some concern for inadequate nutritional status. I have suggested  1 Boost or Ensure per day along with his current oral intake. If weight trends down I would suggest a nutrition consult.  Protein studies are pending but have been stable to improved CBC and CMP reviewed and acceptable for treatment  Lab and treatment every 2 weeks, follow-up in 4 weeks with MD with labs.    Rushie Chestnut, PA-C 8/20/202410:30 AM

## 2023-05-14 NOTE — Addendum Note (Signed)
Addended by: Corky Sing on: 05/14/2023 11:17 AM   Modules accepted: Orders

## 2023-05-15 ENCOUNTER — Other Ambulatory Visit: Payer: Self-pay

## 2023-05-15 LAB — IGG, IGA, IGM
IgA: 93 mg/dL (ref 61–437)
IgG (Immunoglobin G), Serum: 632 mg/dL (ref 603–1613)
IgM (Immunoglobulin M), Srm: 32 mg/dL (ref 15–143)

## 2023-05-15 LAB — KAPPA/LAMBDA LIGHT CHAINS
Kappa free light chain: 125.6 mg/L — ABNORMAL HIGH (ref 3.3–19.4)
Kappa, lambda light chain ratio: 14.27 — ABNORMAL HIGH (ref 0.26–1.65)
Lambda free light chains: 8.8 mg/L (ref 5.7–26.3)

## 2023-05-17 LAB — PROTEIN ELECTROPHORESIS, SERUM
A/G Ratio: 1.6 (ref 0.7–1.7)
Albumin ELP: 3.4 g/dL (ref 2.9–4.4)
Alpha-1-Globulin: 0.2 g/dL (ref 0.0–0.4)
Alpha-2-Globulin: 0.7 g/dL (ref 0.4–1.0)
Beta Globulin: 0.7 g/dL (ref 0.7–1.3)
Gamma Globulin: 0.5 g/dL (ref 0.4–1.8)
Globulin, Total: 2.1 g/dL — ABNORMAL LOW (ref 2.2–3.9)
Total Protein ELP: 5.5 g/dL — ABNORMAL LOW (ref 6.0–8.5)

## 2023-05-26 ENCOUNTER — Other Ambulatory Visit: Payer: Self-pay

## 2023-05-28 ENCOUNTER — Inpatient Hospital Stay: Payer: Medicare Other

## 2023-05-28 ENCOUNTER — Inpatient Hospital Stay: Payer: Medicare Other | Attending: Family

## 2023-06-02 ENCOUNTER — Other Ambulatory Visit: Payer: Self-pay | Admitting: Cardiovascular Disease

## 2023-06-05 ENCOUNTER — Other Ambulatory Visit: Payer: Self-pay

## 2023-06-11 ENCOUNTER — Other Ambulatory Visit: Payer: Self-pay

## 2023-06-11 ENCOUNTER — Inpatient Hospital Stay: Payer: Medicare Other

## 2023-06-11 ENCOUNTER — Encounter: Payer: Self-pay | Admitting: Hematology & Oncology

## 2023-06-11 ENCOUNTER — Inpatient Hospital Stay: Payer: Medicare Other | Attending: Family | Admitting: Hematology & Oncology

## 2023-06-11 VITALS — BP 154/66 | HR 68 | Temp 98.2°F | Resp 16 | Ht 70.0 in | Wt 135.0 lb

## 2023-06-11 DIAGNOSIS — C9000 Multiple myeloma not having achieved remission: Secondary | ICD-10-CM

## 2023-06-11 DIAGNOSIS — Z5111 Encounter for antineoplastic chemotherapy: Secondary | ICD-10-CM | POA: Diagnosis present

## 2023-06-11 DIAGNOSIS — D7589 Other specified diseases of blood and blood-forming organs: Secondary | ICD-10-CM

## 2023-06-11 LAB — CBC WITH DIFFERENTIAL (CANCER CENTER ONLY)
Abs Immature Granulocytes: 0.04 10*3/uL (ref 0.00–0.07)
Basophils Absolute: 0.1 10*3/uL (ref 0.0–0.1)
Basophils Relative: 1 %
Eosinophils Absolute: 0.3 10*3/uL (ref 0.0–0.5)
Eosinophils Relative: 3 %
HCT: 35.9 % — ABNORMAL LOW (ref 39.0–52.0)
Hemoglobin: 11.4 g/dL — ABNORMAL LOW (ref 13.0–17.0)
Immature Granulocytes: 1 %
Lymphocytes Relative: 18 %
Lymphs Abs: 1.4 10*3/uL (ref 0.7–4.0)
MCH: 32.5 pg (ref 26.0–34.0)
MCHC: 31.8 g/dL (ref 30.0–36.0)
MCV: 102.3 fL — ABNORMAL HIGH (ref 80.0–100.0)
Monocytes Absolute: 0.9 10*3/uL (ref 0.1–1.0)
Monocytes Relative: 12 %
Neutro Abs: 5 10*3/uL (ref 1.7–7.7)
Neutrophils Relative %: 65 %
Platelet Count: 206 10*3/uL (ref 150–400)
RBC: 3.51 MIL/uL — ABNORMAL LOW (ref 4.22–5.81)
RDW: 14.2 % (ref 11.5–15.5)
WBC Count: 7.6 10*3/uL (ref 4.0–10.5)
nRBC: 0 % (ref 0.0–0.2)

## 2023-06-11 LAB — CMP (CANCER CENTER ONLY)
ALT: 10 U/L (ref 0–44)
AST: 12 U/L — ABNORMAL LOW (ref 15–41)
Albumin: 3.9 g/dL (ref 3.5–5.0)
Alkaline Phosphatase: 54 U/L (ref 38–126)
Anion gap: 7 (ref 5–15)
BUN: 22 mg/dL (ref 8–23)
CO2: 31 mmol/L (ref 22–32)
Calcium: 9.2 mg/dL (ref 8.9–10.3)
Chloride: 101 mmol/L (ref 98–111)
Creatinine: 1.55 mg/dL — ABNORMAL HIGH (ref 0.61–1.24)
GFR, Estimated: 43 mL/min — ABNORMAL LOW (ref >60)
Glucose, Bld: 119 mg/dL — ABNORMAL HIGH (ref 70–99)
Potassium: 4 mmol/L (ref 3.5–5.1)
Sodium: 139 mmol/L (ref 135–145)
Total Bilirubin: 0.7 mg/dL (ref 0.3–1.2)
Total Protein: 6.1 g/dL — ABNORMAL LOW (ref 6.5–8.1)

## 2023-06-11 LAB — VITAMIN B12: Vitamin B-12: 1132 pg/mL — ABNORMAL HIGH (ref 180–914)

## 2023-06-11 LAB — LACTATE DEHYDROGENASE: LDH: 195 U/L — ABNORMAL HIGH (ref 98–192)

## 2023-06-11 LAB — FOLATE: Folate: 19.7 ng/mL (ref >5.9)

## 2023-06-11 MED ORDER — PROCHLORPERAZINE MALEATE 10 MG PO TABS
10.0000 mg | ORAL_TABLET | Freq: Once | ORAL | Status: AC
  Administered 2023-06-11: 10 mg via ORAL
  Filled 2023-06-11: qty 1

## 2023-06-11 MED ORDER — BORTEZOMIB CHEMO SQ INJECTION 3.5 MG (2.5MG/ML)
1.3000 mg/m2 | Freq: Once | INTRAMUSCULAR | Status: AC
  Administered 2023-06-11: 2.25 mg via SUBCUTANEOUS
  Filled 2023-06-11: qty 0.9

## 2023-06-11 MED ORDER — DEXAMETHASONE 4 MG PO TABS
20.0000 mg | ORAL_TABLET | Freq: Once | ORAL | Status: AC
  Administered 2023-06-11: 20 mg via ORAL
  Filled 2023-06-11: qty 5

## 2023-06-11 NOTE — Patient Instructions (Signed)
[facility]  Discharge Instructions: Thank you for choosing [facility] to provide your oncology and hematology care.   If you have a lab appointment with the Cancer Center, please go directly to the Cancer Center and check in at the registration area.  Wear comfortable clothing and clothing appropriate for easy access to any Portacath or PICC line.   We strive to give you quality time with your provider. You may need to reschedule your appointment if you arrive late (15 or more minutes).  Arriving late affects you and other patients whose appointments are after yours.  Also, if you miss three or more appointments without notifying the office, you may be dismissed from the clinic at the provider's discretion.      For prescription refill requests, have your pharmacy contact our office and allow 72 hours for refills to be completed.    Today you received the following chemotherapy and/or immunotherapy agents Velcade.      To help prevent nausea and vomiting after your treatment, we encourage you to take your nausea medication as directed.  BELOW ARE SYMPTOMS THAT SHOULD BE REPORTED IMMEDIATELY: *FEVER GREATER THAN 100.4 F (38 C) OR HIGHER *CHILLS OR SWEATING *NAUSEA AND VOMITING THAT IS NOT CONTROLLED WITH YOUR NAUSEA MEDICATION *UNUSUAL SHORTNESS OF BREATH *UNUSUAL BRUISING OR BLEEDING *URINARY PROBLEMS (pain or burning when urinating, or frequent urination) *BOWEL PROBLEMS (unusual diarrhea, constipation, pain near the anus) TENDERNESS IN MOUTH AND THROAT WITH OR WITHOUT PRESENCE OF ULCERS (sore throat, sores in mouth, or a toothache) UNUSUAL RASH, SWELLING OR PAIN  UNUSUAL VAGINAL DISCHARGE OR ITCHING   Items with * indicate a potential emergency and should be followed up as soon as possible or go to the Emergency Department if any problems should occur.  Please show the CHEMOTHERAPY ALERT CARD or IMMUNOTHERAPY ALERT CARD at check-in  to the Emergency Department and triage nurse. Should you have questions after your visit or need to cancel or reschedule your appointment, please contact [facility] [facility] [place]  [phone] and follow the prompts.  Office hours are 8:00 a.m. to 4:30 p.m. Monday - Friday. Please note that voicemails left after 4:00 p.m. may not be returned until the following business day.  We are closed weekends and major holidays. You have access to a nurse at all times for urgent questions. Please call the main number to the clinic [phone] and follow the prompts.  For any non-urgent questions, you may also contact your provider using MyChart. We now offer e-Visits for anyone 43 and older to request care online for non-urgent symptoms. For details visit [email or web address].   Also download the MyChart app! Go to the app store, search "MyChart", open the app, select [place], and log in with your MyChart username and password.

## 2023-06-11 NOTE — Progress Notes (Signed)
Hematology and Oncology Follow Up Visit  Shawn Bauer [facility] [id number] 1934/07/01 89 y.o. 06/11/2023   Principle Diagnosis:  IgG Kappa myeloma -- high risk due to t(4:21)   Current Therapy:        Velcade/Decadron -- started 10/09/2019, s/p cycle 34 - treatment frequency changed to every other week on 01/21/2021 now s/p cycle 20 Xgeva 120 mg IM q 3 months -- next dose on 06/2023      Interim History:  Shawn Bauer is here today for follow-up and treatment.  He is doing okay.  Unfortunate, he had to let go of one of his dogs yesterday.  She was 33 years old.  He had another dog that had to let go back in January.  She was 87 years old.  He was sick a few weeks ago.  He tested negative for COVID.  He had this respiratory syndrome.  It is hard to say if this was a virus.  Again, he got over it.  He has had no problems with diarrhea.  There is been no change in bowel or bladder habits.  He has had no rashes.  He has had no leg swelling.  When we last saw him, there was no monoclonal spike in his blood.  We really are following the Kappa light chain.  His last Kappa light chain was 12.2 mg/dL.  His appetite is doing okay.  There has been no nausea or vomiting.  He has had no bony pain.  Overall, I would say that his performance status is probably ECOG 1.     Wt Readings from Last 3 Encounters:  06/11/23 135 lb (61.2 kg)  05/14/23 139 lb 1.9 oz (63.1 kg)  04/16/23 138 lb (62.6 kg)      Medications:  Allergies as of 06/11/2023   No Known Allergies      Medication List        Accurate as of June 11, 2023 10:22 AM. If you have any questions, ask your nurse or doctor.          amiodarone 200 MG tablet Commonly known as: PACERONE TAKE 1 TABLET (200 MG TOTAL) BY MOUTH AS DIRECTED. TAKE 200MG  DAILY MONDAY - FRIDAY ONLY.   aspirin EC 81 MG tablet Take 81 mg by mouth daily. Swallow whole.   atorvastatin 40 MG tablet Commonly known as: LIPITOR TAKE 1 TABLET BY MOUTH EVERY  DAY   Breo Ellipta 200-25 MCG/ACT Aepb Generic drug: fluticasone furoate-vilanterol 1 puff daily.   CINNAMON PO Take 1,000 mg by mouth daily.   clopidogrel 75 MG tablet Commonly known as: PLAVIX TAKE 1 TABLET BY MOUTH EVERY DAY WITH BREAKFAST   cyanocobalamin 1000 MCG tablet Take 1,000 mcg by mouth daily.   metoprolol succinate 50 MG 24 hr tablet Commonly known as: TOPROL-XL Take 25 mg by mouth.   nitroGLYCERIN 0.4 MG SL tablet Commonly known as: NITROSTAT Place 1 tablet (0.4 mg total) under the tongue every 5 (five) minutes as needed for chest pain.   olmesartan 40 MG tablet Commonly known as: BENICAR Take 40 mg by mouth in the morning.   ondansetron 8 MG tablet Commonly known as: ZOFRAN TAKE 1 TABLET BY MOUTH EVERY 8 HOURS AS NEEDED FOR NAUSEA OR VOMITING.   polyethylene glycol 17 g packet Commonly known as: MIRALAX / GLYCOLAX Take 17 g by mouth daily.   traMADol 50 MG tablet Commonly known as: ULTRAM Take 50 mg by mouth 2 (two) times daily as needed (for pain).  traZODone 50 MG tablet Commonly known as: DESYREL Take 25-50 mg by mouth at bedtime.        Allergies: No Known Allergies  Past Medical History, Surgical history, Social history, and Family History were reviewed and updated.  Review of Systems:  Review of Systems  Constitutional: Negative.   HENT: Negative.    Eyes: Negative.   Respiratory: Negative.    Cardiovascular: Negative.   Gastrointestinal: Negative.   Genitourinary: Negative.   Musculoskeletal: Negative.   Skin: Negative.   Neurological: Negative.   Endo/Heme/Allergies: Negative.   Psychiatric/Behavioral: Negative.       Physical Exam:  height is 5\' 10"  (1.778 m) and weight is 135 lb (61.2 kg). His oral temperature is 98.2 F (36.8 C). His blood pressure is 154/66 (abnormal) and his pulse is 68. His respiration is 16 and oxygen saturation is 95%.   Wt Readings from Last 3 Encounters:  06/11/23 135 lb (61.2 kg)  05/14/23  139 lb 1.9 oz (63.1 kg)  04/16/23 138 lb (62.6 kg)   Physical Exam Vitals reviewed.  HENT:     Head: Normocephalic and atraumatic.  Eyes:     Pupils: Pupils are equal, round, and reactive to light.  Cardiovascular:     Rate and Rhythm: Normal rate and regular rhythm.     Heart sounds: Normal heart sounds.  Pulmonary:     Effort: Pulmonary effort is normal.     Breath sounds: Normal breath sounds.  Abdominal:     General: Bowel sounds are normal.     Palpations: Abdomen is soft.  Musculoskeletal:        General: No tenderness or deformity. Normal range of motion.     Cervical back: Normal range of motion.  Lymphadenopathy:     Cervical: No cervical adenopathy.  Skin:    General: Skin is warm and dry.     Findings: No erythema or rash.  Neurological:     Mental Status: He is alert and oriented to person, place, and time.  Psychiatric:        Behavior: Behavior normal.        Thought Content: Thought content normal.        Judgment: Judgment normal.      Lab Results  Component Value Date   WBC 7.6 06/11/2023   HGB 11.4 (L) 06/11/2023   HCT 35.9 (L) 06/11/2023   MCV 102.3 (H) 06/11/2023   PLT 206 06/11/2023   Lab Results  Component Value Date   FERRITIN 11 (L) 04/12/2022   IRON 36 (L) 04/12/2022   TIBC 330 04/12/2022   UIBC 294 04/12/2022   IRONPCTSAT 11 (L) 04/12/2022   Lab Results  Component Value Date   RETICCTPCT 1.9 04/12/2022   RBC 3.51 (L) 06/11/2023   Lab Results  Component Value Date   KPAFRELGTCHN 125.6 (H) 05/14/2023   LAMBDASER 8.8 05/14/2023   KAPLAMBRATIO 14.27 (H) 05/14/2023   Lab Results  Component Value Date   IGGSERUM 632 05/14/2023   IGA 93 05/14/2023   IGMSERUM 32 05/14/2023   Lab Results  Component Value Date   TOTALPROTELP 5.5 (L) 05/14/2023   ALBUMINELP 3.4 05/14/2023   A1GS 0.2 05/14/2023   A2GS 0.7 05/14/2023   BETS 0.7 05/14/2023   GAMS 0.5 05/14/2023   MSPIKE Not Observed 05/14/2023   SPEI Comment 05/14/2023      Chemistry      Component Value Date/Time   NA 139 06/11/2023 0911   K 4.0 06/11/2023 0911  CL 101 06/11/2023 0911   CO2 31 06/11/2023 0911   BUN 22 06/11/2023 0911   CREATININE 1.55 (H) 06/11/2023 0911      Component Value Date/Time   CALCIUM 9.2 06/11/2023 0911   ALKPHOS 54 06/11/2023 0911   AST 12 (L) 06/11/2023 0911   ALT 10 06/11/2023 0911   BILITOT 0.7 06/11/2023 0911      Impression and Plan: Shawn Bauer is a very pleasant 89 yo caucasian gentleman with IgG kappa myeloma.    We does have him on single agent Velcade.  I think he is doing pretty well on this.  We will follow his light chain.  Really, the light chain is the indicator for response.  Maybe, if he continues to do well, we might be able to move his treatments out every 3 weeks.  Again, this is all about quality of life.  I still feel bad for his wife and the fact that he lost his dogs this year.  Will come in for treatment only in 2 weeks.  I will see him back in 1 month.  Josph Macho, MD 9/17/202410:22 AM

## 2023-06-11 NOTE — Progress Notes (Signed)
Ok to treat with creat 1.55 per order of Dr. Myna Hidalgo.

## 2023-06-13 ENCOUNTER — Other Ambulatory Visit: Payer: Self-pay

## 2023-06-14 LAB — PROTEIN ELECTROPHORESIS, SERUM
A/G Ratio: 1.3 (ref 0.7–1.7)
Albumin ELP: 3.3 g/dL (ref 2.9–4.4)
Alpha-1-Globulin: 0.3 g/dL (ref 0.0–0.4)
Alpha-2-Globulin: 0.8 g/dL (ref 0.4–1.0)
Beta Globulin: 0.8 g/dL (ref 0.7–1.3)
Gamma Globulin: 0.7 g/dL (ref 0.4–1.8)
Globulin, Total: 2.5 g/dL (ref 2.2–3.9)
Total Protein ELP: 5.8 g/dL — ABNORMAL LOW (ref 6.0–8.5)

## 2023-06-25 ENCOUNTER — Inpatient Hospital Stay: Payer: Medicare Other

## 2023-06-25 ENCOUNTER — Inpatient Hospital Stay: Payer: Medicare Other | Attending: Family

## 2023-06-25 ENCOUNTER — Inpatient Hospital Stay: Payer: Medicare Other | Admitting: Hematology & Oncology

## 2023-06-25 VITALS — BP 142/55 | HR 69 | Temp 97.7°F | Resp 18 | Ht 70.0 in | Wt 136.0 lb

## 2023-06-25 DIAGNOSIS — Z5111 Encounter for antineoplastic chemotherapy: Secondary | ICD-10-CM | POA: Insufficient documentation

## 2023-06-25 DIAGNOSIS — C9000 Multiple myeloma not having achieved remission: Secondary | ICD-10-CM

## 2023-06-25 LAB — CBC WITH DIFFERENTIAL (CANCER CENTER ONLY)
Abs Immature Granulocytes: 0.05 10*3/uL (ref 0.00–0.07)
Basophils Absolute: 0.1 10*3/uL (ref 0.0–0.1)
Basophils Relative: 1 %
Eosinophils Absolute: 0.3 10*3/uL (ref 0.0–0.5)
Eosinophils Relative: 4 %
HCT: 35.7 % — ABNORMAL LOW (ref 39.0–52.0)
Hemoglobin: 11.5 g/dL — ABNORMAL LOW (ref 13.0–17.0)
Immature Granulocytes: 1 %
Lymphocytes Relative: 21 %
Lymphs Abs: 1.9 10*3/uL (ref 0.7–4.0)
MCH: 33.2 pg (ref 26.0–34.0)
MCHC: 32.2 g/dL (ref 30.0–36.0)
MCV: 103.2 fL — ABNORMAL HIGH (ref 80.0–100.0)
Monocytes Absolute: 0.9 10*3/uL (ref 0.1–1.0)
Monocytes Relative: 11 %
Neutro Abs: 5.6 10*3/uL (ref 1.7–7.7)
Neutrophils Relative %: 62 %
Platelet Count: 188 10*3/uL (ref 150–400)
RBC: 3.46 MIL/uL — ABNORMAL LOW (ref 4.22–5.81)
RDW: 14.2 % (ref 11.5–15.5)
WBC Count: 8.8 10*3/uL (ref 4.0–10.5)
nRBC: 0 % (ref 0.0–0.2)

## 2023-06-25 LAB — CMP (CANCER CENTER ONLY)
ALT: 10 U/L (ref 0–44)
AST: 13 U/L — ABNORMAL LOW (ref 15–41)
Albumin: 3.6 g/dL (ref 3.5–5.0)
Alkaline Phosphatase: 54 U/L (ref 38–126)
Anion gap: 6 (ref 5–15)
BUN: 28 mg/dL — ABNORMAL HIGH (ref 8–23)
CO2: 31 mmol/L (ref 22–32)
Calcium: 8.9 mg/dL (ref 8.9–10.3)
Chloride: 103 mmol/L (ref 98–111)
Creatinine: 1.9 mg/dL — ABNORMAL HIGH (ref 0.61–1.24)
GFR, Estimated: 34 mL/min — ABNORMAL LOW (ref >60)
Glucose, Bld: 125 mg/dL — ABNORMAL HIGH (ref 70–99)
Potassium: 4.5 mmol/L (ref 3.5–5.1)
Sodium: 140 mmol/L (ref 135–145)
Total Bilirubin: 0.6 mg/dL (ref 0.3–1.2)
Total Protein: 6 g/dL — ABNORMAL LOW (ref 6.5–8.1)

## 2023-06-25 MED ORDER — BORTEZOMIB CHEMO SQ INJECTION 3.5 MG (2.5MG/ML)
1.3000 mg/m2 | Freq: Once | INTRAMUSCULAR | Status: AC
  Administered 2023-06-25: 2.25 mg via SUBCUTANEOUS
  Filled 2023-06-25: qty 0.9

## 2023-06-25 MED ORDER — PROCHLORPERAZINE MALEATE 10 MG PO TABS
10.0000 mg | ORAL_TABLET | Freq: Once | ORAL | Status: AC
  Administered 2023-06-25: 10 mg via ORAL
  Filled 2023-06-25: qty 1

## 2023-06-25 MED ORDER — DEXAMETHASONE 4 MG PO TABS
20.0000 mg | ORAL_TABLET | Freq: Once | ORAL | Status: AC
  Administered 2023-06-25: 20 mg via ORAL
  Filled 2023-06-25: qty 5

## 2023-06-25 NOTE — Patient Instructions (Signed)
[facility]  Discharge Instructions: Thank you for choosing [facility] to provide your oncology and hematology care.   If you have a lab appointment with the Cancer Center, please go directly to the Cancer Center and check in at the registration area.  Wear comfortable clothing and clothing appropriate for easy access to any Portacath or PICC line.   We strive to give you quality time with your provider. You may need to reschedule your appointment if you arrive late (15 or more minutes).  Arriving late affects you and other patients whose appointments are after yours.  Also, if you miss three or more appointments without notifying the office, you may be dismissed from the clinic at the provider's discretion.      For prescription refill requests, have your pharmacy contact our office and allow 72 hours for refills to be completed.    Today you received the following chemotherapy and/or immunotherapy agents Velcade.      To help prevent nausea and vomiting after your treatment, we encourage you to take your nausea medication as directed.  BELOW ARE SYMPTOMS THAT SHOULD BE REPORTED IMMEDIATELY: *FEVER GREATER THAN 100.4 F (38 C) OR HIGHER *CHILLS OR SWEATING *NAUSEA AND VOMITING THAT IS NOT CONTROLLED WITH YOUR NAUSEA MEDICATION *UNUSUAL SHORTNESS OF BREATH *UNUSUAL BRUISING OR BLEEDING *URINARY PROBLEMS (pain or burning when urinating, or frequent urination) *BOWEL PROBLEMS (unusual diarrhea, constipation, pain near the anus) TENDERNESS IN MOUTH AND THROAT WITH OR WITHOUT PRESENCE OF ULCERS (sore throat, sores in mouth, or a toothache) UNUSUAL RASH, SWELLING OR PAIN  UNUSUAL VAGINAL DISCHARGE OR ITCHING   Items with * indicate a potential emergency and should be followed up as soon as possible or go to the Emergency Department if any problems should occur.  Please show the CHEMOTHERAPY ALERT CARD or IMMUNOTHERAPY ALERT CARD at check-in  to the Emergency Department and triage nurse. Should you have questions after your visit or need to cancel or reschedule your appointment, please contact [facility]  [phone] and follow the prompts.  Office hours are 8:00 a.m. to 4:30 p.m. Monday - Friday. Please note that voicemails left after 4:00 p.m. may not be returned until the following business day.  We are closed weekends and major holidays. You have access to a nurse at all times for urgent questions. Please call the main number to the clinic [phone] and follow the prompts.  For any non-urgent questions, you may also contact your provider using MyChart. We now offer e-Visits for anyone 18 and older to request care online for non-urgent symptoms. For details visit [email or web address].   Also download the MyChart app! Go to the app store, search "MyChart", open the app, select [place], and log in with your MyChart username and password.   

## 2023-06-25 NOTE — Progress Notes (Signed)
Ok to treat with Creatinine of 1.9 per dr Myna Hidalgo

## 2023-07-09 ENCOUNTER — Inpatient Hospital Stay: Payer: Medicare Other

## 2023-07-09 VITALS — BP 144/54 | HR 66 | Temp 98.2°F | Resp 20

## 2023-07-09 DIAGNOSIS — C9000 Multiple myeloma not having achieved remission: Secondary | ICD-10-CM

## 2023-07-09 DIAGNOSIS — N183 Chronic kidney disease, stage 3 unspecified: Secondary | ICD-10-CM

## 2023-07-09 DIAGNOSIS — Z5111 Encounter for antineoplastic chemotherapy: Secondary | ICD-10-CM | POA: Diagnosis not present

## 2023-07-09 LAB — CBC WITH DIFFERENTIAL (CANCER CENTER ONLY)
Abs Immature Granulocytes: 0.04 10*3/uL (ref 0.00–0.07)
Basophils Absolute: 0.1 10*3/uL (ref 0.0–0.1)
Basophils Relative: 1 %
Eosinophils Absolute: 0.4 10*3/uL (ref 0.0–0.5)
Eosinophils Relative: 4 %
HCT: 34.4 % — ABNORMAL LOW (ref 39.0–52.0)
Hemoglobin: 10.9 g/dL — ABNORMAL LOW (ref 13.0–17.0)
Immature Granulocytes: 0 %
Lymphocytes Relative: 15 %
Lymphs Abs: 1.3 10*3/uL (ref 0.7–4.0)
MCH: 32.4 pg (ref 26.0–34.0)
MCHC: 31.7 g/dL (ref 30.0–36.0)
MCV: 102.4 fL — ABNORMAL HIGH (ref 80.0–100.0)
Monocytes Absolute: 0.9 10*3/uL (ref 0.1–1.0)
Monocytes Relative: 10 %
Neutro Abs: 6.3 10*3/uL (ref 1.7–7.7)
Neutrophils Relative %: 70 %
Platelet Count: 168 10*3/uL (ref 150–400)
RBC: 3.36 MIL/uL — ABNORMAL LOW (ref 4.22–5.81)
RDW: 14.1 % (ref 11.5–15.5)
WBC Count: 9.1 10*3/uL (ref 4.0–10.5)
nRBC: 0 % (ref 0.0–0.2)

## 2023-07-09 LAB — CMP (CANCER CENTER ONLY)
ALT: 10 U/L (ref 0–44)
AST: 13 U/L — ABNORMAL LOW (ref 15–41)
Albumin: 3.7 g/dL (ref 3.5–5.0)
Alkaline Phosphatase: 59 U/L (ref 38–126)
Anion gap: 6 (ref 5–15)
BUN: 28 mg/dL — ABNORMAL HIGH (ref 8–23)
CO2: 32 mmol/L (ref 22–32)
Calcium: 9 mg/dL (ref 8.9–10.3)
Chloride: 104 mmol/L (ref 98–111)
Creatinine: 1.95 mg/dL — ABNORMAL HIGH (ref 0.61–1.24)
GFR, Estimated: 33 mL/min — ABNORMAL LOW (ref >60)
Glucose, Bld: 134 mg/dL — ABNORMAL HIGH (ref 70–99)
Potassium: 5.4 mmol/L — ABNORMAL HIGH (ref 3.5–5.1)
Sodium: 142 mmol/L (ref 135–145)
Total Bilirubin: 0.7 mg/dL (ref 0.3–1.2)
Total Protein: 5.8 g/dL — ABNORMAL LOW (ref 6.5–8.1)

## 2023-07-09 MED ORDER — DEXAMETHASONE 4 MG PO TABS
20.0000 mg | ORAL_TABLET | Freq: Once | ORAL | Status: AC
  Administered 2023-07-09: 20 mg via ORAL
  Filled 2023-07-09: qty 5

## 2023-07-09 MED ORDER — PROCHLORPERAZINE MALEATE 10 MG PO TABS
10.0000 mg | ORAL_TABLET | Freq: Once | ORAL | Status: AC
  Administered 2023-07-09: 10 mg via ORAL
  Filled 2023-07-09: qty 1

## 2023-07-09 MED ORDER — BORTEZOMIB CHEMO SQ INJECTION 3.5 MG (2.5MG/ML)
1.3000 mg/m2 | Freq: Once | INTRAMUSCULAR | Status: AC
  Administered 2023-07-09: 2.25 mg via SUBCUTANEOUS
  Filled 2023-07-09: qty 0.9

## 2023-07-09 MED ORDER — DENOSUMAB 120 MG/1.7ML ~~LOC~~ SOLN
120.0000 mg | Freq: Once | SUBCUTANEOUS | Status: AC
  Administered 2023-07-09: 120 mg via SUBCUTANEOUS
  Filled 2023-07-09: qty 1.7

## 2023-07-09 NOTE — Patient Instructions (Signed)
Bortezomib Injection What is this medication? BORTEZOMIB (bor TEZ oh mib) treats lymphoma. It may also be used to treat multiple myeloma, a type of bone marrow cancer. It works by blocking a protein that causes cancer cells to grow and multiply. This helps to slow or stop the spread of cancer cells. This medicine may be used for other purposes; ask your health care provider or pharmacist if you have questions. COMMON BRAND NAME(S): Velcade What should I tell my care team before I take this medication? They need to know if you have any of these conditions: Dehydration Diabetes Heart disease Liver disease Tingling of the fingers or toes or other nerve disorder An unusual or allergic reaction to bortezomib, other medications, foods, dyes, or preservatives If you or your partner are pregnant or trying to get pregnant Breastfeeding How should I use this medication? This medication is injected into a vein or under the skin. It is given by your care team in a hospital or clinic setting. Talk to your care team about the use of this medication in children. Special care may be needed. Overdosage: If you think you have taken too much of this medicine contact a poison control center or emergency room at once. NOTE: This medicine is only for you. Do not share this medicine with others. What if I miss a dose? Keep appointments for follow-up doses. It is important not to miss your dose. Call your care team if you are unable to keep an appointment. What may interact with this medication? Ketoconazole Rifampin This list may not describe all possible interactions. Give your health care provider a list of all the medicines, herbs, non-prescription drugs, or dietary supplements you use. Also tell them if you smoke, drink alcohol, or use illegal drugs. Some items may interact with your medicine. What should I watch for while using this medication? Your condition will be monitored carefully while you are  receiving this medication. You may need blood work while taking this medication. This medication may affect your coordination, reaction time, or judgment. Do not drive or operate machinery until you know how this medication affects you. Sit up or stand slowly to reduce the risk of dizzy or fainting spells. Drinking alcohol with this medication can increase the risk of these side effects. This medication may increase your risk of getting an infection. Call your care team for advice if you get a fever, chills, sore throat, or other symptoms of a cold or flu. Do not treat yourself. Try to avoid being around people who are sick. Check with your care team if you have severe diarrhea, nausea, and vomiting, or if you sweat a lot. The loss of too much body fluid may make it dangerous for you to take this medication. Talk to your care team if you may be pregnant. Serious birth defects can occur if you take this medication during pregnancy and for 7 months after the last dose. You will need a negative pregnancy test before starting this medication. Contraception is recommended while taking this medication and for 7 months after the last dose. Your care team can help you find the option that works for you. If your partner can get pregnant, use a condom during sex while taking this medication and for 4 months after the last dose. Do not breastfeed while taking this medication and for 2 months after the last dose. This medication may cause infertility. Talk to your care team if you are concerned about your fertility. What side effects  may I notice from receiving this medication? Side effects that you should report to your care team as soon as possible: Allergic reactions--skin rash, itching, hives, swelling of the face, lips, tongue, or throat Bleeding--bloody or black, tar-like stools, vomiting blood or brown material that looks like coffee grounds, red or dark brown urine, small red or purple spots on skin, unusual  bruising or bleeding Bleeding in the brain--severe headache, stiff neck, confusion, dizziness, change in vision, numbness or weakness of the face, arm, or leg, trouble speaking, trouble walking, vomiting Bowel blockage--stomach cramping, unable to have a bowel movement or pass gas, loss of appetite, vomiting Heart failure--shortness of breath, swelling of the ankles, feet, or hands, sudden weight gain, unusual weakness or fatigue Infection--fever, chills, cough, sore throat, wounds that don't heal, pain or trouble when passing urine, general feeling of discomfort or being unwell Liver injury--right upper belly pain, loss of appetite, nausea, light-colored stool, dark yellow or brown urine, yellowing skin or eyes, unusual weakness or fatigue Low blood pressure--dizziness, feeling faint or lightheaded, blurry vision Lung injury--shortness of breath or trouble breathing, cough, spitting up blood, chest pain, fever Pain, tingling, or numbness in the hands or feet Severe or prolonged diarrhea Stomach pain, bloody diarrhea, pale skin, unusual weakness or fatigue, decrease in the amount of urine, which may be signs of hemolytic uremic syndrome Sudden and severe headache, confusion, change in vision, seizures, which may be signs of posterior reversible encephalopathy syndrome (PRES) TTP--purple spots on the skin or inside the mouth, pale skin, yellowing skin or eyes, unusual weakness or fatigue, fever, fast or irregular heartbeat, confusion, change in vision, trouble speaking, trouble walking Tumor lysis syndrome (TLS)--nausea, vomiting, diarrhea, decrease in the amount of urine, dark urine, unusual weakness or fatigue, confusion, muscle pain or cramps, fast or irregular heartbeat, joint pain Side effects that usually do not require medical attention (report to your care team if they continue or are bothersome): Constipation Diarrhea Fatigue Loss of appetite Nausea This list may not describe all possible  side effects. Call your doctor for medical advice about side effects. You may report side effects to FDA at 1-800-FDA-1088. Where should I keep my medication? This medication is given in a hospital or clinic. It will not be stored at home. NOTE: This sheet is a summary. It may not cover all possible information. If you have questions about this medicine, talk to your doctor, pharmacist, or health care provider.  2024 [employer] Standard (2022-02-13 00:00:00) Denosumab Injection (Oncology) What is this medication? DENOSUMAB (den oh SUE mab) prevents weakened bones caused by cancer. It may also be used to treat noncancerous bone tumors that cannot be removed by surgery. It can also be used to treat high calcium levels in the blood caused by cancer. It works by blocking a protein that causes bones to break down quickly. This slows down the release of calcium from bones, which lowers calcium levels in your blood. It also makes your bones stronger and less likely to break (fracture). This medicine may be used for other purposes; ask your health care provider or pharmacist if you have questions. COMMON BRAND NAME(S): XGEVA What should I tell my care team before I take this medication? They need to know if you have any of these conditions: Dental disease Having surgery or tooth extraction Infection Kidney disease Low levels of calcium or vitamin D in the blood Malnutrition On hemodialysis Skin conditions or sensitivity Thyroid or parathyroid disease An unusual reaction to denosumab, other medications, foods,  dyes, or preservatives Pregnant or trying to get pregnant Breast-feeding How should I use this medication? This medication is for injection under the skin. It is given by your care team in a hospital or clinic setting. A special MedGuide will be given to you before each treatment. Be sure to read this information carefully each time. Talk to your care team about the use of this medication  in children. While it may be prescribed for children as young as 13 years for selected conditions, precautions do apply. Overdosage: If you think you have taken too much of this medicine contact a poison control center or emergency room at once. NOTE: This medicine is only for you. Do not share this medicine with others. What if I miss a dose? Keep appointments for follow-up doses. It is important not to miss your dose. Call your care team if you are unable to keep an appointment. What may interact with this medication? Do not take this medication with any of the following: Other medications containing denosumab This medication may also interact with the following: Medications that lower your chance of fighting infection Steroid medications, such as prednisone or cortisone This list may not describe all possible interactions. Give your health care provider a list of all the medicines, herbs, non-prescription drugs, or dietary supplements you use. Also tell them if you smoke, drink alcohol, or use illegal drugs. Some items may interact with your medicine. What should I watch for while using this medication? Your condition will be monitored carefully while you are receiving this medication. You may need blood work while taking this medication. This medication may increase your risk of getting an infection. Call your care team for advice if you get a fever, chills, sore throat, or other symptoms of a cold or flu. Do not treat yourself. Try to avoid being around people who are sick. You should make sure you get enough calcium and vitamin D while you are taking this medication, unless your care team tells you not to. Discuss the foods you eat and the vitamins you take with your care team. Some people who take this medication have severe bone, joint, or muscle pain. This medication may also increase your risk for jaw problems or a broken thigh bone. Tell your care team right away if you have severe pain in  your jaw, bones, joints, or muscles. Tell your care team if you have any pain that does not go away or that gets worse. Talk to your care team if you may be pregnant. Serious birth defects can occur if you take this medication during pregnancy and for 5 months after the last dose. You will need a negative pregnancy test before starting this medication. Contraception is recommended while taking this medication and for 5 months after the last dose. Your care team can help you find the option that works for you. What side effects may I notice from receiving this medication? Side effects that you should report to your care team as soon as possible: Allergic reactions--skin rash, itching, hives, swelling of the face, lips, tongue, or throat Bone, joint, or muscle pain Low calcium level--muscle pain or cramps, confusion, tingling, or numbness in the hands or feet Osteonecrosis of the jaw--pain, swelling, or redness in the mouth, numbness of the jaw, poor healing after dental work, unusual discharge from the mouth, visible bones in the mouth Side effects that usually do not require medical attention (report to your care team if they continue or are bothersome): Cough  Diarrhea Fatigue Headache Nausea This list may not describe all possible side effects. Call your doctor for medical advice about side effects. You may report side effects to FDA at 1-800-FDA-1088. Where should I keep my medication? This medication is given in a hospital or clinic. It will not be stored at home. NOTE: This sheet is a summary. It may not cover all possible information. If you have questions about this medicine, talk to your doctor, pharmacist, or health care provider.  2024 [employer]/Gold Standard (2022-01-31 00:00:00)

## 2023-07-23 ENCOUNTER — Inpatient Hospital Stay: Payer: Medicare Other | Admitting: Hematology & Oncology

## 2023-07-23 ENCOUNTER — Inpatient Hospital Stay: Payer: Medicare Other

## 2023-07-23 DIAGNOSIS — C9000 Multiple myeloma not having achieved remission: Secondary | ICD-10-CM

## 2023-07-25 ENCOUNTER — Other Ambulatory Visit: Payer: Self-pay

## 2023-07-29 ENCOUNTER — Inpatient Hospital Stay (HOSPITAL_BASED_OUTPATIENT_CLINIC_OR_DEPARTMENT_OTHER): Payer: Medicare Other | Admitting: Hematology & Oncology

## 2023-07-29 ENCOUNTER — Other Ambulatory Visit: Payer: Self-pay

## 2023-07-29 ENCOUNTER — Inpatient Hospital Stay: Payer: Medicare Other | Attending: Family

## 2023-07-29 ENCOUNTER — Inpatient Hospital Stay: Payer: Medicare Other

## 2023-07-29 ENCOUNTER — Encounter: Payer: Self-pay | Admitting: Hematology & Oncology

## 2023-07-29 VITALS — BP 142/60 | HR 77 | Temp 98.2°F | Resp 19 | Ht 70.0 in | Wt 134.0 lb

## 2023-07-29 DIAGNOSIS — Z5111 Encounter for antineoplastic chemotherapy: Secondary | ICD-10-CM | POA: Insufficient documentation

## 2023-07-29 DIAGNOSIS — C9000 Multiple myeloma not having achieved remission: Secondary | ICD-10-CM

## 2023-07-29 LAB — CMP (CANCER CENTER ONLY)
ALT: 10 U/L (ref 0–44)
AST: 14 U/L — ABNORMAL LOW (ref 15–41)
Albumin: 4.1 g/dL (ref 3.5–5.0)
Alkaline Phosphatase: 53 U/L (ref 38–126)
Anion gap: 8 (ref 5–15)
BUN: 31 mg/dL — ABNORMAL HIGH (ref 8–23)
CO2: 30 mmol/L (ref 22–32)
Calcium: 9.4 mg/dL (ref 8.9–10.3)
Chloride: 103 mmol/L (ref 98–111)
Creatinine: 1.79 mg/dL — ABNORMAL HIGH (ref 0.61–1.24)
GFR, Estimated: 36 mL/min — ABNORMAL LOW (ref >60)
Glucose, Bld: 137 mg/dL — ABNORMAL HIGH (ref 70–99)
Potassium: 4.4 mmol/L (ref 3.5–5.1)
Sodium: 141 mmol/L (ref 135–145)
Total Bilirubin: 0.7 mg/dL (ref <1.2)
Total Protein: 6.3 g/dL — ABNORMAL LOW (ref 6.5–8.1)

## 2023-07-29 LAB — CBC WITH DIFFERENTIAL (CANCER CENTER ONLY)
Abs Immature Granulocytes: 0.02 10*3/uL (ref 0.00–0.07)
Basophils Absolute: 0.1 10*3/uL (ref 0.0–0.1)
Basophils Relative: 1 %
Eosinophils Absolute: 0.3 10*3/uL (ref 0.0–0.5)
Eosinophils Relative: 4 %
HCT: 35.6 % — ABNORMAL LOW (ref 39.0–52.0)
Hemoglobin: 11.1 g/dL — ABNORMAL LOW (ref 13.0–17.0)
Immature Granulocytes: 0 %
Lymphocytes Relative: 19 %
Lymphs Abs: 1.4 10*3/uL (ref 0.7–4.0)
MCH: 31.9 pg (ref 26.0–34.0)
MCHC: 31.2 g/dL (ref 30.0–36.0)
MCV: 102.3 fL — ABNORMAL HIGH (ref 80.0–100.0)
Monocytes Absolute: 0.8 10*3/uL (ref 0.1–1.0)
Monocytes Relative: 11 %
Neutro Abs: 4.8 10*3/uL (ref 1.7–7.7)
Neutrophils Relative %: 65 %
Platelet Count: 182 10*3/uL (ref 150–400)
RBC: 3.48 MIL/uL — ABNORMAL LOW (ref 4.22–5.81)
RDW: 13.8 % (ref 11.5–15.5)
WBC Count: 7.4 10*3/uL (ref 4.0–10.5)
nRBC: 0 % (ref 0.0–0.2)

## 2023-07-29 LAB — LACTATE DEHYDROGENASE: LDH: 177 U/L (ref 98–192)

## 2023-07-29 MED ORDER — DEXAMETHASONE 4 MG PO TABS
20.0000 mg | ORAL_TABLET | Freq: Once | ORAL | Status: AC
  Administered 2023-07-29: 20 mg via ORAL
  Filled 2023-07-29: qty 5

## 2023-07-29 MED ORDER — BORTEZOMIB CHEMO SQ INJECTION 3.5 MG (2.5MG/ML)
1.3000 mg/m2 | Freq: Once | INTRAMUSCULAR | Status: AC
  Administered 2023-07-29: 2.25 mg via SUBCUTANEOUS
  Filled 2023-07-29: qty 0.9

## 2023-07-29 MED ORDER — PROCHLORPERAZINE MALEATE 10 MG PO TABS
10.0000 mg | ORAL_TABLET | Freq: Once | ORAL | Status: AC
Start: 2023-07-29 — End: 2023-07-29
  Administered 2023-07-29: 10 mg via ORAL
  Filled 2023-07-29: qty 1

## 2023-07-29 NOTE — Patient Instructions (Signed)
Bortezomib Injection What is this medication? BORTEZOMIB (bor TEZ oh mib) treats lymphoma. It may also be used to treat multiple myeloma, a type of bone marrow cancer. It works by blocking a protein that causes cancer cells to grow and multiply. This helps to slow or stop the spread of cancer cells. This medicine may be used for other purposes; ask your health care provider or pharmacist if you have questions. COMMON BRAND NAME(S): Velcade What should I tell my care team before I take this medication? They need to know if you have any of these conditions: Dehydration Diabetes Heart disease Liver disease Tingling of the fingers or toes or other nerve disorder An unusual or allergic reaction to bortezomib, other medications, foods, dyes, or preservatives If you or your partner are pregnant or trying to get pregnant Breastfeeding How should I use this medication? This medication is injected into a vein or under the skin. It is given by your care team in a hospital or clinic setting. Talk to your care team about the use of this medication in children. Special care may be needed. Overdosage: If you think you have taken too much of this medicine contact a poison control center or emergency room at once. NOTE: This medicine is only for you. Do not share this medicine with others. What if I miss a dose? Keep appointments for follow-up doses. It is important not to miss your dose. Call your care team if you are unable to keep an appointment. What may interact with this medication? Ketoconazole Rifampin This list may not describe all possible interactions. Give your health care provider a list of all the medicines, herbs, non-prescription drugs, or dietary supplements you use. Also tell them if you smoke, drink alcohol, or use illegal drugs. Some items may interact with your medicine. What should I watch for while using this medication? Your condition will be monitored carefully while you are  receiving this medication. You may need blood work while taking this medication. This medication may affect your coordination, reaction time, or judgment. Do not drive or operate machinery until you know how this medication affects you. Sit up or stand slowly to reduce the risk of dizzy or fainting spells. Drinking alcohol with this medication can increase the risk of these side effects. This medication may increase your risk of getting an infection. Call your care team for advice if you get a fever, chills, sore throat, or other symptoms of a cold or flu. Do not treat yourself. Try to avoid being around people who are sick. Check with your care team if you have severe diarrhea, nausea, and vomiting, or if you sweat a lot. The loss of too much body fluid may make it dangerous for you to take this medication. Talk to your care team if you may be pregnant. Serious birth defects can occur if you take this medication during pregnancy and for 7 months after the last dose. You will need a negative pregnancy test before starting this medication. Contraception is recommended while taking this medication and for 7 months after the last dose. Your care team can help you find the option that works for you. If your partner can get pregnant, use a condom during sex while taking this medication and for 4 months after the last dose. Do not breastfeed while taking this medication and for 2 months after the last dose. This medication may cause infertility. Talk to your care team if you are concerned about your fertility. What side effects   may I notice from receiving this medication? Side effects that you should report to your care team as soon as possible: Allergic reactions--skin rash, itching, hives, swelling of the face, lips, tongue, or throat Bleeding--bloody or black, tar-like stools, vomiting blood or brown material that looks like coffee grounds, red or dark brown urine, small red or purple spots on skin, unusual  bruising or bleeding Bleeding in the brain--severe headache, stiff neck, confusion, dizziness, change in vision, numbness or weakness of the face, arm, or leg, trouble speaking, trouble walking, vomiting Bowel blockage--stomach cramping, unable to have a bowel movement or pass gas, loss of appetite, vomiting Heart failure--shortness of breath, swelling of the ankles, feet, or hands, sudden weight gain, unusual weakness or fatigue Infection--fever, chills, cough, sore throat, wounds that don't heal, pain or trouble when passing urine, general feeling of discomfort or being unwell Liver injury--right upper belly pain, loss of appetite, nausea, light-colored stool, dark yellow or brown urine, yellowing skin or eyes, unusual weakness or fatigue Low blood pressure--dizziness, feeling faint or lightheaded, blurry vision Lung injury--shortness of breath or trouble breathing, cough, spitting up blood, chest pain, fever Pain, tingling, or numbness in the hands or feet Severe or prolonged diarrhea Stomach pain, bloody diarrhea, pale skin, unusual weakness or fatigue, decrease in the amount of urine, which may be signs of hemolytic uremic syndrome Sudden and severe headache, confusion, change in vision, seizures, which may be signs of posterior reversible encephalopathy syndrome (PRES) TTP--purple spots on the skin or inside the mouth, pale skin, yellowing skin or eyes, unusual weakness or fatigue, fever, fast or irregular heartbeat, confusion, change in vision, trouble speaking, trouble walking Tumor lysis syndrome (TLS)--nausea, vomiting, diarrhea, decrease in the amount of urine, dark urine, unusual weakness or fatigue, confusion, muscle pain or cramps, fast or irregular heartbeat, joint pain Side effects that usually do not require medical attention (report to your care team if they continue or are bothersome): Constipation Diarrhea Fatigue Loss of appetite Nausea This list may not describe all possible  side effects. Call your doctor for medical advice about side effects. You may report side effects to FDA at 1-800-FDA-1088. Where should I keep my medication? This medication is given in a hospital or clinic. It will not be stored at home. NOTE: This sheet is a summary. It may not cover all possible information. If you have questions about this medicine, talk to your doctor, pharmacist, or health care provider.  2024 [employer]/Gold Standard (2022-02-13 00:00:00)  

## 2023-07-29 NOTE — Progress Notes (Signed)
Treatment 6 weeks Hematology and Oncology Follow Up Visit  Shawn Bauer [facility] [id number] Feb 21, 1934 89 y.o. 07/29/2023   Principle Diagnosis:  IgG Kappa myeloma -- high risk due to t(4:21)   Current Therapy:        Velcade/Decadron -- started 10/09/2019, s/p cycle 34 - treatment frequency changed to every other week on 01/21/2021 now s/p cycle 20 --HOLD TREATMENT STARTING 07/29/2023 Xgeva 120 mg IM q 3 months -- next dose on 09/2023       Interim History:  Shawn Bauer is here today for follow-up and treatment.  I really think that we need to give him a break from treatment.  He is 89 years old now.  He just had a birthday a couple weeks ago.  He has done quite nicely.  I just do not not want to see any problems from treatment Complications from treatment.  When we last saw him back in September, Kappa light chain was 13.5 mg/dL.  He is going send and a 24-hour urine for [place].  He has had no problems with pain.  He has had no problems with nausea or vomiting.  He has had a little bit of diarrhea.  He does take MiraLAX.  He has had no fever.  He has had no leg swelling.Marland Kitchen  He does have a few ecchymoses.  I think this might be from Plavix.  Overall, I would say that his performance status is probably ECOG 2.   Wt Readings from Last 3 Encounters:  07/29/23 134 lb (60.8 kg)  06/25/23 136 lb (61.7 kg)  06/11/23 135 lb (61.2 kg)      Medications:  Allergies as of 07/29/2023   No Known Allergies      Medication List        Accurate as of July 29, 2023 10:51 AM. If you have any questions, ask your nurse or doctor.          amiodarone 200 MG tablet Commonly known as: PACERONE TAKE 1 TABLET (200 MG TOTAL) BY MOUTH AS DIRECTED. TAKE 200MG  DAILY MONDAY - FRIDAY ONLY.   aspirin EC 81 MG tablet Take 81 mg by mouth daily. Swallow whole.   atorvastatin 40 MG tablet Commonly known as: LIPITOR TAKE 1 TABLET BY MOUTH EVERY DAY   Breo Ellipta 200-25 MCG/ACT Aepb Generic drug:  fluticasone furoate-vilanterol 1 puff daily.   CINNAMON PO Take 1,000 mg by mouth daily.   clopidogrel 75 MG tablet Commonly known as: PLAVIX TAKE 1 TABLET BY MOUTH EVERY DAY WITH BREAKFAST   cyanocobalamin 1000 MCG tablet Take 1,000 mcg by mouth daily.   metoprolol succinate 50 MG 24 hr tablet Commonly known as: TOPROL-XL Take 25 mg by mouth.   nitroGLYCERIN 0.4 MG SL tablet Commonly known as: NITROSTAT Place 1 tablet (0.4 mg total) under the tongue every 5 (five) minutes as needed for chest pain.   olmesartan 40 MG tablet Commonly known as: BENICAR Take 40 mg by mouth in the morning.   ondansetron 8 MG tablet Commonly known as: ZOFRAN TAKE 1 TABLET BY MOUTH EVERY 8 HOURS AS NEEDED FOR NAUSEA OR VOMITING.   polyethylene glycol 17 g packet Commonly known as: MIRALAX / GLYCOLAX Take 17 g by mouth daily.   traMADol 50 MG tablet Commonly known as: ULTRAM Take 50 mg by mouth 2 (two) times daily as needed (for pain).   traZODone 50 MG tablet Commonly known as: DESYREL Take 25-50 mg by mouth at bedtime.  Allergies: No Known Allergies  Past Medical History, Surgical history, Social history, and Family History were reviewed and updated.  Review of Systems:  Review of Systems  Constitutional: Negative.   HENT: Negative.    Eyes: Negative.   Respiratory: Negative.    Cardiovascular: Negative.   Gastrointestinal: Negative.   Genitourinary: Negative.   Musculoskeletal: Negative.   Skin: Negative.   Neurological: Negative.   Endo/Heme/Allergies: Negative.   Psychiatric/Behavioral: Negative.       Physical Exam:  height is 5\' 10"  (1.778 m) and weight is 134 lb (60.8 kg). His oral temperature is 98.2 F (36.8 C). His blood pressure is 142/60 (abnormal) and his pulse is 77. His respiration is 19 and oxygen saturation is 99%.   Wt Readings from Last 3 Encounters:  07/29/23 134 lb (60.8 kg)  06/25/23 136 lb (61.7 kg)  06/11/23 135 lb (61.2 kg)    Physical Exam Vitals reviewed.  HENT:     Head: Normocephalic and atraumatic.  Eyes:     Pupils: Pupils are equal, round, and reactive to light.  Cardiovascular:     Rate and Rhythm: Normal rate and regular rhythm.     Heart sounds: Normal heart sounds.  Pulmonary:     Effort: Pulmonary effort is normal.     Breath sounds: Normal breath sounds.  Abdominal:     General: Bowel sounds are normal.     Palpations: Abdomen is soft.  Musculoskeletal:        General: No tenderness or deformity. Normal range of motion.     Cervical back: Normal range of motion.  Lymphadenopathy:     Cervical: No cervical adenopathy.  Skin:    General: Skin is warm and dry.     Findings: No erythema or rash.  Neurological:     Mental Status: He is alert and oriented to person, place, and time.  Psychiatric:        Behavior: Behavior normal.        Thought Content: Thought content normal.        Judgment: Judgment normal.      Lab Results  Component Value Date   WBC 7.4 07/29/2023   HGB 11.1 (L) 07/29/2023   HCT 35.6 (L) 07/29/2023   MCV 102.3 (H) 07/29/2023   PLT 182 07/29/2023   Lab Results  Component Value Date   FERRITIN 11 (L) 04/12/2022   IRON 36 (L) 04/12/2022   TIBC 330 04/12/2022   UIBC 294 04/12/2022   IRONPCTSAT 11 (L) 04/12/2022   Lab Results  Component Value Date   RETICCTPCT 1.9 04/12/2022   RBC 3.48 (L) 07/29/2023   Lab Results  Component Value Date   KPAFRELGTCHN 135.4 (H) 06/11/2023   LAMBDASER 11.9 06/11/2023   KAPLAMBRATIO 11.38 (H) 06/11/2023   Lab Results  Component Value Date   IGGSERUM 752 06/11/2023   IGA 124 06/11/2023   IGMSERUM 52 06/11/2023   Lab Results  Component Value Date   TOTALPROTELP 5.8 (L) 06/11/2023   ALBUMINELP 3.3 06/11/2023   A1GS 0.3 06/11/2023   A2GS 0.8 06/11/2023   BETS 0.8 06/11/2023   GAMS 0.7 06/11/2023   MSPIKE Not Observed 06/11/2023   SPEI Comment 06/11/2023     Chemistry      Component Value Date/Time   NA 141  07/29/2023 0922   K 4.4 07/29/2023 0922   CL 103 07/29/2023 0922   CO2 30 07/29/2023 0922   BUN 31 (H) 07/29/2023 0922   CREATININE 1.79 (H) 07/29/2023 4098  Component Value Date/Time   CALCIUM 9.4 07/29/2023 0922   ALKPHOS 53 07/29/2023 0922   AST 14 (L) 07/29/2023 0922   ALT 10 07/29/2023 0922   BILITOT 0.7 07/29/2023 3557      Impression and Plan: Shawn Bauer is a very pleasant 89 yo caucasian gentleman with IgG kappa myeloma.    I will go ahead and treat him today.  After this, we will give him a break from treatment.  I will give him a "vacation" which I think would help him out quite a bit.  For right now, I would like to see him back after Thanksgiving.  Hopefully, we will see that his monoclonal studies stabilize a little bit.  If we try to get him a few months off of therapy, I really think this would help him out.   Josph Macho, MD 11/4/202410:51 AM

## 2023-07-29 NOTE — Progress Notes (Signed)
MD reviewed CBC and CMEt, "ok to treat despite counts."

## 2023-07-30 ENCOUNTER — Other Ambulatory Visit: Payer: Self-pay

## 2023-07-30 LAB — IGG, IGA, IGM
IgA: 120 mg/dL (ref 61–437)
IgG (Immunoglobin G), Serum: 702 mg/dL (ref 603–1613)
IgM (Immunoglobulin M), Srm: 47 mg/dL (ref 15–143)

## 2023-07-30 LAB — KAPPA/LAMBDA LIGHT CHAINS
Kappa free light chain: 115.3 mg/L — ABNORMAL HIGH (ref 3.3–19.4)
Kappa, lambda light chain ratio: 12.14 — ABNORMAL HIGH (ref 0.26–1.65)
Lambda free light chains: 9.5 mg/L (ref 5.7–26.3)

## 2023-07-31 LAB — PROTEIN ELECTROPHORESIS, SERUM, WITH REFLEX
A/G Ratio: 1.6 (ref 0.7–1.7)
Albumin ELP: 3.6 g/dL (ref 2.9–4.4)
Alpha-1-Globulin: 0.2 g/dL (ref 0.0–0.4)
Alpha-2-Globulin: 0.7 g/dL (ref 0.4–1.0)
Beta Globulin: 0.7 g/dL (ref 0.7–1.3)
Gamma Globulin: 0.6 g/dL (ref 0.4–1.8)
Globulin, Total: 2.2 g/dL (ref 2.2–3.9)
Total Protein ELP: 5.8 g/dL — ABNORMAL LOW (ref 6.0–8.5)

## 2023-09-05 ENCOUNTER — Encounter: Payer: Self-pay | Admitting: Hematology & Oncology

## 2023-09-05 ENCOUNTER — Inpatient Hospital Stay: Payer: Medicare Other | Attending: Family

## 2023-09-05 ENCOUNTER — Inpatient Hospital Stay (HOSPITAL_BASED_OUTPATIENT_CLINIC_OR_DEPARTMENT_OTHER): Payer: Medicare Other | Admitting: Hematology & Oncology

## 2023-09-05 ENCOUNTER — Inpatient Hospital Stay: Payer: Medicare Other

## 2023-09-05 VITALS — BP 129/55 | HR 63 | Temp 98.6°F | Resp 20 | Ht 70.0 in | Wt 132.8 lb

## 2023-09-05 DIAGNOSIS — R110 Nausea: Secondary | ICD-10-CM | POA: Diagnosis not present

## 2023-09-05 DIAGNOSIS — C9000 Multiple myeloma not having achieved remission: Secondary | ICD-10-CM | POA: Insufficient documentation

## 2023-09-05 LAB — CMP (CANCER CENTER ONLY)
ALT: 10 U/L (ref 0–44)
AST: 14 U/L — ABNORMAL LOW (ref 15–41)
Albumin: 3.7 g/dL (ref 3.5–5.0)
Alkaline Phosphatase: 46 U/L (ref 38–126)
Anion gap: 6 (ref 5–15)
BUN: 23 mg/dL (ref 8–23)
CO2: 30 mmol/L (ref 22–32)
Calcium: 9 mg/dL (ref 8.9–10.3)
Chloride: 103 mmol/L (ref 98–111)
Creatinine: 1.69 mg/dL — ABNORMAL HIGH (ref 0.61–1.24)
GFR, Estimated: 39 mL/min — ABNORMAL LOW (ref >60)
Glucose, Bld: 132 mg/dL — ABNORMAL HIGH (ref 70–99)
Potassium: 3.8 mmol/L (ref 3.5–5.1)
Sodium: 139 mmol/L (ref 135–145)
Total Bilirubin: 0.7 mg/dL (ref <1.2)
Total Protein: 6.1 g/dL — ABNORMAL LOW (ref 6.5–8.1)

## 2023-09-05 LAB — CBC WITH DIFFERENTIAL (CANCER CENTER ONLY)
Abs Immature Granulocytes: 0.05 10*3/uL (ref 0.00–0.07)
Basophils Absolute: 0.1 10*3/uL (ref 0.0–0.1)
Basophils Relative: 1 %
Eosinophils Absolute: 0.4 10*3/uL (ref 0.0–0.5)
Eosinophils Relative: 4 %
HCT: 35.6 % — ABNORMAL LOW (ref 39.0–52.0)
Hemoglobin: 11.4 g/dL — ABNORMAL LOW (ref 13.0–17.0)
Immature Granulocytes: 1 %
Lymphocytes Relative: 18 %
Lymphs Abs: 1.8 10*3/uL (ref 0.7–4.0)
MCH: 32.7 pg (ref 26.0–34.0)
MCHC: 32 g/dL (ref 30.0–36.0)
MCV: 102 fL — ABNORMAL HIGH (ref 80.0–100.0)
Monocytes Absolute: 1.1 10*3/uL — ABNORMAL HIGH (ref 0.1–1.0)
Monocytes Relative: 11 %
Neutro Abs: 6.4 10*3/uL (ref 1.7–7.7)
Neutrophils Relative %: 65 %
Platelet Count: 182 10*3/uL (ref 150–400)
RBC: 3.49 MIL/uL — ABNORMAL LOW (ref 4.22–5.81)
RDW: 14.1 % (ref 11.5–15.5)
WBC Count: 9.8 10*3/uL (ref 4.0–10.5)
nRBC: 0 % (ref 0.0–0.2)

## 2023-09-05 LAB — LACTATE DEHYDROGENASE: LDH: 181 U/L (ref 98–192)

## 2023-09-05 MED ORDER — PROMETHAZINE HCL 12.5 MG PO TABS: 12.5000 mg | ORAL_TABLET | Freq: Three times a day (TID) | ORAL | 4 refills | Status: DC | PRN

## 2023-09-05 NOTE — Progress Notes (Signed)
Treatment 6 weeks Hematology and Oncology Follow Up Visit  Shawn Bauer [facility] [id number] 11/27/1933 89 y.o. 09/05/2023   Principle Diagnosis:  IgG Kappa myeloma -- high risk due to t(4:21)   Current Therapy:        Velcade/Decadron -- started 10/09/2019, s/p cycle 34 - treatment frequency changed to every other week on 01/21/2021 now s/p cycle 20 --HOLD TREATMENT STARTING 07/29/2023 Xgeva 120 mg IM q 3 months -- next dose on 09/2023       Interim History:  Shawn Bauer is here today for follow-up.  We have him off treatment now.  Has been off treatment now for about 5 weeks.  He is slowly getting better.  He still is having a bit difficulty ambulating.  I am unsure if he has little bit of neuropathy.  He has had no problems falling..  Continues on his anticoagulation.  He is on Plavix.    As far as his myeloma is concerned, last time that we checked the Kappa light chain it was 11.5 mg/dL.  His appetite is doing okay.  He had a very nice Thanksgiving.  He has had no problems with bowels or bladder.  He does use MiraLAX.    There is been no bleeding.  He has had no rashes.  He has had no fever.  There is been no cough or shortness of breath.  Overall, I would say that his performance status is probably ECOG 2.   Wt Readings from Last 3 Encounters:  09/05/23 132 lb 12.8 oz (60.2 kg)  07/29/23 134 lb (60.8 kg)  06/25/23 136 lb (61.7 kg)      Medications:  Allergies as of 09/05/2023   No Known Allergies      Medication List        Accurate as of September 05, 2023 10:34 AM. If you have any questions, ask your nurse or doctor.          amiodarone 200 MG tablet Commonly known as: PACERONE TAKE 1 TABLET (200 MG TOTAL) BY MOUTH AS DIRECTED. TAKE 200MG  DAILY MONDAY - FRIDAY ONLY.   aspirin EC 81 MG tablet Take 81 mg by mouth daily. Swallow whole.   atorvastatin 40 MG tablet Commonly known as: LIPITOR TAKE 1 TABLET BY MOUTH EVERY DAY   Breo Ellipta 200-25 MCG/ACT  Aepb Generic drug: fluticasone furoate-vilanterol 1 puff daily.   CINNAMON PO Take 1,000 mg by mouth daily.   clopidogrel 75 MG tablet Commonly known as: PLAVIX TAKE 1 TABLET BY MOUTH EVERY DAY WITH BREAKFAST   cyanocobalamin 1000 MCG tablet Take 1,000 mcg by mouth daily.   metoprolol succinate 50 MG 24 hr tablet Commonly known as: TOPROL-XL Take 25 mg by mouth daily.   nitroGLYCERIN 0.4 MG SL tablet Commonly known as: NITROSTAT Place 1 tablet (0.4 mg total) under the tongue every 5 (five) minutes as needed for chest pain.   olmesartan 40 MG tablet Commonly known as: BENICAR Take 40 mg by mouth in the morning.   ondansetron 8 MG tablet Commonly known as: ZOFRAN TAKE 1 TABLET BY MOUTH EVERY 8 HOURS AS NEEDED FOR NAUSEA OR VOMITING.   polyethylene glycol 17 g packet Commonly known as: MIRALAX / GLYCOLAX Take 17 g by mouth daily.   traMADol 50 MG tablet Commonly known as: ULTRAM Take 50 mg by mouth 2 (two) times daily as needed (for pain).   traZODone 50 MG tablet Commonly known as: DESYREL Take 25-50 mg by mouth at bedtime.  Allergies: No Known Allergies  Past Medical History, Surgical history, Social history, and Family History were reviewed and updated.  Review of Systems:  Review of Systems  Constitutional: Negative.   HENT: Negative.    Eyes: Negative.   Respiratory: Negative.    Cardiovascular: Negative.   Gastrointestinal: Negative.   Genitourinary: Negative.   Musculoskeletal: Negative.   Skin: Negative.   Neurological: Negative.   Endo/Heme/Allergies: Negative.   Psychiatric/Behavioral: Negative.       Physical Exam:  height is 5\' 10"  (1.778 m) and weight is 132 lb 12.8 oz (60.2 kg). His oral temperature is 98.6 F (37 C). His blood pressure is 129/55 (abnormal) and his pulse is 63. His respiration is 20 and oxygen saturation is 96%.   Wt Readings from Last 3 Encounters:  09/05/23 132 lb 12.8 oz (60.2 kg)  07/29/23 134 lb (60.8 kg)   06/25/23 136 lb (61.7 kg)   Physical Exam Vitals reviewed.  HENT:     Head: Normocephalic and atraumatic.  Eyes:     Pupils: Pupils are equal, round, and reactive to light.  Cardiovascular:     Rate and Rhythm: Normal rate and regular rhythm.     Heart sounds: Normal heart sounds.  Pulmonary:     Effort: Pulmonary effort is normal.     Breath sounds: Normal breath sounds.  Abdominal:     General: Bowel sounds are normal.     Palpations: Abdomen is soft.  Musculoskeletal:        General: No tenderness or deformity. Normal range of motion.     Cervical back: Normal range of motion.  Lymphadenopathy:     Cervical: No cervical adenopathy.  Skin:    General: Skin is warm and dry.     Findings: No erythema or rash.  Neurological:     Mental Status: He is alert and oriented to person, place, and time.  Psychiatric:        Behavior: Behavior normal.        Thought Content: Thought content normal.        Judgment: Judgment normal.      Lab Results  Component Value Date   WBC 9.8 09/05/2023   HGB 11.4 (L) 09/05/2023   HCT 35.6 (L) 09/05/2023   MCV 102.0 (H) 09/05/2023   PLT 182 09/05/2023   Lab Results  Component Value Date   FERRITIN 11 (L) 04/12/2022   IRON 36 (L) 04/12/2022   TIBC 330 04/12/2022   UIBC 294 04/12/2022   IRONPCTSAT 11 (L) 04/12/2022   Lab Results  Component Value Date   RETICCTPCT 1.9 04/12/2022   RBC 3.49 (L) 09/05/2023   Lab Results  Component Value Date   KPAFRELGTCHN 115.3 (H) 07/29/2023   LAMBDASER 9.5 07/29/2023   KAPLAMBRATIO 12.14 (H) 07/29/2023   Lab Results  Component Value Date   IGGSERUM 702 07/29/2023   IGA 120 07/29/2023   IGMSERUM 47 07/29/2023   Lab Results  Component Value Date   TOTALPROTELP 5.8 (L) 07/29/2023   ALBUMINELP 3.6 07/29/2023   A1GS 0.2 07/29/2023   A2GS 0.7 07/29/2023   BETS 0.7 07/29/2023   GAMS 0.6 07/29/2023   MSPIKE Not Observed 07/29/2023   SPEI Comment 06/11/2023     Chemistry       Component Value Date/Time   NA 139 09/05/2023 0919   K 3.8 09/05/2023 0919   CL 103 09/05/2023 0919   CO2 30 09/05/2023 0919   BUN 23 09/05/2023 0919   CREATININE 1.69 (  H) 09/05/2023 0919      Component Value Date/Time   CALCIUM 9.0 09/05/2023 0919   ALKPHOS 46 09/05/2023 0919   AST 14 (L) 09/05/2023 0919   ALT 10 09/05/2023 0919   BILITOT 0.7 09/05/2023 0919      Impression and Plan: Mr. Beiler is a very pleasant 89 yo caucasian gentleman with IgG kappa myeloma.    For right now, we will continue him off treatment.  I think he really needs to be off treatment.  Again, we are dealing with quality of life issues.  I really do not think that his treatment was helping his quality of life.  I got to mention that he says he is has nausea every day.  He says Zofran is not working.  Maybe, I will try him on little Phenergan.  I would like to get him back in 6 weeks now.  Will get him through the Holiday season and back in January.     Josph Macho, MD 12/12/202410:34 AM

## 2023-09-06 LAB — KAPPA/LAMBDA LIGHT CHAINS
Kappa free light chain: 132.9 mg/L — ABNORMAL HIGH (ref 3.3–19.4)
Kappa, lambda light chain ratio: 14.6 — ABNORMAL HIGH (ref 0.26–1.65)
Lambda free light chains: 9.1 mg/L (ref 5.7–26.3)

## 2023-09-07 ENCOUNTER — Other Ambulatory Visit: Payer: Self-pay

## 2023-09-07 LAB — IGG, IGA, IGM
IgA: 117 mg/dL (ref 61–437)
IgG (Immunoglobin G), Serum: 721 mg/dL (ref 603–1613)
IgM (Immunoglobulin M), Srm: 42 mg/dL (ref 15–143)

## 2023-09-09 LAB — PROTEIN ELECTROPHORESIS, SERUM, WITH REFLEX
A/G Ratio: 1.6 (ref 0.7–1.7)
Albumin ELP: 3.6 g/dL (ref 2.9–4.4)
Alpha-1-Globulin: 0.2 g/dL (ref 0.0–0.4)
Alpha-2-Globulin: 0.6 g/dL (ref 0.4–1.0)
Beta Globulin: 0.7 g/dL (ref 0.7–1.3)
Gamma Globulin: 0.6 g/dL (ref 0.4–1.8)
Globulin, Total: 2.2 g/dL (ref 2.2–3.9)
Total Protein ELP: 5.8 g/dL — ABNORMAL LOW (ref 6.0–8.5)

## 2023-09-10 ENCOUNTER — Other Ambulatory Visit: Payer: Self-pay

## 2023-09-10 LAB — UPEP/UIFE/LIGHT CHAINS/TP, 24-HR UR
% BETA, Urine: 35.2 %
ALPHA 1 URINE: 6.1 %
Albumin, U: 17.7 %
Alpha 2, Urine: 17 %
Free Kappa Lt Chains,Ur: 362.19 mg/L — ABNORMAL HIGH (ref 1.17–86.46)
Free Kappa/Lambda Ratio: 27.67 — ABNORMAL HIGH (ref 1.83–14.26)
Free Lambda Lt Chains,Ur: 13.09 mg/L (ref 0.27–15.21)
GAMMA GLOBULIN URINE: 24.1 %
M-SPIKE %, Urine: 14.5 % — ABNORMAL HIGH
M-Spike, Mg/24 Hr: 17 mg/(24.h) — ABNORMAL HIGH
Total Protein, Urine-Ur/day: 114 mg/(24.h) (ref 30–150)
Total Protein, Urine: 16.3 mg/dL
Total Volume: 700

## 2023-09-11 ENCOUNTER — Telehealth: Payer: Self-pay | Admitting: *Deleted

## 2023-09-11 NOTE — Telephone Encounter (Signed)
As noted below by Dr. Myna Hidalgo, I informed the patient that the 24 hour urine is stable compared to five months ago. He verbalized understanding.

## 2023-09-11 NOTE — Telephone Encounter (Signed)
-----   Message from Josph Macho sent at 09/10/2023  7:51 PM EST ----- Call - the 24 hr urine protein is pretty stable compared to 5 months ago.  Cindee Lame

## 2023-09-13 ENCOUNTER — Other Ambulatory Visit: Payer: Self-pay

## 2023-10-02 ENCOUNTER — Other Ambulatory Visit: Payer: Self-pay

## 2023-10-17 ENCOUNTER — Inpatient Hospital Stay: Payer: Medicare Other | Attending: Family

## 2023-10-17 ENCOUNTER — Encounter: Payer: Self-pay | Admitting: Medical Oncology

## 2023-10-17 ENCOUNTER — Inpatient Hospital Stay: Payer: Medicare Other

## 2023-10-17 ENCOUNTER — Inpatient Hospital Stay: Payer: Medicare Other | Admitting: Medical Oncology

## 2023-10-17 VITALS — BP 157/68 | HR 68 | Temp 98.3°F | Resp 18 | Ht 70.0 in | Wt 133.0 lb

## 2023-10-17 DIAGNOSIS — C9000 Multiple myeloma not having achieved remission: Secondary | ICD-10-CM

## 2023-10-17 DIAGNOSIS — D7589 Other specified diseases of blood and blood-forming organs: Secondary | ICD-10-CM | POA: Diagnosis not present

## 2023-10-17 DIAGNOSIS — N183 Chronic kidney disease, stage 3 unspecified: Secondary | ICD-10-CM

## 2023-10-17 DIAGNOSIS — D631 Anemia in chronic kidney disease: Secondary | ICD-10-CM | POA: Diagnosis not present

## 2023-10-17 LAB — CMP (CANCER CENTER ONLY)
ALT: 14 U/L (ref 0–44)
AST: 17 U/L (ref 15–41)
Albumin: 3.8 g/dL (ref 3.5–5.0)
Alkaline Phosphatase: 58 U/L (ref 38–126)
Anion gap: 7 (ref 5–15)
BUN: 26 mg/dL — ABNORMAL HIGH (ref 8–23)
CO2: 29 mmol/L (ref 22–32)
Calcium: 9 mg/dL (ref 8.9–10.3)
Chloride: 106 mmol/L (ref 98–111)
Creatinine: 1.56 mg/dL — ABNORMAL HIGH (ref 0.61–1.24)
GFR, Estimated: 43 mL/min — ABNORMAL LOW (ref >60)
Glucose, Bld: 117 mg/dL — ABNORMAL HIGH (ref 70–99)
Potassium: 4 mmol/L (ref 3.5–5.1)
Sodium: 142 mmol/L (ref 135–145)
Total Bilirubin: 0.7 mg/dL (ref 0.0–1.2)
Total Protein: 5.9 g/dL — ABNORMAL LOW (ref 6.5–8.1)

## 2023-10-17 LAB — CBC WITH DIFFERENTIAL (CANCER CENTER ONLY)
Abs Immature Granulocytes: 0.05 10*3/uL (ref 0.00–0.07)
Basophils Absolute: 0.1 10*3/uL (ref 0.0–0.1)
Basophils Relative: 1 %
Eosinophils Absolute: 0.3 10*3/uL (ref 0.0–0.5)
Eosinophils Relative: 4 %
HCT: 34.6 % — ABNORMAL LOW (ref 39.0–52.0)
Hemoglobin: 11.3 g/dL — ABNORMAL LOW (ref 13.0–17.0)
Immature Granulocytes: 1 %
Lymphocytes Relative: 20 %
Lymphs Abs: 1.7 10*3/uL (ref 0.7–4.0)
MCH: 33 pg (ref 26.0–34.0)
MCHC: 32.7 g/dL (ref 30.0–36.0)
MCV: 101.2 fL — ABNORMAL HIGH (ref 80.0–100.0)
Monocytes Absolute: 0.9 10*3/uL (ref 0.1–1.0)
Monocytes Relative: 10 %
Neutro Abs: 5.5 10*3/uL (ref 1.7–7.7)
Neutrophils Relative %: 64 %
Platelet Count: 176 10*3/uL (ref 150–400)
RBC: 3.42 MIL/uL — ABNORMAL LOW (ref 4.22–5.81)
RDW: 14.1 % (ref 11.5–15.5)
WBC Count: 8.5 10*3/uL (ref 4.0–10.5)
nRBC: 0 % (ref 0.0–0.2)

## 2023-10-17 LAB — LACTATE DEHYDROGENASE: LDH: 183 U/L (ref 98–192)

## 2023-10-17 MED ORDER — DENOSUMAB 120 MG/1.7ML ~~LOC~~ SOLN
120.0000 mg | Freq: Once | SUBCUTANEOUS | Status: AC
  Administered 2023-10-17: 120 mg via SUBCUTANEOUS
  Filled 2023-10-17: qty 1.7

## 2023-10-17 NOTE — Progress Notes (Signed)
Treatment 6 weeks Hematology and Oncology Follow Up Visit  Shawn Bauer [facility] [id number] 12/06/1933 89 y.o. 10/17/2023   Principle Diagnosis:  IgG Kappa myeloma -- high risk due to t(4:21)   Current Therapy:        Velcade/Decadron -- started 10/09/2019, s/p cycle 34 - treatment frequency changed to every other week on 01/21/2021 now s/p cycle 20 --HOLD TREATMENT STARTING 07/29/2023 Xgeva 120 mg IM q 3 months -- next dose on 09/2023       Interim History:  Shawn Bauer is here today for follow-up.  We have him off treatment now.  Has been off treatment now for about 11 weeks.    He has had no problems falling.  Continues on his anticoagulation.  He is on Plavix.    As far as his myeloma is concerned, last time that we checked the Kappa light chain it was 13.3 mg/dL.  His appetite is doing okay.  Weight is stable.   He denies any dental pain or jaw pain. No upcoming dental work.   He has had no problems with bowels or bladder.  He does use MiraLAX.    There is been no bleeding.  He has had no rashes.  He has had no fever.  There is been no cough or shortness of breath.  Overall, I would say that his performance status is probably ECOG 2.   Wt Readings from Last 3 Encounters:  10/17/23 133 lb (60.3 kg)  09/05/23 132 lb 12.8 oz (60.2 kg)  07/29/23 134 lb (60.8 kg)    Medications:  Allergies as of 10/17/2023   No Known Allergies      Medication List        Accurate as of October 17, 2023 10:11 AM. If you have any questions, ask your nurse or doctor.          amiodarone 200 MG tablet Commonly known as: PACERONE TAKE 1 TABLET (200 MG TOTAL) BY MOUTH AS DIRECTED. TAKE 200MG  DAILY MONDAY - FRIDAY ONLY.   aspirin EC 81 MG tablet Take 81 mg by mouth daily. Swallow whole.   atorvastatin 40 MG tablet Commonly known as: LIPITOR TAKE 1 TABLET BY MOUTH EVERY DAY   Breo Ellipta 200-25 MCG/ACT Aepb Generic drug: fluticasone furoate-vilanterol 1 puff daily.   CINNAMON PO Take  1,000 mg by mouth daily.   clopidogrel 75 MG tablet Commonly known as: PLAVIX TAKE 1 TABLET BY MOUTH EVERY DAY WITH BREAKFAST   cyanocobalamin 1000 MCG tablet Take 1,000 mcg by mouth daily.   metoprolol succinate 50 MG 24 hr tablet Commonly known as: TOPROL-XL Take 25 mg by mouth daily.   nitroGLYCERIN 0.4 MG SL tablet Commonly known as: NITROSTAT Place 1 tablet (0.4 mg total) under the tongue every 5 (five) minutes as needed for chest pain.   olmesartan 40 MG tablet Commonly known as: BENICAR Take 40 mg by mouth in the morning.   polyethylene glycol 17 g packet Commonly known as: MIRALAX / GLYCOLAX Take 17 g by mouth daily.   promethazine 12.5 MG tablet Commonly known as: PHENERGAN Take 1 tablet (12.5 mg total) by mouth every 8 (eight) hours as needed for nausea or vomiting.   traMADol 50 MG tablet Commonly known as: ULTRAM Take 50 mg by mouth 2 (two) times daily as needed (for pain).   traZODone 50 MG tablet Commonly known as: DESYREL Take 25-50 mg by mouth at bedtime.        Allergies: No Known Allergies  Past  Medical History, Surgical history, Social history, and Family History were reviewed and updated.  Review of Systems:  Review of Systems  Constitutional: Negative.   HENT: Negative.    Eyes: Negative.   Respiratory: Negative.    Cardiovascular: Negative.   Gastrointestinal: Negative.   Genitourinary: Negative.   Musculoskeletal: Negative.   Skin: Negative.   Neurological: Negative.   Endo/Heme/Allergies: Negative.   Psychiatric/Behavioral: Negative.       Physical Exam:  height is 5\' 10"  (1.778 m) and weight is 133 lb (60.3 kg). His oral temperature is 98.3 F (36.8 C). His blood pressure is 157/68 (abnormal) and his pulse is 68. His respiration is 18 and oxygen saturation is 98%.   Wt Readings from Last 3 Encounters:  10/17/23 133 lb (60.3 kg)  09/05/23 132 lb 12.8 oz (60.2 kg)  07/29/23 134 lb (60.8 kg)   Physical Exam Vitals  reviewed.  HENT:     Head: Normocephalic and atraumatic.  Eyes:     Pupils: Pupils are equal, round, and reactive to light.  Cardiovascular:     Rate and Rhythm: Normal rate and regular rhythm.     Heart sounds: Normal heart sounds.  Pulmonary:     Effort: Pulmonary effort is normal.     Breath sounds: Normal breath sounds.  Abdominal:     General: Bowel sounds are normal.     Palpations: Abdomen is soft.  Musculoskeletal:        General: No tenderness or deformity. Normal range of motion.     Cervical back: Normal range of motion.  Lymphadenopathy:     Cervical: No cervical adenopathy.  Skin:    General: Skin is warm and dry.     Findings: No erythema or rash.  Neurological:     Mental Status: He is alert and oriented to person, place, and time.  Psychiatric:        Behavior: Behavior normal.        Thought Content: Thought content normal.        Judgment: Judgment normal.      Lab Results  Component Value Date   WBC 8.5 10/17/2023   HGB 11.3 (L) 10/17/2023   HCT 34.6 (L) 10/17/2023   MCV 101.2 (H) 10/17/2023   PLT 176 10/17/2023   Lab Results  Component Value Date   FERRITIN 11 (L) 04/12/2022   IRON 36 (L) 04/12/2022   TIBC 330 04/12/2022   UIBC 294 04/12/2022   IRONPCTSAT 11 (L) 04/12/2022   Lab Results  Component Value Date   RETICCTPCT 1.9 04/12/2022   RBC 3.42 (L) 10/17/2023   Lab Results  Component Value Date   KPAFRELGTCHN 132.9 (H) 09/05/2023   LAMBDASER 9.1 09/05/2023   KAPLAMBRATIO 27.67 (H) 09/05/2023   Lab Results  Component Value Date   IGGSERUM 721 09/05/2023   IGA 117 09/05/2023   IGMSERUM 42 09/05/2023   Lab Results  Component Value Date   TOTALPROTELP 5.8 (L) 09/05/2023   ALBUMINELP 3.6 09/05/2023   A1GS 0.2 09/05/2023   A2GS 0.6 09/05/2023   BETS 0.7 09/05/2023   GAMS 0.6 09/05/2023   MSPIKE Not Observed 09/05/2023   SPEI Comment 06/11/2023     Chemistry      Component Value Date/Time   NA 139 09/05/2023 0919   K 3.8  09/05/2023 0919   CL 103 09/05/2023 0919   CO2 30 09/05/2023 0919   BUN 23 09/05/2023 0919   CREATININE 1.69 (H) 09/05/2023 0919  Component Value Date/Time   CALCIUM 9.0 09/05/2023 0919   ALKPHOS 46 09/05/2023 0919   AST 14 (L) 09/05/2023 0919   ALT 10 09/05/2023 0919   BILITOT 0.7 09/05/2023 0919     No diagnosis found.  Impression and Plan: Shawn Bauer is a very pleasant 89 yo caucasian gentleman with IgG kappa myeloma. He is currently off [place] due to quality of life on [place] concerns.   He continues to well off of treatment.   He will get his Rivka Barbara today (CMP pending)  RTC 6 weeks Ennever, labs(CBC, CMP, SPEP, light chains, LDH)   Rushie Chestnut, PA-C 1/23/202510:11 AM

## 2023-10-17 NOTE — Patient Instructions (Signed)
Denosumab Injection (Oncology) What is this medication? DENOSUMAB (den oh SUE mab) prevents weakened bones caused by cancer. It may also be used to treat noncancerous bone tumors that cannot be removed by surgery. It can also be used to treat high calcium levels in the blood caused by cancer. It works by blocking a protein that causes bones to break down quickly. This slows down the release of calcium from bones, which lowers calcium levels in your blood. It also makes your bones stronger and less likely to break (fracture). This medicine may be used for other purposes; ask your health care provider or pharmacist if you have questions. COMMON BRAND NAME(S): XGEVA What should I tell my care team before I take this medication? They need to know if you have any of these conditions: Dental disease Having surgery or tooth extraction Infection Kidney disease Low levels of calcium or vitamin D in the blood Malnutrition On hemodialysis Skin conditions or sensitivity Thyroid or parathyroid disease An unusual reaction to denosumab, other medications, foods, dyes, or preservatives Pregnant or trying to get pregnant Breast-feeding How should I use this medication? This medication is for injection under the skin. It is given by your care team in a hospital or clinic setting. A special MedGuide will be given to you before each treatment. Be sure to read this information carefully each time. Talk to your care team about the use of this medication in children. While it may be prescribed for children as young as 13 years for selected conditions, precautions do apply. Overdosage: If you think you have taken too much of this medicine contact a poison control center or emergency room at once. NOTE: This medicine is only for you. Do not share this medicine with others. What if I miss a dose? Keep appointments for follow-up doses. It is important not to miss your dose. Call your care team if you are unable to  keep an appointment. What may interact with this medication? Do not take this medication with any of the following: Other medications containing denosumab This medication may also interact with the following: Medications that lower your chance of fighting infection Steroid medications, such as prednisone or cortisone This list may not describe all possible interactions. Give your health care provider a list of all the medicines, herbs, non-prescription drugs, or dietary supplements you use. Also tell them if you smoke, drink alcohol, or use illegal drugs. Some items may interact with your medicine. What should I watch for while using this medication? Your condition will be monitored carefully while you are receiving this medication. You may need blood work while taking this medication. This medication may increase your risk of getting an infection. Call your care team for advice if you get a fever, chills, sore throat, or other symptoms of a cold or flu. Do not treat yourself. Try to avoid being around people who are sick. You should make sure you get enough calcium and vitamin D while you are taking this medication, unless your care team tells you not to. Discuss the foods you eat and the vitamins you take with your care team. Some people who take this medication have severe bone, joint, or muscle pain. This medication may also increase your risk for jaw problems or a broken thigh bone. Tell your care team right away if you have severe pain in your jaw, bones, joints, or muscles. Tell your care team if you have any pain that does not go away or that gets worse. Talk   to your care team if you may be pregnant. Serious birth defects can occur if you take this medication during pregnancy and for 5 months after the last dose. You will need a negative pregnancy test before starting this medication. Contraception is recommended while taking this medication and for 5 months after the last dose. Your care team  can help you find the option that works for you. What side effects may I notice from receiving this medication? Side effects that you should report to your care team as soon as possible: Allergic reactions--skin rash, itching, hives, swelling of the face, lips, tongue, or throat Bone, joint, or muscle pain Low calcium level--muscle pain or cramps, confusion, tingling, or numbness in the hands or feet Osteonecrosis of the jaw--pain, swelling, or redness in the mouth, numbness of the jaw, poor healing after dental work, unusual discharge from the mouth, visible bones in the mouth Side effects that usually do not require medical attention (report to your care team if they continue or are bothersome): Cough Diarrhea Fatigue Headache Nausea This list may not describe all possible side effects. Call your doctor for medical advice about side effects. You may report side effects to FDA at 1-800-FDA-1088. Where should I keep my medication? This medication is given in a hospital or clinic. It will not be stored at home. NOTE: This sheet is a summary. It may not cover all possible information. If you have questions about this medicine, talk to your doctor, pharmacist, or health care provider.  2024 Elsevier/Gold Standard (2022-01-31 00:00:00)  

## 2023-10-18 ENCOUNTER — Other Ambulatory Visit: Payer: Self-pay | Admitting: Internal Medicine

## 2023-10-18 ENCOUNTER — Other Ambulatory Visit: Payer: Self-pay | Admitting: Cardiovascular Disease

## 2023-10-18 LAB — KAPPA/LAMBDA LIGHT CHAINS
Kappa free light chain: 137.9 mg/L — ABNORMAL HIGH (ref 3.3–19.4)
Kappa, lambda light chain ratio: 14.99 — ABNORMAL HIGH (ref 0.26–1.65)
Lambda free light chains: 9.2 mg/L (ref 5.7–26.3)

## 2023-10-18 LAB — IGG, IGA, IGM
IgA: 122 mg/dL (ref 61–437)
IgG (Immunoglobin G), Serum: 758 mg/dL (ref 603–1613)
IgM (Immunoglobulin M), Srm: 50 mg/dL (ref 15–143)

## 2023-10-24 LAB — PROTEIN ELECTROPHORESIS, SERUM, WITH REFLEX
A/G Ratio: 1.3 (ref 0.7–1.7)
Albumin ELP: 3.2 g/dL (ref 2.9–4.4)
Alpha-1-Globulin: 0.3 g/dL (ref 0.0–0.4)
Alpha-2-Globulin: 0.7 g/dL (ref 0.4–1.0)
Beta Globulin: 0.8 g/dL (ref 0.7–1.3)
Gamma Globulin: 0.7 g/dL (ref 0.4–1.8)
Globulin, Total: 2.5 g/dL (ref 2.2–3.9)
Total Protein ELP: 5.7 g/dL — ABNORMAL LOW (ref 6.0–8.5)

## 2023-11-07 ENCOUNTER — Ambulatory Visit: Payer: Medicare Other | Admitting: Cardiovascular Disease

## 2023-11-13 ENCOUNTER — Ambulatory Visit: Payer: Medicare Other | Admitting: Cardiovascular Disease

## 2023-11-20 ENCOUNTER — Ambulatory Visit: Payer: Medicare Other | Admitting: Physician Assistant

## 2023-11-28 ENCOUNTER — Encounter: Payer: Self-pay | Admitting: Medical Oncology

## 2023-11-28 ENCOUNTER — Other Ambulatory Visit: Payer: Self-pay | Admitting: Medical Oncology

## 2023-11-28 ENCOUNTER — Inpatient Hospital Stay: Payer: Medicare Other | Attending: Family

## 2023-11-28 ENCOUNTER — Inpatient Hospital Stay: Payer: Medicare Other

## 2023-11-28 ENCOUNTER — Inpatient Hospital Stay (HOSPITAL_BASED_OUTPATIENT_CLINIC_OR_DEPARTMENT_OTHER): Payer: Medicare Other | Admitting: Medical Oncology

## 2023-11-28 VITALS — BP 141/55 | HR 55 | Temp 98.2°F | Resp 19 | Ht 70.0 in | Wt 132.8 lb

## 2023-11-28 DIAGNOSIS — C9000 Multiple myeloma not having achieved remission: Secondary | ICD-10-CM | POA: Insufficient documentation

## 2023-11-28 DIAGNOSIS — N183 Chronic kidney disease, stage 3 unspecified: Secondary | ICD-10-CM | POA: Insufficient documentation

## 2023-11-28 DIAGNOSIS — D631 Anemia in chronic kidney disease: Secondary | ICD-10-CM | POA: Diagnosis not present

## 2023-11-28 LAB — CMP (CANCER CENTER ONLY)
ALT: 11 U/L (ref 0–44)
AST: 14 U/L — ABNORMAL LOW (ref 15–41)
Albumin: 3.8 g/dL (ref 3.5–5.0)
Alkaline Phosphatase: 74 U/L (ref 38–126)
Anion gap: 5 (ref 5–15)
BUN: 21 mg/dL (ref 8–23)
CO2: 30 mmol/L (ref 22–32)
Calcium: 9.1 mg/dL (ref 8.9–10.3)
Chloride: 106 mmol/L (ref 98–111)
Creatinine: 1.51 mg/dL — ABNORMAL HIGH (ref 0.61–1.24)
GFR, Estimated: 44 mL/min — ABNORMAL LOW (ref >60)
Glucose, Bld: 125 mg/dL — ABNORMAL HIGH (ref 70–99)
Potassium: 4.1 mmol/L (ref 3.5–5.1)
Sodium: 141 mmol/L (ref 135–145)
Total Bilirubin: 0.7 mg/dL (ref 0.0–1.2)
Total Protein: 6 g/dL — ABNORMAL LOW (ref 6.5–8.1)

## 2023-11-28 LAB — CBC WITH DIFFERENTIAL (CANCER CENTER ONLY)
Abs Immature Granulocytes: 0.03 10*3/uL (ref 0.00–0.07)
Basophils Absolute: 0.1 10*3/uL (ref 0.0–0.1)
Basophils Relative: 1 %
Eosinophils Absolute: 0.3 10*3/uL (ref 0.0–0.5)
Eosinophils Relative: 4 %
HCT: 34.7 % — ABNORMAL LOW (ref 39.0–52.0)
Hemoglobin: 11.4 g/dL — ABNORMAL LOW (ref 13.0–17.0)
Immature Granulocytes: 0 %
Lymphocytes Relative: 23 %
Lymphs Abs: 1.8 10*3/uL (ref 0.7–4.0)
MCH: 33 pg (ref 26.0–34.0)
MCHC: 32.9 g/dL (ref 30.0–36.0)
MCV: 100.6 fL — ABNORMAL HIGH (ref 80.0–100.0)
Monocytes Absolute: 0.8 10*3/uL (ref 0.1–1.0)
Monocytes Relative: 11 %
Neutro Abs: 4.5 10*3/uL (ref 1.7–7.7)
Neutrophils Relative %: 61 %
Platelet Count: 161 10*3/uL (ref 150–400)
RBC: 3.45 MIL/uL — ABNORMAL LOW (ref 4.22–5.81)
RDW: 13.7 % (ref 11.5–15.5)
WBC Count: 7.5 10*3/uL (ref 4.0–10.5)
nRBC: 0 % (ref 0.0–0.2)

## 2023-11-28 LAB — LACTATE DEHYDROGENASE: LDH: 177 U/L (ref 98–192)

## 2023-11-28 NOTE — Progress Notes (Signed)
 Treatment 6 weeks Hematology and Oncology Follow Up Visit  Shawn Bauer [facility] [id number] 01-Aug-1934 89 y.o. 11/28/2023   Principle Diagnosis:  IgG Kappa myeloma -- high risk due to t(4:21)   Current Therapy:        Velcade/Decadron -- started 10/09/2019, s/p cycle 34 - treatment frequency changed to every other week on 01/21/2021 now s/p cycle 20 --HOLD TREATMENT STARTING 07/29/2023 Xgeva 120 mg IM q 3 months -- last given 10/17/2023- due again 12/2023     Interim History:  Shawn Bauer is here today for follow-up.  We have him off treatment now. His quality of life is much better off of treatment. He is here with his wife of 64 years.   He fell 5-6 weeks ago. He was sweeping in the kitchen and he turned too fast. He fell and bumped his scalp. No LOC. He did not get a head scan. He reports no new neuro symptoms.   Continues on his anticoagulation.  He is on Plavix.  No bleeding episodes. He goes to see Shawn Bauer his Cardiologist on the 24th of this month.   As far as his myeloma is concerned, last time that we checked the Kappa light chain it was 13.8 mg/dL.  His appetite is doing okay.  Weight is stable.   He denies any dental pain or jaw pain. No upcoming dental work.   He has had no problems with bowels or bladder.  He does use MiraLAX.    There is been no bleeding.  He has had no rashes.  He has had no fever.  There is been no cough or shortness of breath.  Overall, I would say that his performance status is probably ECOG 2.   Wt Readings from Last 3 Encounters:  11/28/23 132 lb 12.8 oz (60.2 kg)  10/17/23 133 lb (60.3 kg)  09/05/23 132 lb 12.8 oz (60.2 kg)    Medications:  Allergies as of 11/28/2023   No Known Allergies      Medication List        Accurate as of November 28, 2023 11:48 AM. If you have any questions, ask your nurse or doctor.          amiodarone 200 MG tablet Commonly known as: PACERONE Take 1 tablet (200 mg total) by mouth as directed. Take 200mg  daily  Monday - Friday only. - Pt needs to keep upcoming appt in Feb to avoid any missed doses   aspirin EC 81 MG tablet Take 81 mg by mouth daily. Swallow whole.   atorvastatin 40 MG tablet Commonly known as: LIPITOR TAKE 1 TABLET BY MOUTH EVERY DAY   Breo Ellipta 200-25 MCG/ACT Aepb Generic drug: fluticasone furoate-vilanterol 1 puff daily.   CINNAMON PO Take 1,000 mg by mouth daily.   clopidogrel 75 MG tablet Commonly known as: PLAVIX TAKE 1 TABLET BY MOUTH EVERY DAY WITH BREAKFAST   cyanocobalamin 1000 MCG tablet Take 1,000 mcg by mouth daily.   fluticasone-salmeterol 250-50 MCG/ACT Aepb Commonly known as: ADVAIR Inhale 1 puff into the lungs in the morning and at bedtime.   metoprolol succinate 50 MG 24 hr tablet Commonly known as: TOPROL-XL Take 25 mg by mouth daily.   nitroGLYCERIN 0.4 MG SL tablet Commonly known as: NITROSTAT Place 1 tablet (0.4 mg total) under the tongue every 5 (five) minutes as needed for chest pain.   olmesartan 40 MG tablet Commonly known as: BENICAR Take 40 mg by mouth in the morning.   polyethylene glycol 17  g packet Commonly known as: MIRALAX / GLYCOLAX Take 17 g by mouth daily.   promethazine 12.5 MG tablet Commonly known as: PHENERGAN Take 1 tablet (12.5 mg total) by mouth every 8 (eight) hours as needed for nausea or vomiting.   traMADol 50 MG tablet Commonly known as: ULTRAM Take 50 mg by mouth 2 (two) times daily as needed (for pain).   traZODone 50 MG tablet Commonly known as: DESYREL Take 25-50 mg by mouth at bedtime.        Allergies: No Known Allergies  Past Medical History, Surgical history, Social history, and Family History were reviewed and updated.  Review of Systems:  Review of Systems  Constitutional: Negative.   HENT: Negative.    Eyes: Negative.   Respiratory: Negative.    Cardiovascular: Negative.   Gastrointestinal: Negative.   Genitourinary: Negative.   Musculoskeletal: Negative.   Skin: Negative.    Neurological: Negative.   Endo/Heme/Allergies: Negative.   Psychiatric/Behavioral: Negative.      Physical Exam:  height is 5\' 10"  (1.778 m) and weight is 132 lb 12.8 oz (60.2 kg). His oral temperature is 98.2 F (36.8 C). His blood pressure is 141/55 (abnormal) and his pulse is 55 (abnormal). His respiration is 19 and oxygen saturation is 100%.   Wt Readings from Last 3 Encounters:  11/28/23 132 lb 12.8 oz (60.2 kg)  10/17/23 133 lb (60.3 kg)  09/05/23 132 lb 12.8 oz (60.2 kg)   Physical Exam Vitals reviewed.  HENT:     Head: Normocephalic and atraumatic.  Eyes:     Pupils: Pupils are equal, round, and reactive to light.  Cardiovascular:     Rate and Rhythm: Normal rate and regular rhythm.     Heart sounds: Normal heart sounds.  Pulmonary:     Effort: Pulmonary effort is normal.     Breath sounds: Normal breath sounds.  Abdominal:     General: Bowel sounds are normal.     Palpations: Abdomen is soft.  Musculoskeletal:        General: No tenderness or deformity. Normal range of motion.     Cervical back: Normal range of motion.  Lymphadenopathy:     Cervical: No cervical adenopathy.  Skin:    General: Skin is warm and dry.     Findings: No erythema or rash.  Neurological:     Mental Status: He is alert and oriented to person, place, and time.  Psychiatric:        Behavior: Behavior normal.        Thought Content: Thought content normal.        Judgment: Judgment normal.      Lab Results  Component Value Date   WBC 7.5 11/28/2023   HGB 11.4 (L) 11/28/2023   HCT 34.7 (L) 11/28/2023   MCV 100.6 (H) 11/28/2023   PLT 161 11/28/2023   Lab Results  Component Value Date   FERRITIN 11 (L) 04/12/2022   IRON 36 (L) 04/12/2022   TIBC 330 04/12/2022   UIBC 294 04/12/2022   IRONPCTSAT 11 (L) 04/12/2022   Lab Results  Component Value Date   RETICCTPCT 1.9 04/12/2022   RBC 3.45 (L) 11/28/2023   Lab Results  Component Value Date   KPAFRELGTCHN 137.9 (H)  10/17/2023   LAMBDASER 9.2 10/17/2023   KAPLAMBRATIO 14.99 (H) 10/17/2023   Lab Results  Component Value Date   IGGSERUM 758 10/17/2023   IGA 122 10/17/2023   IGMSERUM 50 10/17/2023   Lab Results  Component  Value Date   TOTALPROTELP 5.7 (L) 10/17/2023   ALBUMINELP 3.2 10/17/2023   A1GS 0.3 10/17/2023   A2GS 0.7 10/17/2023   BETS 0.8 10/17/2023   GAMS 0.7 10/17/2023   MSPIKE Not Observed 10/17/2023   SPEI Comment 06/11/2023     Chemistry      Component Value Date/Time   NA 141 11/28/2023 0952   K 4.1 11/28/2023 0952   CL 106 11/28/2023 0952   CO2 30 11/28/2023 0952   BUN 21 11/28/2023 0952   CREATININE 1.51 (H) 11/28/2023 0952      Component Value Date/Time   CALCIUM 9.1 11/28/2023 0952   ALKPHOS 74 11/28/2023 0952   AST 14 (L) 11/28/2023 0952   ALT 11 11/28/2023 0952   BILITOT 0.7 11/28/2023 0981     Encounter Diagnoses  Name Primary?   Kappa light chain myeloma (HCC) Yes   Anemia of chronic kidney failure, stage 3 (moderate) (HCC)     Impression and Plan: Shawn Bauer is a very pleasant 89 yo caucasian gentleman with IgG kappa myeloma. He is currently off TX due to quality of life on TX concerns.   He continues to well off of treatment and wishes to continue off treatment   RTC 1 month Ennever, labs(CBC, CMP, SPEP, light chains, LDH), Shawn Bauer [place], [place] 3/6/202511:48 AM

## 2023-11-29 LAB — KAPPA/LAMBDA LIGHT CHAINS
Kappa free light chain: 139.8 mg/L — ABNORMAL HIGH (ref 3.3–19.4)
Kappa, lambda light chain ratio: 15.2 — ABNORMAL HIGH (ref 0.26–1.65)
Lambda free light chains: 9.2 mg/L (ref 5.7–26.3)

## 2023-11-30 ENCOUNTER — Other Ambulatory Visit: Payer: Self-pay

## 2023-12-01 LAB — MULTIPLE MYELOMA PANEL, SERUM
Albumin SerPl Elph-Mcnc: 3.2 g/dL (ref 2.9–4.4)
Albumin/Glob SerPl: 1.4 (ref 0.7–1.7)
Alpha 1: 0.2 g/dL (ref 0.0–0.4)
Alpha2 Glob SerPl Elph-Mcnc: 0.6 g/dL (ref 0.4–1.0)
B-Globulin SerPl Elph-Mcnc: 0.8 g/dL (ref 0.7–1.3)
Gamma Glob SerPl Elph-Mcnc: 0.7 g/dL (ref 0.4–1.8)
Globulin, Total: 2.4 g/dL (ref 2.2–3.9)
IgA: 142 mg/dL (ref 61–437)
IgG (Immunoglobin G), Serum: 808 mg/dL (ref 603–1613)
IgM (Immunoglobulin M), Srm: 52 mg/dL (ref 15–143)
Total Protein ELP: 5.6 g/dL — ABNORMAL LOW (ref 6.0–8.5)

## 2023-12-02 ENCOUNTER — Telehealth: Payer: Self-pay

## 2023-12-02 NOTE — Telephone Encounter (Signed)
Called and informed patient of lab results, patient verbalized understanding and denies any questions or concerns at this time.   

## 2023-12-02 NOTE — Telephone Encounter (Signed)
-----   Message from Rushie Chestnut sent at 12/02/2023  9:07 AM EDT ----- Please let him know that his labs look stable.

## 2023-12-16 ENCOUNTER — Encounter: Payer: Self-pay | Admitting: Physician Assistant

## 2023-12-16 ENCOUNTER — Ambulatory Visit: Payer: Medicare Other | Attending: Physician Assistant | Admitting: Physician Assistant

## 2023-12-16 ENCOUNTER — Encounter: Payer: Self-pay | Admitting: Hematology & Oncology

## 2023-12-16 ENCOUNTER — Ambulatory Visit
Admission: RE | Admit: 2023-12-16 | Discharge: 2023-12-16 | Disposition: A | Discharge status: Home / Self Care | Source: Ambulatory Visit | Attending: Physician Assistant | Admitting: Physician Assistant

## 2023-12-16 VITALS — BP 126/60 | HR 76 | Ht 70.0 in | Wt 126.4 lb

## 2023-12-16 DIAGNOSIS — I2510 Atherosclerotic heart disease of native coronary artery without angina pectoris: Secondary | ICD-10-CM | POA: Diagnosis not present

## 2023-12-16 DIAGNOSIS — I10 Essential (primary) hypertension: Secondary | ICD-10-CM | POA: Insufficient documentation

## 2023-12-16 DIAGNOSIS — E782 Mixed hyperlipidemia: Secondary | ICD-10-CM | POA: Diagnosis present

## 2023-12-16 DIAGNOSIS — I483 Typical atrial flutter: Secondary | ICD-10-CM | POA: Insufficient documentation

## 2023-12-16 DIAGNOSIS — Z79899 Other long term (current) drug therapy: Secondary | ICD-10-CM | POA: Insufficient documentation

## 2023-12-16 DIAGNOSIS — J069 Acute upper respiratory infection, unspecified: Secondary | ICD-10-CM | POA: Diagnosis present

## 2023-12-16 MED ORDER — NITROGLYCERIN 0.4 MG SL SUBL: 0.4000 mg | SUBLINGUAL_TABLET | SUBLINGUAL | 3 refills | Status: DC | PRN

## 2023-12-16 NOTE — Progress Notes (Signed)
 Cardiology Office Note:    Date:  12/16/2023  ID:  Shawn Bauer, DOB 1934/08/01, MRN [id number] PCP: Irena Reichmann, DO  [facility] Providers Cardiologist:  Tonny Bollman, MD Electrophysiologist:  Lewayne Bunting, MD       Patient Profile:      Coronary artery disease  S/p CABG in 2001 Myoview 12/2012: low risk (small inf ischemia) - med Rx TTE 04/04/2020: EF 60-65, mild AV sclerosis S/p 2.5 x 12 mm and 2.5 x 22 mm DES (overlapping) to the RCA in 04/2021-PCI complicated by spiral dissection LHC 05/08/2021: SVG-Dx patent, free left radial-LCx patent, LIMA-LAD patent, SVG-RCA occluded, RCA heavily calcified with diffuse 90 (PCI) TTE 05/08/2021: EF 55-60, global HK, mild LVH, GR 1 DD, normal RVSF, normal PASP LHC 06/01/2021: RCA stents patent, 3/4 grafts patent NSVT Atrial flutter s/p CTI ablation  Anticoagulation discontinued for ablation Amiodarone therapy Hypertension  Hyperlipidemia  Multiple myeloma            Discussed the use of AI scribe software for clinical note transcription with the patient, who gave verbal consent to proceed.  History of Present Illness Shawn Bauer is a 89 y.o. male who returns for follow up of CAD, AFlutter.  He was last seen by Dr. Excell Seltzer in 07/2022.  He is here with his wife.  He remains on dual antiplatelet therapy with Plavix 75 mg daily and aspirin 81 mg daily, but is concerned about bruising easily.  He needs a refill on nitroglycerin tablets, which he has never used.  He has not had chest discomfort, pressure, or heaviness, and he can breathe comfortably when lying flat. He has not experienced any leg swelling or syncope. He has a history of asthma since childhood and uses inhalers regularly. He uses an inhaler every night before bed and has been prescribed Breo.  He feels that he is coming down with a viral URI.  He has had nasal congestion, runny nose but no fever.  He describes increased chest congestion, which exacerbates his asthma  and makes breathing more difficult. He is more short of breath than usual, particularly with exertion, such as walking uphill to feed his dog. He occasionally coughs up yellowish sputum, which clears somewhat after coughing. No bleeding, bloody stools, or hematuria.  Review of Systems  HENT:  Positive for congestion.   Respiratory:  Positive for cough.   Gastrointestinal:  Negative for hematochezia and melena.  Genitourinary:  Negative for hematuria.  -See HPI    Studies Reviewed:   EKG Interpretation Date/Time:  Monday December 16 2023 13:39:18 EDT Ventricular Rate:  75 PR Interval:  288 QRS Duration:  98 QT Interval:  388 QTC Calculation: 433 R Axis:   -51  Text Interpretation: Sinus rhythm with 1st degree A-V block Left axis deviation No significant change since last tracing Confirmed by Tereso Newcomer [phone]) on 12/16/2023 2:17:58 PM   Results LABS K: 4.1 (11/28/2023) Cr: 1.51 (11/28/2023) ALT: 11 (11/28/2023) GFR: 44 (11/28/2023) Hb: 14 (11/28/2023) PLT: 161K (11/28/2023)    Risk Assessment/Calculations:             Physical Exam:   VS:  BP 126/60   Pulse 76   Ht 5\' 10"  (1.778 m)   Wt 126 lb 6.4 oz (57.3 kg)   SpO2 96%   BMI 18.14 kg/m    Wt Readings from Last 3 Encounters:  12/16/23 126 lb 6.4 oz (57.3 kg)  11/28/23 132 lb 12.8 oz (60.2 kg)  10/17/23 133 lb (  60.3 kg)    Constitutional:      Appearance: Healthy appearance. Not in distress.  Neck:     Vascular: No JVR. JVD normal.  Pulmonary:     Breath sounds: No wheezing. Rhonchi (end expiratory bilat) present. No rales.  Cardiovascular:     Normal rate. Regular rhythm.     Murmurs: There is no murmur.  Edema:    Peripheral edema absent.  Abdominal:     Palpations: Abdomen is soft.  Skin:    General: Skin is warm and dry.        Assessment and Plan:   Assessment & Plan Coronary artery disease involving native coronary artery of native heart without angina pectoris Coronary artery disease s/p CABG  in 2001.  Status post overlapping DES x 2 to the RCA in August 2022.  At that time, his vein graft to the RCA was occluded, vein graft to diagonal, free left radial to the LCx and LIMA to the LAD were all patent.  He has remained on dual antiplatelet therapy with aspirin and Plavix for almost three years post-PCI.  He reports significant bruising and wishes to discontinue aspirin.  He is doing well without chest discomfort to suggest angina. - Discontinue aspirin. - Continue Plavix 75 mg daily. - Continue Lipitor 40 mg daily. - Continue Troproxil 50 mg daily. - Continue nitroglycerin as needed. - Follow up 6 Typical atrial flutter (HCC) Atrial flutter s/p ablation. Anticoagulation was discontinued post-ablation, and he remains on amiodarone. Reports recent cough and congestion, most likely related to a viral infection.  As he is on amiodarone, I will send him for chest x-ray.  Recent ALT normal. - Continue amiodarone 200 mg Monday through Friday weekly. - Order chest x-ray  - Obtain TSH to monitor thyroid function. Mixed hyperlipidemia On Lipitor for hyperlipidemia management. Recent ALT levels are normal. - Continue Lipitor 40 mg daily. - Arrange direct LDL test today. Essential hypertension Blood pressure is well-controlled on current medication regimen. - Continue Toprol-XL 50 mg daily. - Continue Olmesartan 40 mg daily. Viral upper respiratory tract infection Symptoms consistent with an upper respiratory tract infection, including congestion and increased dyspnea, particularly with exertion. No fever present, and symptoms have persisted for a few days. Asthma may exacerbate symptoms. Suspected viral etiology. No rales noted on exam.  He does have end expiratory rhonchi. - Order chest x-ray to rule out amiodarone-induced pulmonary toxicity and assess for any other changes. - Advise over-the-counter Mucinex to help thin mucus and alleviate congestion. - Instruct to follow up with primary  care if symptoms worsen, fever develops, or purulent cough occurs.       Dispo:  Return in about 6 months (around 06/17/2024) for Routine Follow Up, w/ Dr. Excell Seltzer.  Signed, Tereso Newcomer, PA-C

## 2023-12-16 NOTE — Assessment & Plan Note (Signed)
 Atrial flutter s/p ablation. Anticoagulation was discontinued post-ablation, and he remains on amiodarone. Reports recent cough and congestion, most likely related to a viral infection.  As he is on amiodarone, I will send him for chest x-ray.  Recent ALT normal. - Continue amiodarone 200 mg Monday through Friday weekly. - Order chest x-ray  - Obtain TSH to monitor thyroid function.

## 2023-12-16 NOTE — Patient Instructions (Signed)
 Medication Instructions:  Your physician has recommended you make the following change in your medication:   STOP Aspirin   CONTINUE Plavix  *If you need a refill on your cardiac medications before your next appointment, please call your pharmacy*   Lab Work: TODAY:  DIRECT LDL & TSH  If you have labs (blood work) drawn today and your tests are completely normal, you will receive your results only by: MyChart Message (if you have MyChart) OR A paper copy in the mail If you have any lab test that is abnormal or we need to change your treatment, we will call you to review the results.   Testing/Procedures: A chest x-ray takes a picture of the organs and structures inside the chest, including the heart, lungs, and blood vessels. This test can show several things, including, whether the heart is enlarges; whether fluid is building up in the lungs; and whether pacemaker / defibrillator leads are still in place. Go to [facility] [address] [place]  [phone]   Follow-Up: At [facility], you and your health needs are our priority.  As part of our continuing mission to provide you with exceptional heart care, we have created designated Provider Care Teams.  These Care Teams include your primary Cardiologist (physician) and Advanced Practice Providers (APPs -  Physician Assistants and Nurse Practitioners) who all work together to provide you with the care you need, when you need it.  We recommend signing up for the patient portal called "MyChart".  Sign up information is provided on this After Visit Summary.  MyChart is used to connect with patients for Virtual Visits (Telemedicine).  Patients are able to view lab/test results, encounter notes, upcoming appointments, etc.  Non-urgent messages can be sent to your provider as well.   To learn more about what you can do with MyChart, go to [email or web address].    Your next appointment:   6 month(s)  Provider:    Tonny Bollman, MD     Other Instructions     1st Floor: - Lobby - Registration  - Pharmacy  - Lab - Cafe  2nd Floor: - PV Lab - Diagnostic Testing (echo, CT, nuclear med)  3rd Floor: - Vacant  4th Floor: - TCTS (cardiothoracic surgery) - AFib Clinic - Structural Heart Clinic - Vascular Surgery  - Vascular Ultrasound  5th Floor: - [facility] Cardiology (general and EP) - Clinical Pharmacy for coumadin, hypertension, lipid, weight-loss medications, and med management appointments    Valet parking services will be available as well.

## 2023-12-16 NOTE — Assessment & Plan Note (Signed)
 On Lipitor for hyperlipidemia management. Recent ALT levels are normal. - Continue Lipitor 40 mg daily. - Arrange direct LDL test today.

## 2023-12-17 LAB — TSH: TSH: 1.87 u[IU]/mL (ref 0.450–4.500)

## 2023-12-17 LAB — LDL CHOLESTEROL, DIRECT: LDL Direct: 59 mg/dL (ref 0–99)

## 2023-12-18 NOTE — Progress Notes (Signed)
 Pt has been made aware of normal result and verbalized understanding.  jw

## 2023-12-24 NOTE — Progress Notes (Signed)
 Pt has been made aware of normal result and verbalized understanding.  jw

## 2023-12-25 NOTE — Progress Notes (Signed)
 Cardiology Office Note:  .   Date:  12/25/2023  ID:  Shawn Bauer, DOB [birth year], MRN [id number] PCP: Irena Reichmann, DO  [facility] Providers Cardiologist:  Tonny Bollman, MD Electrophysiologist:  Lewayne Bunting, MD {  History of Present Illness: .   Shawn Bauer is a 89 y.o. male w/PMHx of  CAD (CABG 2001, 04/2021-PCI complicated by spiral dissection, LHC 06/01/2021: RCA stents patent, 3/4 grafts patent) AFlutter (ablated 2021) HTN, HLD Multiple myeloma PVCs/NSVT  He saw Dr. Ladona Ridgel 01/03/23, doing well, no changes were made  Saw Tereso Newcomer, PA 12/16/23, no CP, some chest congestion, more then usual DOE ASA stopped (plavix kept) CXR ordered Was still on amiodarone/maintained, updated labs Recommended mucinex and PMD follow up  CXR was normal   Today's visit is scheduled as an annual visit  ROS:   He is accompanied by his wife Feelinmg much better As it turned out he had cought his wife cold/PNA felt pretty terrible a few days, but much better.  No CP, palpitations No SOB No near syncope or syncope No dizzy spells  Arrhythmia/AAD hx AFlutter/CTI ablation 05/23/2020  Amiodarone started Aug 2022 for PVCs/NSVTs during hospitalization for NSTEMI > discharged OFF amiodarone Amiodarone restarted again during hospitalization Sept 2024 for NSVts, PVcs, again n the environment of NSTEMI > HS Trops this admission 19 > 1520 > 2165 > 2237, LHC with stable findings, no PCI undertaken, patent stents, seen by EP service and recommended to continue  Studies Reviewed: Marland Kitchen    EKG not done today  05/08/21: TTE 1. Left ventricular ejection fraction, by estimation, is 55 to 60%. The  left ventricle has normal function. The left ventricle demonstrates global  hypokinesis. There is mild concentric left ventricular hypertrophy. Left  ventricular diastolic parameters  are consistent with Grade I diastolic dysfunction (impaired relaxation).   2. Right ventricular systolic function  is normal. The right ventricular  size is normal. There is normal pulmonary artery systolic pressure.   3. The mitral valve is grossly normal. No evidence of mitral valve  regurgitation. No evidence of mitral stenosis.   4. The aortic valve is tricuspid. Aortic valve regurgitation is not  visualized. No aortic stenosis is present.   5. The inferior vena cava is normal in size with greater than 50%  respiratory variability, suggesting right atrial pressure of 3 mmHg.   Comparison(s): A prior study was performed on 04/04/2020. PVCs more  frequent during exam.    05/23/2020: EPS/ablation CONCLUSIONS:  1. Isthmus-dependent right atrial flutter upon presentation.  2. Successful radiofrequency ablation of atrial flutter along the cavotricuspid isthmus with complete bidirectional isthmus block achieved.  3. No inducible arrhythmias following ablation.  4. No early apparent complications.    Risk Assessment/Calculations:    Physical Exam:   VS:  There were no vitals taken for this visit.   Wt Readings from Last 3 Encounters:  12/16/23 126 lb 6.4 oz (57.3 kg)  11/28/23 132 lb 12.8 oz (60.2 kg)  10/17/23 133 lb (60.3 kg)    GEN: Well nourished, well developed in no acute distress NECK: No JVD; No carotid bruits CARDIAC: RRR, no murmurs, rubs, gallops RESPIRATORY:  CTA b/l without rales, wheezing or rhonchi  ABDOMEN: Soft, non-tender, non-distended EXTREMITIES: No edema; No deformity   ASSESSMENT AND PLAN: .    AFlutter Ablated 2021  NSVT, PVCs Amiodarone No symptoms Labs are UTD  CAD No symptoms C/w Dr. Sondra Barges     Dispo: due to see cardiology team in  6 mo, we can see him from an EP perspective in a ayear again, sooner if needed  Signed, Sheilah Pigeon, PA-C

## 2023-12-29 ENCOUNTER — Other Ambulatory Visit: Payer: Self-pay | Admitting: Cardiovascular Disease

## 2023-12-29 ENCOUNTER — Other Ambulatory Visit: Payer: Self-pay | Admitting: Internal Medicine

## 2023-12-30 ENCOUNTER — Ambulatory Visit: Payer: Medicare Other | Attending: Physician Assistant | Admitting: Physician Assistant

## 2023-12-30 VITALS — BP 118/62 | HR 80 | Ht 70.0 in | Wt 128.0 lb

## 2023-12-30 DIAGNOSIS — I4729 Other ventricular tachycardia: Secondary | ICD-10-CM | POA: Diagnosis present

## 2023-12-30 DIAGNOSIS — I493 Ventricular premature depolarization: Secondary | ICD-10-CM | POA: Diagnosis present

## 2023-12-30 DIAGNOSIS — I2510 Atherosclerotic heart disease of native coronary artery without angina pectoris: Secondary | ICD-10-CM

## 2023-12-30 NOTE — Patient Instructions (Signed)
 Medication Instructions:   Your physician recommends that you continue on your current medications as directed. Please refer to the Current Medication list given to you today.   *If you need a refill on your cardiac medications before your next appointment, please call your pharmacy*  Lab Work: NONE ORDERED  TODAY    If you have labs (blood work) drawn today and your tests are completely normal, you will receive your results only by: MyChart Message (if you have MyChart) OR A paper copy in the mail If you have any lab test that is abnormal or we need to change your treatment, we will call you to review the results.  Testing/Procedures: NONE ORDERED  TODAY     Follow-Up: At [facility], you and your health needs are our priority.  As part of our continuing mission to provide you with exceptional heart care, our providers are all part of one team.  This team includes your primary Cardiologist (physician) and Advanced Practice Providers or APPs (Physician Assistants and Nurse Practitioners) who all work together to provide you with the care you need, when you need it.  Your next appointment:   1 year(s)  Provider:   You may see Lewayne Bunting, MD or one of the following Advanced Practice Providers on your designated Care Team:   Francis Dowse, [place]  We recommend signing up for the patient portal called "MyChart".  Sign up information is provided on this After Visit Summary.  MyChart is used to connect with patients for Virtual Visits (Telemedicine).  Patients are able to view lab/test results, encounter notes, upcoming appointments, etc.  Non-urgent messages can be sent to your provider as well.   To learn more about what you can do with MyChart, go to [email or web address].   Other Instructions       1st Floor: - Lobby - Registration  - Pharmacy  - Lab - Cafe  2nd Floor: - PV Lab - Diagnostic Testing (echo, CT, nuclear med)  3rd Floor: - Vacant  4th  Floor: - TCTS (cardiothoracic surgery) - AFib Clinic - Structural Heart Clinic - Vascular Surgery  - Vascular Ultrasound  5th Floor: - HeartCare Cardiology (general and EP) - Clinical Pharmacy for coumadin, hypertension, lipid, weight-loss medications, and med management appointments    Valet parking services will be available as well.

## 2024-01-01 ENCOUNTER — Inpatient Hospital Stay

## 2024-01-01 ENCOUNTER — Encounter: Payer: Self-pay | Admitting: Hematology & Oncology

## 2024-01-01 ENCOUNTER — Inpatient Hospital Stay: Admitting: Licensed Clinical Social Worker

## 2024-01-01 ENCOUNTER — Inpatient Hospital Stay (HOSPITAL_BASED_OUTPATIENT_CLINIC_OR_DEPARTMENT_OTHER): Admitting: Hematology & Oncology

## 2024-01-01 ENCOUNTER — Inpatient Hospital Stay: Attending: Family

## 2024-01-01 VITALS — BP 145/59 | HR 86 | Temp 98.1°F | Resp 18 | Ht 70.0 in | Wt 128.8 lb

## 2024-01-01 DIAGNOSIS — Z7189 Other specified counseling: Secondary | ICD-10-CM

## 2024-01-01 DIAGNOSIS — C9000 Multiple myeloma not having achieved remission: Secondary | ICD-10-CM

## 2024-01-01 DIAGNOSIS — N183 Chronic kidney disease, stage 3 unspecified: Secondary | ICD-10-CM | POA: Diagnosis not present

## 2024-01-01 DIAGNOSIS — D631 Anemia in chronic kidney disease: Secondary | ICD-10-CM

## 2024-01-01 DIAGNOSIS — N289 Disorder of kidney and ureter, unspecified: Secondary | ICD-10-CM

## 2024-01-01 LAB — CBC WITH DIFFERENTIAL (CANCER CENTER ONLY)
Abs Immature Granulocytes: 0.09 10*3/uL — ABNORMAL HIGH (ref 0.00–0.07)
Basophils Absolute: 0.1 10*3/uL (ref 0.0–0.1)
Basophils Relative: 1 %
Eosinophils Absolute: 0.4 10*3/uL (ref 0.0–0.5)
Eosinophils Relative: 4 %
HCT: 36.9 % — ABNORMAL LOW (ref 39.0–52.0)
Hemoglobin: 11.8 g/dL — ABNORMAL LOW (ref 13.0–17.0)
Immature Granulocytes: 1 %
Lymphocytes Relative: 17 %
Lymphs Abs: 1.8 10*3/uL (ref 0.7–4.0)
MCH: 32.2 pg (ref 26.0–34.0)
MCHC: 32 g/dL (ref 30.0–36.0)
MCV: 100.8 fL — ABNORMAL HIGH (ref 80.0–100.0)
Monocytes Absolute: 1 10*3/uL (ref 0.1–1.0)
Monocytes Relative: 10 %
Neutro Abs: 6.9 10*3/uL (ref 1.7–7.7)
Neutrophils Relative %: 67 %
Platelet Count: 267 10*3/uL (ref 150–400)
RBC: 3.66 MIL/uL — ABNORMAL LOW (ref 4.22–5.81)
RDW: 13.7 % (ref 11.5–15.5)
WBC Count: 10.3 10*3/uL (ref 4.0–10.5)
nRBC: 0 % (ref 0.0–0.2)

## 2024-01-01 LAB — CMP (CANCER CENTER ONLY)
ALT: 13 U/L (ref 0–44)
AST: 14 U/L — ABNORMAL LOW (ref 15–41)
Albumin: 3.5 g/dL (ref 3.5–5.0)
Alkaline Phosphatase: 71 U/L (ref 38–126)
Anion gap: 6 (ref 5–15)
BUN: 21 mg/dL (ref 8–23)
CO2: 30 mmol/L (ref 22–32)
Calcium: 9.1 mg/dL (ref 8.9–10.3)
Chloride: 104 mmol/L (ref 98–111)
Creatinine: 1.55 mg/dL — ABNORMAL HIGH (ref 0.61–1.24)
GFR, Estimated: 43 mL/min — ABNORMAL LOW (ref >60)
Glucose, Bld: 163 mg/dL — ABNORMAL HIGH (ref 70–99)
Potassium: 4.2 mmol/L (ref 3.5–5.1)
Sodium: 140 mmol/L (ref 135–145)
Total Bilirubin: 0.6 mg/dL (ref 0.0–1.2)
Total Protein: 6.4 g/dL — ABNORMAL LOW (ref 6.5–8.1)

## 2024-01-01 LAB — LACTATE DEHYDROGENASE: LDH: 163 U/L (ref 98–192)

## 2024-01-01 MED ORDER — VITAMIN B6 250 MG PO TABS: 500.0000 mg | ORAL_TABLET | Freq: Every day | ORAL | 6 refills | Status: AC

## 2024-01-01 MED ORDER — DENOSUMAB 120 MG/1.7ML ~~LOC~~ SOLN
120.0000 mg | Freq: Once | SUBCUTANEOUS | Status: AC
  Administered 2024-01-01: 120 mg via SUBCUTANEOUS
  Filled 2024-01-01: qty 1.7

## 2024-01-01 MED ORDER — SODIUM CHLORIDE 0.9 % IV SOLN: Freq: Once | INTRAVENOUS | Status: DC

## 2024-01-01 MED ORDER — GABAPENTIN 300 MG PO CAPS: 300.0000 mg | ORAL_CAPSULE | Freq: Three times a day (TID) | ORAL | 4 refills | Status: DC

## 2024-01-01 MED ORDER — SODIUM CHLORIDE 0.9 % IV SOLN
Freq: Once | INTRAVENOUS | Status: AC
Start: 2024-01-01 — End: 2024-01-01

## 2024-01-01 NOTE — Progress Notes (Signed)
 [facility] CSW Progress Note  Clinical Child psychotherapist met with patient to discuss Patent examiner.  Patient stated he did not wish to discuss today, but was open to taking the paperwork to review.  Patient's wife was also present.  They stated they have two adult sons who are special needs and a daughter.  They are both recovering from pneumonia and stated they have no needs at this time.    Dorothey Baseman, LCSW Clinical Social Worker [facility]

## 2024-01-01 NOTE — Patient Instructions (Signed)
Dehydration, Adult Dehydration is a condition in which there is not enough water or other fluids in the body. This happens when a person loses more fluids than they take in. Important organs cannot work right without the right amount of fluids. Any loss of fluids from the body can cause dehydration. Dehydration can be mild, worse, or very bad. It should be treated right away to keep it from getting very bad. What are the causes? Conditions that cause loss of water in the body. They include: Watery poop (diarrhea). Vomiting. Sweating a lot. Fever. Infection. Peeing (urinating) a lot. Not drinking enough fluids. Certain medicines, such as medicines that take extra fluid out of the body (diuretics). Lack of safe drinking water. Not being able to get enough water and food. What increases the risk? Having a long-term (chronic) illness that has not been treated the right way, such as: Diabetes. Heart disease. Kidney disease. Being 49 years of age or older. Having a disability. Living in a place that is high above the ground or sea (high in altitude). The thinner, drier air causes more fluid loss. Doing exercises that put stress on your body for a long time. Being active when in hot places. What are the signs or symptoms? Symptoms of dehydration depend on how bad it is. Mild or worse dehydration Thirst. Dry lips or dry mouth. Feeling dizzy or light-headed. Muscle cramps. Passing little pee or dark pee. Pee may be the color of tea. Headache. Very bad dehydration Changes in skin. Skin may: Be cold to the touch (clammy). Be blotchy or pale. Not go back to normal right after you pinch it and let it go. Little or no tears, pee, or sweat. Fast breathing. Low blood pressure. Weak pulse. Pulse that is more than 100 beats a minute when you are sitting still. Other changes, such as: Feeling very thirsty. Eyes that look hollow (sunken). Cold hands and feet. Being confused. Being very  tired (lethargic) or having trouble waking from sleep. Losing weight. Loss of consciousness. How is this treated? Treatment for this condition depends on how bad your dehydration is. Treatment should start right away. Do not wait until your condition gets very bad. Very bad dehydration is an emergency. You will need to go to a hospital. Mild or worse dehydration can be treated at home. You may be asked to: Drink more fluids. Drink an oral rehydration solution (ORS). This drink gives you the right amount of fluids, salts, and minerals (electrolytes). Very bad dehydration can be treated: With fluids through an IV tube. By correcting low levels of electrolytes in the body. By treating the problem that caused your dehydration. Follow these instructions at home: Oral rehydration solution If told by your doctor, drink an ORS: Make an ORS. Use instructions on the package. Start by drinking small amounts, about  cup (120 mL) every 5-10 minutes. Slowly drink more until you have had the amount that your doctor said to have.  Eating and drinking  Drink enough clear fluid to keep your pee pale yellow. If you were told to drink an ORS, finish the ORS first. Then, start slowly drinking other clear fluids. Drink fluids such as: Water. Do not drink only water. Doing that can make the salt (sodium) level in your body get too low. Water from ice chips you suck on. Fruit juice that you have added water to (diluted). Low-calorie sports drinks. Eat foods that have the right amounts of salts and minerals, such as bananas, oranges, potatoes,  tomatoes, or spinach. Do not drink alcohol. Avoid drinks that have caffeine or sugar. These include:: High-calorie sports drinks. Fruit juice that you did not add water to. Soda. Coffee or energy drinks. Avoid foods that are greasy or have a lot of fat or sugar. General instructions Take over-the-counter and prescription medicines only as told by your doctor. Do  not take sodium tablets. Doing that can make the salt level in your body get too high. Return to your normal activities as told by your doctor. Ask your doctor what activities are safe for you. Keep all follow-up visits. Your doctor may check and change your treatment. Contact a doctor if: You have pain in your belly (abdomen) and the pain: Gets worse. Stays in one place. You have a rash. You have a stiff neck. You get angry or annoyed more easily than normal. You are more tired or have a harder time waking than normal. You feel weak or dizzy. You feel very thirsty. Get help right away if: You have any symptoms of very bad dehydration. You vomit every time you eat or drink. Your vomiting gets worse, does not go away, or you vomit blood or green stuff. You are getting treatment, but symptoms are getting worse. You have a fever. You have a very bad headache. You have: Diarrhea that gets worse or does not go away. Blood in your poop (stool). This may cause poop to look black and tarry. No pee in 6-8 hours. Only a small amount of pee in 6-8 hours, and the pee is very dark. You have trouble breathing. These symptoms may be an emergency. Get help right away. Call 911. Do not wait to see if the symptoms will go away. Do not drive yourself to the hospital. This information is not intended to replace advice given to you by your health care provider. Make sure you discuss any questions you have with your health care provider. Document Revised: 04/09/2022 Document Reviewed: 04/09/2022 Elsevier Patient Education  2024 Elsevier Inc. Denosumab Injection (Oncology) What is this medication? DENOSUMAB (den oh SUE mab) prevents weakened bones caused by cancer. It may also be used to treat noncancerous bone tumors that cannot be removed by surgery. It can also be used to treat high calcium levels in the blood caused by cancer. It works by blocking a protein that causes bones to break down quickly.  This slows down the release of calcium from bones, which lowers calcium levels in your blood. It also makes your bones stronger and less likely to break (fracture). This medicine may be used for other purposes; ask your health care provider or pharmacist if you have questions. COMMON BRAND NAME(S): XGEVA What should I tell my care team before I take this medication? They need to know if you have any of these conditions: Dental disease Having surgery or tooth extraction Infection Kidney disease Low levels of calcium or vitamin D in the blood Malnutrition On hemodialysis Skin conditions or sensitivity Thyroid or parathyroid disease An unusual reaction to denosumab, other medications, foods, dyes, or preservatives Pregnant or trying to get pregnant Breast-feeding How should I use this medication? This medication is for injection under the skin. It is given by your care team in a hospital or clinic setting. A special MedGuide will be given to you before each treatment. Be sure to read this information carefully each time. Talk to your care team about the use of this medication in children. While it may be prescribed for children as young  as 13 years for selected conditions, precautions do apply. Overdosage: If you think you have taken too much of this medicine contact a poison control center or emergency room at once. NOTE: This medicine is only for you. Do not share this medicine with others. What if I miss a dose? Keep appointments for follow-up doses. It is important not to miss your dose. Call your care team if you are unable to keep an appointment. What may interact with this medication? Do not take this medication with any of the following: Other medications containing denosumab This medication may also interact with the following: Medications that lower your chance of fighting infection Steroid medications, such as prednisone or cortisone This list may not describe all possible  interactions. Give your health care provider a list of all the medicines, herbs, non-prescription drugs, or dietary supplements you use. Also tell them if you smoke, drink alcohol, or use illegal drugs. Some items may interact with your medicine. What should I watch for while using this medication? Your condition will be monitored carefully while you are receiving this medication. You may need blood work while taking this medication. This medication may increase your risk of getting an infection. Call your care team for advice if you get a fever, chills, sore throat, or other symptoms of a cold or flu. Do not treat yourself. Try to avoid being around people who are sick. You should make sure you get enough calcium and vitamin D while you are taking this medication, unless your care team tells you not to. Discuss the foods you eat and the vitamins you take with your care team. Some people who take this medication have severe bone, joint, or muscle pain. This medication may also increase your risk for jaw problems or a broken thigh bone. Tell your care team right away if you have severe pain in your jaw, bones, joints, or muscles. Tell your care team if you have any pain that does not go away or that gets worse. Talk to your care team if you may be pregnant. Serious birth defects can occur if you take this medication during pregnancy and for 5 months after the last dose. You will need a negative pregnancy test before starting this medication. Contraception is recommended while taking this medication and for 5 months after the last dose. Your care team can help you find the option that works for you. What side effects may I notice from receiving this medication? Side effects that you should report to your care team as soon as possible: Allergic reactions--skin rash, itching, hives, swelling of the face, lips, tongue, or throat Bone, joint, or muscle pain Low calcium level--muscle pain or cramps, confusion,  tingling, or numbness in the hands or feet Osteonecrosis of the jaw--pain, swelling, or redness in the mouth, numbness of the jaw, poor healing after dental work, unusual discharge from the mouth, visible bones in the mouth Side effects that usually do not require medical attention (report to your care team if they continue or are bothersome): Cough Diarrhea Fatigue Headache Nausea This list may not describe all possible side effects. Call your doctor for medical advice about side effects. You may report side effects to FDA at 1-800-FDA-1088. Where should I keep my medication? This medication is given in a hospital or clinic. It will not be stored at home. NOTE: This sheet is a summary. It may not cover all possible information. If you have questions about this medicine, talk to your doctor, pharmacist, or  health care provider.  2024 Elsevier/Gold Standard (2022-01-31 00:00:00)

## 2024-01-01 NOTE — Progress Notes (Signed)
 Treatment 6 weeks Hematology and Oncology Follow Up Visit  Shawn Bauer [facility] [id number] Mar 21, 1934 89 y.o. 01/01/2024   Principle Diagnosis:  IgG Kappa myeloma -- high risk due to t(4:21)   Current Therapy:        Velcade/Decadron -- started 10/09/2019, s/p cycle 34 - treatment frequency changed to every other week on 01/21/2021 now s/p cycle 20 --HOLD TREATMENT STARTING 07/29/2023 Xgeva 120 mg IM q 3 months -- next dose on 03/2024       Interim History:  Shawn Bauer is here today for follow-up.  He is having some more issues now.  He had pneumonia a couple weeks ago.  He got this from his wife.  He did not take any antibiotics for this.  He says he is feeling a lot better now..  Started develop neuropathy.  I am not sure why he has a neuropathy.  No he does have the myeloma.  He has had no treatment for the myeloma now for 5 months.  So far, his myeloma studies have been holding pretty steady.  When we last saw him, there was no monoclonal spike in his blood.  His IgG level was 808 mg/dL.  His Kappa light chain was 14 mg/dL.  I really hate that he is having the problems with his neuropathy.  I will put him on some gabapentin.  We will try for 300 mg 3 times a day.  He is eating okay.  His weight is down a little bit.  There is no bowel or bladder issues.  He has had no rashes.  He has had no bleeding.  Overall, I would say that his performance status probably ECOG 2.   Wt Readings from Last 3 Encounters:  01/01/24 128 lb 12 oz (58.4 kg)  12/30/23 128 lb (58.1 kg)  12/16/23 126 lb 6.4 oz (57.3 kg)      Medications:  Allergies as of 01/01/2024   No Known Allergies      Medication List        Accurate as of January 01, 2024  9:32 AM. If you have any questions, ask your nurse or doctor.          amiodarone 200 MG tablet Commonly known as: PACERONE Take 1 tablet (200 mg total) by mouth as directed. Take 200mg  daily Monday - Friday only. - Pt needs to keep upcoming appt in Feb to  avoid any missed doses   atorvastatin 40 MG tablet Commonly known as: LIPITOR TAKE 1 TABLET BY MOUTH EVERY DAY   Breo Ellipta 200-25 MCG/ACT Aepb Generic drug: fluticasone furoate-vilanterol 1 puff daily.   CINNAMON PO Take 1,000 mg by mouth daily.   clopidogrel 75 MG tablet Commonly known as: PLAVIX TAKE 1 TABLET BY MOUTH EVERY DAY WITH BREAKFAST   cyanocobalamin 1000 MCG tablet Take 1,000 mcg by mouth daily.   fluticasone-salmeterol 250-50 MCG/ACT Aepb Commonly known as: ADVAIR Inhale 1 puff into the lungs in the morning and at bedtime.   metoprolol succinate 50 MG 24 hr tablet Commonly known as: TOPROL-XL Take 25 mg by mouth daily.   nitroGLYCERIN 0.4 MG SL tablet Commonly known as: NITROSTAT Place 1 tablet (0.4 mg total) under the tongue every 5 (five) minutes as needed for chest pain.   olmesartan 40 MG tablet Commonly known as: BENICAR Take 40 mg by mouth in the morning.   polyethylene glycol 17 g packet Commonly known as: MIRALAX / GLYCOLAX Take 17 g by mouth daily.   promethazine 12.5  MG tablet Commonly known as: PHENERGAN Take 1 tablet (12.5 mg total) by mouth every 8 (eight) hours as needed for nausea or vomiting.   traMADol 50 MG tablet Commonly known as: ULTRAM Take 50 mg by mouth 2 (two) times daily as needed (for pain).   traZODone 50 MG tablet Commonly known as: DESYREL Take 25-50 mg by mouth at bedtime.        Allergies: No Known Allergies  Past Medical History, Surgical history, Social history, and Family History were reviewed and updated.  Review of Systems:  Review of Systems  Constitutional: Negative.   HENT: Negative.    Eyes: Negative.   Respiratory: Negative.    Cardiovascular: Negative.   Gastrointestinal: Negative.   Genitourinary: Negative.   Musculoskeletal: Negative.   Skin: Negative.   Neurological: Negative.   Endo/Heme/Allergies: Negative.   Psychiatric/Behavioral: Negative.       Physical Exam:  height is  5\' 10"  (1.778 m) and weight is 128 lb 12 oz (58.4 kg). His oral temperature is 98.1 F (36.7 C). His blood pressure is 145/59 (abnormal) and his pulse is 86. His respiration is 18 and oxygen saturation is 96%.   Wt Readings from Last 3 Encounters:  01/01/24 128 lb 12 oz (58.4 kg)  12/30/23 128 lb (58.1 kg)  12/16/23 126 lb 6.4 oz (57.3 kg)   Physical Exam Vitals reviewed.  HENT:     Head: Normocephalic and atraumatic.  Eyes:     Pupils: Pupils are equal, round, and reactive to light.  Cardiovascular:     Rate and Rhythm: Normal rate and regular rhythm.     Heart sounds: Normal heart sounds.  Pulmonary:     Effort: Pulmonary effort is normal.     Breath sounds: Normal breath sounds.  Abdominal:     General: Bowel sounds are normal.     Palpations: Abdomen is soft.  Musculoskeletal:        General: No tenderness or deformity. Normal range of motion.     Cervical back: Normal range of motion.  Lymphadenopathy:     Cervical: No cervical adenopathy.  Skin:    General: Skin is warm and dry.     Findings: No erythema or rash.  Neurological:     Mental Status: He is alert and oriented to person, place, and time.  Psychiatric:        Behavior: Behavior normal.        Thought Content: Thought content normal.        Judgment: Judgment normal.     Lab Results  Component Value Date   WBC 10.3 01/01/2024   HGB 11.8 (L) 01/01/2024   HCT 36.9 (L) 01/01/2024   MCV 100.8 (H) 01/01/2024   PLT 267 01/01/2024   Lab Results  Component Value Date   FERRITIN 11 (L) 04/12/2022   IRON 36 (L) 04/12/2022   TIBC 330 04/12/2022   UIBC 294 04/12/2022   IRONPCTSAT 11 (L) 04/12/2022   Lab Results  Component Value Date   RETICCTPCT 1.9 04/12/2022   RBC 3.66 (L) 01/01/2024   Lab Results  Component Value Date   KPAFRELGTCHN 139.8 (H) 11/28/2023   LAMBDASER 9.2 11/28/2023   KAPLAMBRATIO 15.20 (H) 11/28/2023   Lab Results  Component Value Date   IGGSERUM 808 11/28/2023   IGA 142  11/28/2023   IGMSERUM 52 11/28/2023   Lab Results  Component Value Date   TOTALPROTELP 5.6 (L) 11/28/2023   ALBUMINELP 3.2 10/17/2023   A1GS 0.3 10/17/2023  A2GS 0.7 10/17/2023   BETS 0.8 10/17/2023   GAMS 0.7 10/17/2023   MSPIKE Not Observed 10/17/2023   SPEI Comment 06/11/2023     Chemistry      Component Value Date/Time   NA 141 11/28/2023 0952   K 4.1 11/28/2023 0952   CL 106 11/28/2023 0952   CO2 30 11/28/2023 0952   BUN 21 11/28/2023 0952   CREATININE 1.51 (H) 11/28/2023 0952      Component Value Date/Time   CALCIUM 9.1 11/28/2023 0952   ALKPHOS 74 11/28/2023 0952   AST 14 (L) 11/28/2023 0952   ALT 11 11/28/2023 0952   BILITOT 0.7 11/28/2023 0952      Impression and Plan: Shawn Bauer is a very pleasant 89 yo caucasian gentleman with IgG kappa myeloma.    I want to make sure that we focus on his quality of life.  I think this is really the primary goal at this point.  His quality of life includes being affected by the neuropathy.  Hopefully, the gabapentin will help.  Also will put him on some vitamin B6.  We will have to see what his myeloma levels look like.  Again I would just want him to feel better.  I will give him some IV fluid today.  I think he is a little dehydrated.   Shawn Macho, MD 4/9/20259:32 AM

## 2024-01-02 LAB — KAPPA/LAMBDA LIGHT CHAINS
Kappa free light chain: 153.9 mg/L — ABNORMAL HIGH (ref 3.3–19.4)
Kappa, lambda light chain ratio: 8.69 — ABNORMAL HIGH (ref 0.26–1.65)
Lambda free light chains: 17.7 mg/L (ref 5.7–26.3)

## 2024-01-05 LAB — MULTIPLE MYELOMA PANEL, SERUM
Albumin SerPl Elph-Mcnc: 2.8 g/dL — ABNORMAL LOW (ref 2.9–4.4)
Albumin/Glob SerPl: 1 (ref 0.7–1.7)
Alpha 1: 0.3 g/dL (ref 0.0–0.4)
Alpha2 Glob SerPl Elph-Mcnc: 1 g/dL (ref 0.4–1.0)
B-Globulin SerPl Elph-Mcnc: 0.8 g/dL (ref 0.7–1.3)
Gamma Glob SerPl Elph-Mcnc: 1 g/dL (ref 0.4–1.8)
Globulin, Total: 3.1 g/dL (ref 2.2–3.9)
IgA: 193 mg/dL (ref 61–437)
IgG (Immunoglobin G), Serum: 1052 mg/dL (ref 603–1613)
IgM (Immunoglobulin M), Srm: 62 mg/dL (ref 15–143)
Total Protein ELP: 5.9 g/dL — ABNORMAL LOW (ref 6.0–8.5)

## 2024-01-08 ENCOUNTER — Other Ambulatory Visit: Payer: Self-pay | Admitting: Cardiovascular Disease

## 2024-01-15 ENCOUNTER — Telehealth: Payer: Self-pay | Admitting: Dietician

## 2024-01-15 NOTE — Telephone Encounter (Signed)
 Patient screened on MST. First attempt to reach. Provided my cell# on voice mail to return call to set up a nutrition consult. Couldn't send info in a text to mobile as it is a land line.  Carleen Chary, RDN, LDN Registered Dietitian, [facility] Part Time Remote (Usual office hours: Tuesday-Thursday) Cell: [phone]

## 2024-01-28 ENCOUNTER — Inpatient Hospital Stay: Attending: Family

## 2024-01-28 ENCOUNTER — Inpatient Hospital Stay

## 2024-01-28 ENCOUNTER — Inpatient Hospital Stay (HOSPITAL_BASED_OUTPATIENT_CLINIC_OR_DEPARTMENT_OTHER): Admitting: Hematology & Oncology

## 2024-01-28 ENCOUNTER — Other Ambulatory Visit: Payer: Self-pay

## 2024-01-28 ENCOUNTER — Encounter: Payer: Self-pay | Admitting: Hematology & Oncology

## 2024-01-28 DIAGNOSIS — C9000 Multiple myeloma not having achieved remission: Secondary | ICD-10-CM | POA: Diagnosis not present

## 2024-01-28 DIAGNOSIS — N183 Chronic kidney disease, stage 3 unspecified: Secondary | ICD-10-CM

## 2024-01-28 DIAGNOSIS — G629 Polyneuropathy, unspecified: Secondary | ICD-10-CM | POA: Insufficient documentation

## 2024-01-28 DIAGNOSIS — Z7189 Other specified counseling: Secondary | ICD-10-CM

## 2024-01-28 LAB — CBC WITH DIFFERENTIAL (CANCER CENTER ONLY)
Abs Immature Granulocytes: 0.03 10*3/uL (ref 0.00–0.07)
Basophils Absolute: 0.1 10*3/uL (ref 0.0–0.1)
Basophils Relative: 1 %
Eosinophils Absolute: 0.4 10*3/uL (ref 0.0–0.5)
Eosinophils Relative: 5 %
HCT: 34 % — ABNORMAL LOW (ref 39.0–52.0)
Hemoglobin: 10.8 g/dL — ABNORMAL LOW (ref 13.0–17.0)
Immature Granulocytes: 0 %
Lymphocytes Relative: 18 %
Lymphs Abs: 1.5 10*3/uL (ref 0.7–4.0)
MCH: 32 pg (ref 26.0–34.0)
MCHC: 31.8 g/dL (ref 30.0–36.0)
MCV: 100.6 fL — ABNORMAL HIGH (ref 80.0–100.0)
Monocytes Absolute: 1 10*3/uL (ref 0.1–1.0)
Monocytes Relative: 12 %
Neutro Abs: 5.4 10*3/uL (ref 1.7–7.7)
Neutrophils Relative %: 64 %
Platelet Count: 201 10*3/uL (ref 150–400)
RBC: 3.38 MIL/uL — ABNORMAL LOW (ref 4.22–5.81)
RDW: 14.5 % (ref 11.5–15.5)
WBC Count: 8.5 10*3/uL (ref 4.0–10.5)
nRBC: 0 % (ref 0.0–0.2)

## 2024-01-28 LAB — CMP (CANCER CENTER ONLY)
ALT: 18 U/L (ref 0–44)
AST: 20 U/L (ref 15–41)
Albumin: 3.8 g/dL (ref 3.5–5.0)
Alkaline Phosphatase: 57 U/L (ref 38–126)
Anion gap: 6 (ref 5–15)
BUN: 34 mg/dL — ABNORMAL HIGH (ref 8–23)
CO2: 29 mmol/L (ref 22–32)
Calcium: 9.1 mg/dL (ref 8.9–10.3)
Chloride: 103 mmol/L (ref 98–111)
Creatinine: 1.79 mg/dL — ABNORMAL HIGH (ref 0.61–1.24)
GFR, Estimated: 36 mL/min — ABNORMAL LOW (ref >60)
Glucose, Bld: 149 mg/dL — ABNORMAL HIGH (ref 70–99)
Potassium: 4.5 mmol/L (ref 3.5–5.1)
Sodium: 138 mmol/L (ref 135–145)
Total Bilirubin: 0.5 mg/dL (ref 0.0–1.2)
Total Protein: 6.5 g/dL (ref 6.5–8.1)

## 2024-01-28 LAB — VITAMIN B12: Vitamin B-12: 888 pg/mL (ref 180–914)

## 2024-01-28 LAB — LACTATE DEHYDROGENASE: LDH: 202 U/L — ABNORMAL HIGH (ref 98–192)

## 2024-01-28 NOTE — Progress Notes (Unsigned)
 Treatment 6 weeks Hematology and Oncology Follow Up Visit  Ulysis Newlen [facility] [id number] 1934-07-31 89 y.o. 01/28/2024   Principle Diagnosis:  IgG Kappa myeloma -- high risk due to t(4:21)   Current Therapy:        Velcade /Decadron  -- started 10/09/2019, s/p cycle 34 - treatment frequency changed to every other week on 01/21/2021 now s/p cycle 20 --HOLD TREATMENT STARTING 07/29/2023 Xgeva  120 mg IM q 3 months -- next dose on 03/2024       Interim History:  Mr. Yonamine is here today for follow-up.  His main problem has been neuropathy.  I think that we should probably try to increase the gabapentin  up to 400 mg 4 times daily.  Maybe, this will help with the neuropathy.  Is myeloma is doing quite nicely.  When we saw him today, his IgG level was 1100 mg/dL.  The Kappa light chain was 12.7 mg/dL.  There was no monoclonal spike in his blood.  He is eating okay.  He has had no problems with nausea or vomiting.  He has had no problems with bowels or bladder.  He has had no rashes.  He does have a lesion I think on the left zygomatic area.  He probably needs to see Dermatology for this.  Of course, he has no dermatologist.  We will have to make a referral for him.  He has had no mouth sores.  He has had no headache.  Overall, I would say his performance status is probably ECOG 1.    Wt Readings from Last 3 Encounters:  01/01/24 128 lb 12 oz (58.4 kg)  12/30/23 128 lb (58.1 kg)  12/16/23 126 lb 6.4 oz (57.3 kg)      Medications:  Allergies as of 01/28/2024   No Known Allergies      Medication List        Accurate as of Jan 28, 2024 10:44 AM. If you have any questions, ask your nurse or doctor.          STOP taking these medications    fluticasone-salmeterol 250-50 MCG/ACT Aepb Commonly known as: ADVAIR Stopped by: Ivor Mars       TAKE these medications    amiodarone  200 MG tablet Commonly known as: PACERONE  Take 1 tablet (200 mg total) by mouth as directed. Take  200mg  daily Monday - Friday only.   atorvastatin  40 MG tablet Commonly known as: LIPITOR TAKE 1 TABLET BY MOUTH EVERY DAY   Breo Ellipta 200-25 MCG/ACT Aepb Generic drug: fluticasone furoate-vilanterol 1 puff daily.   CINNAMON PO Take 1,000 mg by mouth daily.   clopidogrel  75 MG tablet Commonly known as: PLAVIX  TAKE 1 TABLET BY MOUTH EVERY DAY WITH BREAKFAST   cyanocobalamin  1000 MCG tablet Take 1,000 mcg by mouth daily.   gabapentin  300 MG capsule Commonly known as: NEURONTIN  Take 1 capsule (300 mg total) by mouth 3 (three) times daily.   metoprolol  succinate 50 MG 24 hr tablet Commonly known as: TOPROL -XL Take 25 mg by mouth daily.   nitroGLYCERIN  0.4 MG SL tablet Commonly known as: NITROSTAT  Place 1 tablet (0.4 mg total) under the tongue every 5 (five) minutes as needed for chest pain.   olmesartan 40 MG tablet Commonly known as: BENICAR Take 40 mg by mouth in the morning.   polyethylene glycol 17 g packet Commonly known as: MIRALAX / GLYCOLAX Take 17 g by mouth daily.   promethazine  12.5 MG tablet Commonly known as: PHENERGAN  Take 1 tablet (12.5  mg total) by mouth every 8 (eight) hours as needed for nausea or vomiting.   traMADol  50 MG tablet Commonly known as: ULTRAM  Take 50 mg by mouth 2 (two) times daily as needed (for pain).   traZODone 50 MG tablet Commonly known as: DESYREL Take 25-50 mg by mouth at bedtime.   Vitamin B6 250 MG Tabs Take 2 tablets (500 mg total) by mouth daily after breakfast.        Allergies: No Known Allergies  Past Medical History, Surgical history, Social history, and Family History were reviewed and updated.  Review of Systems:  Review of Systems  Constitutional: Negative.   HENT: Negative.    Eyes: Negative.   Respiratory: Negative.    Cardiovascular: Negative.   Gastrointestinal: Negative.   Genitourinary: Negative.   Musculoskeletal: Negative.   Skin: Negative.   Neurological: Negative.    Endo/Heme/Allergies: Negative.   Psychiatric/Behavioral: Negative.       Physical Exam:  Vital signs are temperature 98.1.  Pulse 86.  Blood pressure 145/59.  Weight is 128 pounds.  Wt Readings from Last 3 Encounters:  01/01/24 128 lb 12 oz (58.4 kg)  12/30/23 128 lb (58.1 kg)  12/16/23 126 lb 6.4 oz (57.3 kg)   Physical Exam Vitals reviewed.  HENT:     Head: Normocephalic and atraumatic.  Eyes:     Pupils: Pupils are equal, round, and reactive to light.  Cardiovascular:     Rate and Rhythm: Normal rate and regular rhythm.     Heart sounds: Normal heart sounds.  Pulmonary:     Effort: Pulmonary effort is normal.     Breath sounds: Normal breath sounds.  Abdominal:     General: Bowel sounds are normal.     Palpations: Abdomen is soft.  Musculoskeletal:        General: No tenderness or deformity. Normal range of motion.     Cervical back: Normal range of motion.  Lymphadenopathy:     Cervical: No cervical adenopathy.  Skin:    General: Skin is warm and dry.     Findings: No erythema or rash.     Comments: Looks like he may have a actinic keratosis or possibly squamous cell carcinoma on the left zygomatic area.  It there is no blood.  It is somewhat scaly.  It probably measures about 3-4 mm.  Neurological:     Mental Status: He is alert and oriented to person, place, and time.  Psychiatric:        Behavior: Behavior normal.        Thought Content: Thought content normal.        Judgment: Judgment normal.      Lab Results  Component Value Date   WBC 8.5 01/28/2024   HGB 10.8 (L) 01/28/2024   HCT 34.0 (L) 01/28/2024   MCV 100.6 (H) 01/28/2024   PLT 201 01/28/2024   Lab Results  Component Value Date   FERRITIN 11 (L) 04/12/2022   IRON  36 (L) 04/12/2022   TIBC 330 04/12/2022   UIBC 294 04/12/2022   IRONPCTSAT 11 (L) 04/12/2022   Lab Results  Component Value Date   RETICCTPCT 1.9 04/12/2022   RBC 3.38 (L) 01/28/2024   Lab Results  Component Value Date    KPAFRELGTCHN 153.9 (H) 01/01/2024   LAMBDASER 17.7 01/01/2024   KAPLAMBRATIO 8.69 (H) 01/01/2024   Lab Results  Component Value Date   IGGSERUM 1,052 01/01/2024   IGA 193 01/01/2024   IGMSERUM 62 01/01/2024  Lab Results  Component Value Date   TOTALPROTELP 5.9 (L) 01/01/2024   ALBUMINELP 3.2 10/17/2023   A1GS 0.3 10/17/2023   A2GS 0.7 10/17/2023   BETS 0.8 10/17/2023   GAMS 0.7 10/17/2023   MSPIKE Not Observed 10/17/2023   SPEI Comment 06/11/2023     Chemistry      Component Value Date/Time   NA 138 01/28/2024 0904   K 4.5 01/28/2024 0904   CL 103 01/28/2024 0904   CO2 29 01/28/2024 0904   BUN 34 (H) 01/28/2024 0904   CREATININE 1.79 (H) 01/28/2024 0904      Component Value Date/Time   CALCIUM  9.1 01/28/2024 0904   ALKPHOS 57 01/28/2024 0904   AST 20 01/28/2024 0904   ALT 18 01/28/2024 0904   BILITOT 0.5 01/28/2024 0904      Impression and Plan: Mr. Kauer is a very pleasant 89 yo caucasian gentleman with IgG kappa myeloma.  He has been off treatment now for about 6 months.  He has done incredibly well.  I hope the neuropathy does improve.  I told him to try increasing the gabapentin .  Maybe, this will help.  Also, he will be on vitamin B6.  I will plan to get him back to see me in another 5 weeks or so.  I can we want to follow-up mostly uses for the neuropathy.  We will see about getting him to Dermatology for this lesion on the left temporal area.     Ivor Mars, MD 5/6/202510:44 AM

## 2024-01-29 LAB — PROTEIN ELECTROPHORESIS, SERUM, WITH REFLEX
A/G Ratio: 1 (ref 0.7–1.7)
Albumin ELP: 3 g/dL (ref 2.9–4.4)
Alpha-1-Globulin: 0.3 g/dL (ref 0.0–0.4)
Alpha-2-Globulin: 0.8 g/dL (ref 0.4–1.0)
Beta Globulin: 0.8 g/dL (ref 0.7–1.3)
Gamma Globulin: 1 g/dL (ref 0.4–1.8)
Globulin, Total: 2.9 g/dL (ref 2.2–3.9)
Total Protein ELP: 5.9 g/dL — ABNORMAL LOW (ref 6.0–8.5)

## 2024-01-29 LAB — IGG, IGA, IGM
IgA: 194 mg/dL (ref 61–437)
IgG (Immunoglobin G), Serum: 1103 mg/dL (ref 603–1613)
IgM (Immunoglobulin M), Srm: 62 mg/dL (ref 15–143)

## 2024-01-29 LAB — KAPPA/LAMBDA LIGHT CHAINS
Kappa free light chain: 126.9 mg/L — ABNORMAL HIGH (ref 3.3–19.4)
Kappa, lambda light chain ratio: 8.94 — ABNORMAL HIGH (ref 0.26–1.65)
Lambda free light chains: 14.2 mg/L (ref 5.7–26.3)

## 2024-01-30 ENCOUNTER — Encounter: Payer: Self-pay | Admitting: Hematology & Oncology

## 2024-01-30 MED ORDER — GABAPENTIN 400 MG PO CAPS: 400.0000 mg | ORAL_CAPSULE | Freq: Four times a day (QID) | ORAL | 4 refills | Status: DC

## 2024-02-19 ENCOUNTER — Telehealth: Payer: Self-pay | Admitting: Dietician

## 2024-02-19 ENCOUNTER — Inpatient Hospital Stay: Admitting: Dietician

## 2024-02-19 NOTE — Telephone Encounter (Signed)
 Patient screened on MST. Second attempt to reach. Provided my cell# on voice mail to return call to set up a nutrition consult. Couldn't send info in a text to mobile as it is a land line.  Carleen Chary, RDN, LDN Registered Dietitian,  [facility] Part [facility] Remote (Usual office hours: Tuesday-Thursday) Cell: [phone]

## 2024-02-19 NOTE — Progress Notes (Signed)
 Nutrition Assessment   Reason for Assessment: MST screen for weight loss.   Patient's wife returned message.  She reports no concerns with eating and reports he is drinking 3 regular chocolate Ensure each day, not drinking much other fluids, only one small cup of water.  Anthropometrics:   Height: 70" Weight: 128.8# UBW: losing since 2024 BMI: 18.47    NUTRITION DIAGNOSIS: Inadequate PO intake to meet increased nutrient needs, r/t cancer diagnosis  INTERVENTION:  Relayed that nutrition services are wrap around service provided at no charge and encouraged continued communication if experiencing continued weight loss or any nutritional impact symptoms (NIS). Educated on importance of adequate calorie and protein energy intake  with nutrient dense foods when possible to maintain weight/strength Encouraged additional snacking if able. Encouraged alternative for increasing fluids Suggested changing to Ensure plus oral nutrition supplement for more calories and protein, Suggested she ask for coupons at cancer center.  Contact information provided   Next Visit: PRN at patient or provider request  Carleen Chary, RDN, LDN Registered Dietitian, [facility] Part Time Remote (Usual office hours: Tuesday-Thursday) Cell: [phone]

## 2024-03-03 ENCOUNTER — Inpatient Hospital Stay

## 2024-03-03 ENCOUNTER — Ambulatory Visit: Admitting: Hematology & Oncology

## 2024-03-10 ENCOUNTER — Encounter: Payer: Self-pay | Admitting: Medical Oncology

## 2024-03-10 ENCOUNTER — Inpatient Hospital Stay: Attending: Family

## 2024-03-10 ENCOUNTER — Inpatient Hospital Stay (HOSPITAL_BASED_OUTPATIENT_CLINIC_OR_DEPARTMENT_OTHER): Admitting: Medical Oncology

## 2024-03-10 VITALS — BP 126/48 | HR 69 | Temp 98.2°F | Resp 20 | Ht 70.0 in | Wt 136.0 lb

## 2024-03-10 DIAGNOSIS — C9000 Multiple myeloma not having achieved remission: Secondary | ICD-10-CM | POA: Diagnosis present

## 2024-03-10 DIAGNOSIS — N183 Chronic kidney disease, stage 3 unspecified: Secondary | ICD-10-CM | POA: Diagnosis not present

## 2024-03-10 DIAGNOSIS — G629 Polyneuropathy, unspecified: Secondary | ICD-10-CM | POA: Insufficient documentation

## 2024-03-10 DIAGNOSIS — D631 Anemia in chronic kidney disease: Secondary | ICD-10-CM | POA: Diagnosis not present

## 2024-03-10 LAB — CMP (CANCER CENTER ONLY)
ALT: 25 U/L (ref 0–44)
AST: 20 U/L (ref 15–41)
Albumin: 3.3 g/dL — ABNORMAL LOW (ref 3.5–5.0)
Alkaline Phosphatase: 70 U/L (ref 38–126)
Anion gap: 7 (ref 5–15)
BUN: 32 mg/dL — ABNORMAL HIGH (ref 8–23)
CO2: 28 mmol/L (ref 22–32)
Calcium: 8.7 mg/dL — ABNORMAL LOW (ref 8.9–10.3)
Chloride: 107 mmol/L (ref 98–111)
Creatinine: 1.76 mg/dL — ABNORMAL HIGH (ref 0.61–1.24)
GFR, Estimated: 37 mL/min — ABNORMAL LOW (ref >60)
Glucose, Bld: 124 mg/dL — ABNORMAL HIGH (ref 70–99)
Potassium: 4.2 mmol/L (ref 3.5–5.1)
Sodium: 142 mmol/L (ref 135–145)
Total Bilirubin: 0.4 mg/dL (ref 0.0–1.2)
Total Protein: 5.9 g/dL — ABNORMAL LOW (ref 6.5–8.1)

## 2024-03-10 LAB — CBC WITH DIFFERENTIAL (CANCER CENTER ONLY)
Abs Immature Granulocytes: 0.04 10*3/uL (ref 0.00–0.07)
Basophils Absolute: 0.1 10*3/uL (ref 0.0–0.1)
Basophils Relative: 1 %
Eosinophils Absolute: 0.5 10*3/uL (ref 0.0–0.5)
Eosinophils Relative: 5 %
HCT: 32.6 % — ABNORMAL LOW (ref 39.0–52.0)
Hemoglobin: 10.1 g/dL — ABNORMAL LOW (ref 13.0–17.0)
Immature Granulocytes: 0 %
Lymphocytes Relative: 16 %
Lymphs Abs: 1.4 10*3/uL (ref 0.7–4.0)
MCH: 30.8 pg (ref 26.0–34.0)
MCHC: 31 g/dL (ref 30.0–36.0)
MCV: 99.4 fL (ref 80.0–100.0)
Monocytes Absolute: 1.1 10*3/uL — ABNORMAL HIGH (ref 0.1–1.0)
Monocytes Relative: 12 %
Neutro Abs: 6 10*3/uL (ref 1.7–7.7)
Neutrophils Relative %: 66 %
Platelet Count: 265 10*3/uL (ref 150–400)
RBC: 3.28 MIL/uL — ABNORMAL LOW (ref 4.22–5.81)
RDW: 13.4 % (ref 11.5–15.5)
WBC Count: 9.1 10*3/uL (ref 4.0–10.5)
nRBC: 0 % (ref 0.0–0.2)

## 2024-03-10 LAB — LACTATE DEHYDROGENASE: LDH: 184 U/L (ref 98–192)

## 2024-03-10 NOTE — Progress Notes (Signed)
 Treatment 6 weeks Hematology and Oncology Follow Up Visit  Shawn Bauer [facility] [id number] 1934/08/18 89 y.o. 03/10/2024   Principle Diagnosis:  IgG Kappa myeloma -- high risk due to t(4:21)   Current Therapy:        Velcade /Decadron  -- started 10/09/2019, s/p cycle 34 - treatment frequency changed to every other week on 01/21/2021 now s/p cycle 20 --HOLD TREATMENT STARTING 07/29/2023 Xgeva  120 mg IM q 3 months -- next dose on 03/2024       Interim History:  Shawn Bauer is here today for follow-up.   His neuropathy is stable despite the recent increase in gabapentin  up to 400 mg 4 times daily.   His myeloma is staying pretty stable. When we last saw him, his IgG level was 1103 mg/dL.  The Kappa light chain was 12.7 mg/dL.  There was no monoclonal spike in his blood.  He is eating really well. His appetite has made a full recovery. He has had no problems with nausea or vomiting.  He has had no problems with bowels or bladder.  He has had no rashes.  Of note he has a lesion on his right forearm that he was referred to dermatology for. He reports that it is unchanged and has not yet made an appointment with dermatology- he will consider this.   He has had no mouth sores.  He has had no headache.  Overall, I would say his performance status is probably ECOG 1.   Wt Readings from Last 3 Encounters:  03/10/24 136 lb (61.7 kg)  01/01/24 128 lb 12 oz (58.4 kg)  12/30/23 128 lb (58.1 kg)      Medications:  Allergies as of 03/10/2024   No Known Allergies      Medication List        Accurate as of March 10, 2024 12:07 PM. If you have any questions, ask your nurse or doctor.          STOP taking these medications    traZODone 50 MG tablet Commonly known as: DESYREL Stopped by: Sharla Davis       TAKE these medications    amiodarone  200 MG tablet Commonly known as: PACERONE  Take 1 tablet (200 mg total) by mouth as directed. Take 200mg  daily Monday - Friday only.    atorvastatin  40 MG tablet Commonly known as: LIPITOR TAKE 1 TABLET BY MOUTH EVERY DAY   Breo Ellipta 200-25 MCG/ACT Aepb Generic drug: fluticasone furoate-vilanterol 1 puff daily.   CINNAMON PO Take 1,000 mg by mouth daily.   clopidogrel  75 MG tablet Commonly known as: PLAVIX  TAKE 1 TABLET BY MOUTH EVERY DAY WITH BREAKFAST   cyanocobalamin  1000 MCG tablet Take 1,000 mcg by mouth daily.   gabapentin  400 MG capsule Commonly known as: NEURONTIN  Take 1 capsule (400 mg total) by mouth 4 (four) times daily. Please take 1 capsule 3 times a day and also 1 at bedtime.   metoprolol  succinate 50 MG 24 hr tablet Commonly known as: TOPROL -XL Take 25 mg by mouth daily.   nitroGLYCERIN  0.4 MG SL tablet Commonly known as: NITROSTAT  Place 1 tablet (0.4 mg total) under the tongue every 5 (five) minutes as needed for chest pain.   olmesartan 40 MG tablet Commonly known as: BENICAR Take 40 mg by mouth in the morning.   polyethylene glycol 17 g packet Commonly known as: MIRALAX / GLYCOLAX Take 17 g by mouth daily.   promethazine  12.5 MG tablet Commonly known as: PHENERGAN  Take 1 tablet (  12.5 mg total) by mouth every 8 (eight) hours as needed for nausea or vomiting.   traMADol  50 MG tablet Commonly known as: ULTRAM  Take 50 mg by mouth 2 (two) times daily as needed (for pain).   Vitamin B6 250 MG Tabs Take 2 tablets (500 mg total) by mouth daily after breakfast.        Allergies: No Known Allergies  Past Medical History, Surgical history, Social history, and Family History were reviewed and updated.  Review of Systems:  Review of Systems  Constitutional: Negative.   HENT: Negative.    Eyes: Negative.   Respiratory: Negative.    Cardiovascular: Negative.   Gastrointestinal: Negative.   Genitourinary: Negative.   Musculoskeletal: Negative.   Skin: Negative.   Neurological: Negative.   Endo/Heme/Allergies: Negative.   Psychiatric/Behavioral: Negative.       Physical  Exam:  Vitals:   03/10/24 1148  BP: (!) 126/48  Pulse: 69  Resp: 20  Temp: 98.2 F (36.8 C)  SpO2: 98%     Wt Readings from Last 3 Encounters:  03/10/24 136 lb (61.7 kg)  01/01/24 128 lb 12 oz (58.4 kg)  12/30/23 128 lb (58.1 kg)   Physical Exam Vitals reviewed.  HENT:     Head: Normocephalic and atraumatic.   Eyes:     Pupils: Pupils are equal, round, and reactive to light.    Cardiovascular:     Rate and Rhythm: Normal rate and regular rhythm.     Heart sounds: Normal heart sounds.  Pulmonary:     Effort: Pulmonary effort is normal.     Breath sounds: Normal breath sounds.  Abdominal:     General: Bowel sounds are normal.     Palpations: Abdomen is soft.   Musculoskeletal:        General: No tenderness or deformity. Normal range of motion.     Cervical back: Normal range of motion.  Lymphadenopathy:     Cervical: No cervical adenopathy.   Skin:    General: Skin is warm and dry.     Findings: No erythema or rash.     Comments: There is a raised, scantly erythematic scaly round lesion of the left ventral forearm measuring roughly 1cm. No bleeding or discharge    Neurological:     Mental Status: He is alert and oriented to person, place, and time.   Psychiatric:        Behavior: Behavior normal.        Thought Content: Thought content normal.        Judgment: Judgment normal.      Lab Results  Component Value Date   WBC 9.1 03/10/2024   HGB 10.1 (L) 03/10/2024   HCT 32.6 (L) 03/10/2024   MCV 99.4 03/10/2024   PLT 265 03/10/2024   Lab Results  Component Value Date   FERRITIN 11 (L) 04/12/2022   IRON  36 (L) 04/12/2022   TIBC 330 04/12/2022   UIBC 294 04/12/2022   IRONPCTSAT 11 (L) 04/12/2022   Lab Results  Component Value Date   RETICCTPCT 1.9 04/12/2022   RBC 3.28 (L) 03/10/2024   Lab Results  Component Value Date   KPAFRELGTCHN 126.9 (H) 01/28/2024   LAMBDASER 14.2 01/28/2024   KAPLAMBRATIO 8.94 (H) 01/28/2024   Lab Results   Component Value Date   IGGSERUM 1,103 01/28/2024   IGA 194 01/28/2024   IGMSERUM 62 01/28/2024   Lab Results  Component Value Date   TOTALPROTELP 5.9 (L) 01/28/2024   ALBUMINELP 3.0  01/28/2024   A1GS 0.3 01/28/2024   A2GS 0.8 01/28/2024   BETS 0.8 01/28/2024   GAMS 1.0 01/28/2024   MSPIKE Not Observed 01/28/2024   SPEI Comment 06/11/2023     Chemistry      Component Value Date/Time   NA 142 03/10/2024 1116   K 4.2 03/10/2024 1116   CL 107 03/10/2024 1116   CO2 28 03/10/2024 1116   BUN 32 (H) 03/10/2024 1116   CREATININE 1.76 (H) 03/10/2024 1116      Component Value Date/Time   CALCIUM  8.7 (L) 03/10/2024 1116   ALKPHOS 70 03/10/2024 1116   AST 20 03/10/2024 1116   ALT 25 03/10/2024 1116   BILITOT 0.4 03/10/2024 1116     No diagnosis found.  Impression and Plan: Shawn Bauer is a very pleasant 89 yo caucasian gentleman with IgG kappa myeloma.  He has been off treatment now for about 7 months.  He has done incredibly well off treatment with significant improvement in his quality of life.   Neuropathy is stable. He will continue gabapentin  at current dose along with vitamin B6.  RTC 1 month MD, labs(CBC w/, CMP, MM panel, light chains, LDH), Xgeva  injection   Sharla Davis, PA-C 6/17/202512:07 PM

## 2024-03-11 LAB — PROTEIN ELECTROPHORESIS, SERUM, WITH REFLEX
A/G Ratio: 0.9 (ref 0.7–1.7)
Albumin ELP: 2.6 g/dL — ABNORMAL LOW (ref 2.9–4.4)
Alpha-1-Globulin: 0.3 g/dL (ref 0.0–0.4)
Alpha-2-Globulin: 1 g/dL (ref 0.4–1.0)
Beta Globulin: 0.7 g/dL (ref 0.7–1.3)
Gamma Globulin: 0.8 g/dL (ref 0.4–1.8)
Globulin, Total: 2.8 g/dL (ref 2.2–3.9)
Total Protein ELP: 5.4 g/dL — ABNORMAL LOW (ref 6.0–8.5)

## 2024-03-11 LAB — IGG, IGA, IGM
IgA: 170 mg/dL (ref 61–437)
IgG (Immunoglobin G), Serum: 900 mg/dL (ref 603–1613)
IgM (Immunoglobulin M), Srm: 46 mg/dL (ref 15–143)

## 2024-03-11 LAB — KAPPA/LAMBDA LIGHT CHAINS
Kappa free light chain: 167.6 mg/L — ABNORMAL HIGH (ref 3.3–19.4)
Kappa, lambda light chain ratio: 9.36 — ABNORMAL HIGH (ref 0.26–1.65)
Lambda free light chains: 17.9 mg/L (ref 5.7–26.3)

## 2024-03-27 ENCOUNTER — Other Ambulatory Visit: Payer: Self-pay | Admitting: Hematology & Oncology

## 2024-03-28 ENCOUNTER — Encounter: Payer: Self-pay | Admitting: Hematology & Oncology

## 2024-04-06 ENCOUNTER — Other Ambulatory Visit: Payer: Self-pay | Admitting: Medical Oncology

## 2024-04-06 DIAGNOSIS — D631 Anemia in chronic kidney disease: Secondary | ICD-10-CM

## 2024-04-06 DIAGNOSIS — C9000 Multiple myeloma not having achieved remission: Secondary | ICD-10-CM

## 2024-04-06 DIAGNOSIS — D7589 Other specified diseases of blood and blood-forming organs: Secondary | ICD-10-CM

## 2024-04-07 ENCOUNTER — Inpatient Hospital Stay (HOSPITAL_BASED_OUTPATIENT_CLINIC_OR_DEPARTMENT_OTHER): Admitting: Medical Oncology

## 2024-04-07 ENCOUNTER — Inpatient Hospital Stay: Attending: Family

## 2024-04-07 ENCOUNTER — Encounter: Payer: Self-pay | Admitting: Medical Oncology

## 2024-04-07 ENCOUNTER — Inpatient Hospital Stay

## 2024-04-07 VITALS — BP 128/55 | HR 73 | Temp 98.8°F | Resp 19 | Ht 70.0 in | Wt 135.8 lb

## 2024-04-07 DIAGNOSIS — N183 Chronic kidney disease, stage 3 unspecified: Secondary | ICD-10-CM

## 2024-04-07 DIAGNOSIS — D631 Anemia in chronic kidney disease: Secondary | ICD-10-CM | POA: Diagnosis not present

## 2024-04-07 DIAGNOSIS — D7589 Other specified diseases of blood and blood-forming organs: Secondary | ICD-10-CM | POA: Insufficient documentation

## 2024-04-07 DIAGNOSIS — C9000 Multiple myeloma not having achieved remission: Secondary | ICD-10-CM | POA: Insufficient documentation

## 2024-04-07 LAB — CBC WITH DIFFERENTIAL (CANCER CENTER ONLY)
Abs Immature Granulocytes: 0.1 K/uL — ABNORMAL HIGH (ref 0.00–0.07)
Basophils Absolute: 0.1 K/uL (ref 0.0–0.1)
Basophils Relative: 1 %
Eosinophils Absolute: 0.5 K/uL (ref 0.0–0.5)
Eosinophils Relative: 5 %
HCT: 32.8 % — ABNORMAL LOW (ref 39.0–52.0)
Hemoglobin: 10.1 g/dL — ABNORMAL LOW (ref 13.0–17.0)
Immature Granulocytes: 1 %
Lymphocytes Relative: 15 %
Lymphs Abs: 1.4 K/uL (ref 0.7–4.0)
MCH: 29.3 pg (ref 26.0–34.0)
MCHC: 30.8 g/dL (ref 30.0–36.0)
MCV: 95.1 fL (ref 80.0–100.0)
Monocytes Absolute: 0.9 K/uL (ref 0.1–1.0)
Monocytes Relative: 10 %
Neutro Abs: 6.3 K/uL (ref 1.7–7.7)
Neutrophils Relative %: 68 %
Platelet Count: 316 K/uL (ref 150–400)
RBC: 3.45 MIL/uL — ABNORMAL LOW (ref 4.22–5.81)
RDW: 13.8 % (ref 11.5–15.5)
WBC Count: 9.3 K/uL (ref 4.0–10.5)
nRBC: 0 % (ref 0.0–0.2)

## 2024-04-07 LAB — CMP (CANCER CENTER ONLY)
ALT: 20 U/L (ref 0–44)
AST: 21 U/L (ref 15–41)
Albumin: 3.1 g/dL — ABNORMAL LOW (ref 3.5–5.0)
Alkaline Phosphatase: 68 U/L (ref 38–126)
Anion gap: 7 (ref 5–15)
BUN: 20 mg/dL (ref 8–23)
CO2: 27 mmol/L (ref 22–32)
Calcium: 8.5 mg/dL — ABNORMAL LOW (ref 8.9–10.3)
Chloride: 108 mmol/L (ref 98–111)
Creatinine: 1.66 mg/dL — ABNORMAL HIGH (ref 0.61–1.24)
GFR, Estimated: 40 mL/min — ABNORMAL LOW (ref >60)
Glucose, Bld: 130 mg/dL — ABNORMAL HIGH (ref 70–99)
Potassium: 4 mmol/L (ref 3.5–5.1)
Sodium: 142 mmol/L (ref 135–145)
Total Bilirubin: 0.5 mg/dL (ref 0.0–1.2)
Total Protein: 5.8 g/dL — ABNORMAL LOW (ref 6.5–8.1)

## 2024-04-07 LAB — LACTATE DEHYDROGENASE: LDH: 201 U/L — ABNORMAL HIGH (ref 98–192)

## 2024-04-07 MED ORDER — DENOSUMAB 120 MG/1.7ML ~~LOC~~ SOLN
120.0000 mg | Freq: Once | SUBCUTANEOUS | Status: AC
  Administered 2024-04-07: 120 mg via SUBCUTANEOUS
  Filled 2024-04-07: qty 1.7

## 2024-04-07 NOTE — Progress Notes (Signed)
 Treatment 6 weeks Hematology and Oncology Follow Up Visit  Shawn Bauer [facility] [id number] [birth year] 89 y.o. 04/07/2024   Principle Diagnosis:  IgG Kappa myeloma -- high risk due to t(4:21)    Prior Treatment:  Velcade /Decadron  -- started 10/09/2019, s/p cycle 34 - treatment frequency changed to every other week on 01/21/2021 now s/p cycle 20 --HOLD TREATMENT STARTING 07/29/2023  Current Therapy:        Observation Xgeva  120 mg IM q 3 months -- next dose on 03/2024       Interim History:  Shawn Bauer is here today for follow-up.   Today he reports that he has been doing ok but has noticed physical deconditioning especially in the heat. He has had 4 falls recently when not using his cane so he has been trying to get better about using this and is thinking about using a walker. He denies any injury or hitting of his head during the fall events. He is meeting with his PCP next week to ask for a lift chair, rolling walker. He also has plans to purchase a leg exercise machine that he can use while seated to help regain strength.   When we last saw him, his IgG level was 900 mg/dL.  The Kappa light chain was 16.7 mg/dL.  There was no monoclonal spike in his blood.  He is eating really well. He has had no problems with nausea or vomiting.  He has had no problems with bowels or bladder.  He has had no rashes.  Of note he has a lesion on his right forearm that he was referred to dermatology for. He reports that it is unchanged and has not yet made an appointment with dermatology- he will consider this.   He has had no mouth sores.  He has had no headache.   Overall, I would say his performance status is probably ECOG 1.   Wt Readings from Last 3 Encounters:  04/07/24 135 lb 12.8 oz (61.6 kg)  03/10/24 136 lb (61.7 kg)  01/01/24 128 lb 12 oz (58.4 kg)   Medications:  Allergies as of 04/07/2024   No Known Allergies      Medication List        Accurate as of April 07, 2024 11:32 AM. If you  have any questions, ask your nurse or doctor.          amiodarone  200 MG tablet Commonly known as: PACERONE  Take 1 tablet (200 mg total) by mouth as directed. Take 200mg  daily Monday - Friday only.   atorvastatin  40 MG tablet Commonly known as: LIPITOR TAKE 1 TABLET BY MOUTH EVERY DAY   Breo Ellipta 200-25 MCG/ACT Aepb Generic drug: fluticasone furoate-vilanterol 1 puff daily.   CINNAMON PO Take 1,000 mg by mouth daily.   clopidogrel  75 MG tablet Commonly known as: PLAVIX  TAKE 1 TABLET BY MOUTH EVERY DAY WITH BREAKFAST   cyanocobalamin  1000 MCG tablet Take 1,000 mcg by mouth daily.   gabapentin  400 MG capsule Commonly known as: NEURONTIN  Take 1 capsule (400 mg total) by mouth 4 (four) times daily. Please take 1 capsule 3 times a day and also 1 at bedtime.   metoprolol  succinate 50 MG 24 hr tablet Commonly known as: TOPROL -XL Take 25 mg by mouth daily.   nitroGLYCERIN  0.4 MG SL tablet Commonly known as: NITROSTAT  Place 1 tablet (0.4 mg total) under the tongue every 5 (five) minutes as needed for chest pain.   olmesartan 40 MG tablet Commonly known as: [employer]  Take 40 mg by mouth in the morning.   polyethylene glycol 17 g packet Commonly known as: MIRALAX / GLYCOLAX Take 17 g by mouth daily.   promethazine  12.5 MG tablet Commonly known as: PHENERGAN  TAKE 1 TABLET (12.5 MG TOTAL) BY MOUTH EVERY 8 HOURS AS NEEDED FOR NAUSEA AND VOMITING   traMADol  50 MG tablet Commonly known as: ULTRAM  Take 50 mg by mouth 2 (two) times daily as needed (for pain).   traZODone 50 MG tablet Commonly known as: DESYREL Take 50 mg by mouth at bedtime.   Vitamin B6 250 MG Tabs Take 2 tablets (500 mg total) by mouth daily after breakfast.        Allergies: No Known Allergies  Past Medical History, Surgical history, Social history, and Family History were reviewed and updated.  Review of Systems:  Review of Systems  Constitutional: Negative.   HENT: Negative.    Eyes:  Negative.   Respiratory: Negative.    Cardiovascular: Negative.   Gastrointestinal: Negative.   Genitourinary: Negative.   Musculoskeletal: Negative.   Skin: Negative.   Neurological: Negative.   Endo/Heme/Allergies: Negative.   Psychiatric/Behavioral: Negative.       Physical Exam:  Vitals:   04/07/24 1104  BP: (!) 128/55  Pulse: 73  Resp: 19  Temp: 98.8 F (37.1 C)  SpO2: 100%     Wt Readings from Last 3 Encounters:  04/07/24 135 lb 12.8 oz (61.6 kg)  03/10/24 136 lb (61.7 kg)  01/01/24 128 lb 12 oz (58.4 kg)   Physical Exam Vitals reviewed.  HENT:     Head: Normocephalic and atraumatic.  Eyes:     Pupils: Pupils are equal, round, and reactive to light.  Cardiovascular:     Rate and Rhythm: Normal rate and regular rhythm.     Heart sounds: Normal heart sounds.  Pulmonary:     Effort: Pulmonary effort is normal.     Breath sounds: Normal breath sounds.  Abdominal:     General: Bowel sounds are normal.     Palpations: Abdomen is soft.  Musculoskeletal:        General: No tenderness or deformity. Normal range of motion.     Cervical back: Normal range of motion.  Lymphadenopathy:     Cervical: No cervical adenopathy.  Skin:    General: Skin is warm and dry.     Findings: No erythema or rash.     Comments: There is a raised, scantly erythematic scaly round lesion of the left ventral forearm measuring roughly 1cm. No bleeding or discharge   Neurological:     Mental Status: He is alert and oriented to person, place, and time.  Psychiatric:        Behavior: Behavior normal.        Thought Content: Thought content normal.        Judgment: Judgment normal.      Lab Results  Component Value Date   WBC 9.1 03/10/2024   HGB 10.1 (L) 03/10/2024   HCT 32.6 (L) 03/10/2024   MCV 99.4 03/10/2024   PLT 265 03/10/2024   Lab Results  Component Value Date   FERRITIN 11 (L) 04/12/2022   IRON  36 (L) 04/12/2022   TIBC 330 04/12/2022   UIBC 294 04/12/2022    IRONPCTSAT 11 (L) 04/12/2022   Lab Results  Component Value Date   RETICCTPCT 1.9 04/12/2022   RBC 3.28 (L) 03/10/2024   Lab Results  Component Value Date   KPAFRELGTCHN 167.6 (H) 03/10/2024  LAMBDASER 17.9 03/10/2024   KAPLAMBRATIO 9.36 (H) 03/10/2024   Lab Results  Component Value Date   IGGSERUM 900 03/10/2024   IGA 170 03/10/2024   IGMSERUM 46 03/10/2024   Lab Results  Component Value Date   TOTALPROTELP 5.4 (L) 03/10/2024   ALBUMINELP 2.6 (L) 03/10/2024   A1GS 0.3 03/10/2024   A2GS 1.0 03/10/2024   BETS 0.7 03/10/2024   GAMS 0.8 03/10/2024   MSPIKE Not Observed 03/10/2024   SPEI Comment 06/11/2023     Chemistry      Component Value Date/Time   NA 142 03/10/2024 1116   K 4.2 03/10/2024 1116   CL 107 03/10/2024 1116   CO2 28 03/10/2024 1116   BUN 32 (H) 03/10/2024 1116   CREATININE 1.76 (H) 03/10/2024 1116      Component Value Date/Time   CALCIUM  8.7 (L) 03/10/2024 1116   ALKPHOS 70 03/10/2024 1116   AST 20 03/10/2024 1116   ALT 25 03/10/2024 1116   BILITOT 0.4 03/10/2024 1116     Encounter Diagnoses  Name Primary?   Kappa light chain myeloma (HCC) Yes   Anemia of chronic kidney failure, stage 3 (moderate) (HCC)    Macrocytosis     Impression and Plan: Shawn Bauer is a very pleasant 89 yo caucasian gentleman with IgG kappa myeloma.  He has been off treatment now for about 7 months.  He has done incredibly well off treatment with significant improvement in his quality of life.   CBC today shows a Hgb of 10.1 which is stable.  Creatinine is stable at 1.66 Xgeva  today as long as calcium  is above 8.5  RTC 1 month MD, labs(CBC w/, CMP, MM panel, light chains, LDH)  Lauraine CHRISTELLA Dais, PA-C 7/15/202511:32 AM

## 2024-04-07 NOTE — Patient Instructions (Signed)
 Denosumab Injection (Oncology) What is this medication? DENOSUMAB (den oh SUE mab) prevents weakened bones caused by cancer. It may also be used to treat noncancerous bone tumors that cannot be removed by surgery. It can also be used to treat high calcium levels in the blood caused by cancer. It works by blocking a protein that causes bones to break down quickly. This slows down the release of calcium from bones, which lowers calcium levels in your blood. It also makes your bones stronger and less likely to break (fracture). This medicine may be used for other purposes; ask your health care provider or pharmacist if you have questions. COMMON BRAND NAME(S): XGEVA What should I tell my care team before I take this medication? They need to know if you have any of these conditions: Dental disease Having surgery or tooth extraction Infection Kidney disease Low levels of calcium or vitamin D in the blood Malnutrition On hemodialysis Skin conditions or sensitivity Thyroid or parathyroid disease An unusual reaction to denosumab, other medications, foods, dyes, or preservatives Pregnant or trying to get pregnant Breast-feeding How should I use this medication? This medication is for injection under the skin. It is given by your care team in a hospital or clinic setting. A special MedGuide will be given to you before each treatment. Be sure to read this information carefully each time. Talk to your care team about the use of this medication in children. While it may be prescribed for children as young as 13 years for selected conditions, precautions do apply. Overdosage: If you think you have taken too much of this medicine contact a poison control center or emergency room at once. NOTE: This medicine is only for you. Do not share this medicine with others. What if I miss a dose? Keep appointments for follow-up doses. It is important not to miss your dose. Call your care team if you are unable to  keep an appointment. What may interact with this medication? Do not take this medication with any of the following: Other medications containing denosumab This medication may also interact with the following: Medications that lower your chance of fighting infection Steroid medications, such as prednisone or cortisone This list may not describe all possible interactions. Give your health care provider a list of all the medicines, herbs, non-prescription drugs, or dietary supplements you use. Also tell them if you smoke, drink alcohol, or use illegal drugs. Some items may interact with your medicine. What should I watch for while using this medication? Your condition will be monitored carefully while you are receiving this medication. You may need blood work while taking this medication. This medication may increase your risk of getting an infection. Call your care team for advice if you get a fever, chills, sore throat, or other symptoms of a cold or flu. Do not treat yourself. Try to avoid being around people who are sick. You should make sure you get enough calcium and vitamin D while you are taking this medication, unless your care team tells you not to. Discuss the foods you eat and the vitamins you take with your care team. Some people who take this medication have severe bone, joint, or muscle pain. This medication may also increase your risk for jaw problems or a broken thigh bone. Tell your care team right away if you have severe pain in your jaw, bones, joints, or muscles. Tell your care team if you have any pain that does not go away or that gets worse. Talk  to your care team if you may be pregnant. Serious birth defects can occur if you take this medication during pregnancy and for 5 months after the last dose. You will need a negative pregnancy test before starting this medication. Contraception is recommended while taking this medication and for 5 months after the last dose. Your care team  can help you find the option that works for you. What side effects may I notice from receiving this medication? Side effects that you should report to your care team as soon as possible: Allergic reactions--skin rash, itching, hives, swelling of the face, lips, tongue, or throat Bone, joint, or muscle pain Low calcium level--muscle pain or cramps, confusion, tingling, or numbness in the hands or feet Osteonecrosis of the jaw--pain, swelling, or redness in the mouth, numbness of the jaw, poor healing after dental work, unusual discharge from the mouth, visible bones in the mouth Side effects that usually do not require medical attention (report to your care team if they continue or are bothersome): Cough Diarrhea Fatigue Headache Nausea This list may not describe all possible side effects. Call your doctor for medical advice about side effects. You may report side effects to FDA at 1-800-FDA-1088. Where should I keep my medication? This medication is given in a hospital or clinic. It will not be stored at home. NOTE: This sheet is a summary. It may not cover all possible information. If you have questions about this medicine, talk to your doctor, pharmacist, or health care provider.  2024 [employer]vier/Gold Standard (2022-01-31 00:00:00)

## 2024-04-08 LAB — KAPPA/LAMBDA LIGHT CHAINS
Kappa free light chain: 178.2 mg/L — ABNORMAL HIGH (ref 3.3–19.4)
Kappa, lambda light chain ratio: 8.61 — ABNORMAL HIGH (ref 0.26–1.65)
Lambda free light chains: 20.7 mg/L (ref 5.7–26.3)

## 2024-04-09 ENCOUNTER — Ambulatory Visit: Payer: Self-pay | Admitting: Medical Oncology

## 2024-04-09 LAB — UPEP/UIFE/LIGHT CHAINS/TP, 24-HR UR
% BETA, Urine: 38.5 %
ALPHA 1 URINE: 5 %
Albumin, U: 9.3 %
Alpha 2, Urine: 23.4 %
Free Kappa Lt Chains,Ur: 836.1 mg/L — ABNORMAL HIGH (ref 1.17–86.46)
Free Kappa/Lambda Ratio: 18.98 — ABNORMAL HIGH (ref 1.83–14.26)
Free Lambda Lt Chains,Ur: 44.05 mg/L — ABNORMAL HIGH (ref 0.27–15.21)
GAMMA GLOBULIN URINE: 23.8 %
M-SPIKE %, Urine: 11.1 % — ABNORMAL HIGH
M-Spike, Mg/24 Hr: 33 mg/(24.h) — ABNORMAL HIGH
Total Protein, Urine-Ur/day: 295 mg/(24.h) — ABNORMAL HIGH (ref 30–150)
Total Protein, Urine: 42.1 mg/dL
Total Volume: 700

## 2024-04-10 LAB — MULTIPLE MYELOMA PANEL, SERUM
Albumin SerPl Elph-Mcnc: 2.6 g/dL — ABNORMAL LOW (ref 2.9–4.4)
Albumin/Glob SerPl: 1 (ref 0.7–1.7)
Alpha 1: 0.3 g/dL (ref 0.0–0.4)
Alpha2 Glob SerPl Elph-Mcnc: 1 g/dL (ref 0.4–1.0)
B-Globulin SerPl Elph-Mcnc: 0.7 g/dL (ref 0.7–1.3)
Gamma Glob SerPl Elph-Mcnc: 0.9 g/dL (ref 0.4–1.8)
Globulin, Total: 2.8 g/dL (ref 2.2–3.9)
IgA: 217 mg/dL (ref 61–437)
IgG (Immunoglobin G), Serum: 871 mg/dL (ref 603–1613)
IgM (Immunoglobulin M), Srm: 40 mg/dL (ref 15–143)
Total Protein ELP: 5.4 g/dL — ABNORMAL LOW (ref 6.0–8.5)

## 2024-05-05 ENCOUNTER — Inpatient Hospital Stay: Admitting: Hematology & Oncology

## 2024-05-05 ENCOUNTER — Inpatient Hospital Stay: Attending: Family

## 2024-05-08 ENCOUNTER — Inpatient Hospital Stay

## 2024-05-08 ENCOUNTER — Inpatient Hospital Stay: Admitting: Hematology & Oncology

## 2024-05-15 ENCOUNTER — Inpatient Hospital Stay: Admitting: Hematology & Oncology

## 2024-05-15 ENCOUNTER — Inpatient Hospital Stay: Attending: Family

## 2024-05-15 ENCOUNTER — Encounter: Payer: Self-pay | Admitting: Hematology & Oncology

## 2024-05-15 VITALS — BP 122/50 | HR 67 | Temp 98.0°F | Resp 20 | Ht 70.0 in | Wt 133.4 lb

## 2024-05-15 DIAGNOSIS — C9000 Multiple myeloma not having achieved remission: Secondary | ICD-10-CM | POA: Diagnosis present

## 2024-05-15 LAB — CBC WITH DIFFERENTIAL (CANCER CENTER ONLY)
Abs Immature Granulocytes: 0.04 K/uL (ref 0.00–0.07)
Basophils Absolute: 0.1 K/uL (ref 0.0–0.1)
Basophils Relative: 1 %
Eosinophils Absolute: 0.7 K/uL — ABNORMAL HIGH (ref 0.0–0.5)
Eosinophils Relative: 8 %
HCT: 33.3 % — ABNORMAL LOW (ref 39.0–52.0)
Hemoglobin: 10.4 g/dL — ABNORMAL LOW (ref 13.0–17.0)
Immature Granulocytes: 0 %
Lymphocytes Relative: 21 %
Lymphs Abs: 1.9 K/uL (ref 0.7–4.0)
MCH: 30.1 pg (ref 26.0–34.0)
MCHC: 31.2 g/dL (ref 30.0–36.0)
MCV: 96.2 fL (ref 80.0–100.0)
Monocytes Absolute: 1.1 K/uL — ABNORMAL HIGH (ref 0.1–1.0)
Monocytes Relative: 12 %
Neutro Abs: 5.3 K/uL (ref 1.7–7.7)
Neutrophils Relative %: 58 %
Platelet Count: 199 K/uL (ref 150–400)
RBC: 3.46 MIL/uL — ABNORMAL LOW (ref 4.22–5.81)
RDW: 17.7 % — ABNORMAL HIGH (ref 11.5–15.5)
WBC Count: 9.1 K/uL (ref 4.0–10.5)
nRBC: 0 % (ref 0.0–0.2)

## 2024-05-15 LAB — CMP (CANCER CENTER ONLY)
ALT: 21 U/L (ref 0–44)
AST: 24 U/L (ref 15–41)
Albumin: 3.3 g/dL — ABNORMAL LOW (ref 3.5–5.0)
Alkaline Phosphatase: 77 U/L (ref 38–126)
Anion gap: 11 (ref 5–15)
BUN: 24 mg/dL — ABNORMAL HIGH (ref 8–23)
CO2: 23 mmol/L (ref 22–32)
Calcium: 8.6 mg/dL — ABNORMAL LOW (ref 8.9–10.3)
Chloride: 108 mmol/L (ref 98–111)
Creatinine: 1.74 mg/dL — ABNORMAL HIGH (ref 0.61–1.24)
GFR, Estimated: 37 mL/min — ABNORMAL LOW (ref >60)
Glucose, Bld: 130 mg/dL — ABNORMAL HIGH (ref 70–99)
Potassium: 4.2 mmol/L (ref 3.5–5.1)
Sodium: 142 mmol/L (ref 135–145)
Total Bilirubin: 0.4 mg/dL (ref 0.0–1.2)
Total Protein: 6 g/dL — ABNORMAL LOW (ref 6.5–8.1)

## 2024-05-15 LAB — LACTATE DEHYDROGENASE: LDH: 215 U/L — ABNORMAL HIGH (ref 98–192)

## 2024-05-15 NOTE — Progress Notes (Signed)
 Treatment 6 weeks Hematology and Oncology Follow Up Visit  Shawn Bauer [facility] [id number] [birth year] 89 y.o. 05/15/2024   Principle Diagnosis:  IgG Kappa myeloma -- high risk due to t(4:21)    Prior Treatment:  Velcade /Decadron  -- started 10/09/2019, s/p cycle 34 - treatment frequency changed to every other week on 01/21/2021 now s/p cycle 20 --HOLD TREATMENT STARTING 07/29/2023  Current Therapy:        Observation Xgeva  120 mg IM q 3 months -- next dose on 06/2024       Interim History:  Shawn Bauer is here today for follow-up.  He comes in with a rolling walker.  He is getting some physical therapy.  He is having some problems with falling.  Not sure as to why he has been falling.  He did have some neuropathy but this is improved it is feet.  He has little bit tingling in the fingers.  His weight is holding steady.  He is eating okay.  I have noted though that his kappa light chain has been going up.  When we last saw him, his kappa light chain was 17.8 mg/dL.  There was no monoclonal spike in the blood.  His IgG level was 871 mg/dL.  I think that if this continues to elevate, we will going to have to get him started on something.  I think that he might be a good candidate for single agent Revlimid.  He has had no problems with cough.  He has had no bleeding.  There has been no fever.  He has had no rashes.  He has had no leg swelling.  Overall, I would say that his performance status is probably ECOG 2.   Wt Readings from Last 3 Encounters:  05/15/24 133 lb 6.4 oz (60.5 kg)  04/07/24 135 lb 12.8 oz (61.6 kg)  03/10/24 136 lb (61.7 kg)   Medications:  Allergies as of 05/15/2024   No Known Allergies      Medication List        Accurate as of May 15, 2024 10:40 AM. If you have any questions, ask your nurse or doctor.          amiodarone  200 MG tablet Commonly known as: PACERONE  Take 1 tablet (200 mg total) by mouth as directed. Take 200mg  daily Monday - Friday only.    atorvastatin  40 MG tablet Commonly known as: LIPITOR TAKE 1 TABLET BY MOUTH EVERY DAY   Breo Ellipta 200-25 MCG/ACT Aepb Generic drug: fluticasone furoate-vilanterol 1 puff daily.   CINNAMON PO Take 1,000 mg by mouth daily.   clopidogrel  75 MG tablet Commonly known as: PLAVIX  TAKE 1 TABLET BY MOUTH EVERY DAY WITH BREAKFAST   cyanocobalamin  1000 MCG tablet Take 1,000 mcg by mouth daily.   gabapentin  400 MG capsule Commonly known as: NEURONTIN  Take 1 capsule (400 mg total) by mouth 4 (four) times daily. Please take 1 capsule 3 times a day and also 1 at bedtime.   metoprolol  succinate 50 MG 24 hr tablet Commonly known as: TOPROL -XL Take 25 mg by mouth daily.   nitroGLYCERIN  0.4 MG SL tablet Commonly known as: NITROSTAT  Place 1 tablet (0.4 mg total) under the tongue every 5 (five) minutes as needed for chest pain.   olmesartan 40 MG tablet Commonly known as: BENICAR Take 40 mg by mouth in the morning.   polyethylene glycol 17 g packet Commonly known as: MIRALAX / GLYCOLAX Take 17 g by mouth daily. What changed:  when to take this  reasons to take this   promethazine  12.5 MG tablet Commonly known as: PHENERGAN  TAKE 1 TABLET (12.5 MG TOTAL) BY MOUTH EVERY 8 HOURS AS NEEDED FOR NAUSEA AND VOMITING   traMADol  50 MG tablet Commonly known as: ULTRAM  Take 50 mg by mouth 2 (two) times daily as needed (for pain).   traZODone 50 MG tablet Commonly known as: DESYREL Take 50 mg by mouth at bedtime.   Vitamin B6 250 MG Tabs Take 2 tablets (500 mg total) by mouth daily after breakfast.        Allergies: No Known Allergies  Past Medical History, Surgical history, Social history, and Family History were reviewed and updated.  Review of Systems:  Review of Systems  Constitutional: Negative.   HENT: Negative.    Eyes: Negative.   Respiratory: Negative.    Cardiovascular: Negative.   Gastrointestinal: Negative.   Genitourinary: Negative.   Musculoskeletal:  Negative.   Skin: Negative.   Neurological: Negative.   Endo/Heme/Allergies: Negative.   Psychiatric/Behavioral: Negative.       Physical Exam:  Vitals:   05/15/24 0944  BP: (!) 122/50  Pulse: 67  Resp: 20  Temp: 98 F (36.7 C)  SpO2: 94%     Wt Readings from Last 3 Encounters:  05/15/24 133 lb 6.4 oz (60.5 kg)  04/07/24 135 lb 12.8 oz (61.6 kg)  03/10/24 136 lb (61.7 kg)   Physical Exam Vitals reviewed.  HENT:     Head: Normocephalic and atraumatic.  Eyes:     Pupils: Pupils are equal, round, and reactive to light.  Cardiovascular:     Rate and Rhythm: Normal rate and regular rhythm.     Heart sounds: Normal heart sounds.  Pulmonary:     Effort: Pulmonary effort is normal.     Breath sounds: Normal breath sounds.  Abdominal:     General: Bowel sounds are normal.     Palpations: Abdomen is soft.  Musculoskeletal:        General: No tenderness or deformity. Normal range of motion.     Cervical back: Normal range of motion.  Lymphadenopathy:     Cervical: No cervical adenopathy.  Skin:    General: Skin is warm and dry.     Findings: No erythema or rash.     Comments: There is a raised, scantly erythematic scaly round lesion of the left ventral forearm measuring roughly 1cm. No bleeding or discharge   Neurological:     Mental Status: He is alert and oriented to person, place, and time.  Psychiatric:        Behavior: Behavior normal.        Thought Content: Thought content normal.        Judgment: Judgment normal.      Lab Results  Component Value Date   WBC 9.1 05/15/2024   HGB 10.4 (L) 05/15/2024   HCT 33.3 (L) 05/15/2024   MCV 96.2 05/15/2024   PLT 199 05/15/2024   Lab Results  Component Value Date   FERRITIN 11 (L) 04/12/2022   IRON  36 (L) 04/12/2022   TIBC 330 04/12/2022   UIBC 294 04/12/2022   IRONPCTSAT 11 (L) 04/12/2022   Lab Results  Component Value Date   RETICCTPCT 1.9 04/12/2022   RBC 3.46 (L) 05/15/2024   Lab Results   Component Value Date   KPAFRELGTCHN 178.2 (H) 04/07/2024   LAMBDASER 20.7 04/07/2024   KAPLAMBRATIO 18.98 (H) 04/07/2024   Lab Results  Component Value Date   IGGSERUM 871 04/07/2024  IGA 217 04/07/2024   IGMSERUM 40 04/07/2024   Lab Results  Component Value Date   TOTALPROTELP 5.4 (L) 04/07/2024   ALBUMINELP 2.6 (L) 03/10/2024   A1GS 0.3 03/10/2024   A2GS 1.0 03/10/2024   BETS 0.7 03/10/2024   GAMS 0.8 03/10/2024   MSPIKE Not Observed 03/10/2024   SPEI Comment 06/11/2023     Chemistry      Component Value Date/Time   NA 142 05/15/2024 0923   K 4.2 05/15/2024 0923   CL 108 05/15/2024 0923   CO2 23 05/15/2024 0923   BUN 24 (H) 05/15/2024 0923   CREATININE 1.74 (H) 05/15/2024 0923      Component Value Date/Time   CALCIUM  8.6 (L) 05/15/2024 0923   ALKPHOS 77 05/15/2024 0923   AST 24 05/15/2024 0923   ALT 21 05/15/2024 0923   BILITOT 0.4 05/15/2024 0923       Impression and Plan: Shawn Bauer is a very pleasant 89 yo caucasian gentleman with IgG kappa myeloma.  He has been off treatment now for about 7 months.  He has done incredibly well off treatment with significant improvement in his quality of life.   Again, we will have to see what his light chains are.  This will give us  an idea as to whether or not we are to have to get him back on some kind of therapy.  This is all about quality of life.  I want to make sure that what ever we do will not hinder his quality of life.  We will see about doing his Xgeva  we will see him back in 6 weeks.   Maude JONELLE Crease, MD 8/22/202510:40 AM

## 2024-05-18 ENCOUNTER — Ambulatory Visit: Payer: Self-pay | Admitting: Medical Oncology

## 2024-05-18 LAB — KAPPA/LAMBDA LIGHT CHAINS
Kappa free light chain: 164.3 mg/L — ABNORMAL HIGH (ref 3.3–19.4)
Kappa, lambda light chain ratio: 8.79 — ABNORMAL HIGH (ref 0.26–1.65)
Lambda free light chains: 18.7 mg/L (ref 5.7–26.3)

## 2024-05-19 LAB — MULTIPLE MYELOMA PANEL, SERUM
Albumin SerPl Elph-Mcnc: 2.8 g/dL — ABNORMAL LOW (ref 2.9–4.4)
Albumin/Glob SerPl: 1.1 (ref 0.7–1.7)
Alpha 1: 0.3 g/dL (ref 0.0–0.4)
Alpha2 Glob SerPl Elph-Mcnc: 0.8 g/dL (ref 0.4–1.0)
B-Globulin SerPl Elph-Mcnc: 0.8 g/dL (ref 0.7–1.3)
Gamma Glob SerPl Elph-Mcnc: 0.8 g/dL (ref 0.4–1.8)
Globulin, Total: 2.6 g/dL (ref 2.2–3.9)
IgA: 243 mg/dL (ref 61–437)
IgG (Immunoglobin G), Serum: 961 mg/dL (ref 603–1613)
IgM (Immunoglobulin M), Srm: 45 mg/dL (ref 15–143)
Total Protein ELP: 5.4 g/dL — ABNORMAL LOW (ref 6.0–8.5)

## 2024-06-26 ENCOUNTER — Inpatient Hospital Stay: Admitting: Hematology & Oncology

## 2024-06-26 ENCOUNTER — Inpatient Hospital Stay

## 2024-06-28 ENCOUNTER — Other Ambulatory Visit: Payer: Self-pay | Admitting: Hematology & Oncology

## 2024-06-29 ENCOUNTER — Encounter: Payer: Self-pay | Admitting: Hematology & Oncology

## 2024-07-02 ENCOUNTER — Encounter: Payer: Self-pay | Admitting: Neurology

## 2024-07-02 ENCOUNTER — Inpatient Hospital Stay: Attending: Family

## 2024-07-02 ENCOUNTER — Inpatient Hospital Stay (HOSPITAL_BASED_OUTPATIENT_CLINIC_OR_DEPARTMENT_OTHER): Admitting: Hematology & Oncology

## 2024-07-02 ENCOUNTER — Encounter: Payer: Self-pay | Admitting: Hematology & Oncology

## 2024-07-02 ENCOUNTER — Inpatient Hospital Stay

## 2024-07-02 VITALS — BP 144/64 | HR 67 | Temp 97.4°F | Resp 18 | Wt 137.2 lb

## 2024-07-02 DIAGNOSIS — G63 Polyneuropathy in diseases classified elsewhere: Secondary | ICD-10-CM | POA: Diagnosis not present

## 2024-07-02 DIAGNOSIS — C9000 Multiple myeloma not having achieved remission: Secondary | ICD-10-CM

## 2024-07-02 DIAGNOSIS — D472 Monoclonal gammopathy: Secondary | ICD-10-CM | POA: Diagnosis not present

## 2024-07-02 DIAGNOSIS — Z79899 Other long term (current) drug therapy: Secondary | ICD-10-CM | POA: Insufficient documentation

## 2024-07-02 DIAGNOSIS — G629 Polyneuropathy, unspecified: Secondary | ICD-10-CM | POA: Diagnosis not present

## 2024-07-02 LAB — CBC WITH DIFFERENTIAL (CANCER CENTER ONLY)
Abs Immature Granulocytes: 0.03 K/uL (ref 0.00–0.07)
Basophils Absolute: 0.1 K/uL (ref 0.0–0.1)
Basophils Relative: 1 %
Eosinophils Absolute: 0.7 K/uL — ABNORMAL HIGH (ref 0.0–0.5)
Eosinophils Relative: 8 %
HCT: 33.8 % — ABNORMAL LOW (ref 39.0–52.0)
Hemoglobin: 10.6 g/dL — ABNORMAL LOW (ref 13.0–17.0)
Immature Granulocytes: 0 %
Lymphocytes Relative: 25 %
Lymphs Abs: 2.2 K/uL (ref 0.7–4.0)
MCH: 30.1 pg (ref 26.0–34.0)
MCHC: 31.4 g/dL (ref 30.0–36.0)
MCV: 96 fL (ref 80.0–100.0)
Monocytes Absolute: 0.9 K/uL (ref 0.1–1.0)
Monocytes Relative: 10 %
Neutro Abs: 4.9 K/uL (ref 1.7–7.7)
Neutrophils Relative %: 56 %
Platelet Count: 188 K/uL (ref 150–400)
RBC: 3.52 MIL/uL — ABNORMAL LOW (ref 4.22–5.81)
RDW: 17.2 % — ABNORMAL HIGH (ref 11.5–15.5)
WBC Count: 8.9 K/uL (ref 4.0–10.5)
nRBC: 0 % (ref 0.0–0.2)

## 2024-07-02 LAB — LACTATE DEHYDROGENASE: LDH: 197 U/L — ABNORMAL HIGH (ref 98–192)

## 2024-07-02 LAB — CMP (CANCER CENTER ONLY)
ALT: 19 U/L (ref 0–44)
AST: 22 U/L (ref 15–41)
Albumin: 3.5 g/dL (ref 3.5–5.0)
Alkaline Phosphatase: 87 U/L (ref 38–126)
Anion gap: 8 (ref 5–15)
BUN: 22 mg/dL (ref 8–23)
CO2: 27 mmol/L (ref 22–32)
Calcium: 8.6 mg/dL — ABNORMAL LOW (ref 8.9–10.3)
Chloride: 108 mmol/L (ref 98–111)
Creatinine: 1.75 mg/dL — ABNORMAL HIGH (ref 0.61–1.24)
GFR, Estimated: 37 mL/min — ABNORMAL LOW (ref >60)
Glucose, Bld: 106 mg/dL — ABNORMAL HIGH (ref 70–99)
Potassium: 4 mmol/L (ref 3.5–5.1)
Sodium: 143 mmol/L (ref 135–145)
Total Bilirubin: 0.3 mg/dL (ref 0.0–1.2)
Total Protein: 6 g/dL — ABNORMAL LOW (ref 6.5–8.1)

## 2024-07-02 MED ORDER — GABAPENTIN 400 MG PO CAPS: 400.0000 mg | ORAL_CAPSULE | Freq: Every day | ORAL | 4 refills | Status: AC

## 2024-07-02 MED ORDER — DULOXETINE HCL 30 MG PO CPEP: 30.0000 mg | ORAL_CAPSULE | Freq: Two times a day (BID) | ORAL | 4 refills | Status: DC

## 2024-07-02 NOTE — Progress Notes (Signed)
 Treatment 6 weeks Hematology and Oncology Follow Up Visit  Shawn Bauer [id number] Aug 13, 1934 89 y.o. 07/02/2024   Principle Diagnosis:  IgG Kappa myeloma -- high risk due to t(4:21)    Prior Treatment:  Velcade /Decadron  -- started 10/09/2019, s/p cycle 34 - treatment frequency changed to every other week on 01/21/2021 now s/p cycle 20 --HOLD TREATMENT STARTING 07/29/2023  Current Therapy:        Observation Xgeva  120 mg IM q 3 months -- next dose on 08/2024       Interim History:  Shawn Bauer is here today for follow-up.  He is managing okay.  He is some problems with falling.  Is hard to say if this is all from neuropathy.  He has had no treatment now for 11 months.  I would have believe that neuropathy would be improving if this is from chemotherapy.  Again, he does have the myeloma.  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  I will try him on Cymbalta.  I will try him on 30 mg p.o. twice daily of Cymbalta to see if this may help with the neuropathy.  His appetite is good.  He has had no problems with nausea or vomiting.  He has had no problems with bowels or bladder.  He has had no bleeding.  He has had no fever.  He has had no cough.  When we last saw him, there was no monoclonal spike in his blood.  His IgG level was 961 mg/dL.  The Kappa light chain was 16.4 mg/dL.  Overall, I would say that his performance status is probably ECOG 2.     Wt Readings from Last 3 Encounters:  07/02/24 137 lb 4 oz (62.3 kg)  05/15/24 133 lb 6.4 oz (60.5 kg)  04/07/24 135 lb 12.8 oz (61.6 kg)   Medications:  Allergies as of 07/02/2024   No Known Allergies      Medication List        Accurate as of July 02, 2024  3:11 PM. If you have any questions, ask your nurse or doctor.          amiodarone  200 MG tablet Commonly known as: PACERONE  Take 1 tablet (200 mg total) by mouth as directed. Take 200mg  daily Monday - Friday only.   atorvastatin  40 MG  tablet Commonly known as: LIPITOR TAKE 1 TABLET BY MOUTH EVERY DAY   Breo Ellipta 200-25 MCG/ACT Aepb Generic drug: fluticasone furoate-vilanterol 1 puff daily.   CINNAMON PO Take 1,000 mg by mouth daily.   clopidogrel  75 MG tablet Commonly known as: PLAVIX  TAKE 1 TABLET BY MOUTH EVERY DAY WITH BREAKFAST   cyanocobalamin  1000 MCG tablet Take 1,000 mcg by mouth daily.   DULoxetine 30 MG capsule Commonly known as: CYMBALTA Take 1 capsule (30 mg total) by mouth 2 (two) times daily. Started by: Maude JONELLE Crease   gabapentin  400 MG capsule Commonly known as: NEURONTIN  Take 1 capsule (400 mg total) by mouth at bedtime. What changed: See the new instructions. Changed by: Maude JONELLE Crease   metoprolol  succinate 50 MG 24 hr tablet Commonly known as: TOPROL -XL Take 25 mg by mouth daily.   nitroGLYCERIN  0.4 MG SL tablet Commonly known as: NITROSTAT  Place 1 tablet (0.4 mg total) under the tongue every 5 (five) minutes as needed for chest pain.   olmesartan 40 MG tablet Commonly known as: BENICAR Take 40 mg by mouth in the morning.   polyethylene glycol 17 g packet  Commonly known as: MIRALAX / GLYCOLAX Take 17 g by mouth daily.   promethazine  12.5 MG tablet Commonly known as: PHENERGAN  TAKE 1 TABLET (12.5 MG TOTAL) BY MOUTH EVERY 8 HOURS AS NEEDED FOR NAUSEA AND VOMITING   traMADol  50 MG tablet Commonly known as: ULTRAM  Take 50 mg by mouth 2 (two) times daily as needed (for pain).   traZODone 50 MG tablet Commonly known as: DESYREL Take 50 mg by mouth at bedtime.   Vitamin B6 250 MG Tabs Take 2 tablets (500 mg total) by mouth daily after breakfast.        Allergies: No Known Allergies  Past Medical History, Surgical history, Social history, and Family History were reviewed and updated.  Review of Systems:  Review of Systems  Constitutional: Negative.   HENT: Negative.    Eyes: Negative.   Respiratory: Negative.    Cardiovascular: Negative.    Gastrointestinal: Negative.   Genitourinary: Negative.   Musculoskeletal: Negative.   Skin: Negative.   Neurological: Negative.   Endo/Heme/Allergies: Negative.   Psychiatric/Behavioral: Negative.       Physical Exam:  Vitals:   07/02/24 1318  BP: (!) 144/64  Pulse: 67  Resp: 18  Temp: (!) 97.4 F (36.3 C)  SpO2: 98%     Wt Readings from Last 3 Encounters:  07/02/24 137 lb 4 oz (62.3 kg)  05/15/24 133 lb 6.4 oz (60.5 kg)  04/07/24 135 lb 12.8 oz (61.6 kg)   Physical Exam Vitals reviewed.  HENT:     Head: Normocephalic and atraumatic.  Eyes:     Pupils: Pupils are equal, round, and reactive to light.  Cardiovascular:     Rate and Rhythm: Normal rate and regular rhythm.     Heart sounds: Normal heart sounds.  Pulmonary:     Effort: Pulmonary effort is normal.     Breath sounds: Normal breath sounds.  Abdominal:     General: Bowel sounds are normal.     Palpations: Abdomen is soft.  Musculoskeletal:        General: No tenderness or deformity. Normal range of motion.     Cervical back: Normal range of motion.  Lymphadenopathy:     Cervical: No cervical adenopathy.  Skin:    General: Skin is warm and dry.     Findings: No erythema or rash.     Comments: There is a raised, scantly erythematic scaly round lesion of the left ventral forearm measuring roughly 1cm. No bleeding or discharge   Neurological:     Mental Status: He is alert and oriented to person, place, and time.  Psychiatric:        Behavior: Behavior normal.        Thought Content: Thought content normal.        Judgment: Judgment normal.      Lab Results  Component Value Date   WBC 8.9 07/02/2024   HGB 10.6 (L) 07/02/2024   HCT 33.8 (L) 07/02/2024   MCV 96.0 07/02/2024   PLT 188 07/02/2024   Lab Results  Component Value Date   FERRITIN 11 (L) 04/12/2022   IRON  36 (L) 04/12/2022   TIBC 330 04/12/2022   UIBC 294 04/12/2022   IRONPCTSAT 11 (L) 04/12/2022   Lab Results  Component  Value Date   RETICCTPCT 1.9 04/12/2022   RBC 3.52 (L) 07/02/2024   Lab Results  Component Value Date   KPAFRELGTCHN 164.3 (H) 05/15/2024   LAMBDASER 18.7 05/15/2024   KAPLAMBRATIO 8.79 (H) 05/15/2024  Lab Results  Component Value Date   IGGSERUM 961 05/15/2024   IGA 243 05/15/2024   IGMSERUM 45 05/15/2024   Lab Results  Component Value Date   TOTALPROTELP 5.4 (L) 05/15/2024   ALBUMINELP 2.6 (L) 03/10/2024   A1GS 0.3 03/10/2024   A2GS 1.0 03/10/2024   BETS 0.7 03/10/2024   GAMS 0.8 03/10/2024   MSPIKE Not Observed 03/10/2024   SPEI Comment 06/11/2023     Chemistry      Component Value Date/Time   NA 143 07/02/2024 1311   K 4.0 07/02/2024 1311   CL 108 07/02/2024 1311   CO2 27 07/02/2024 1311   BUN 22 07/02/2024 1311   CREATININE 1.75 (H) 07/02/2024 1311      Component Value Date/Time   CALCIUM  8.6 (L) 07/02/2024 1311   ALKPHOS 87 07/02/2024 1311   AST 22 07/02/2024 1311   ALT 19 07/02/2024 1311   BILITOT 0.3 07/02/2024 1311       Impression and Plan: Shawn Bauer is a very pleasant 89 yo caucasian gentleman with IgG kappa myeloma.  He has been off treatment now for about 11 months.  He has done incredibly well off treatment with significant improvement in his quality of life.  Again, he still has a neuropathy.  We may have to see about getting him to a Neurologist to see if they may be able to help him out.  I will do the Xgeva  while we see him back.  I would like to get him back after Thanksgiving.  Hopefully, the Cymbalta will help out.    Maude JONELLE Crease, MD 10/9/20253:11 PM

## 2024-07-03 ENCOUNTER — Inpatient Hospital Stay

## 2024-07-03 VITALS — BP 136/51 | HR 71 | Temp 97.6°F | Resp 18

## 2024-07-03 DIAGNOSIS — C9000 Multiple myeloma not having achieved remission: Secondary | ICD-10-CM

## 2024-07-03 DIAGNOSIS — D631 Anemia in chronic kidney disease: Secondary | ICD-10-CM

## 2024-07-03 MED ORDER — DENOSUMAB 120 MG/1.7ML ~~LOC~~ SOLN
120.0000 mg | Freq: Once | SUBCUTANEOUS | Status: AC
  Administered 2024-07-03: 120 mg via SUBCUTANEOUS
  Filled 2024-07-03: qty 1.7

## 2024-07-03 NOTE — Patient Instructions (Signed)
 Denosumab  Injection (Oncology) What is this medication? DENOSUMAB  (den oh SUE mab) prevents weakened bones caused by cancer. It may also be used to treat noncancerous bone tumors that cannot be removed by surgery. It can also be used to treat high calcium  levels in the blood caused by cancer. It works by blocking a protein that causes bones to break down quickly. This slows down the release of calcium  from bones, which lowers calcium  levels in your blood. It also makes your bones stronger and less likely to break (fracture). This medicine may be used for other purposes; ask your health care provider or pharmacist if you have questions. COMMON BRAND NAME(S): XGEVA  What should I tell my care team before I take this medication? They need to know if you have any of these conditions: Dental disease Having surgery or tooth extraction Infection Kidney disease Low levels of calcium  or vitamin D  in the blood Malnutrition On hemodialysis Skin conditions or sensitivity Thyroid  or parathyroid disease An unusual reaction to denosumab , other medications, foods, dyes, or preservatives Pregnant or trying to get pregnant Breast-feeding How should I use this medication? This medication is for injection under the skin. It is given by your care team in a hospital or clinic setting. A special MedGuide will be given to you before each treatment. Be sure to read this information carefully each time. Talk to your care team about the use of this medication in children. While it may be prescribed for children as young as 13 years for selected conditions, precautions do apply. Overdosage: If you think you have taken too much of this medicine contact a poison control center or emergency room at once. NOTE: This medicine is only for you. Do not share this medicine with others. What if I miss a dose? Keep appointments for follow-up doses. It is important not to miss your dose. Call your care team if you are unable to  keep an appointment. What may interact with this medication? Do not take this medication with any of the following: Other medications containing denosumab  This medication may also interact with the following: Medications that lower your chance of fighting infection Steroid medications, such as prednisone  or cortisone This list may not describe all possible interactions. Give your health care provider a list of all the medicines, herbs, non-prescription drugs, or dietary supplements you use. Also tell them if you smoke, drink alcohol, or use illegal drugs. Some items may interact with your medicine. What should I watch for while using this medication? Your condition will be monitored carefully while you are receiving this medication. You may need blood work while taking this medication. This medication may increase your risk of getting an infection. Call your care team for advice if you get a fever, chills, sore throat, or other symptoms of a cold or flu. Do not treat yourself. Try to avoid being around people who are sick. You should make sure you get enough calcium  and vitamin D  while you are taking this medication, unless your care team tells you not to. Discuss the foods you eat and the vitamins you take with your care team. Some people who take this medication have severe bone, joint, or muscle pain. This medication may also increase your risk for jaw problems or a broken thigh bone. Tell your care team right away if you have severe pain in your jaw, bones, joints, or muscles. Tell your care team if you have any pain that does not go away or that gets worse. Talk  to your care team if you may be pregnant. Serious birth defects can occur if you take this medication during pregnancy and for 5 months after the last dose. You will need a negative pregnancy test before starting this medication. Contraception is recommended while taking this medication and for 5 months after the last dose. Your care team  can help you find the option that works for you. What side effects may I notice from receiving this medication? Side effects that you should report to your care team as soon as possible: Allergic reactions--skin rash, itching, hives, swelling of the face, lips, tongue, or throat Bone, joint, or muscle pain Low calcium  level--muscle pain or cramps, confusion, tingling, or numbness in the hands or feet Osteonecrosis of the jaw--pain, swelling, or redness in the mouth, numbness of the jaw, poor healing after dental work, unusual discharge from the mouth, visible bones in the mouth Side effects that usually do not require medical attention (report to your care team if they continue or are bothersome): Cough Diarrhea Fatigue Headache Nausea This list may not describe all possible side effects. Call your doctor for medical advice about side effects. You may report side effects to FDA at 1-800-FDA-1088. Where should I keep my medication? This medication is given in a hospital or clinic. It will not be stored at home. NOTE: This sheet is a summary. It may not cover all possible information. If you have questions about this medicine, talk to your doctor, pharmacist, or health care provider.  2024 [employer] (2022-01-31 00:00:00)

## 2024-07-04 LAB — IGG, IGA, IGM
IgA: 253 mg/dL (ref 61–437)
IgG (Immunoglobin G), Serum: 992 mg/dL (ref 603–1613)
IgM (Immunoglobulin M), Srm: 57 mg/dL (ref 15–143)

## 2024-07-06 LAB — KAPPA/LAMBDA LIGHT CHAINS
Kappa free light chain: 158.2 mg/L — ABNORMAL HIGH (ref 3.3–19.4)
Kappa, lambda light chain ratio: 7.57 — ABNORMAL HIGH (ref 0.26–1.65)
Lambda free light chains: 20.9 mg/L (ref 5.7–26.3)

## 2024-07-07 LAB — PROTEIN ELECTROPHORESIS, SERUM, WITH REFLEX
A/G Ratio: 1 (ref 0.7–1.7)
Albumin ELP: 2.9 g/dL (ref 2.9–4.4)
Alpha-1-Globulin: 0.3 g/dL (ref 0.0–0.4)
Alpha-2-Globulin: 0.8 g/dL (ref 0.4–1.0)
Beta Globulin: 0.8 g/dL (ref 0.7–1.3)
Gamma Globulin: 1 g/dL (ref 0.4–1.8)
Globulin, Total: 2.9 g/dL (ref 2.2–3.9)
Total Protein ELP: 5.8 g/dL — ABNORMAL LOW (ref 6.0–8.5)

## 2024-07-09 ENCOUNTER — Other Ambulatory Visit: Payer: Self-pay | Admitting: Hematology & Oncology

## 2024-07-22 ENCOUNTER — Encounter: Payer: Self-pay | Admitting: Cardiovascular Disease

## 2024-07-22 ENCOUNTER — Ambulatory Visit: Attending: Cardiovascular Disease | Admitting: Cardiovascular Disease

## 2024-07-22 VITALS — BP 155/68 | HR 67 | Ht 70.0 in | Wt 136.9 lb

## 2024-07-22 DIAGNOSIS — I4729 Other ventricular tachycardia: Secondary | ICD-10-CM | POA: Insufficient documentation

## 2024-07-22 DIAGNOSIS — E782 Mixed hyperlipidemia: Secondary | ICD-10-CM | POA: Insufficient documentation

## 2024-07-22 DIAGNOSIS — I2510 Atherosclerotic heart disease of native coronary artery without angina pectoris: Secondary | ICD-10-CM | POA: Insufficient documentation

## 2024-07-22 DIAGNOSIS — I10 Essential (primary) hypertension: Secondary | ICD-10-CM | POA: Insufficient documentation

## 2024-07-22 MED ORDER — NITROGLYCERIN 0.4 MG SL SUBL: 0.4000 mg | SUBLINGUAL_TABLET | SUBLINGUAL | 3 refills | Status: AC | PRN

## 2024-07-22 NOTE — Progress Notes (Signed)
 Cardiology Office Note:    Date:  07/22/2024   ID:  Shawn Bauer, DOB 1934-02-28, MRN [id number]  PCP:  Gerome Brunet, DO   [facility] Providers Cardiologist:  Ozell Fell, MD Electrophysiologist:  Danelle Birmingham, MD     Referring MD: Gerome Brunet, DO   Chief Complaint  Patient presents with   Coronary Artery Disease    History of Present Illness:    Shawn Bauer is a 89 y.o. male with a hx of:  Coronary artery disease  S/p CABG in 2001 Myoview 12/2012: low risk (small inf ischemia) - med Rx TTE 04/04/2020: EF 60-65, mild AV sclerosis S/p 2.5 x 12 mm and 2.5 x 22 mm DES (overlapping) to the RCA in 04/2021-PCI complicated by spiral dissection LHC 05/08/2021: SVG-Dx patent, free left radial-LCx patent, LIMA-LAD patent, SVG-RCA occluded, RCA heavily calcified with diffuse 90 (PCI) TTE 05/08/2021: EF 55-60, global HK, mild LVH, GR 1 DD, normal RVSF, normal PASP LHC 06/01/2021: RCA stents patent, 3/4 grafts patent NSVT Atrial flutter s/p CTI ablation  Anticoagulation discontinued for ablation Amiodarone  therapy Hypertension  Hyperlipidemia  Multiple myeloma   I saw the patient last in the office 2 years ago.  He has had regular follow-up with our APP's and has also seen Dr. Birmingham in the interim.  He has managed with amiodarone  200 mg Monday through Friday.  His thyroid and liver function studies have been normal.  The patient reports no respiratory complaints and specifically denies chest pain, chest pressure, or shortness of breath.  He was last hospitalized in 2022 when he presented with nonsustained VT.  He has had no further symptoms of arrhythmia or angina since that time.  The patient has been diagnosed with myeloma and he is now off of treatment.  He is feeling better and he follows regularly with Dr. Cloretta.  He reports problems with neuropathy in his hands and his feet.  Current Medications: Current Meds  Medication Sig   amiodarone  (PACERONE ) 200 MG  tablet Take 1 tablet (200 mg total) by mouth as directed. Take 200mg  daily Monday - Friday only.   atorvastatin  (LIPITOR) 40 MG tablet TAKE 1 TABLET BY MOUTH EVERY DAY   BREO ELLIPTA 200-25 MCG/ACT AEPB 1 puff daily.   CINNAMON PO Take 1,000 mg by mouth daily.   clopidogrel  (PLAVIX ) 75 MG tablet TAKE 1 TABLET BY MOUTH EVERY DAY WITH BREAKFAST   cyanocobalamin  1000 MCG tablet Take 1,000 mcg by mouth daily.   DULoxetine (CYMBALTA) 30 MG capsule Take 1 capsule (30 mg total) by mouth 2 (two) times daily.   gabapentin  (NEURONTIN ) 400 MG capsule Take 1 capsule (400 mg total) by mouth at bedtime.   metoprolol  succinate (TOPROL -XL) 50 MG 24 hr tablet Take 25 mg by mouth daily.   olmesartan (BENICAR) 40 MG tablet Take 40 mg by mouth in the morning.   polyethylene glycol (MIRALAX / GLYCOLAX) 17 g packet Take 17 g by mouth daily.   promethazine  (PHENERGAN ) 12.5 MG tablet TAKE 1 TABLET (12.5 MG TOTAL) BY MOUTH EVERY 8 HOURS AS NEEDED FOR NAUSEA AND VOMITING   Pyridoxine HCl (VITAMIN B6) 250 MG TABS Take 2 tablets (500 mg total) by mouth daily after breakfast.   traMADol  (ULTRAM ) 50 MG tablet Take 50 mg by mouth 2 (two) times daily as needed (for pain).   [DISCONTINUED] nitroGLYCERIN  (NITROSTAT ) 0.4 MG SL tablet Place 1 tablet (0.4 mg total) under the tongue every 5 (five) minutes as needed for chest pain.  Allergies:   Patient has no known allergies.   ROS:   Please see the history of present illness.    All other systems reviewed and are negative.  EKGs/Labs/Other Studies Reviewed:    The following studies were reviewed today: Cardiac Studies & Procedures   ______________________________________________________________________________________________ CARDIAC CATHETERIZATION  CARDIAC CATHETERIZATION 06/01/2021  Conclusion Images from the original result were not included.    Mid LM to Dist LM lesion is 99% stenosed.   Prox LAD lesion is 100% stenosed.   Ost Cx to Prox Cx lesion is 100%  stenosed.   Ost RCA to Prox RCA lesion is 60% stenosed.   Mid RCA lesion is 15% stenosed.   Origin lesion is 100% stenosed.   Dist RCA lesion is 40% stenosed.   Non-stenotic Mid RCA to Dist RCA lesion was previously treated.  Shawn Bauer is a 89 y.o. male   [id number] LOCATION:  FACILITY: [facility] PHYSICIAN: Dorn Lesches, M.D. [birth year]   DATE OF PROCEDURE:  06/01/2021  DATE OF DISCHARGE:     CARDIAC CATHETERIZATION    History obtained from chart review.89 y.o. male with a history of CAD s/ p remote CABG in 2001 with recent NSTEMI on 05/07/2021 s/p high risk PCI of RCA complicated by balloon rupture and spiral dissection treated with overlapping DES stenting x2 from distal to mid vessel, non-sustained VT, PVCs, atrial flutter s/p ablation in 04/2020 hypertension, hyperlipidemia, CKD stage III, multiple myeloma, and remote tobacco abuse (quit in 1965) who presented with chest pain and was found to have frequent runs of non-sustained VT. his enzymes were positive troponins in the 2000 range.  He presents now for diagnostic coronary angiography to rule out an ischemic etiology to his chest pain, non-STEMI and VT.  Impression [name] had a complex RCA intervention by Dr. Claudene approxi-1 month ago in the setting of a non-STEMI.  He had spiral dissection of his native right and underwent PCI and drug-eluting stenting of the proximal and mid RCA.  The RCA graft was occluded.  The remaining grafts were patent including the obtuse marginal branch, diagonal branch graft and LIMA to the LAD.  He was admitted with nonsustained VT and chest pain.  His troponins were positive.  His anatomy today is unchanged from 1 month ago with no obvious culprit lesions.  Specifically, the RCA stents were widely patent.  The sheath will be removed and pressure held on the groin to achieve hemostasis.  The patient left lab in stable condition.  Dorn Lesches. MD, [facility] 06/01/2021 2:17 PM  Findings Coronary  Findings Diagnostic  Dominance: Right  Left Main There is severe diffuse disease throughout the vessel. Mid LM to Dist LM lesion is 99% stenosed.  Left Anterior Descending Vessel is small. There is severe diffuse disease throughout the vessel. Prox LAD lesion is 100% stenosed.  Left Circumflex Ost Cx to Prox Cx lesion is 100% stenosed.  Right Coronary Artery There is moderate diffuse disease throughout the vessel. Ost RCA to Prox RCA lesion is 60% stenosed. Mid RCA lesion is 15% stenosed. The lesion was previously treated . Non-stenotic Mid RCA to Dist RCA lesion was previously treated. Dist RCA lesion is 40% stenosed.  Graft To RPDA Origin lesion is 100% stenosed.  Graft To 3rd Mrg  Graft To 1st Diag  LIMA Graft To Dist LAD  Intervention  No interventions have been documented.   CARDIAC CATHETERIZATION  CARDIAC CATHETERIZATION 05/08/2021  Conclusion   Occlusion of saphenous vein graft to right coronary  Patent saphenous vein graft to diagonal   Patent free left radial to circumflex   Patent LIMA to LAD   Total occlusion of proximal circumflex   99% distal left main   Total occlusion of proximal to mid LAD   Native RCA heavily calcified with ostial to distal disease.  Focal 90% stenosis within a diffusely diseased segment felt to be culprit for angina.  TIMI grade III flow noted.   Successful high risk RCA PCI complicated by balloon rupture and spiral dissection treated with overlapping 2.5 mm drug-eluting stents 12 mm x 2.5 distal overlap with a 22 x 2.5 mm from distal to mid vessel.  TIMI grade III flow.  Residual stenosis at the overlap site approximately 20%.  Aspirin  and Plavix  for at least 12 months then monotherapy with Plavix  thereafter. Bivalirudin  for 2 hours before discontinuation.  Half dose infusion. Preventive therapy per treating team. At risk for development of femoral bleed.  Needs to be followed closely since sheath will not be pulled in the Cath  Lab.  Findings Coronary Findings Diagnostic  Dominance: Right  Left Main There is severe diffuse disease throughout the vessel. Mid LM to Dist LM lesion is 99% stenosed.  Left Anterior Descending Vessel is small. There is severe diffuse disease throughout the vessel. Prox LAD lesion is 100% stenosed.  Left Circumflex Ost Cx to Prox Cx lesion is 100% stenosed.  Right Coronary Artery There is moderate diffuse disease throughout the vessel. Ost RCA to Prox RCA lesion is 60% stenosed. Mid RCA lesion is 90% stenosed. Mid RCA to Dist RCA lesion is 40% stenosed.  Graft To RPDA Origin lesion is 100% stenosed.  Graft To 3rd Mrg  Graft To 1st Diag  LIMA Graft To Dist LAD  Intervention  Mid RCA lesion Stent (Also treats lesions: Mid RCA to Dist RCA) A stent was successfully placed. Post-Intervention Lesion Assessment The intervention was successful. There is a 15% residual stenosis post intervention.  Mid RCA to Dist RCA lesion Stent (Also treats lesions: Mid RCA) See details in Mid RCA lesion. Post-Intervention Lesion Assessment The intervention was successful. There is a 0% residual stenosis post intervention.     ECHOCARDIOGRAM  ECHOCARDIOGRAM COMPLETE 05/08/2021  Narrative ECHOCARDIOGRAM REPORT    Patient Name:   Shawn Bauer [facility] Date of Exam: 05/08/2021 Medical Rec #:  [id number]      Height:       71.0 in Accession #:    [id number]     Weight:       140.9 lb Date of Birth:  08-28-1934     BSA:          1.817 m Patient Age:    86 years       BP:           122/66 mmHg Patient Gender: M              HR:           76 bpm. Exam Location:  Inpatient  Procedure: 2D Echo, Cardiac Doppler and Color Doppler  Indications:    Ventricular tachycardia  History:        Patient has prior history of Echocardiogram examinations, most recent 04/04/2020. CAD, Prior CABG, Arrythmias:Atrial Fibrillation and ablation, Signs/Symptoms:Chest Pain and CKD; Risk  Factors:Hypertension.  Sonographer:    Lyle Marc RDCS Referring Phys: [phone] [address]  IMPRESSIONS   1. Left ventricular ejection fraction, by estimation, is 55 to 60%. The left ventricle has normal function. The  left ventricle demonstrates global hypokinesis. There is mild concentric left ventricular hypertrophy. Left ventricular diastolic parameters are consistent with Grade I diastolic dysfunction (impaired relaxation). 2. Right ventricular systolic function is normal. The right ventricular size is normal. There is normal pulmonary artery systolic pressure. 3. The mitral valve is grossly normal. No evidence of mitral valve regurgitation. No evidence of mitral stenosis. 4. The aortic valve is tricuspid. Aortic valve regurgitation is not visualized. No aortic stenosis is present. 5. The inferior vena cava is normal in size with greater than 50% respiratory variability, suggesting right atrial pressure of 3 mmHg.  Comparison(s): A prior study was performed on 04/04/2020. PVCs more frequent during exam.  FINDINGS Left Ventricle: Left ventricular ejection fraction, by estimation, is 55 to 60%. The left ventricle has normal function. The left ventricle demonstrates global hypokinesis. The left ventricular internal cavity size was normal in size. There is mild concentric left ventricular hypertrophy. Left ventricular diastolic parameters are consistent with Grade I diastolic dysfunction (impaired relaxation).  Right Ventricle: The right ventricular size is normal. No increase in right ventricular wall thickness. Right ventricular systolic function is normal. There is normal pulmonary artery systolic pressure. The tricuspid regurgitant velocity is 2.12 m/s, and with an assumed right atrial pressure of 3 mmHg, the estimated right ventricular systolic pressure is 21.0 mmHg.  Left Atrium: Left atrial size was normal in size.  Right Atrium: Right atrial size was normal in  size.  Pericardium: There is no evidence of pericardial effusion.  Mitral Valve: The mitral valve is grossly normal. No evidence of mitral valve regurgitation. No evidence of mitral valve stenosis.  Tricuspid Valve: The tricuspid valve is grossly normal. Tricuspid valve regurgitation is trivial.  Aortic Valve: The aortic valve is tricuspid. Aortic valve regurgitation is not visualized. No aortic stenosis is present.  Pulmonic Valve: The pulmonic valve was not well visualized. Pulmonic valve regurgitation is mild. No evidence of pulmonic stenosis.  Aorta: The aortic root is normal in size and structure and the ascending aorta was not well visualized.  Venous: The inferior vena cava is normal in size with greater than 50% respiratory variability, suggesting right atrial pressure of 3 mmHg.  IAS/Shunts: The atrial septum is grossly normal.   LEFT VENTRICLE PLAX 2D LVIDd:         4.10 cm  Diastology LVIDs:         3.30 cm  LV e' medial:    6.20 cm/s LV PW:         1.20 cm  LV E/e' medial:  12.9 LV IVS:        1.20 cm  LV e' lateral:   8.81 cm/s LVOT diam:     2.10 cm  LV E/e' lateral: 9.1 LV SV:         51 LV SV Index:   28 LVOT Area:     3.46 cm   RIGHT VENTRICLE RV Basal diam:  3.20 cm RV S prime:     3.92 cm/s TAPSE (M-mode): 1.5 cm  LEFT ATRIUM             Index       RIGHT ATRIUM           Index LA diam:        3.60 cm 1.98 cm/m  RA Area:     19.40 cm LA Vol (A2C):   30.1 ml 16.57 ml/m RA Volume:   54.00 ml  29.72 ml/m LA Vol (A4C):   41.0 ml  22.57 ml/m LA Biplane Vol: 38.3 ml 21.08 ml/m AORTIC VALVE LVOT Vmax:   64.60 cm/s LVOT Vmean:  41.400 cm/s LVOT VTI:    0.148 m  AORTA Ao Root diam: 3.30 cm  MITRAL VALVE               TRICUSPID VALVE MV Area (PHT): 3.93 cm    TR Peak grad:   18.0 mmHg MV Decel Time: 193 msec    TR Vmax:        212.00 cm/s MV E velocity: 80.10 cm/s MV A velocity: 66.40 cm/s  SHUNTS MV E/A ratio:  1.21        Systemic VTI:  0.15  m Systemic Diam: 2.10 cm  Stanly Leavens MD Electronically signed by Stanly Leavens MD Signature Date/Time: 05/08/2021/1:24:14 PM    Final    MONITORS  LONG TERM MONITOR-LIVE TELEMETRY (3-14 DAYS) 06/09/2021  Narrative Patch Wear Time:  14 days and 0 hours (2022-08-19T10:34:56-0400 to 2022-09-02T10:34:56-0400)  Patient had a min HR of 33 bpm, max HR of 226 bpm, and avg HR of 71 bpm. Predominant underlying rhythm was Sinus Rhythm. First Degree AV Block was present. 31 Ventricular Tachycardia runs occurred, the run with the fastest interval lasting 10 beats with a max rate of 226 bpm, the longest lasting 28.0 secs with an avg rate of 159 bpm. 133 Supraventricular Tachycardia runs occurred, the run with the fastest interval lasting 12 beats with a max rate of 145 bpm, the longest lasting 16 beats with an avg rate of 80 bpm. Some episodes of Supraventricular Tachycardia may be possible Atrial Tachycardia with variable block. Second Degree AV Block-Mobitz I (Wenckebach) was present. Isolated SVEs were rare (<1.0%), SVE Couplets were rare (<1.0%), and SVE Triplets were rare (<1.0%). Isolated VEs were frequent (8.0%, [id number]), VE Couplets were rare (<1.0%, 1830), and VE Triplets were rare (<1.0%, 389). Ventricular Bigeminy and Trigeminy were present. MD notification criteria for Ventricular Tachycardia met - Notified GLENWOOD Porter HERO on 06/08/2021 at 8:29 AM CDT Lorrayne Lunger).  SUMMARY: The basic rhythm is normal sinus with an average heart rate of 71 bpm There is no atrial fibrillation or flutter There are frequent PVC's and nonsustained ventricular runs lasting up to 28 seconds associated with symptoms There are rare supraventricular beats and supraventricular runs  Pt has been hospitalized and has undergone extensive evaluation and treatment since this monitor was completed.       ______________________________________________________________________________________________       EKG:        Recent Labs: 12/16/2023: TSH 1.870 07/02/2024: ALT 19; BUN 22; Creatinine 1.75; Hemoglobin 10.6; Platelet Count 188; Potassium 4.0; Sodium 143  Recent Lipid Panel    Component Value Date/Time   CHOL 114 05/08/2021 0645   TRIG 48 05/08/2021 0645   HDL 35 (L) 05/08/2021 0645   CHOLHDL 3.3 05/08/2021 0645   VLDL 10 05/08/2021 0645   LDLCALC 69 05/08/2021 0645   LDLDIRECT 59 12/16/2023 1518          Physical Exam:    VS:  BP (!) 155/68   Pulse 67   Ht 5' 10 (1.778 m)   Wt 136 lb 14.4 oz (62.1 kg)   SpO2 95%   BMI 19.64 kg/m     Wt Readings from Last 3 Encounters:  07/22/24 136 lb 14.4 oz (62.1 kg)  07/02/24 137 lb 4 oz (62.3 kg)  05/15/24 133 lb 6.4 oz (60.5 kg)     GEN: Thin elderly male in no acute distress  HEENT: Normal NECK: No JVD; No carotid bruits LYMPHATICS: No lymphadenopathy CARDIAC: RRR, no murmurs, rubs, gallops RESPIRATORY:  Clear to auscultation without rales, wheezing or rhonchi  ABDOMEN: Soft, non-tender, non-distended MUSCULOSKELETAL:  No edema; No deformity  SKIN: Warm and dry NEUROLOGIC:  Alert and oriented x 3 PSYCHIATRIC:  Normal affect   Assessment & Plan Essential hypertension Blood pressure elevated today but normally well-controlled.  He states that it was hard for him to walk into the office today and he is sure that is why his blood pressure is up.  Continue metoprolol  succinate at the current dose. Coronary artery disease involving native coronary artery of native heart without angina pectoris Stable on clopidogrel , atorvastatin , and metoprolol  succinate.  No anginal symptoms at present.  I refilled his sublingual nitroglycerin  to be used as needed. Mixed hyperlipidemia Treated with atorvastatin .  Last lipids earlier this year with an LDL cholesterol of 59. NSVT (nonsustained ventricular tachycardia) (HCC) Controlled with amiodarone .  No further symptoms of arrhythmia noted.  Last echo reviewed with LVEF 55 to 60%.             Medication Adjustments/Labs and Tests Ordered: Current medicines are reviewed at length with the patient today.  Concerns regarding medicines are outlined above.  No orders of the defined types were placed in this encounter.  Meds ordered this encounter  Medications   nitroGLYCERIN  (NITROSTAT ) 0.4 MG SL tablet    Sig: Place 1 tablet (0.4 mg total) under the tongue every 5 (five) minutes as needed for chest pain.    Dispense:  25 tablet    Refill:  3    Patient Instructions  Medication Instructions:  Your physician recommends that you continue on your current medications as directed. Please refer to the Current Medication list given to you today.  *If you need a refill on your cardiac medications before your next appointment, please call your pharmacy*  Follow-Up: At [facility], you and your health needs are our priority.  As part of our continuing mission to provide you with exceptional heart care, our providers are all part of one team.  This team includes your primary Cardiologist (physician) and Advanced Practice Providers or APPs (Physician Assistants and Nurse Practitioners) who all work together to provide you with the care you need, when you need it.  Your next appointment:   6 month(s)  Provider:   One of our Advanced Practice Providers (APPs): Morse Clause, PA-C  Lamarr Satterfield, NP Miriam Shams, NP  Olivia Pavy, PA-C Josefa Beauvais, NP  Leontine Salen, PA-C Orren Fabry, PA-C  [place], PA-C Ernest Dick, NP  Damien Braver, NP Jon Hails, PA-C  Waddell Donath, PA-C    Dayna Dunn, PA-C  Scott Weaver, PA-C Lum Louis, NP Katlyn West, NP Callie Goodrich, PA-C  Xika Zhao, NP Sheng Haley, PA-C    Kathleen Johnson, PA-C   Then, Ozell Fell, MD will plan to see you again in 1 year(s).  We recommend signing up for the patient portal called MyChart.  Sign up information is provided on this After Visit Summary.  MyChart is used to connect  with patients for Virtual Visits (Telemedicine).  Patients are able to view lab/test results, encounter notes, upcoming appointments, etc.  Non-urgent messages can be sent to your provider as well.    To learn more about what you can do with MyChart, go to [email or web address].   Signed, Ozell Fell, MD  07/22/2024 4:50 PM     [facility]

## 2024-07-22 NOTE — Assessment & Plan Note (Signed)
 Treated with atorvastatin .  Last lipids earlier this year with an LDL cholesterol of 59.

## 2024-07-22 NOTE — Assessment & Plan Note (Signed)
 Controlled with amiodarone .  No further symptoms of arrhythmia noted.  Last echo reviewed with LVEF 55 to 60%.

## 2024-07-22 NOTE — Patient Instructions (Signed)
 Medication Instructions:  Your physician recommends that you continue on your current medications as directed. Please refer to the Current Medication list given to you today.  *If you need a refill on your cardiac medications before your next appointment, please call your pharmacy*  Follow-Up: At [facility], you and your health needs are our priority.  As part of our continuing mission to provide you with exceptional heart care, our providers are all part of one team.  This team includes your primary Cardiologist (physician) and Advanced Practice Providers or APPs (Physician Assistants and Nurse Practitioners) who all work together to provide you with the care you need, when you need it.  Your next appointment:   6 month(s)  Provider:   One of our Advanced Practice Providers (APPs): Morse Clause, PA-C  Lamarr Satterfield, NP Miriam Shams, NP  Olivia Pavy, PA-C Josefa Beauvais, NP  Leontine Salen, PA-C Orren Fabry, PA-C  [place], PA-C Ernest Dick, NP  Damien Braver, NP Jon Hails, PA-C  Waddell Donath, PA-C    Dayna Dunn, PA-C  Scott Weaver, PA-C Lum Louis, NP Katlyn West, NP Callie Goodrich, PA-C  Xika Zhao, NP Sheng Haley, PA-C    Kathleen Owen Pratte, PA-C   Then, Ozell Fell, MD will plan to see you again in 1 year(s).  We recommend signing up for the patient portal called MyChart.  Sign up information is provided on this After Visit Summary.  MyChart is used to connect with patients for Virtual Visits (Telemedicine).  Patients are able to view lab/test results, encounter notes, upcoming appointments, etc.  Non-urgent messages can be sent to your provider as well.    To learn more about what you can do with MyChart, go to [email or web address].

## 2024-07-27 ENCOUNTER — Other Ambulatory Visit: Payer: Self-pay | Admitting: Hematology & Oncology

## 2024-08-26 ENCOUNTER — Inpatient Hospital Stay: Admitting: Medical Oncology

## 2024-08-26 ENCOUNTER — Ambulatory Visit: Payer: Self-pay | Admitting: Medical Oncology

## 2024-08-26 ENCOUNTER — Encounter: Payer: Self-pay | Admitting: Medical Oncology

## 2024-08-26 ENCOUNTER — Inpatient Hospital Stay: Attending: Family

## 2024-08-26 ENCOUNTER — Ambulatory Visit

## 2024-08-26 ENCOUNTER — Ambulatory Visit (HOSPITAL_BASED_OUTPATIENT_CLINIC_OR_DEPARTMENT_OTHER)
Admission: RE | Admit: 2024-08-26 | Discharge: 2024-08-26 | Disposition: A | Discharge status: Home / Self Care | Source: Ambulatory Visit | Attending: Medical Oncology | Admitting: Medical Oncology

## 2024-08-26 ENCOUNTER — Inpatient Hospital Stay

## 2024-08-26 VITALS — BP 132/54 | HR 70 | Temp 98.2°F | Resp 19 | Ht 70.0 in | Wt 128.0 lb

## 2024-08-26 DIAGNOSIS — G629 Polyneuropathy, unspecified: Secondary | ICD-10-CM | POA: Diagnosis not present

## 2024-08-26 DIAGNOSIS — M25571 Pain in right ankle and joints of right foot: Secondary | ICD-10-CM | POA: Insufficient documentation

## 2024-08-26 DIAGNOSIS — Z79899 Other long term (current) drug therapy: Secondary | ICD-10-CM | POA: Diagnosis not present

## 2024-08-26 DIAGNOSIS — C9000 Multiple myeloma not having achieved remission: Secondary | ICD-10-CM | POA: Insufficient documentation

## 2024-08-26 LAB — CBC WITH DIFFERENTIAL (CANCER CENTER ONLY)
Abs Immature Granulocytes: 0.03 K/uL (ref 0.00–0.07)
Basophils Absolute: 0.1 K/uL (ref 0.0–0.1)
Basophils Relative: 1 %
Eosinophils Absolute: 0.4 K/uL (ref 0.0–0.5)
Eosinophils Relative: 5 %
HCT: 34.6 % — ABNORMAL LOW (ref 39.0–52.0)
Hemoglobin: 11.1 g/dL — ABNORMAL LOW (ref 13.0–17.0)
Immature Granulocytes: 0 %
Lymphocytes Relative: 19 %
Lymphs Abs: 1.6 K/uL (ref 0.7–4.0)
MCH: 30.3 pg (ref 26.0–34.0)
MCHC: 32.1 g/dL (ref 30.0–36.0)
MCV: 94.5 fL (ref 80.0–100.0)
Monocytes Absolute: 0.9 K/uL (ref 0.1–1.0)
Monocytes Relative: 11 %
Neutro Abs: 5.3 K/uL (ref 1.7–7.7)
Neutrophils Relative %: 64 %
Platelet Count: 196 K/uL (ref 150–400)
RBC: 3.66 MIL/uL — ABNORMAL LOW (ref 4.22–5.81)
RDW: 15 % (ref 11.5–15.5)
WBC Count: 8.3 K/uL (ref 4.0–10.5)
nRBC: 0 % (ref 0.0–0.2)

## 2024-08-26 LAB — CMP (CANCER CENTER ONLY)
ALT: 15 U/L (ref 0–44)
AST: 23 U/L (ref 15–41)
Albumin: 3.4 g/dL — ABNORMAL LOW (ref 3.5–5.0)
Alkaline Phosphatase: 85 U/L (ref 38–126)
Anion gap: 10 (ref 5–15)
BUN: 19 mg/dL (ref 8–23)
CO2: 25 mmol/L (ref 22–32)
Calcium: 8.9 mg/dL (ref 8.9–10.3)
Chloride: 107 mmol/L (ref 98–111)
Creatinine: 1.66 mg/dL — ABNORMAL HIGH (ref 0.61–1.24)
GFR, Estimated: 39 mL/min — ABNORMAL LOW (ref >60)
Glucose, Bld: 118 mg/dL — ABNORMAL HIGH (ref 70–99)
Potassium: 4 mmol/L (ref 3.5–5.1)
Sodium: 142 mmol/L (ref 135–145)
Total Bilirubin: 0.5 mg/dL (ref 0.0–1.2)
Total Protein: 6 g/dL — ABNORMAL LOW (ref 6.5–8.1)

## 2024-08-26 LAB — LACTATE DEHYDROGENASE: LDH: 214 U/L (ref 105–235)

## 2024-08-26 NOTE — Progress Notes (Signed)
 Treatment 6 weeks Hematology and Oncology Follow Up Visit  Shawn Bauer [facility] [id number] 27-Dec-1933 89 y.o. 08/26/2024   Principle Diagnosis:  IgG Kappa myeloma -- high risk due to t(4:21)    Prior Treatment:  Velcade /Decadron  -- started 10/09/2019, s/p cycle 34 - treatment frequency changed to every other week on 01/21/2021 now s/p cycle 20 --HOLD TREATMENT STARTING 07/29/2023  Current Therapy:        Observation Xgeva  120 mg IM q 3 months -- next dose on 08/2024       Interim History:  Mr. Juba is here today for follow-up.   He is here with his granddaughter. They report that he has had a difficult time recently. They state that he was walking and rolled his ankle about a week ago which resulted in a fall. Then he fell again the next day and this time hit his lip on a counter. He cut his lip and this bled for hours before finally stopping. Since then the pain in his right ankle has been stable until today when he slipped on some ice. Now it is hurting a bit more. No bleeding or other injury.   He chronically has some neuropathy which causes occasional falls. Currently he is using a rolling walker for stability. He is also getting PT in house for stability. He has been trialed on Cymbalta  30 mg p.o. twice daily to see if this helped with the neuropathy.   His appetite is good.  He has had no problems with nausea or vomiting.  He has had no problems with bowels or bladder.  He has had no bleeding.  He has had no fever.  He has had no cough.  When we last saw him, there was no monoclonal spike in his blood.  His IgG level was 992 mg/dL.  The Kappa light chain was 15.8 mg/dL.  Overall, I would say that his performance status is probably ECOG 2.  Wt Readings from Last 3 Encounters:  08/26/24 128 lb (58.1 kg)  07/22/24 136 lb 14.4 oz (62.1 kg)  07/02/24 137 lb 4 oz (62.3 kg)   Medications:  Allergies as of 08/26/2024   No Known Allergies      Medication List        Accurate as of  August 26, 2024 10:33 AM. If you have any questions, ask your nurse or doctor.          amiodarone  200 MG tablet Commonly known as: PACERONE  Take 1 tablet (200 mg total) by mouth as directed. Take 200mg  daily Monday - Friday only.   atorvastatin  40 MG tablet Commonly known as: LIPITOR TAKE 1 TABLET BY MOUTH EVERY DAY   Breo Ellipta 200-25 MCG/ACT Aepb Generic drug: fluticasone furoate-vilanterol 1 puff daily.   CINNAMON PO Take 1,000 mg by mouth daily.   clopidogrel  75 MG tablet Commonly known as: PLAVIX  TAKE 1 TABLET BY MOUTH EVERY DAY WITH BREAKFAST   cyanocobalamin  1000 MCG tablet Take 1,000 mcg by mouth daily.   DULoxetine  30 MG capsule Commonly known as: CYMBALTA  TAKE 1 CAPSULE BY MOUTH 2 TIMES DAILY.   gabapentin  400 MG capsule Commonly known as: NEURONTIN  Take 1 capsule (400 mg total) by mouth at bedtime.   metoprolol  succinate 50 MG 24 hr tablet Commonly known as: TOPROL -XL Take 25 mg by mouth daily.   nitroGLYCERIN  0.4 MG SL tablet Commonly known as: NITROSTAT  Place 1 tablet (0.4 mg total) under the tongue every 5 (five) minutes as needed for chest pain.  olmesartan 40 MG tablet Commonly known as: BENICAR Take 40 mg by mouth in the morning.   polyethylene glycol 17 g packet Commonly known as: MIRALAX / GLYCOLAX Take 17 g by mouth daily.   promethazine  12.5 MG tablet Commonly known as: PHENERGAN  TAKE 1 TABLET (12.5 MG TOTAL) BY MOUTH EVERY 8 HOURS AS NEEDED FOR NAUSEA AND VOMITING   traMADol  50 MG tablet Commonly known as: ULTRAM  Take 50 mg by mouth 2 (two) times daily as needed (for pain).   Vitamin B6 250 MG Tabs Take 2 tablets (500 mg total) by mouth daily after breakfast.        Allergies: No Known Allergies  Past Medical History, Surgical history, Social history, and Family History were reviewed and updated.  Review of Systems:  Review of Systems  Constitutional: Negative.   HENT: Negative.    Eyes: Negative.   Respiratory:  Negative.    Cardiovascular: Negative.   Gastrointestinal: Negative.   Genitourinary: Negative.   Musculoskeletal: Negative.   Skin: Negative.   Neurological: Negative.   Endo/Heme/Allergies: Negative.   Psychiatric/Behavioral: Negative.       Physical Exam:  Vitals:   08/26/24 0947  BP: (!) 132/54  Pulse: 70  Resp: 19  Temp: 98.2 F (36.8 C)  SpO2: 100%     Wt Readings from Last 3 Encounters:  08/26/24 128 lb (58.1 kg)  07/22/24 136 lb 14.4 oz (62.1 kg)  07/02/24 137 lb 4 oz (62.3 kg)   Physical Exam Vitals reviewed.  HENT:     Head: Normocephalic and atraumatic.  Eyes:     Pupils: Pupils are equal, round, and reactive to light.  Cardiovascular:     Rate and Rhythm: Normal rate and regular rhythm.     Heart sounds: Normal heart sounds.  Pulmonary:     Effort: Pulmonary effort is normal.     Breath sounds: Normal breath sounds.  Abdominal:     General: Bowel sounds are normal.     Palpations: Abdomen is soft.  Musculoskeletal:        General: Tenderness (right ankle) and signs of injury (Resolving 1 inch round area of ecchymosis of the lateral right foot near ankle) present. No swelling or deformity. Normal range of motion.     Cervical back: Normal range of motion.     Right lower leg: No edema.     Left lower leg: No edema.  Lymphadenopathy:     Cervical: No cervical adenopathy.  Skin:    General: Skin is warm and dry.     Findings: No erythema or rash.  Neurological:     Mental Status: He is alert and oriented to person, place, and time.  Psychiatric:        Behavior: Behavior normal.        Thought Content: Thought content normal.        Judgment: Judgment normal.      Lab Results  Component Value Date   WBC 8.3 08/26/2024   HGB 11.1 (L) 08/26/2024   HCT 34.6 (L) 08/26/2024   MCV 94.5 08/26/2024   PLT 196 08/26/2024   Lab Results  Component Value Date   FERRITIN 11 (L) 04/12/2022   IRON  36 (L) 04/12/2022   TIBC 330 04/12/2022   UIBC  294 04/12/2022   IRONPCTSAT 11 (L) 04/12/2022   Lab Results  Component Value Date   RETICCTPCT 1.9 04/12/2022   RBC 3.66 (L) 08/26/2024   Lab Results  Component Value Date   KPAFRELGTCHN  158.2 (H) 07/02/2024   LAMBDASER 20.9 07/02/2024   KAPLAMBRATIO 7.57 (H) 07/02/2024   Lab Results  Component Value Date   IGGSERUM 992 07/02/2024   IGA 253 07/02/2024   IGMSERUM 57 07/02/2024   Lab Results  Component Value Date   TOTALPROTELP 5.8 (L) 07/02/2024   ALBUMINELP 2.9 07/02/2024   A1GS 0.3 07/02/2024   A2GS 0.8 07/02/2024   BETS 0.8 07/02/2024   GAMS 1.0 07/02/2024   MSPIKE Not Observed 07/02/2024   SPEI Comment 06/11/2023     Chemistry      Component Value Date/Time   NA 142 08/26/2024 0929   K 4.0 08/26/2024 0929   CL 107 08/26/2024 0929   CO2 25 08/26/2024 0929   BUN 19 08/26/2024 0929   CREATININE 1.66 (H) 08/26/2024 0929      Component Value Date/Time   CALCIUM  8.9 08/26/2024 0929   ALKPHOS 85 08/26/2024 0929   AST 23 08/26/2024 0929   ALT 15 08/26/2024 0929   BILITOT 0.5 08/26/2024 0929     Encounter Diagnosis  Name Primary?   Acute right ankle pain Yes   Impression and Plan: Mr. Badal is a very pleasant 89 yo caucasian gentleman with IgG kappa myeloma.  He has been off treatment now for about 12 months.  He has done incredibly well off treatment with significant improvement in his quality of life. His neuropathy is less of a trouble for him with the Cymbalta  but he does need stability with a rolling walker.   CBC stable today despite recent bleeding Fall precautions reviewed X ray of ankle today- RICE, ankle brace- Orthopedics if worsening or not improving over 6-8 week timeframe XGEVA  due next month.   RTC 1 month MD/APP, labs(CBC w/, CMP, LDH, MM panel, light chains), infusion (Xgeva )  Lauraine CHRISTELLA Dais, PA-C 12/3/202510:33 AM

## 2024-08-26 NOTE — Progress Notes (Unsigned)
   Subjective:    Patient ID: Shawn Bauer Lipoma, male    DOB: 89 y.o., Apr 07, 1934   MRN: [id number]  Chief Complaint: ***  Discussed the use of AI scribe software for clinical note transcription with the patient, who gave verbal consent to proceed.  History of Present Illness        Objective:   There were no vitals filed for this visit.  ***     Assessment & Plan:   Assessment & Plan

## 2024-08-27 ENCOUNTER — Ambulatory Visit

## 2024-08-27 LAB — KAPPA/LAMBDA LIGHT CHAINS
Kappa free light chain: 146.5 mg/L — ABNORMAL HIGH (ref 3.3–19.4)
Kappa, lambda light chain ratio: 9.83 — ABNORMAL HIGH (ref 0.26–1.65)
Lambda free light chains: 14.9 mg/L (ref 5.7–26.3)

## 2024-08-27 LAB — IGG, IGA, IGM
IgA: 276 mg/dL (ref 61–437)
IgG (Immunoglobin G), Serum: 934 mg/dL (ref 603–1613)
IgM (Immunoglobulin M), Srm: 46 mg/dL (ref 15–143)

## 2024-08-30 LAB — PROTEIN ELECTROPHORESIS, SERUM, WITH REFLEX
A/G Ratio: 1 (ref 0.7–1.7)
Albumin ELP: 2.9 g/dL (ref 2.9–4.4)
Alpha-1-Globulin: 0.3 g/dL (ref 0.0–0.4)
Alpha-2-Globulin: 0.8 g/dL (ref 0.4–1.0)
Beta Globulin: 0.8 g/dL (ref 0.7–1.3)
Gamma Globulin: 0.9 g/dL (ref 0.4–1.8)
Globulin, Total: 2.8 g/dL (ref 2.2–3.9)
Total Protein ELP: 5.7 g/dL — ABNORMAL LOW (ref 6.0–8.5)

## 2024-09-11 ENCOUNTER — Other Ambulatory Visit: Payer: Self-pay | Admitting: Orthopaedic Surgery

## 2024-09-11 DIAGNOSIS — S82831A Other fracture of upper and lower end of right fibula, initial encounter for closed fracture: Secondary | ICD-10-CM

## 2024-09-18 ENCOUNTER — Ambulatory Visit
Admission: RE | Admit: 2024-09-18 | Discharge: 2024-09-18 | Disposition: A | Discharge status: Home / Self Care | Source: Ambulatory Visit | Attending: Orthopaedic Surgery | Admitting: Orthopaedic Surgery

## 2024-09-18 DIAGNOSIS — S82831A Other fracture of upper and lower end of right fibula, initial encounter for closed fracture: Secondary | ICD-10-CM

## 2024-10-01 ENCOUNTER — Other Ambulatory Visit: Payer: Self-pay

## 2024-10-01 DIAGNOSIS — C9000 Multiple myeloma not having achieved remission: Secondary | ICD-10-CM

## 2024-10-02 ENCOUNTER — Inpatient Hospital Stay: Admitting: Hematology & Oncology

## 2024-10-02 ENCOUNTER — Encounter: Payer: Self-pay | Admitting: Hematology & Oncology

## 2024-10-02 ENCOUNTER — Inpatient Hospital Stay

## 2024-10-02 ENCOUNTER — Inpatient Hospital Stay: Attending: Family

## 2024-10-02 ENCOUNTER — Other Ambulatory Visit: Payer: Self-pay

## 2024-10-02 VITALS — BP 152/53 | HR 63 | Temp 97.9°F | Resp 16 | Ht 70.0 in | Wt 130.0 lb

## 2024-10-02 DIAGNOSIS — C9000 Multiple myeloma not having achieved remission: Secondary | ICD-10-CM | POA: Diagnosis not present

## 2024-10-02 DIAGNOSIS — N183 Chronic kidney disease, stage 3 unspecified: Secondary | ICD-10-CM

## 2024-10-02 LAB — CMP (CANCER CENTER ONLY)
ALT: 19 U/L (ref 0–44)
AST: 22 U/L (ref 15–41)
Albumin: 3.8 g/dL (ref 3.5–5.0)
Alkaline Phosphatase: 96 U/L (ref 38–126)
Anion gap: 9 (ref 5–15)
BUN: 19 mg/dL (ref 8–23)
CO2: 28 mmol/L (ref 22–32)
Calcium: 9.1 mg/dL (ref 8.9–10.3)
Chloride: 106 mmol/L (ref 98–111)
Creatinine: 1.55 mg/dL — ABNORMAL HIGH (ref 0.61–1.24)
GFR, Estimated: 43 mL/min — ABNORMAL LOW
Glucose, Bld: 103 mg/dL — ABNORMAL HIGH (ref 70–99)
Potassium: 4.6 mmol/L (ref 3.5–5.1)
Sodium: 143 mmol/L (ref 135–145)
Total Bilirubin: 0.5 mg/dL (ref 0.0–1.2)
Total Protein: 6.1 g/dL — ABNORMAL LOW (ref 6.5–8.1)

## 2024-10-02 LAB — CBC WITH DIFFERENTIAL (CANCER CENTER ONLY)
Abs Immature Granulocytes: 0.02 K/uL (ref 0.00–0.07)
Basophils Absolute: 0.1 K/uL (ref 0.0–0.1)
Basophils Relative: 1 %
Eosinophils Absolute: 0.4 K/uL (ref 0.0–0.5)
Eosinophils Relative: 6 %
HCT: 36.4 % — ABNORMAL LOW (ref 39.0–52.0)
Hemoglobin: 11.5 g/dL — ABNORMAL LOW (ref 13.0–17.0)
Immature Granulocytes: 0 %
Lymphocytes Relative: 30 %
Lymphs Abs: 2.2 K/uL (ref 0.7–4.0)
MCH: 31.3 pg (ref 26.0–34.0)
MCHC: 31.6 g/dL (ref 30.0–36.0)
MCV: 98.9 fL (ref 80.0–100.0)
Monocytes Absolute: 0.8 K/uL (ref 0.1–1.0)
Monocytes Relative: 11 %
Neutro Abs: 3.9 K/uL (ref 1.7–7.7)
Neutrophils Relative %: 52 %
Platelet Count: 186 K/uL (ref 150–400)
RBC: 3.68 MIL/uL — ABNORMAL LOW (ref 4.22–5.81)
RDW: 15.5 % (ref 11.5–15.5)
WBC Count: 7.4 K/uL (ref 4.0–10.5)
nRBC: 0 % (ref 0.0–0.2)

## 2024-10-02 LAB — LACTATE DEHYDROGENASE: LDH: 185 U/L (ref 105–235)

## 2024-10-02 MED ORDER — DENOSUMAB 120 MG/1.7ML ~~LOC~~ SOLN
120.0000 mg | Freq: Once | SUBCUTANEOUS | Status: AC
  Administered 2024-10-02: 120 mg via SUBCUTANEOUS
  Filled 2024-10-02: qty 1.7

## 2024-10-02 NOTE — Patient Instructions (Signed)
 Denosumab  Injection (Oncology) What is this medication? DENOSUMAB  (den oh SUE mab) prevents weakened bones caused by cancer. It may also be used to treat noncancerous bone tumors that cannot be removed by surgery. It can also be used to treat high calcium  levels in the blood caused by cancer. It works by blocking a protein that causes bones to break down quickly. This slows down the release of calcium  from bones, which lowers calcium  levels in your blood. It also makes your bones stronger and less likely to break (fracture). This medicine may be used for other purposes; ask your health care provider or pharmacist if you have questions. COMMON BRAND NAME(S): XGEVA  What should I tell my care team before I take this medication? They need to know if you have any of these conditions: Dental disease Having surgery or tooth extraction Infection Kidney disease Low levels of calcium  or vitamin D  in the blood Malnutrition On hemodialysis Skin conditions or sensitivity Thyroid  or parathyroid disease An unusual reaction to denosumab , other medications, foods, dyes, or preservatives Pregnant or trying to get pregnant Breast-feeding How should I use this medication? This medication is for injection under the skin. It is given by your care team in a hospital or clinic setting. A special MedGuide will be given to you before each treatment. Be sure to read this information carefully each time. Talk to your care team about the use of this medication in children. While it may be prescribed for children as young as 13 years for selected conditions, precautions do apply. Overdosage: If you think you have taken too much of this medicine contact a poison control center or emergency room at once. NOTE: This medicine is only for you. Do not share this medicine with others. What if I miss a dose? Keep appointments for follow-up doses. It is important not to miss your dose. Call your care team if you are unable to  keep an appointment. What may interact with this medication? Do not take this medication with any of the following: Other medications containing denosumab  This medication may also interact with the following: Medications that lower your chance of fighting infection Steroid medications, such as prednisone  or cortisone This list may not describe all possible interactions. Give your health care provider a list of all the medicines, herbs, non-prescription drugs, or dietary supplements you use. Also tell them if you smoke, drink alcohol, or use illegal drugs. Some items may interact with your medicine. What should I watch for while using this medication? Your condition will be monitored carefully while you are receiving this medication. You may need blood work while taking this medication. This medication may increase your risk of getting an infection. Call your care team for advice if you get a fever, chills, sore throat, or other symptoms of a cold or flu. Do not treat yourself. Try to avoid being around people who are sick. You should make sure you get enough calcium  and vitamin D  while you are taking this medication, unless your care team tells you not to. Discuss the foods you eat and the vitamins you take with your care team. Some people who take this medication have severe bone, joint, or muscle pain. This medication may also increase your risk for jaw problems or a broken thigh bone. Tell your care team right away if you have severe pain in your jaw, bones, joints, or muscles. Tell your care team if you have any pain that does not go away or that gets worse. Talk  to your care team if you may be pregnant. Serious birth defects can occur if you take this medication during pregnancy and for 5 months after the last dose. You will need a negative pregnancy test before starting this medication. Contraception is recommended while taking this medication and for 5 months after the last dose. Your care team  can help you find the option that works for you. What side effects may I notice from receiving this medication? Side effects that you should report to your care team as soon as possible: Allergic reactions--skin rash, itching, hives, swelling of the face, lips, tongue, or throat Bone, joint, or muscle pain Low calcium  level--muscle pain or cramps, confusion, tingling, or numbness in the hands or feet Osteonecrosis of the jaw--pain, swelling, or redness in the mouth, numbness of the jaw, poor healing after dental work, unusual discharge from the mouth, visible bones in the mouth Side effects that usually do not require medical attention (report to your care team if they continue or are bothersome): Cough Diarrhea Fatigue Headache Nausea This list may not describe all possible side effects. Call your doctor for medical advice about side effects. You may report side effects to FDA at 1-800-FDA-1088. Where should I keep my medication? This medication is given in a hospital or clinic. It will not be stored at home. NOTE: This sheet is a summary. It may not cover all possible information. If you have questions about this medicine, talk to your doctor, pharmacist, or health care provider.  2024 [employer] (2022-01-31 00:00:00)

## 2024-10-02 NOTE — Progress Notes (Signed)
 Treatment 6 weeks Hematology and Oncology Follow Up Visit  Shawn Bauer [facility] [id number] [birth year] 89 y.o. 10/02/2024   Principle Diagnosis:  IgG Kappa myeloma -- high risk due to t(4:21)    Prior Treatment:  Velcade /Decadron  -- started 10/09/2019, s/p cycle 34 - treatment frequency changed to every other week on 01/21/2021 now s/p cycle 20 --HOLD TREATMENT STARTING 07/29/2023  Current Therapy:        Observation Xgeva  120 mg IM q 3 months -- next dose on 12/2024       Interim History:  Shawn Bauer is here today for follow-up.  Unfortunately, the problem that he now has is that he has a fractured right ankle.  This happened in early December.  He has a walking boot on.  He did not want surgery.  It is now been 14 months since he has had any chemotherapy for the myeloma.  With his last myeloma studies, everything was holding pretty steady.  There was no monoclonal spike in his blood.  His IgG level was 934 mg/dL.  The Kappa light chain was 14.6 mg/dL.  He has had no problems with cough.  He has had no nausea or vomiting.  He has had no change in bowel or bladder habits.  He still has issues with neuropathy.  He has had no fever.  There is been no bleeding.  Overall, I would say that his performance status is probably ECOG 2.     Wt Readings from Last 3 Encounters:  10/02/24 130 lb (59 kg)  08/26/24 128 lb (58.1 kg)  07/22/24 136 lb 14.4 oz (62.1 kg)   Medications:  Allergies as of 10/02/2024   No Known Allergies      Medication List        Accurate as of October 02, 2024  3:05 PM. If you have any questions, ask your nurse or doctor.          amiodarone  200 MG tablet Commonly known as: PACERONE  Take 1 tablet (200 mg total) by mouth as directed. Take 200mg  daily Monday - Friday only.   atorvastatin  40 MG tablet Commonly known as: LIPITOR TAKE 1 TABLET BY MOUTH EVERY DAY   Breo Ellipta 200-25 MCG/ACT Aepb Generic drug: fluticasone furoate-vilanterol 1 puff daily.    CINNAMON PO Take 1,000 mg by mouth daily.   clopidogrel  75 MG tablet Commonly known as: PLAVIX  TAKE 1 TABLET BY MOUTH EVERY DAY WITH BREAKFAST   cyanocobalamin  1000 MCG tablet Take 1,000 mcg by mouth daily.   DULoxetine  30 MG capsule Commonly known as: CYMBALTA  TAKE 1 CAPSULE BY MOUTH 2 TIMES DAILY.   gabapentin  400 MG capsule Commonly known as: NEURONTIN  Take 1 capsule (400 mg total) by mouth at bedtime.   metoprolol  succinate 50 MG 24 hr tablet Commonly known as: TOPROL -XL Take 25 mg by mouth daily.   nitroGLYCERIN  0.4 MG SL tablet Commonly known as: NITROSTAT  Place 1 tablet (0.4 mg total) under the tongue every 5 (five) minutes as needed for chest pain.   olmesartan 40 MG tablet Commonly known as: BENICAR Take 40 mg by mouth in the morning.   polyethylene glycol 17 g packet Commonly known as: MIRALAX / GLYCOLAX Take 17 g by mouth daily.   promethazine  12.5 MG tablet Commonly known as: PHENERGAN  TAKE 1 TABLET (12.5 MG TOTAL) BY MOUTH EVERY 8 HOURS AS NEEDED FOR NAUSEA AND VOMITING   traMADol  50 MG tablet Commonly known as: ULTRAM  Take 50 mg by mouth 2 (two) times daily as  needed (for pain).   Vitamin B6 250 MG Tabs Take 2 tablets (500 mg total) by mouth daily after breakfast.        Allergies: No Known Allergies  Past Medical History, Surgical history, Social history, and Family History were reviewed and updated.  Review of Systems:  Review of Systems  Constitutional: Negative.   HENT: Negative.    Eyes: Negative.   Respiratory: Negative.    Cardiovascular: Negative.   Gastrointestinal: Negative.   Genitourinary: Negative.   Musculoskeletal: Negative.   Skin: Negative.   Neurological: Negative.   Endo/Heme/Allergies: Negative.   Psychiatric/Behavioral: Negative.       Physical Exam:  Vitals:   10/02/24 1500  BP: (!) 152/53  Pulse: 63  Resp: 16  Temp: 97.9 F (36.6 C)  SpO2: 100%     Wt Readings from Last 3 Encounters:  10/02/24  130 lb (59 kg)  08/26/24 128 lb (58.1 kg)  07/22/24 136 lb 14.4 oz (62.1 kg)   Physical Exam Vitals reviewed.  HENT:     Head: Normocephalic and atraumatic.  Eyes:     Pupils: Pupils are equal, round, and reactive to light.  Cardiovascular:     Rate and Rhythm: Normal rate and regular rhythm.     Heart sounds: Normal heart sounds.  Pulmonary:     Effort: Pulmonary effort is normal.     Breath sounds: Normal breath sounds.  Abdominal:     General: Bowel sounds are normal.     Palpations: Abdomen is soft.  Musculoskeletal:        General: No tenderness or deformity. Normal range of motion.     Cervical back: Normal range of motion.     Comments: He has a walking boot on the right foot.  Lymphadenopathy:     Cervical: No cervical adenopathy.  Skin:    General: Skin is warm and dry.     Findings: No erythema or rash.     Comments: There is a raised, scantly erythematic scaly round lesion of the left ventral forearm measuring roughly 1cm. No bleeding or discharge   Neurological:     Mental Status: He is alert and oriented to person, place, and time.  Psychiatric:        Behavior: Behavior normal.        Thought Content: Thought content normal.        Judgment: Judgment normal.      Lab Results  Component Value Date   WBC 7.4 10/02/2024   HGB 11.5 (L) 10/02/2024   HCT 36.4 (L) 10/02/2024   MCV 98.9 10/02/2024   PLT 186 10/02/2024   Lab Results  Component Value Date   FERRITIN 11 (L) 04/12/2022   IRON  36 (L) 04/12/2022   TIBC 330 04/12/2022   UIBC 294 04/12/2022   IRONPCTSAT 11 (L) 04/12/2022   Lab Results  Component Value Date   RETICCTPCT 1.9 04/12/2022   RBC 3.68 (L) 10/02/2024   Lab Results  Component Value Date   KPAFRELGTCHN 146.5 (H) 08/26/2024   LAMBDASER 14.9 08/26/2024   KAPLAMBRATIO 9.83 (H) 08/26/2024   Lab Results  Component Value Date   IGGSERUM 934 08/26/2024   IGA 276 08/26/2024   IGMSERUM 46 08/26/2024   Lab Results  Component Value  Date   TOTALPROTELP 5.7 (L) 08/26/2024   ALBUMINELP 2.9 08/26/2024   A1GS 0.3 08/26/2024   A2GS 0.8 08/26/2024   BETS 0.8 08/26/2024   GAMS 0.9 08/26/2024   MSPIKE Not Observed 08/26/2024  SPEI Comment 06/11/2023     Chemistry      Component Value Date/Time   NA 142 08/26/2024 0929   K 4.0 08/26/2024 0929   CL 107 08/26/2024 0929   CO2 25 08/26/2024 0929   BUN 19 08/26/2024 0929   CREATININE 1.66 (H) 08/26/2024 0929      Component Value Date/Time   CALCIUM  8.9 08/26/2024 0929   ALKPHOS 85 08/26/2024 0929   AST 23 08/26/2024 0929   ALT 15 08/26/2024 0929   BILITOT 0.5 08/26/2024 0929       Impression and Plan: Shawn Bauer is a very pleasant 89 yo caucasian gentleman with IgG kappa myeloma.    He has been off treatment now for about 14 months.  He has done incredibly well off treatment with significant improvement in his quality of life.   Now however, he has a broken right ankle.  He has recover from this.  I think he is doing pretty well in all honesty.  He will get Xgeva  today.  We will plan to get him back to see us  in about 6 weeks or so.  Hopefully, he will be off the foot boot.    Maude JONELLE Crease, MD 1/9/20263:05 PM

## 2024-10-05 LAB — KAPPA/LAMBDA LIGHT CHAINS
Kappa free light chain: 129.2 mg/L — ABNORMAL HIGH (ref 3.3–19.4)
Kappa, lambda light chain ratio: 9.1 — ABNORMAL HIGH (ref 0.26–1.65)
Lambda free light chains: 14.2 mg/L (ref 5.7–26.3)

## 2024-10-07 LAB — MULTIPLE MYELOMA PANEL, SERUM
Albumin SerPl Elph-Mcnc: 3.2 g/dL (ref 2.9–4.4)
Albumin/Glob SerPl: 1.2 (ref 0.7–1.7)
Alpha 1: 0.2 g/dL (ref 0.0–0.4)
Alpha2 Glob SerPl Elph-Mcnc: 0.7 g/dL (ref 0.4–1.0)
B-Globulin SerPl Elph-Mcnc: 0.8 g/dL (ref 0.7–1.3)
Gamma Glob SerPl Elph-Mcnc: 1 g/dL (ref 0.4–1.8)
Globulin, Total: 2.7 g/dL (ref 2.2–3.9)
IgA: 238 mg/dL (ref 61–437)
IgG (Immunoglobin G), Serum: 902 mg/dL (ref 603–1613)
IgM (Immunoglobulin M), Srm: 48 mg/dL (ref 15–143)
M Protein SerPl Elph-Mcnc: 0.2 g/dL — ABNORMAL HIGH
Total Protein ELP: 5.9 g/dL — ABNORMAL LOW (ref 6.0–8.5)

## 2024-10-21 ENCOUNTER — Ambulatory Visit: Admitting: Neurology

## 2024-11-12 ENCOUNTER — Inpatient Hospital Stay: Admitting: Medical Oncology

## 2024-11-12 ENCOUNTER — Inpatient Hospital Stay

## 2024-11-12 ENCOUNTER — Inpatient Hospital Stay: Attending: Family
